# Patient Record
Sex: Female | Born: 1985 | Race: White | Hispanic: No | Smoking: Current every day smoker
Health system: Southern US, Community
[De-identification: ages and names within clinical notes are randomized; demographics above are authoritative.]

## PROBLEM LIST (undated history)

## (undated) ENCOUNTER — Inpatient Hospital Stay (HOSPITAL_COMMUNITY): Payer: Self-pay

## (undated) DIAGNOSIS — K829 Disease of gallbladder, unspecified: Secondary | ICD-10-CM

## (undated) DIAGNOSIS — I10 Essential (primary) hypertension: Secondary | ICD-10-CM

## (undated) DIAGNOSIS — Z9889 Other specified postprocedural states: Secondary | ICD-10-CM

## (undated) DIAGNOSIS — B977 Papillomavirus as the cause of diseases classified elsewhere: Secondary | ICD-10-CM

## (undated) DIAGNOSIS — M2550 Pain in unspecified joint: Secondary | ICD-10-CM

## (undated) DIAGNOSIS — E282 Polycystic ovarian syndrome: Secondary | ICD-10-CM

## (undated) DIAGNOSIS — R7303 Prediabetes: Secondary | ICD-10-CM

## (undated) DIAGNOSIS — E559 Vitamin D deficiency, unspecified: Secondary | ICD-10-CM

## (undated) DIAGNOSIS — R0602 Shortness of breath: Secondary | ICD-10-CM

## (undated) DIAGNOSIS — M549 Dorsalgia, unspecified: Secondary | ICD-10-CM

## (undated) DIAGNOSIS — F32A Depression, unspecified: Secondary | ICD-10-CM

## (undated) DIAGNOSIS — O139 Gestational [pregnancy-induced] hypertension without significant proteinuria, unspecified trimester: Secondary | ICD-10-CM

## (undated) DIAGNOSIS — M1990 Unspecified osteoarthritis, unspecified site: Secondary | ICD-10-CM

## (undated) DIAGNOSIS — O10919 Unspecified pre-existing hypertension complicating pregnancy, unspecified trimester: Secondary | ICD-10-CM

## (undated) DIAGNOSIS — R112 Nausea with vomiting, unspecified: Secondary | ICD-10-CM

## (undated) DIAGNOSIS — F319 Bipolar disorder, unspecified: Secondary | ICD-10-CM

## (undated) DIAGNOSIS — F419 Anxiety disorder, unspecified: Secondary | ICD-10-CM

## (undated) DIAGNOSIS — L732 Hidradenitis suppurativa: Secondary | ICD-10-CM

## (undated) DIAGNOSIS — Z8489 Family history of other specified conditions: Secondary | ICD-10-CM

## (undated) DIAGNOSIS — F329 Major depressive disorder, single episode, unspecified: Secondary | ICD-10-CM

## (undated) DIAGNOSIS — K5900 Constipation, unspecified: Secondary | ICD-10-CM

## (undated) DIAGNOSIS — R1310 Dysphagia, unspecified: Secondary | ICD-10-CM

## (undated) HISTORY — DX: Prediabetes: R73.03

## (undated) HISTORY — DX: Dysphagia, unspecified: R13.10

## (undated) HISTORY — DX: Unspecified pre-existing hypertension complicating pregnancy, unspecified trimester: O10.919

## (undated) HISTORY — DX: Depression, unspecified: F32.A

## (undated) HISTORY — DX: Unspecified osteoarthritis, unspecified site: M19.90

## (undated) HISTORY — DX: Hidradenitis suppurativa: L73.2

## (undated) HISTORY — DX: Dorsalgia, unspecified: M54.9

## (undated) HISTORY — DX: Essential (primary) hypertension: I10

## (undated) HISTORY — DX: Shortness of breath: R06.02

## (undated) HISTORY — DX: Disease of gallbladder, unspecified: K82.9

## (undated) HISTORY — DX: Constipation, unspecified: K59.00

## (undated) HISTORY — DX: Vitamin D deficiency, unspecified: E55.9

## (undated) HISTORY — DX: Polycystic ovarian syndrome: E28.2

## (undated) HISTORY — DX: Major depressive disorder, single episode, unspecified: F32.9

## (undated) HISTORY — DX: Pain in unspecified joint: M25.50

## (undated) HISTORY — PX: CHOLECYSTECTOMY: SHX55

## (undated) HISTORY — DX: Morbid (severe) obesity due to excess calories: E66.01

## (undated) HISTORY — PX: EYE SURGERY: SHX253

---
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
## 2004-01-10 ENCOUNTER — Ambulatory Visit: Payer: Self-pay | Admitting: Obstetrics & Gynecology

## 2004-01-11 ENCOUNTER — Ambulatory Visit (HOSPITAL_COMMUNITY): Admission: RE | Admit: 2004-01-11 | Discharge: 2004-01-11 | Payer: Self-pay | Admitting: *Deleted

## 2004-01-24 ENCOUNTER — Ambulatory Visit: Payer: Self-pay | Admitting: Obstetrics & Gynecology

## 2004-02-21 ENCOUNTER — Ambulatory Visit: Payer: Self-pay | Admitting: Obstetrics & Gynecology

## 2004-03-04 DIAGNOSIS — E282 Polycystic ovarian syndrome: Secondary | ICD-10-CM

## 2004-03-08 ENCOUNTER — Ambulatory Visit: Payer: Self-pay | Admitting: Family Medicine

## 2004-03-22 ENCOUNTER — Inpatient Hospital Stay (HOSPITAL_COMMUNITY): Admission: AD | Admit: 2004-03-22 | Discharge: 2004-03-22 | Payer: Self-pay | Admitting: Obstetrics & Gynecology

## 2004-04-02 ENCOUNTER — Inpatient Hospital Stay (HOSPITAL_COMMUNITY): Admission: RE | Admit: 2004-04-02 | Discharge: 2004-04-02 | Payer: Self-pay | Admitting: Obstetrics and Gynecology

## 2004-04-03 ENCOUNTER — Inpatient Hospital Stay (HOSPITAL_COMMUNITY): Admission: AD | Admit: 2004-04-03 | Discharge: 2004-04-03 | Payer: Self-pay | Admitting: *Deleted

## 2004-06-20 ENCOUNTER — Emergency Department (HOSPITAL_COMMUNITY): Admission: EM | Admit: 2004-06-20 | Discharge: 2004-06-20 | Payer: Self-pay | Admitting: Emergency Medicine

## 2004-10-13 ENCOUNTER — Inpatient Hospital Stay (HOSPITAL_COMMUNITY): Admission: AD | Admit: 2004-10-13 | Discharge: 2004-10-13 | Payer: Self-pay | Admitting: Obstetrics

## 2004-12-24 ENCOUNTER — Inpatient Hospital Stay (HOSPITAL_COMMUNITY): Admission: AD | Admit: 2004-12-24 | Discharge: 2004-12-24 | Payer: Self-pay | Admitting: Obstetrics & Gynecology

## 2004-12-25 ENCOUNTER — Inpatient Hospital Stay (HOSPITAL_COMMUNITY): Admission: AD | Admit: 2004-12-25 | Discharge: 2004-12-29 | Payer: Self-pay | Admitting: Obstetrics

## 2005-01-30 ENCOUNTER — Ambulatory Visit (HOSPITAL_COMMUNITY): Admission: RE | Admit: 2005-01-30 | Discharge: 2005-01-30 | Payer: Self-pay | Admitting: Obstetrics & Gynecology

## 2005-02-23 ENCOUNTER — Inpatient Hospital Stay (HOSPITAL_COMMUNITY): Admission: AD | Admit: 2005-02-23 | Discharge: 2005-02-27 | Payer: Self-pay | Admitting: Obstetrics & Gynecology

## 2005-05-09 ENCOUNTER — Emergency Department (HOSPITAL_COMMUNITY): Admission: EM | Admit: 2005-05-09 | Discharge: 2005-05-09 | Payer: Self-pay | Admitting: Emergency Medicine

## 2005-05-11 ENCOUNTER — Emergency Department (HOSPITAL_COMMUNITY): Admission: EM | Admit: 2005-05-11 | Discharge: 2005-05-12 | Payer: Self-pay | Admitting: Emergency Medicine

## 2005-05-12 ENCOUNTER — Emergency Department (HOSPITAL_COMMUNITY): Admission: EM | Admit: 2005-05-12 | Discharge: 2005-05-13 | Payer: Self-pay | Admitting: Emergency Medicine

## 2005-05-27 ENCOUNTER — Emergency Department (HOSPITAL_COMMUNITY): Admission: EM | Admit: 2005-05-27 | Discharge: 2005-05-27 | Payer: Self-pay | Admitting: Family Medicine

## 2005-05-28 ENCOUNTER — Emergency Department (HOSPITAL_COMMUNITY): Admission: EM | Admit: 2005-05-28 | Discharge: 2005-05-28 | Payer: Self-pay | Admitting: Family Medicine

## 2005-07-04 ENCOUNTER — Inpatient Hospital Stay (HOSPITAL_COMMUNITY): Admission: AD | Admit: 2005-07-04 | Discharge: 2005-07-04 | Payer: Self-pay | Admitting: Obstetrics

## 2005-08-05 ENCOUNTER — Ambulatory Visit: Payer: Self-pay | Admitting: Family Medicine

## 2005-10-29 ENCOUNTER — Emergency Department (HOSPITAL_COMMUNITY): Admission: EM | Admit: 2005-10-29 | Discharge: 2005-10-29 | Payer: Self-pay | Admitting: Emergency Medicine

## 2006-03-14 ENCOUNTER — Emergency Department (HOSPITAL_COMMUNITY): Admission: EM | Admit: 2006-03-14 | Discharge: 2006-03-14 | Payer: Self-pay | Admitting: Emergency Medicine

## 2006-07-04 ENCOUNTER — Ambulatory Visit (HOSPITAL_COMMUNITY): Admission: RE | Admit: 2006-07-04 | Discharge: 2006-07-04 | Payer: Self-pay | Admitting: Emergency Medicine

## 2006-07-04 ENCOUNTER — Emergency Department (HOSPITAL_COMMUNITY): Admission: EM | Admit: 2006-07-04 | Discharge: 2006-07-04 | Payer: Self-pay | Admitting: Emergency Medicine

## 2006-07-05 ENCOUNTER — Inpatient Hospital Stay (HOSPITAL_COMMUNITY): Admission: EM | Admit: 2006-07-05 | Discharge: 2006-07-08 | Payer: Self-pay | Admitting: Family Medicine

## 2006-07-05 ENCOUNTER — Encounter (INDEPENDENT_AMBULATORY_CARE_PROVIDER_SITE_OTHER): Payer: Self-pay | Admitting: Specialist

## 2006-08-24 ENCOUNTER — Emergency Department (HOSPITAL_COMMUNITY): Admission: EM | Admit: 2006-08-24 | Discharge: 2006-08-25 | Payer: Self-pay | Admitting: Emergency Medicine

## 2006-10-03 ENCOUNTER — Ambulatory Visit: Payer: Self-pay | Admitting: Family Medicine

## 2006-10-03 LAB — CONVERTED CEMR LAB: Beta hcg, urine, semiquantitative: NEGATIVE

## 2006-10-04 ENCOUNTER — Inpatient Hospital Stay (HOSPITAL_COMMUNITY): Admission: AD | Admit: 2006-10-04 | Discharge: 2006-10-04 | Payer: Self-pay | Admitting: Obstetrics and Gynecology

## 2006-10-20 ENCOUNTER — Encounter (INDEPENDENT_AMBULATORY_CARE_PROVIDER_SITE_OTHER): Payer: Self-pay | Admitting: Family Medicine

## 2006-10-20 ENCOUNTER — Ambulatory Visit: Payer: Self-pay | Admitting: Sports Medicine

## 2006-10-20 DIAGNOSIS — F410 Panic disorder [episodic paroxysmal anxiety] without agoraphobia: Secondary | ICD-10-CM

## 2006-10-20 LAB — CONVERTED CEMR LAB
AST: 13 units/L (ref 0–37)
Albumin: 4.4 g/dL (ref 3.5–5.2)
Alkaline Phosphatase: 76 units/L (ref 39–117)
FSH: 5.3 milliintl units/mL
MCHC: 32.9 g/dL (ref 30.0–36.0)
Platelets: 249 10*3/uL (ref 150–400)
Potassium: 4.2 meq/L (ref 3.5–5.3)
RDW: 14.1 % — ABNORMAL HIGH (ref 11.5–14.0)
Sodium: 141 meq/L (ref 135–145)
Total Bilirubin: 0.4 mg/dL (ref 0.3–1.2)
Total Protein: 7.4 g/dL (ref 6.0–8.3)

## 2006-10-21 ENCOUNTER — Telehealth (INDEPENDENT_AMBULATORY_CARE_PROVIDER_SITE_OTHER): Payer: Self-pay | Admitting: Family Medicine

## 2006-10-23 ENCOUNTER — Emergency Department (HOSPITAL_COMMUNITY): Admission: EM | Admit: 2006-10-23 | Discharge: 2006-10-23 | Payer: Self-pay | Admitting: Emergency Medicine

## 2006-10-27 ENCOUNTER — Telehealth: Payer: Self-pay | Admitting: *Deleted

## 2006-10-28 ENCOUNTER — Ambulatory Visit: Payer: Self-pay | Admitting: Family Medicine

## 2006-10-28 ENCOUNTER — Encounter (INDEPENDENT_AMBULATORY_CARE_PROVIDER_SITE_OTHER): Payer: Self-pay | Admitting: *Deleted

## 2006-10-28 ENCOUNTER — Ambulatory Visit (HOSPITAL_COMMUNITY): Admission: RE | Admit: 2006-10-28 | Discharge: 2006-10-28 | Payer: Self-pay | Admitting: Family Medicine

## 2006-10-30 ENCOUNTER — Ambulatory Visit: Payer: Self-pay | Admitting: Family Medicine

## 2006-11-24 ENCOUNTER — Ambulatory Visit: Payer: Self-pay | Admitting: Sports Medicine

## 2006-12-05 ENCOUNTER — Emergency Department (HOSPITAL_COMMUNITY): Admission: EM | Admit: 2006-12-05 | Discharge: 2006-12-05 | Payer: Self-pay | Admitting: Family Medicine

## 2006-12-15 ENCOUNTER — Emergency Department (HOSPITAL_COMMUNITY): Admission: EM | Admit: 2006-12-15 | Discharge: 2006-12-15 | Payer: Self-pay | Admitting: Emergency Medicine

## 2006-12-16 ENCOUNTER — Ambulatory Visit: Payer: Self-pay | Admitting: Family Medicine

## 2006-12-16 ENCOUNTER — Telehealth (INDEPENDENT_AMBULATORY_CARE_PROVIDER_SITE_OTHER): Payer: Self-pay | Admitting: *Deleted

## 2006-12-16 ENCOUNTER — Encounter: Payer: Self-pay | Admitting: Family Medicine

## 2007-01-06 ENCOUNTER — Encounter: Payer: Self-pay | Admitting: *Deleted

## 2007-01-28 ENCOUNTER — Emergency Department (HOSPITAL_COMMUNITY): Admission: EM | Admit: 2007-01-28 | Discharge: 2007-01-28 | Payer: Self-pay | Admitting: Family Medicine

## 2007-02-02 ENCOUNTER — Emergency Department (HOSPITAL_COMMUNITY): Admission: EM | Admit: 2007-02-02 | Discharge: 2007-02-02 | Payer: Self-pay | Admitting: Emergency Medicine

## 2007-02-23 ENCOUNTER — Emergency Department (HOSPITAL_COMMUNITY): Admission: EM | Admit: 2007-02-23 | Discharge: 2007-02-23 | Payer: Self-pay | Admitting: Emergency Medicine

## 2007-03-25 ENCOUNTER — Emergency Department (HOSPITAL_COMMUNITY): Admission: EM | Admit: 2007-03-25 | Discharge: 2007-03-25 | Payer: Self-pay | Admitting: Emergency Medicine

## 2007-03-27 ENCOUNTER — Ambulatory Visit: Payer: Self-pay | Admitting: Family Medicine

## 2007-03-27 LAB — CONVERTED CEMR LAB: Beta hcg, urine, semiquantitative: NEGATIVE

## 2007-04-27 ENCOUNTER — Emergency Department (HOSPITAL_COMMUNITY): Admission: EM | Admit: 2007-04-27 | Discharge: 2007-04-27 | Payer: Self-pay | Admitting: Family Medicine

## 2007-04-28 ENCOUNTER — Telehealth: Payer: Self-pay | Admitting: *Deleted

## 2007-04-29 ENCOUNTER — Emergency Department (HOSPITAL_COMMUNITY): Admission: EM | Admit: 2007-04-29 | Discharge: 2007-04-29 | Payer: Self-pay | Admitting: Family Medicine

## 2007-04-30 ENCOUNTER — Ambulatory Visit: Payer: Self-pay | Admitting: Family Medicine

## 2007-04-30 ENCOUNTER — Telehealth: Payer: Self-pay | Admitting: *Deleted

## 2007-05-06 ENCOUNTER — Encounter (INDEPENDENT_AMBULATORY_CARE_PROVIDER_SITE_OTHER): Payer: Self-pay | Admitting: Family Medicine

## 2007-05-06 ENCOUNTER — Ambulatory Visit: Payer: Self-pay | Admitting: Family Medicine

## 2007-05-06 LAB — CONVERTED CEMR LAB
CO2: 26 meq/L (ref 19–32)
Glucose, Bld: 82 mg/dL (ref 70–99)
Potassium: 4.2 meq/L (ref 3.5–5.3)
Sodium: 141 meq/L (ref 135–145)

## 2007-05-07 ENCOUNTER — Encounter (INDEPENDENT_AMBULATORY_CARE_PROVIDER_SITE_OTHER): Payer: Self-pay | Admitting: Family Medicine

## 2007-05-27 ENCOUNTER — Ambulatory Visit: Payer: Self-pay | Admitting: Family Medicine

## 2007-05-27 LAB — CONVERTED CEMR LAB: Beta hcg, urine, semiquantitative: NEGATIVE

## 2007-06-01 ENCOUNTER — Ambulatory Visit: Payer: Self-pay | Admitting: Sports Medicine

## 2007-06-02 ENCOUNTER — Encounter (INDEPENDENT_AMBULATORY_CARE_PROVIDER_SITE_OTHER): Payer: Self-pay | Admitting: Family Medicine

## 2007-06-02 ENCOUNTER — Ambulatory Visit: Payer: Self-pay | Admitting: Family Medicine

## 2007-06-22 ENCOUNTER — Telehealth: Payer: Self-pay | Admitting: *Deleted

## 2007-06-26 ENCOUNTER — Encounter (INDEPENDENT_AMBULATORY_CARE_PROVIDER_SITE_OTHER): Payer: Self-pay | Admitting: Family Medicine

## 2007-06-26 ENCOUNTER — Ambulatory Visit: Payer: Self-pay | Admitting: Family Medicine

## 2007-07-03 ENCOUNTER — Emergency Department (HOSPITAL_COMMUNITY): Admission: EM | Admit: 2007-07-03 | Discharge: 2007-07-03 | Payer: Self-pay | Admitting: Family Medicine

## 2007-08-03 ENCOUNTER — Ambulatory Visit: Payer: Self-pay | Admitting: Family Medicine

## 2007-08-03 ENCOUNTER — Telehealth: Payer: Self-pay | Admitting: *Deleted

## 2007-08-12 ENCOUNTER — Ambulatory Visit: Payer: Self-pay | Admitting: Family Medicine

## 2007-08-12 LAB — CONVERTED CEMR LAB: Beta hcg, urine, semiquantitative: NEGATIVE

## 2007-08-14 ENCOUNTER — Encounter: Payer: Self-pay | Admitting: *Deleted

## 2007-08-20 ENCOUNTER — Ambulatory Visit: Payer: Self-pay | Admitting: Family Medicine

## 2007-08-27 ENCOUNTER — Ambulatory Visit: Payer: Self-pay | Admitting: Family Medicine

## 2007-08-27 ENCOUNTER — Encounter (INDEPENDENT_AMBULATORY_CARE_PROVIDER_SITE_OTHER): Payer: Self-pay | Admitting: Family Medicine

## 2007-08-27 LAB — CONVERTED CEMR LAB
Calcium: 9.3 mg/dL (ref 8.4–10.5)
Glucose, Bld: 72 mg/dL (ref 70–99)
Potassium: 3.8 meq/L (ref 3.5–5.3)
Sodium: 142 meq/L (ref 135–145)

## 2007-08-31 ENCOUNTER — Encounter (INDEPENDENT_AMBULATORY_CARE_PROVIDER_SITE_OTHER): Payer: Self-pay | Admitting: Family Medicine

## 2007-09-21 ENCOUNTER — Telehealth: Payer: Self-pay | Admitting: *Deleted

## 2007-09-22 ENCOUNTER — Ambulatory Visit: Payer: Self-pay | Admitting: Family Medicine

## 2007-09-22 LAB — CONVERTED CEMR LAB: Beta hcg, urine, semiquantitative: NEGATIVE

## 2007-10-10 ENCOUNTER — Emergency Department (HOSPITAL_COMMUNITY): Admission: EM | Admit: 2007-10-10 | Discharge: 2007-10-11 | Payer: Self-pay | Admitting: Emergency Medicine

## 2007-10-12 ENCOUNTER — Encounter: Payer: Self-pay | Admitting: Family Medicine

## 2007-10-12 ENCOUNTER — Ambulatory Visit: Payer: Self-pay | Admitting: Family Medicine

## 2007-10-12 ENCOUNTER — Telehealth: Payer: Self-pay | Admitting: *Deleted

## 2007-10-12 LAB — CONVERTED CEMR LAB
Bilirubin Urine: NEGATIVE
Blood in Urine, dipstick: NEGATIVE
Ketones, urine, test strip: NEGATIVE
Nitrite: NEGATIVE
Specific Gravity, Urine: >=1.03
pH: 6

## 2007-10-13 LAB — CONVERTED CEMR LAB
ALT: 16 U/L
AST: 15 U/L
Albumin: 4.3 g/dL
Alkaline Phosphatase: 57 U/L
BUN: 11 mg/dL
CO2: 24 meq/L
Calcium: 9.2 mg/dL
Chloride: 106 meq/L
Creatinine, Ser: 0.71 mg/dL
Glucose, Bld: 80 mg/dL
Potassium: 3.9 meq/L
Sodium: 141 meq/L
Total Bilirubin: 0.3 mg/dL
Total Protein: 7.2 g/dL

## 2007-10-19 ENCOUNTER — Ambulatory Visit: Payer: Self-pay | Admitting: Family Medicine

## 2007-10-19 ENCOUNTER — Telehealth: Payer: Self-pay | Admitting: *Deleted

## 2007-10-26 ENCOUNTER — Encounter: Payer: Self-pay | Admitting: *Deleted

## 2007-11-07 ENCOUNTER — Emergency Department (HOSPITAL_COMMUNITY): Admission: EM | Admit: 2007-11-07 | Discharge: 2007-11-07 | Payer: Self-pay | Admitting: Emergency Medicine

## 2007-11-13 ENCOUNTER — Ambulatory Visit: Payer: Self-pay | Admitting: Family Medicine

## 2007-12-09 ENCOUNTER — Telehealth (INDEPENDENT_AMBULATORY_CARE_PROVIDER_SITE_OTHER): Payer: Self-pay | Admitting: *Deleted

## 2007-12-11 ENCOUNTER — Encounter: Payer: Self-pay | Admitting: *Deleted

## 2007-12-29 ENCOUNTER — Ambulatory Visit: Payer: Self-pay | Admitting: Family Medicine

## 2007-12-29 ENCOUNTER — Telehealth: Payer: Self-pay | Admitting: *Deleted

## 2007-12-29 LAB — CONVERTED CEMR LAB: Beta hcg, urine, semiquantitative: NEGATIVE

## 2008-03-07 ENCOUNTER — Encounter (INDEPENDENT_AMBULATORY_CARE_PROVIDER_SITE_OTHER): Payer: Self-pay | Admitting: Family Medicine

## 2008-03-07 ENCOUNTER — Ambulatory Visit: Payer: Self-pay | Admitting: Family Medicine

## 2008-03-07 LAB — CONVERTED CEMR LAB
ALT: 13 units/L (ref 0–35)
AST: 12 units/L (ref 0–37)
CO2: 23 meq/L (ref 19–32)
Calcium: 9.6 mg/dL (ref 8.4–10.5)
Chloride: 105 meq/L (ref 96–112)
Cholesterol: 125 mg/dL (ref 0–200)
Creatinine, Ser: 0.79 mg/dL (ref 0.40–1.20)
Potassium: 4.6 meq/L (ref 3.5–5.3)
Sodium: 140 meq/L (ref 135–145)
Total CHOL/HDL Ratio: 3.9
Total Protein: 7.5 g/dL (ref 6.0–8.3)

## 2008-03-08 ENCOUNTER — Encounter (INDEPENDENT_AMBULATORY_CARE_PROVIDER_SITE_OTHER): Payer: Self-pay | Admitting: Family Medicine

## 2008-03-21 ENCOUNTER — Telehealth (INDEPENDENT_AMBULATORY_CARE_PROVIDER_SITE_OTHER): Payer: Self-pay | Admitting: Family Medicine

## 2008-03-27 ENCOUNTER — Emergency Department (HOSPITAL_COMMUNITY): Admission: EM | Admit: 2008-03-27 | Discharge: 2008-03-27 | Payer: Self-pay | Admitting: Emergency Medicine

## 2008-04-07 ENCOUNTER — Telehealth: Payer: Self-pay | Admitting: *Deleted

## 2008-05-08 ENCOUNTER — Emergency Department (HOSPITAL_COMMUNITY): Admission: EM | Admit: 2008-05-08 | Discharge: 2008-05-09 | Payer: Self-pay | Admitting: Emergency Medicine

## 2008-05-09 ENCOUNTER — Ambulatory Visit (HOSPITAL_COMMUNITY): Admission: RE | Admit: 2008-05-09 | Discharge: 2008-05-09 | Payer: Self-pay | Admitting: Emergency Medicine

## 2008-05-10 ENCOUNTER — Telehealth (INDEPENDENT_AMBULATORY_CARE_PROVIDER_SITE_OTHER): Payer: Self-pay | Admitting: Family Medicine

## 2008-05-10 ENCOUNTER — Ambulatory Visit: Payer: Self-pay | Admitting: Family Medicine

## 2008-05-10 ENCOUNTER — Encounter: Payer: Self-pay | Admitting: Family Medicine

## 2008-05-10 ENCOUNTER — Encounter: Admission: RE | Admit: 2008-05-10 | Discharge: 2008-05-10 | Payer: Self-pay | Admitting: Family Medicine

## 2008-05-10 LAB — CONVERTED CEMR LAB
Band Neutrophils: 0 % (ref 0–10)
Basophils Absolute: 0 10*3/uL (ref 0.0–0.1)
Beta hcg, urine, semiquantitative: NEGATIVE
HCT: 43 % (ref 36.0–46.0)
Lymphocytes Relative: 23 % (ref 12–46)
Lymphs Abs: 1.9 10*3/uL (ref 0.7–4.0)
MCV: 85.8 fL (ref 78.0–100.0)
Monocytes Absolute: 0.4 10*3/uL (ref 0.1–1.0)
Neutro Abs: 5.7 10*3/uL (ref 1.7–7.7)
Platelets: 232 10*3/uL (ref 150–400)
RBC: 5.01 M/uL (ref 3.87–5.11)
WBC: 8.2 10*3/uL (ref 4.0–10.5)

## 2008-05-12 ENCOUNTER — Encounter: Payer: Self-pay | Admitting: Family Medicine

## 2008-05-13 ENCOUNTER — Ambulatory Visit: Payer: Self-pay | Admitting: Family Medicine

## 2008-05-13 ENCOUNTER — Encounter (INDEPENDENT_AMBULATORY_CARE_PROVIDER_SITE_OTHER): Payer: Self-pay | Admitting: Family Medicine

## 2008-05-13 ENCOUNTER — Telehealth (INDEPENDENT_AMBULATORY_CARE_PROVIDER_SITE_OTHER): Payer: Self-pay | Admitting: Family Medicine

## 2008-05-13 LAB — CONVERTED CEMR LAB
CMV IgM: <8 (ref <30.0)
EBV VCA IgG: 0.03
EBV VCA IgM: 0.33
Glucose, Urine, Semiquant: NEGATIVE
Heterophile Ab Screen: NEGATIVE
Nitrite: NEGATIVE
Specific Gravity, Urine: 1.02
pH: 7

## 2008-05-15 ENCOUNTER — Telehealth: Payer: Self-pay | Admitting: Family Medicine

## 2008-05-17 ENCOUNTER — Ambulatory Visit: Payer: Self-pay | Admitting: Family Medicine

## 2008-05-17 ENCOUNTER — Encounter (INDEPENDENT_AMBULATORY_CARE_PROVIDER_SITE_OTHER): Payer: Self-pay | Admitting: Family Medicine

## 2008-05-17 LAB — CONVERTED CEMR LAB
Basophils Relative: 1 % (ref 0–1)
Lymphocytes Relative: 25 % (ref 12–46)
Lymphs Abs: 2.1 10*3/uL (ref 0.7–4.0)
MCHC: 33.6 g/dL (ref 30.0–36.0)
Monocytes Relative: 5 % (ref 3–12)
Neutro Abs: 5.5 10*3/uL (ref 1.7–7.7)
Neutrophils Relative %: 66 % (ref 43–77)
RBC: 5.16 M/uL — ABNORMAL HIGH (ref 3.87–5.11)
WBC: 8.2 10*3/uL (ref 4.0–10.5)

## 2008-05-28 ENCOUNTER — Emergency Department (HOSPITAL_COMMUNITY): Admission: EM | Admit: 2008-05-28 | Discharge: 2008-05-28 | Payer: Self-pay | Admitting: Emergency Medicine

## 2008-06-01 ENCOUNTER — Encounter (INDEPENDENT_AMBULATORY_CARE_PROVIDER_SITE_OTHER): Payer: Self-pay | Admitting: Family Medicine

## 2008-06-01 ENCOUNTER — Ambulatory Visit: Payer: Self-pay | Admitting: Family Medicine

## 2008-06-01 LAB — CONVERTED CEMR LAB
Basophils Relative: 0 % (ref 0–1)
Lymphocytes Relative: 22 % (ref 12–46)
Lymphs Abs: 2.4 10*3/uL (ref 0.7–4.0)
Monocytes Relative: 5 % (ref 3–12)
Neutro Abs: 7.9 10*3/uL — ABNORMAL HIGH (ref 1.7–7.7)
Neutrophils Relative %: 71 % (ref 43–77)
RBC: 5.35 M/uL — ABNORMAL HIGH (ref 3.87–5.11)
WBC: 11.1 10*3/uL — ABNORMAL HIGH (ref 4.0–10.5)

## 2008-06-02 ENCOUNTER — Telehealth: Payer: Self-pay | Admitting: *Deleted

## 2008-06-07 ENCOUNTER — Ambulatory Visit: Payer: Self-pay | Admitting: Oncology

## 2008-06-08 ENCOUNTER — Encounter (INDEPENDENT_AMBULATORY_CARE_PROVIDER_SITE_OTHER): Payer: Self-pay | Admitting: Family Medicine

## 2008-06-08 ENCOUNTER — Ambulatory Visit: Payer: Self-pay | Admitting: Family Medicine

## 2008-06-08 LAB — CONVERTED CEMR LAB
Eosinophils Relative: 3 % (ref 0–5)
HCT: 41.4 % (ref 36.0–46.0)
Hemoglobin: 13.7 g/dL (ref 12.0–15.0)
Lymphocytes Relative: 23 % (ref 12–46)
Lymphs Abs: 2.6 10*3/uL (ref 0.7–4.0)
Platelets: 233 10*3/uL (ref 150–400)
RBC: 5.18 M/uL — ABNORMAL HIGH (ref 3.87–5.11)
WBC: 11.5 10*3/uL — ABNORMAL HIGH (ref 4.0–10.5)

## 2008-06-09 ENCOUNTER — Encounter: Payer: Self-pay | Admitting: *Deleted

## 2008-06-14 ENCOUNTER — Telehealth (INDEPENDENT_AMBULATORY_CARE_PROVIDER_SITE_OTHER): Payer: Self-pay | Admitting: Family Medicine

## 2008-06-15 ENCOUNTER — Ambulatory Visit: Payer: Self-pay | Admitting: Family Medicine

## 2008-06-15 ENCOUNTER — Encounter (INDEPENDENT_AMBULATORY_CARE_PROVIDER_SITE_OTHER): Payer: Self-pay | Admitting: Family Medicine

## 2008-06-16 ENCOUNTER — Emergency Department (HOSPITAL_COMMUNITY): Admission: EM | Admit: 2008-06-16 | Discharge: 2008-06-17 | Payer: Self-pay | Admitting: Emergency Medicine

## 2008-06-17 LAB — CONVERTED CEMR LAB
ALT: 15 units/L (ref 0–35)
CO2: 23 meq/L (ref 19–32)
Calcium: 9 mg/dL (ref 8.4–10.5)
Chloride: 109 meq/L (ref 96–112)
Lipase: 27 units/L (ref 0–75)
Lymphs Abs: 1.8 10*3/uL (ref 0.7–4.0)
MCV: 81 fL (ref 78.0–100.0)
Monocytes Relative: 6 % (ref 3–12)
Neutro Abs: 5.8 10*3/uL (ref 1.7–7.7)
Neutrophils Relative %: 70 % (ref 43–77)
Potassium: 4.4 meq/L (ref 3.5–5.3)
RBC: 5.11 M/uL (ref 3.87–5.11)
Sodium: 141 meq/L (ref 135–145)
Total Protein: 6.8 g/dL (ref 6.0–8.3)
WBC: 8.3 10*3/uL (ref 4.0–10.5)

## 2008-06-20 ENCOUNTER — Telehealth (INDEPENDENT_AMBULATORY_CARE_PROVIDER_SITE_OTHER): Payer: Self-pay | Admitting: Family Medicine

## 2008-06-23 ENCOUNTER — Ambulatory Visit: Payer: Self-pay | Admitting: Family Medicine

## 2008-06-28 ENCOUNTER — Encounter (INDEPENDENT_AMBULATORY_CARE_PROVIDER_SITE_OTHER): Payer: Self-pay | Admitting: Family Medicine

## 2008-06-28 LAB — COMPREHENSIVE METABOLIC PANEL
BUN: 10 mg/dL (ref 6–23)
CO2: 30 mEq/L (ref 19–32)
Calcium: 9.5 mg/dL (ref 8.4–10.5)
Chloride: 106 mEq/L (ref 96–112)
Creatinine, Ser: 0.74 mg/dL (ref 0.40–1.20)
Glucose, Bld: 72 mg/dL (ref 70–99)
Total Bilirubin: 0.5 mg/dL (ref 0.3–1.2)

## 2008-06-28 LAB — CBC & DIFF AND RETIC
BASO%: 0.4 % (ref 0.0–2.0)
Basophils Absolute: 0 10*3/uL (ref 0.0–0.1)
EOS%: 1.9 % (ref 0.0–7.0)
HCT: 42.1 % (ref 34.8–46.6)
LYMPH%: 19.2 % (ref 14.0–49.7)
MCH: 27.5 pg (ref 25.1–34.0)
MCHC: 34.7 g/dL (ref 31.5–36.0)
MCV: 79.3 fL — ABNORMAL LOW (ref 79.5–101.0)
MONO%: 4.1 % (ref 0.0–14.0)
NEUT%: 74.4 % (ref 38.4–76.8)
lymph#: 2.4 10*3/uL (ref 0.9–3.3)

## 2008-06-28 LAB — HAPTOGLOBIN: Haptoglobin: 209 mg/dL — ABNORMAL HIGH (ref 16–200)

## 2008-06-28 LAB — CHCC SMEAR

## 2008-06-28 LAB — LACTATE DEHYDROGENASE: LDH: 127 U/L (ref 94–250)

## 2008-06-29 ENCOUNTER — Telehealth: Payer: Self-pay | Admitting: Family Medicine

## 2008-06-30 ENCOUNTER — Encounter (INDEPENDENT_AMBULATORY_CARE_PROVIDER_SITE_OTHER): Payer: Self-pay | Admitting: Family Medicine

## 2008-06-30 ENCOUNTER — Ambulatory Visit: Payer: Self-pay | Admitting: Family Medicine

## 2008-06-30 LAB — CONVERTED CEMR LAB: Crystals, Fluid: NONE SEEN

## 2008-07-03 ENCOUNTER — Telehealth: Payer: Self-pay | Admitting: Family Medicine

## 2008-07-03 ENCOUNTER — Emergency Department (HOSPITAL_COMMUNITY): Admission: EM | Admit: 2008-07-03 | Discharge: 2008-07-03 | Payer: Self-pay | Admitting: Emergency Medicine

## 2008-07-05 ENCOUNTER — Ambulatory Visit: Payer: Self-pay | Admitting: Family Medicine

## 2008-07-05 LAB — CONVERTED CEMR LAB
Beta hcg, urine, semiquantitative: NEGATIVE
Bilirubin Urine: NEGATIVE
Ketones, urine, test strip: NEGATIVE
Nitrite: NEGATIVE
Specific Gravity, Urine: 1.025

## 2008-07-06 ENCOUNTER — Encounter: Admission: RE | Admit: 2008-07-06 | Discharge: 2008-07-06 | Payer: Self-pay | Admitting: Family Medicine

## 2008-07-07 ENCOUNTER — Telehealth (INDEPENDENT_AMBULATORY_CARE_PROVIDER_SITE_OTHER): Payer: Self-pay | Admitting: Family Medicine

## 2008-07-20 ENCOUNTER — Ambulatory Visit: Payer: Self-pay | Admitting: Family Medicine

## 2008-07-20 ENCOUNTER — Encounter (INDEPENDENT_AMBULATORY_CARE_PROVIDER_SITE_OTHER): Payer: Self-pay | Admitting: Family Medicine

## 2008-07-20 ENCOUNTER — Other Ambulatory Visit: Admission: RE | Admit: 2008-07-20 | Discharge: 2008-07-20 | Payer: Self-pay | Admitting: Family Medicine

## 2008-07-20 LAB — CONVERTED CEMR LAB
Beta hcg, urine, semiquantitative: NEGATIVE
Chlamydia, DNA Probe: NEGATIVE
GC Probe Amp, Genital: NEGATIVE
TSH: 2.366 microintl units/mL (ref 0.350–4.500)
Testosterone: 52.55 ng/dL (ref 10–70)

## 2008-07-25 ENCOUNTER — Telehealth: Payer: Self-pay | Admitting: *Deleted

## 2008-07-25 ENCOUNTER — Encounter (INDEPENDENT_AMBULATORY_CARE_PROVIDER_SITE_OTHER): Payer: Self-pay | Admitting: Family Medicine

## 2008-07-26 ENCOUNTER — Telehealth (INDEPENDENT_AMBULATORY_CARE_PROVIDER_SITE_OTHER): Payer: Self-pay | Admitting: Family Medicine

## 2008-07-27 ENCOUNTER — Ambulatory Visit: Payer: Self-pay | Admitting: Family Medicine

## 2008-07-27 DIAGNOSIS — F450 Somatization disorder: Secondary | ICD-10-CM

## 2008-07-27 LAB — CONVERTED CEMR LAB
Bilirubin Urine: NEGATIVE
Hgb A1c MFr Bld: 4.8 %
Urobilinogen, UA: 0.2

## 2008-08-26 ENCOUNTER — Ambulatory Visit: Payer: Self-pay | Admitting: Family Medicine

## 2008-08-30 ENCOUNTER — Encounter: Payer: Self-pay | Admitting: Family Medicine

## 2008-09-23 ENCOUNTER — Ambulatory Visit: Payer: Self-pay | Admitting: Family Medicine

## 2008-09-23 LAB — CONVERTED CEMR LAB: Beta hcg, urine, semiquantitative: NEGATIVE

## 2008-09-27 ENCOUNTER — Ambulatory Visit: Payer: Self-pay | Admitting: Oncology

## 2008-10-03 ENCOUNTER — Ambulatory Visit: Payer: Self-pay | Admitting: Family Medicine

## 2008-10-03 ENCOUNTER — Telehealth: Payer: Self-pay | Admitting: Family Medicine

## 2008-10-25 ENCOUNTER — Encounter: Payer: Self-pay | Admitting: Family Medicine

## 2008-10-25 ENCOUNTER — Ambulatory Visit: Payer: Self-pay | Admitting: Family Medicine

## 2008-10-27 ENCOUNTER — Telehealth: Payer: Self-pay | Admitting: *Deleted

## 2008-10-27 LAB — CONVERTED CEMR LAB
Platelets: 259 10*3/uL (ref 150–400)
RDW: 14 % (ref 11.5–15.5)
WBC: 13.1 10*3/uL — ABNORMAL HIGH (ref 4.0–10.5)

## 2008-10-28 ENCOUNTER — Emergency Department (HOSPITAL_COMMUNITY): Admission: EM | Admit: 2008-10-28 | Discharge: 2008-10-28 | Payer: Self-pay | Admitting: Emergency Medicine

## 2008-11-02 ENCOUNTER — Emergency Department (HOSPITAL_COMMUNITY): Admission: EM | Admit: 2008-11-02 | Discharge: 2008-11-03 | Payer: Self-pay | Admitting: Emergency Medicine

## 2008-11-22 ENCOUNTER — Ambulatory Visit: Payer: Self-pay | Admitting: Family Medicine

## 2008-11-22 ENCOUNTER — Telehealth: Payer: Self-pay | Admitting: Family Medicine

## 2008-11-23 ENCOUNTER — Emergency Department (HOSPITAL_COMMUNITY): Admission: EM | Admit: 2008-11-23 | Discharge: 2008-11-23 | Payer: Self-pay | Admitting: Emergency Medicine

## 2008-11-24 ENCOUNTER — Ambulatory Visit: Payer: Self-pay | Admitting: Family Medicine

## 2008-11-24 ENCOUNTER — Ambulatory Visit (HOSPITAL_COMMUNITY): Admission: RE | Admit: 2008-11-24 | Discharge: 2008-11-24 | Payer: Self-pay | Admitting: Family Medicine

## 2008-11-24 ENCOUNTER — Encounter: Payer: Self-pay | Admitting: Family Medicine

## 2008-12-02 ENCOUNTER — Encounter: Payer: Self-pay | Admitting: Family Medicine

## 2008-12-18 ENCOUNTER — Emergency Department (HOSPITAL_COMMUNITY): Admission: EM | Admit: 2008-12-18 | Discharge: 2008-12-18 | Payer: Self-pay | Admitting: Emergency Medicine

## 2009-02-01 ENCOUNTER — Encounter (INDEPENDENT_AMBULATORY_CARE_PROVIDER_SITE_OTHER): Payer: Self-pay

## 2009-02-15 ENCOUNTER — Emergency Department (HOSPITAL_COMMUNITY): Admission: EM | Admit: 2009-02-15 | Discharge: 2009-02-15 | Payer: Self-pay | Admitting: Emergency Medicine

## 2009-02-17 ENCOUNTER — Encounter: Payer: Self-pay | Admitting: Family Medicine

## 2009-02-17 ENCOUNTER — Ambulatory Visit: Payer: Self-pay | Admitting: Family Medicine

## 2009-02-17 LAB — CONVERTED CEMR LAB
ALT: 102 units/L — ABNORMAL HIGH (ref 0–35)
BUN: 11 mg/dL (ref 6–23)
Basophils Relative: 4 % — ABNORMAL HIGH (ref 0–1)
Bilirubin Urine: NEGATIVE
Blood in Urine, dipstick: NEGATIVE
CO2: 22 meq/L (ref 19–32)
Calcium: 9 mg/dL (ref 8.4–10.5)
Chloride: 105 meq/L (ref 96–112)
Creatinine, Ser: 0.85 mg/dL (ref 0.40–1.20)
Eosinophils Absolute: 0.1 10*3/uL (ref 0.0–0.7)
Eosinophils Relative: 1 % (ref 0–5)
Glucose, Urine, Semiquant: NEGATIVE
HCT: 41.9 % (ref 36.0–46.0)
Ketones, urine, test strip: NEGATIVE
Lymphs Abs: 6 10*3/uL — ABNORMAL HIGH (ref 0.7–4.0)
MCHC: 32.9 g/dL (ref 30.0–36.0)
MCV: 83.3 fL (ref 78.0–100.0)
Monocytes Relative: 14 % — ABNORMAL HIGH (ref 3–12)
Neutrophils Relative %: 24 % — ABNORMAL LOW (ref 43–77)
Platelets: 163 10*3/uL (ref 150–400)
Total Bilirubin: 0.5 mg/dL (ref 0.3–1.2)
pH: 6

## 2009-02-27 ENCOUNTER — Ambulatory Visit: Payer: Self-pay | Admitting: Family Medicine

## 2009-02-27 LAB — CONVERTED CEMR LAB
Albumin: 4.5 g/dL (ref 3.5–5.2)
BUN: 9 mg/dL (ref 6–23)
CO2: 24 meq/L (ref 19–32)
Calcium: 9.3 mg/dL (ref 8.4–10.5)
Chloride: 107 meq/L (ref 96–112)
Glucose, Bld: 81 mg/dL (ref 70–99)
HCV Ab: NEGATIVE
Hemoglobin: 14.3 g/dL (ref 12.0–15.0)
Hepatitis B Surface Ag: NEGATIVE
Potassium: 4.1 meq/L (ref 3.5–5.3)
RBC: 5.18 M/uL — ABNORMAL HIGH (ref 3.87–5.11)
Rapid Strep: NEGATIVE
Sodium: 142 meq/L (ref 135–145)
TSH: 2.342 microintl units/mL (ref 0.350–4.500)
Total Protein: 7.3 g/dL (ref 6.0–8.3)
WBC: 7.2 10*3/uL (ref 4.0–10.5)

## 2009-02-28 ENCOUNTER — Telehealth: Payer: Self-pay | Admitting: Family Medicine

## 2009-03-06 ENCOUNTER — Encounter (INDEPENDENT_AMBULATORY_CARE_PROVIDER_SITE_OTHER): Payer: Self-pay | Admitting: *Deleted

## 2009-03-06 DIAGNOSIS — Z720 Tobacco use: Secondary | ICD-10-CM | POA: Insufficient documentation

## 2009-03-06 DIAGNOSIS — F17200 Nicotine dependence, unspecified, uncomplicated: Secondary | ICD-10-CM

## 2009-03-19 ENCOUNTER — Telehealth: Payer: Self-pay | Admitting: Family Medicine

## 2009-04-06 ENCOUNTER — Telehealth: Payer: Self-pay | Admitting: Family Medicine

## 2009-05-09 ENCOUNTER — Telehealth: Payer: Self-pay | Admitting: *Deleted

## 2009-06-02 ENCOUNTER — Telehealth: Payer: Self-pay | Admitting: Family Medicine

## 2009-06-05 ENCOUNTER — Encounter: Payer: Self-pay | Admitting: *Deleted

## 2009-10-19 ENCOUNTER — Ambulatory Visit: Payer: Self-pay | Admitting: Family Medicine

## 2009-10-19 ENCOUNTER — Encounter: Payer: Self-pay | Admitting: Family Medicine

## 2009-10-19 ENCOUNTER — Inpatient Hospital Stay (HOSPITAL_COMMUNITY)
Admission: AD | Admit: 2009-10-19 | Discharge: 2009-10-19 | Payer: Self-pay | Source: Home / Self Care | Admitting: Family Medicine

## 2009-10-19 LAB — CONVERTED CEMR LAB
GC Probe Amp, Genital: NEGATIVE
Glucose, Urine, Semiquant: NEGATIVE
Specific Gravity, Urine: >=1.03
Urobilinogen, UA: 0.2
pH: 5.5

## 2009-10-26 ENCOUNTER — Encounter: Payer: Self-pay | Admitting: Family Medicine

## 2009-10-27 ENCOUNTER — Telehealth: Payer: Self-pay | Admitting: Family Medicine

## 2009-10-28 ENCOUNTER — Ambulatory Visit: Payer: Self-pay | Admitting: Advanced Practice Midwife

## 2009-10-28 ENCOUNTER — Inpatient Hospital Stay (HOSPITAL_COMMUNITY)
Admission: AD | Admit: 2009-10-28 | Discharge: 2009-10-28 | Payer: Self-pay | Source: Home / Self Care | Admitting: Obstetrics & Gynecology

## 2009-11-07 ENCOUNTER — Ambulatory Visit: Payer: Self-pay | Admitting: Family Medicine

## 2009-11-08 ENCOUNTER — Encounter: Payer: Self-pay | Admitting: Family Medicine

## 2009-11-08 ENCOUNTER — Ambulatory Visit: Payer: Self-pay | Admitting: Family Medicine

## 2009-11-09 ENCOUNTER — Telehealth: Payer: Self-pay | Admitting: Family Medicine

## 2009-11-21 ENCOUNTER — Ambulatory Visit: Payer: Self-pay | Admitting: Family Medicine

## 2009-11-21 ENCOUNTER — Encounter: Payer: Self-pay | Admitting: Family Medicine

## 2009-11-21 LAB — CONVERTED CEMR LAB
HCT: 39.6 % (ref 36.0–46.0)
Hemoglobin: 13.8 g/dL (ref 12.0–15.0)
MCHC: 34.8 g/dL (ref 30.0–36.0)
Platelets: 185 10*3/uL (ref 150–400)
RDW: 14.1 % (ref 11.5–15.5)

## 2009-12-06 ENCOUNTER — Ambulatory Visit: Payer: Self-pay | Admitting: Family Medicine

## 2009-12-15 ENCOUNTER — Telehealth: Payer: Self-pay | Admitting: Family Medicine

## 2009-12-21 ENCOUNTER — Ambulatory Visit: Payer: Self-pay | Admitting: Family Medicine

## 2009-12-21 ENCOUNTER — Encounter: Payer: Self-pay | Admitting: Family Medicine

## 2009-12-21 DIAGNOSIS — R030 Elevated blood-pressure reading, without diagnosis of hypertension: Secondary | ICD-10-CM | POA: Insufficient documentation

## 2009-12-21 LAB — CONVERTED CEMR LAB
BUN: 4 mg/dL — ABNORMAL LOW (ref 6–23)
CO2: 21 meq/L (ref 19–32)
Calcium: 8.8 mg/dL (ref 8.4–10.5)
Chloride: 108 meq/L (ref 96–112)
Creatinine, Ser: 0.47 mg/dL (ref 0.40–1.20)
HCT: 37.8 % (ref 36.0–46.0)
Hemoglobin: 13.4 g/dL (ref 12.0–15.0)
Protein, U semiquant: 30
RBC: 4.38 M/uL (ref 3.87–5.11)
Specific Gravity, Urine: >=1.03
Urobilinogen, UA: 1
WBC: 9.1 10*3/uL (ref 4.0–10.5)

## 2009-12-25 ENCOUNTER — Telehealth: Payer: Self-pay | Admitting: Family Medicine

## 2009-12-28 ENCOUNTER — Ambulatory Visit: Payer: Self-pay | Admitting: Family Medicine

## 2009-12-28 DIAGNOSIS — N72 Inflammatory disease of cervix uteri: Secondary | ICD-10-CM | POA: Insufficient documentation

## 2009-12-28 LAB — CONVERTED CEMR LAB: Chlamydia, DNA Probe: NEGATIVE

## 2009-12-29 ENCOUNTER — Encounter: Payer: Self-pay | Admitting: Family Medicine

## 2010-01-04 ENCOUNTER — Ambulatory Visit: Payer: Self-pay | Admitting: Family Medicine

## 2010-01-17 ENCOUNTER — Encounter: Payer: Self-pay | Admitting: Family Medicine

## 2010-01-17 ENCOUNTER — Ambulatory Visit: Payer: Self-pay | Admitting: Family Medicine

## 2010-01-18 ENCOUNTER — Observation Stay (HOSPITAL_COMMUNITY): Admission: RE | Admit: 2010-01-18 | Discharge: 2010-01-18 | Payer: Self-pay | Admitting: Obstetrics & Gynecology

## 2010-01-22 ENCOUNTER — Ambulatory Visit: Payer: Self-pay | Admitting: Family Medicine

## 2010-01-22 ENCOUNTER — Inpatient Hospital Stay (HOSPITAL_COMMUNITY)
Admission: AD | Admit: 2010-01-22 | Discharge: 2010-01-22 | Payer: Self-pay | Source: Home / Self Care | Admitting: Obstetrics & Gynecology

## 2010-01-24 ENCOUNTER — Telehealth: Payer: Self-pay | Admitting: *Deleted

## 2010-01-29 ENCOUNTER — Telehealth: Payer: Self-pay | Admitting: Family Medicine

## 2010-01-30 ENCOUNTER — Inpatient Hospital Stay (HOSPITAL_COMMUNITY): Admission: AD | Admit: 2010-01-30 | Discharge: 2010-01-30 | Payer: Self-pay | Admitting: Obstetrics & Gynecology

## 2010-01-30 ENCOUNTER — Ambulatory Visit: Payer: Self-pay | Admitting: Family Medicine

## 2010-01-30 LAB — CONVERTED CEMR LAB
Bilirubin Urine: NEGATIVE
Glucose, Urine, Semiquant: NEGATIVE
Specific Gravity, Urine: >=1.03
Urobilinogen, UA: 0.2
pH: 6

## 2010-02-07 ENCOUNTER — Ambulatory Visit: Payer: Self-pay | Admitting: Family Medicine

## 2010-02-15 ENCOUNTER — Ambulatory Visit: Payer: Self-pay | Admitting: Family Medicine

## 2010-02-16 ENCOUNTER — Inpatient Hospital Stay (HOSPITAL_COMMUNITY)
Admission: RE | Admit: 2010-02-16 | Discharge: 2010-02-18 | Payer: Self-pay | Source: Home / Self Care | Attending: Obstetrics & Gynecology | Admitting: Obstetrics & Gynecology

## 2010-04-02 ENCOUNTER — Ambulatory Visit: Admission: RE | Admit: 2010-04-02 | Discharge: 2010-04-02 | Payer: Self-pay | Source: Home / Self Care

## 2010-04-02 LAB — CONVERTED CEMR LAB: Hemoglobin: 14.8 g/dL

## 2010-04-03 NOTE — Progress Notes (Signed)
Summary: triage  Phone Note Call from Patient Call back at Home Phone [phone]   Caller: Patient Summary of Call: Pt found out she is [redacted] weeks pregnant and takes Metformin and wants to know if she should continue to take it. Initial call taken by: Clydell Hakim,  June 02, 2009 9:03 AM  Follow-up for Phone Call        she felt bad yesterday & went to ED. 2 wk preg. high bps. takes metformin 500mg  daily. wants to know if she should stop  she now lives in [place]. urged her to find an OB/GYN now & get an appt.  told her I will call her when md responds Follow-up by: Golden Circle RN,  June 02, 2009 9:10 AM  Additional Follow-up for Phone Call Additional follow up Details #1::        I think she should stop it until she finds an OB (although, it is category B; so probably OK).   Additional Follow-up by: Asher Muir MD,  June 02, 2009 12:29 PM    Additional Follow-up for Phone Call Additional follow up Details #2::    gave her above message. she has an appt with ob on the 15th Follow-up by: Golden Circle RN,  June 02, 2009 12:36 PM

## 2010-04-03 NOTE — Progress Notes (Signed)
Summary: triage  Phone Note Call from Patient Call back at Home Phone [phone]   Caller: Patient Summary of Call: 37 wk ob and wants to take [facility] - wants to know if this is OK Initial call taken by: De Nurse,  January 24, 2010 1:57 PM  Follow-up for Phone Call        Symptoms sound like a sinus infection.  No cough, just pressure in sinuses and behind her eyes.  Says that when she blows her nose it is dark green.  Told her I would ask the preceptor and call her back. Follow-up by: Dennison Nancy RN,  January 24, 2010 2:14 PM  Additional Follow-up for Phone Call Additional follow up Details #1::        Spoke with Dr. Perley Jain.  Sudafed is not indicated in the 3rd trimester but she can use Afrin nasal spray.  Also told her that if the pain is still present early next week she may need antibiotic.  She has an OB appt on Tuesday so we will evaluate it then. Additional Follow-up by: Dennison Nancy RN,  January 24, 2010 2:29 PM

## 2010-04-03 NOTE — Assessment & Plan Note (Signed)
Summary: 38.2 wk ob    Vital Signs:  Patient profile:   25 year old female Weight:      244 pounds Temp:     98.3 degrees F oral Pulse rate:   90 / minute Pulse rhythm:   regular BP sitting:   129 / 85  (left arm) Cuff size:   large  Vitals Entered By: Loralee Pacas CMA (January 30, 2010 9:02 AM)  Primary Care Provider:  Renold Don MD  CC:  contractions.  History of Present Illness: Pt comes in today with labor symptoms (contractions) and swelling in her hands a feet. She has a h/o pre-eclapmsia during her previous pregnancy. She is now seeing black spots and has been feeling contractions for the last 2 days. She did not time the contractions. She saw a little bit of blood yesterday and has been having a little white discharge but no rush of fluid.   Habits & Providers  Alcohol-Tobacco-Diet     Cigarette Packs/Day: 1/4 ppd  Current Medications (verified): 1)  None  Allergies (verified): 1)  ! Hydrocodone  Social History: Packs/Day:  1/4 ppd  Physical Exam  Genitalia:  fingertip/50%/-3   Impression & Recommendations:  Problem # 1:  PREGNANCY (ICD-V22.2) Assessment Deteriorated Pt was having contractions all night. Says that she had some bleeding and discharge yesterday. Sterile exam revealed a cervix that was fintertip. However, FHT were 170's so patient was sent to the MAU for eval. Paperwork sent with pt. Paperwork has Orlando Penner name on it to be called in case of delivery.  UA: neg for protein. BP was slightly elevated but not in pre-eclampsia range.   Orders: Urinalysis-[facility] (00000) Medicaid OB visit - [facility] ([id number])  Patient Instructions: 1)  pt told to go to MAU and given paperwork (prenatal handouts) to take with her.    Orders Added: 1)  Urinalysis-[facility] [00000] 2)  Medicaid OB visit - [facility] [[id number]]      Flowsheet View for Follow-up Visit    Estimated weeks of       gestation:     38 2/7    Weight:     244    Blood pressure:   129 / 85    Urine  Protein:     negative    Urine Glucose:   negative    Urine Nitrite:     negative    Headache:     No    Nausea/vomiting:   No    Edema:     hands and feet    Vaginal bleeding:   yes    Vaginal discharge:   d/c    FHR:       170s    Fetal activity:     yes    Labor symptoms:   contractions x 2 days    Fetal position:     ??    Taking prenatal vits?   N    Smoking:     1/4 ppd    Next visit:     1 wk    Resident:     Clotilde Dieter    Preceptor:     [place]    Comment:     fingertip, 50%, -3    Flowsheet View for Follow-up Visit    Estimated weeks of       gestation:     38 2/7    Weight:     244    Blood pressure:   129 / 85  Urine protein:       negative    Urine glucose:    negative    Urine nitrite:     negative    Hx headache?     No    Nausea/vomiting?   No    Edema?     hands and feet    Bleeding?     yes    Leakage/discharge?   d/c    Fetal activity:       yes    Labor symptoms?   contractions x 2 days    FHR:       170s    Fetal position:      ??    Taking Vitamins?   N    Smoking PPD:   1/4 ppd    Comment:     fingertip, 50%, -3    Next visit:     1 wk    Resident:     Clotilde Dieter    Preceptor:     [place]   Laboratory Results   Urine Tests  Date/Time Received: January 30, 2010 9:45 AM  Date/Time Reported: January 30, 2010 9:50 AM   Routine Urinalysis   Color: yellow Appearance: Clear Glucose: negative   (Normal Range: Negative) Bilirubin: negative   (Normal Range: Negative) Ketone: negative   (Normal Range: Negative) Spec. Gravity: >=1.030   (Normal Range: 1.003-1.035) Blood: negative   (Normal Range: Negative) pH: 6.0   (Normal Range: 5.0-8.0) Protein: negative   (Normal Range: Negative) Urobilinogen: 0.2   (Normal Range: 0-1) Nitrite: negative   (Normal Range: Negative) Leukocyte Esterace: negative   (Normal Range: Negative)    Comments: ...........test performed by...........Marland KitchenTerese Door, CMA

## 2010-04-03 NOTE — Letter (Signed)
Summary: Generic Letter  [facility]  [address]   [place], [place] [address]   Phone: [phone]  Fax: [phone]    10/19/2009  RAYLAN TROIANI [address] [place], [place]  [address]  To Whom it May Concern:  Ms Mattie is a pregnant patient due December 11.  She is receiving care here at [facility] under my supervision.     Sincerely,   Renold Don MD

## 2010-04-03 NOTE — Miscellaneous (Signed)
Summary: Pt moved to [facility]  Clinical Lists Changes

## 2010-04-03 NOTE — Letter (Signed)
Summary: Generic Letter  [facility]  [address]   [place], [place] [address]   Phone: [phone]  Fax: [phone]    12/29/2009  Mackenzie Gibson [address] [place], [place]  [address]  Dear [name],   I hope this letter finds you well.  The cultures that were done at your visit on October 27th were negative.    Please call if you have any questions or concerns.     Sincerely,   Paula Compton MD  Appended Document: Generic Letter mailed.

## 2010-04-03 NOTE — Assessment & Plan Note (Signed)
Summary: [name]/21 WEEKS/MOVED [place]   Vital Signs:  Patient profile:   25 year old female LMP:     05/07/2009 Weight:      237.1 pounds Temp:     98.5 degrees F oral Pulse rate:   96 / minute Pulse rhythm:   regular BP sitting:   126 / 83  (left arm) Cuff size:   large  Vitals Entered By: Loralee Pacas CMA (October 19, 2009 8:40 AM)  Primary Care Provider:  Asher Muir MD   History of Present Illness: 25 yo G2P1001 at 24 weeks who moved to [place] in January of this year to be with her fiancee.  She became pregnant in March of this year.  Mackenzie Gibson jailed, patient moved here July 31 via [employer] bus.  Parents and family here.    Problems thus far with pregnancy:  Working at [employer], had very bad cramps, did U/S, found that she had previa and put on bedrest for two weeks in June, repeat US showed previa had resolved and she's been doing well since then.   Has had 2 UTIs, first in April, second in June treated with Macrobid.  States she thinks UTI may have returned, increased pelvic pressure along with increased frequency.      Has not taken prenatal vitamins in a 1 month.    Of note, patient reports bleeding since moving back here in July 31.  Cramping if tries to stand, relieved when laying down.  Two panty liners in day due to bleeding.    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 Hydrocodone  Past History:  Past Medical History: G1P1, daughter delivered 66' 3oz vaginally at [facility], 2006. PCOS diagnosis by Penne Lash 2006 - Endrocrinologist - [facility]) - due to see 10/08 Panic disorder Trichomonas - 2002 Hx of monoarticular knee swelling - right knee 5/10 that resolved Bipolar Disorder  Social History: Lives in [place].  Moved to [place] end of 2010 to be with fiancee, became pregnant.  Different father from previous child.  Mackenzie Gibson went to jail and she moved back to [place] to be with family October 01, 2009.  Occupation: None, looking for work Married Current smoker 1/2 pack per day x 9 months.  Alcohol use-no Drug use-no Regular exercise-yes-exercise DVD Hepatitis Risk:  no Packs/Day:  2 cigarettes daily  Physical Exam  General:  Vital signs reviewed. Well-developed, well-nourished patient in NAD.  Awake and cooperative  Mouth:  fair dentition.  Oral mucosa pink and moist Lungs:  clear to auscultation bilaterally without wheezing, rales, or rhonchi.  Normal work of breathing  Heart:  Regular rate and rhythm without murmur, rub, or gallop.  Normal S1/S2  Abdomen:  gravid, fundal height and FHTs appropriate for gestational  age, bowel sounds present in all four quadrants  Extremities:  No clubbing, cyanosis, edema, or deformity noted with normal full range of motion of all joints.   Psych:  Very tangential in cognition and speech.  good eye contact, not anxious appearing, and not depressed appearing.     Impression & Recommendations:  Problem # 1:  PREGNANCY (ICD-V22.2) Assessment New 25 yo G2P1001 at 23.[redacted] weeks GA by sure LMP.  Just moved back to area from [place], do not have any records.  8/18 Prenatal labs:  Hgb 14/Hct 40.3, Platelets 196, A+/Ab Neg, RPR NR, Rubella Imm,  [place] Neg, HIV NR 8/18 GC/Chlam both negative   Patient has had bleeding x [redacted] weeks along with pain.  Unable to differentiate whether pain occurs with bleeding or is separate.  Will send straight to MAU.  Obtained GC/Chlamydia cultures today.  Cervix somewhat friable, bled easily.  No blood from os.  Could be source of bleeding.  Patient also with UTI symptoms, which could be source of bleeding.  See below.   Orders: Pap Smear-[facility] ([address]-60454) GC/Chlamydia-[facility] ([id number]) Prenatal-[facility] ([id number]) HIV-[facility] ([id number]) Sickle Cell Scr-[facility] ([id number]) Other OB visit- [facility] (OBCK)  Problem # 2:  DYSURIA (ICD-788.1) Assessment: New Patient also states she has had increased urinary frequency for past 2 weeks.  This could be source of bleeding.  Obtained UA in clinic, but so many epithelial cells present it was not a good sample.  Will follow up with bleeding and symptoms with patient, will call next week and see if resolved.   Orders: Urinalysis-[facility] (00000) Urine Culture-[facility] ([id number])  Patient Instructions: 1)  Head straight to [facility]U at [facility]! 2)  We will get your labs today.   3)  Make a lab appointment for glucose test. 4)  Make an appt to see me in 2 weeks.  5)  It was good to meet you.   Flowsheet View for Follow-up Visit    Estimated weeks of       gestation:     23 4/7    Weight:     237.1    Blood pressure:   126 / 83    Urine protein:       30    Urine glucose:    negative    Urine nitrite:     negative    Hx headache?     few    Nausea/vomiting?   No    Edema?     0    Bleeding?     yes    Leakage/discharge?   no    Fetal activity:       yes    Labor symptoms?   no    Fundal height:      22.5    FHR:       140    Cx dilation:     0    Cx effacement:   0    Fetal station:     high    Taking Vitamins?   N    Smoking PPD:   2 cigarettes daily    Comment:     Plan to send to MAU for vaginal bleeding with some pain x 2 weeks    Next visit:     4 wk    Resident:      Gwendolyn Grant    Preceptor:     Link Snuffer Initial Intake Information    Positive HCG by: self    Race: Cliffton Asters  Marital status: Married    Occupation: unemployed  FOB Information    Husband/Father of baby: FOB adopted, unknown history   Menstrual History    LMP (date): 05/07/2009    EDC by LMP: 02/11/2010    Best Working EDC: 02/11/2010    LMP - Character: normal    LMP - Reliable? : Yes    Menarche: 10 years    Menses interval: irregular days    Menstrual flow 3 days    On BCP's at conception: no   Prenatal Visit    FOB name: FOB adopted, unknown history  EDC Confirmation:    New working [facility]: 02/11/2010    LMP reliable? Yes    Last menses onset (LMP) date: 05/07/2009    EDC by LMP: 02/11/2010   Past Pregnancy History    Gravida:     2    Term Births:     1    Premature Births:   0    Living Children:   1  Pregnancy # 1    Delivery date:     02/25/2005    Weeks Gestation:   38    Preterm labor:     no    Delivery type:     NSVD    Hours of labor:     24    Anesthesia type:     epidural    Infant Sex:     Female    Birth weight:     8.3 lbs    Name:     Mackenzie Gibson    Comments:     Induced via pitocin, episiotomy cut   Genetic History    Father of baby:   FOB adopted, unknown history     FOB Family Hx:     Unknown     Thalassemia:     mother: no    Neural tube defect:   mother: no    Down's Syndrome:   mother: no    Tay-Sachs:     mother: no    Sickle Cell Dz/Trait:   mother: no    Hemophilia:     mother: no    Muscular Dystrophy:   mother: no    Cystic Fibrosis:   mother: no    Huntington's Dz:   mother: no    Mental Retardation:   mother: no    Fragile X:     mother: no    Other Genetic or       Chromosomal Dz:   mother: no    Child with other       birth defect:     mother: no    > 3 spont. abortions:   mother: no    Hx of stillbirth:     mother: no  Infection Risk History    High Risk Hepatitis B: no    Immunized against Hepatitis B: yes     Exposure to TB: no    Patient with history of Genital Herpes: no    Sexual partner with history of Genital Herpes: no    History of STD (GC, Chlamydia, Syphilis, HPV): yes    Specific STD: Chlamydia    Rash, Viral, or Febrile Illness since LMP: no    Exposure to Cat Litter: no    Chicken Pox Immune Status: Hx of Disease: Immune    History of Parvovirus (Fifth Disease): no    Occupational Exposure to Children: none  Environmental Exposures    Chemical or other exposure: no  Additional Infection/Environmental Comments:    Mouth x-ray for abscess tooth, double shield used Chlamydia and trich at age 46, none since then.     Laboratory Results   Urine Tests  Date/Time Received: October 19, 2009 8:48 AM  Date/Time Reported: October 19, 2009 9:11 AM   Routine Urinalysis   Color: yellow Appearance: Clear Glucose: negative   (Normal Range: Negative) Bilirubin: small-icto negative   (Normal Range: Negative) Ketone: negative   (Normal Range: Negative) Spec. Gravity: >=1.030   (Normal Range: 1.003-1.035) Blood: negative   (Normal Range: Negative) pH: 5.5   (Normal Range: 5.0-8.0) Protein: 30   (Normal Range: Negative) Urobilinogen: 0.2   (Normal Range: 0-1) Nitrite: negative   (Normal Range: Negative) Leukocyte Esterace: trace   (Normal Range: Negative)  Urine Microscopic WBC/HPF: 5-10 RBC/HPF: rare Bacteria/HPF: 3+ Mucous/HPF: 3+ Epithelial/HPF: 10-20 Crystals/HPF: many calcium oxalate    Comments: ...........test performed by...........Marland KitchenTerese Door, CMA

## 2010-04-03 NOTE — Miscellaneous (Signed)
Summary: ROI  ROI   Imported By: Clydell Hakim 10/20/2009 14:43:04  _____________________________________________________________________  External Attachment:    Type:   Image     Comment:   External Document

## 2010-04-03 NOTE — Progress Notes (Signed)
Summary: RUQ pain  Phone Note Call from Patient   Caller: Patient Summary of Call: Pt c/o RUQ pain x 24hours.  Has some associated nausea, fever, and chills.  No emesis. States she has had a cholecystectomy in the past.  She is currently in [place].  Advised her to take motrin 600mg  every 6 hours as needed for the pain or tylenol 650mg  every 6 hours for fever.  If the pain is not improving, she should be evaluated by a physician.  She agreed with the plan.  She recently had CMET, Hep B, and Hep C drawn on 12 /27 which were all negative.   Initial call taken by: Marisue Ivan  MD,  March 19, 2009 1:36 PM

## 2010-04-03 NOTE — Progress Notes (Signed)
Summary: Triage  Phone Note Call from Patient Call back at Home Phone [phone]   Reason for Call: Talk to Nurse Summary of Call: pt thinks she may be in labor, she is 38 weeks Initial call taken by: Knox Royalty,  January 29, 2010 2:30 PM  Follow-up for Phone Call        spoke with pt- she states she started having light contractions last night, did not keep her awake, thought they were braxton hicks.  This morning contractions continued, getting more intense, hurting in her back and thighs.  Not sure how far apart the contractions are as she's been busy with her other children.  Advised pt to go to [facility] hosp now for labor check.  She states she needs to wait 2 hrs b/c she has to drop her daughter off at that time.  Discussed with dr. Mauricio Po- he advised pt should not wait 2 hours, [facility].  Advised pt of the above from Dr. Mauricio Po- to [facility], not wait 2 hrs. Follow-up by: Rochele Pages RN,  January 29, 2010 2:46 PM

## 2010-04-03 NOTE — Assessment & Plan Note (Signed)
Summary: OB 37 wks   Vital Signs:  Patient profile:   25 year old female Height:      68 inches Weight:      242.5 pounds Pulse rate:   91 / minute BP sitting:   121 / 84  (left arm) Cuff size:   large  Vitals Entered By: Tessie Fass CMA (January 22, 2010 9:04 AM)  Serial Vital Signs/Assessments:  Time      Position  BP       Pulse  Resp  Temp     By                     161/09                         Sarah Swazil[place] MD  CC: OB Visit   CC:  OB Visit.  Habits & Providers  Alcohol-Tobacco-Diet     Cigarette Packs/Day: 2-3 cigs a day  Current Medications (verified): 1)  Th Prenatal Vitamins 28-0.8 Mg Tabs (Prenatal Vit-Fe Fumarate-Fa)  Allergies: 1)  ! Hydrocodone  Social History: Packs/Day:  2-3 cigs a day   Impression & Recommendations:  Problem # 1:  PREGNANCY (ICD-V22.2)  Doing well.  Now term.  GBS negative.  Reviewed labor precautions and kick counts.  Reviewed preeclampsia precautions as pt reports h/o preeclampsia. Plans to breast feed (BF daughter for 2 weeks). Social situation not optimal - unable to afford vitamin (rx given for Medicaid to cover).  +Smokers in the house, but staying with friends.  Does have her parents across town for support.  FOP in jail.   Follow up 1 week with Dr. Gwendolyn Grant.  Orders: Medicaid OB visit - [facility] ([id number])  Problem # 2:  TOBACCO USER (ICD-305.1)  Advised cessation.  Pt has cut back  Orders: Medicaid OB visit - [facility] ([id number])  Complete Medication List: 1)  Th Prenatal Vitamins 28-0.8 Mg Tabs (Prenatal vit-fe fumarate-fa)  Patient Instructions: 1)  Things look great today.  2)  You can try to get the prenatal vitamins with the Medicaid and see if they cover the gel caps. 3)  If you have trouble with vaginal bleeding, contractions every 5 minutes for an hour, if you break your water or if [place] is not moving well, then please go to [facility] to been seen. 4)  Call us if you have any concerns. 5)  Please schedule an  appt in 1 week with Dr. Gwendolyn Grant Prescriptions: TH PRENATAL VITAMINS 28-0.8 MG TABS (PRENATAL VIT-FE FUMARATE-FA)   #100 x 1   Entered and Authorized by:   Sarah Swaziland MD   Signed by:   Sarah Swaziland MD on 01/22/2010   Method used:   Print then Give to Patient   RxID:   [id number]    Orders Added: 1)  Medicaid OB visit - [facility] [[id number]]     OB Initial Intake Information    Positive HCG by: self    Race: Undetermined    Marital status: Separated    Occupation: unemployed  FOB Information    Husband/Father of baby: FOB adopted, unknown history   Menstrual History    LMP (date): 05/07/2009    LMP - Character: normal    Menarche: 10 years    Menses interval: irregular days    Menstrual flow 3 days    On BCP's at conception: no   Flowsheet View for Follow-up Visit    Estimated weeks of  gestation:     37 1/7    Weight:     242.5    Blood pressure:   121 / 84    Headache:     No    Nausea/vomiting:   No    Edema:     hands    Vaginal bleeding:   no    Vaginal discharge:   d/c    Fundal height:      37    FHR:       130s    Fetal activity:     yes    Labor symptoms:   few ctx    Fetal position:     vertex by ultrasound    Cx Dilation:     1    Cx Effacement:   20%    Cx Station:     high    Taking prenatal vits?   N    Smoking:     2-3 cigs a day    Next visit:     1 wk  Prenatal Visit Concerns noted: Went for version, but was vertex.  Has had ctx for for few days, mostly at night.  Sometimes painful.  Pt has noted white splotches on arms today.  IVsite  still hurts.  Would very much like to be checked today to see if dilated. Pt living with friend and friend's boyfriend and their children.  +smokers in home.

## 2010-04-03 NOTE — Assessment & Plan Note (Signed)
Summary: OB/KH   Vital Signs:  Patient profile:   25 year old female Height:      68 inches Weight:      239 pounds Pulse rate:   86 / minute BP sitting:   116 / 67  (left arm) Cuff size:   regular  Vitals Entered By: Tessie Fass CMA (December 06, 2009 2:51 PM) CC: OB Visit   CC:  OB Visit.  Habits & Providers  Alcohol-Tobacco-Diet     Cigarette Packs/Day: 2 cigarettes  Current Problems (verified): 1)  Pregnancy  (ICD-V22.2) 2)  Tobacco User  (ICD-305.1) 3)  Liver Function Tests, Abnormal, Hx of  (ICD-V12.2) 4)  Weight Loss  (ICD-783.21) 5)  Dysuria  (ICD-788.1) 6)  Splenomegaly  (ICD-789.2) 7)  Somatization Disorder  (ICD-300.81) 8)  Metabolic Syndrome X  (ICD-277.7) 9)  Polycystic Ovarian Disease  (ICD-256.4) 10)  Obesity  (ICD-278.00) 11)  Vaginal Bleeding  (ICD-623.8) 12)  Panic Disorder  (ICD-300.01) 13)  Family History Diabetes 1st Degree Relative  (ICD-V18.0)  Current Medications (verified): 1)  Th Prenatal Vitamins 28-0.8 Mg Tabs (Prenatal Vit-Fe Fumarate-Fa)  Allergies (verified): 1)  ! Hydrocodone  Past History:  Past medical, surgical, family and social histories (including risk factors) reviewed, and no changes noted (except as noted below).  Past Medical History: Reviewed history from 10/19/2009 and no changes required. G1P1, daughter delivered 50' 3oz vaginally at [facility], 2006. PCOS diagnosis by Penne Lash 2006 - Endrocrinologist - [facility]) - due to see 10/08 Panic disorder Trichomonas - 2002 Hx of monoarticular knee swelling - right knee 5/10 that resolved Bipolar Disorder  Past Surgical History: Reviewed history from 10/20/2006 and no changes required. Cholecystectomy May '08, 2/2 obstructive cholecystitis. Eye surgery for "nystagmus" @ 6mos.  "tightened muscle"  Family History: Reviewed history from 11/24/2008 and no changes required. Family History Diabetes 1st degree relative Family History Kidney disease Family  History Other cancer - leukemia Family History Thyroid disease Family History Psychiatric care--aunt comitted suicide  Social History: Reviewed history from 10/19/2009 and no changes required. Lives in [place].  Moved to [place] end of 2010 to be with fiancee, became pregnant.  Different father from previous child.  [name] went to jail and she moved back to [place] to be with family October 01, 2009.  Occupation: None, looking for work Married Current smoker 1/2 pack per day x 9 months.  Alcohol use-no Drug use-no Regular exercise-yes-exercise DVD Packs/Day:  2 cigarettes  Review of Systems       no headaches, vision changes, chest pain, dyspnea, nausea/vomiting, changes in bowel habits, lower extremity edema   Physical Exam  General:  Vital signs reviewed. Well-developed, well-nourished patient in NAD.  Awake and cooperative  Mouth:  oral mucosa moist.  Fair dentition Abdomen:  gravid, fundal height and FHTs appropriate for gestational age, bowel sounds present in all four quadrants  Extremities:  No clubbing, cyanosis, edema, or deformity noted with normal full range of motion of all joints.  No edema noted BL LE   Impression & Recommendations: 25 yo G2P1001 at [redacted] weeks GA by sure LMP.  Moved here from [place], unable to obtain previous records.   EDC: 02/11/10 8/18 Prenatal labs:  Hgb 14/Hct 40.3, Platelets 196, A+/Ab Neg, RPR NR, Rubella Imm, [place] Neg, HIV NR 8/18 GC/Chlam both negative Per patient, placed on bedrest x 2 weeks for bleeding after previa found on [name] in [place].  Repeat US showed resolution. 8/18 [name]:  23.[redacted] weeks GA by [name].  No fetal abnormalitites.  Breech position.  Anterior placenta. 1 hour GTT: 137 3 hour GCT: 73-106-107-86 28 week labs:  Hgb 13.8, Platelets 185, HIV NR, RPR NR  No complaints today per patient.  Has gained back 7 lbs.  Still very active, walking daily for exercise.   Baby with good fundal height, good FHT's.  She will follow up with me in 2 weeks and  we will keep close eye on her weights.  To be seen in [facility] when she can be scheduled.  Complete Medication List: 1)  Th Prenatal Vitamins 28-0.8 Mg Tabs (Prenatal vit-fe fumarate-fa)  Other Orders: Other OB visit- [facility] ([facility])   OB Initial Intake Information    Positive HCG by: self    Race: Undetermined    Marital status: Separated    Occupation: unemployed  FOB Information    Husband/Father of baby: FOB adopted, unknown history   Menstrual History    LMP (date): 05/07/2009    LMP - Character: normal    Menarche: 10 years    Menses interval: irregular days    Menstrual flow 3 days    On BCP's at conception: no   Flowsheet View for Follow-up Visit    Estimated weeks of       gestation:     30 3/7    Weight:     239    Blood pressure:   116 / 67    Headache:     No    Nausea/vomiting:   No    Edema:     0    Vaginal bleeding:   no    Vaginal discharge:   no    Fundal height:      30    FHR:       128    Fetal activity:     yes    Labor symptoms:   no    Taking prenatal vits?   Y    Smoking:     2 cigarettes    Next visit:     2 wk    Resident:     Gwendolyn Grant    Comment:     Patient with 7 lb weight gain.  Still walking.  Will schedule for [facility] for Follow-up Visit    Estimated weeks of       gestation:     30 3/7    Weight:     239    Blood pressure:   116 / 67    Hx headache?     No    Nausea/vomiting?   No    Edema?     0    Bleeding?     no    Leakage/discharge?   no    Fetal activity:       yes    Labor symptoms?   no    Fundal height:      30    FHR:       128    Taking Vitamins?   Y    Smoking PPD:   2 cigarettes    Comment:     Patient with 7 lb weight gain.  Still walking.  Will schedule for OB Clinic    Next visit:     2 wk    Resident:     Gwendolyn Grant

## 2010-04-03 NOTE — Miscellaneous (Signed)
Summary: walkin  Clinical Lists Changes she wanted results of labs done. gave her results. she is still concerned as she continues to bleed thru 2 panty liners per day. told her I will notify pcp. states she has a routine visit next wk. will call her if pcp wants to see her sooner.Golden Circle RN  October 26, 2009 9:40 AM  will see her at appt.  Renold Don MD  October 30, 2009 9:57 AM

## 2010-04-03 NOTE — Assessment & Plan Note (Signed)
Summary: ob f/u resch'd from 9/2/[facility]   Vital Signs:  Patient profile:   25 year old female Weight:      232.6 pounds Temp:     98.4 degrees F oral Pulse rate:   91 / minute Pulse rhythm:   regular BP sitting:   118 / 79  (left arm) Cuff size:   large  Vitals Entered By: Loralee Pacas CMA (November 21, 2009 3:00 PM)  Primary Care Provider:  Renold Don MD  CC:  ROB.  History of Present Illness: Patient moved to [place] [facility] [place] for 1 week with her mom, has since returned.  Will be in [place] for duration of pregnancy.  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 Hydrocodone  Past History:  Past medical, surgical, family and social histories (including risk factors) reviewed, and no changes noted (except as noted below).  Past Medical History: Reviewed history from 10/19/2009 and no changes required. G1P1, daughter delivered 6' 3oz vaginally at [facility], 2006. PCOS diagnosis by Penne Lash 2006 - Endrocrinologist - [facility]) - due to see 10/08 Panic disorder Trichomonas - 2002 Hx of monoarticular knee swelling - right knee 5/10 that resolved Bipolar Disorder  Past Surgical History: Reviewed history from 10/20/2006 and no changes required. Cholecystectomy May '08, 2/2 obstructive cholecystitis. Eye surgery for "nystagmus" @ 6mos.   "tightened muscle"  Family History: Reviewed history from 11/24/2008 and no changes required. Family History Diabetes 1st degree relative Family History Kidney disease Family History Other cancer - leukemia Family History Thyroid disease Family History Psychiatric care--aunt comitted suicide  Social History: Reviewed history from 10/19/2009 and no changes required. Lives in [place].  Moved to [place] end of 2010 to be with fiancee, became pregnant.  Different father from previous child.  [name] went to jail and she moved back to [place] to be with family October 01, 2009.  Occupation: None, looking for work Married Current smoker 1/2 pack per day x 9 months.  Alcohol use-no Drug use-no Regular exercise-yes-exercise DVD  Review of Systems       no headaches, vision changes, chest pain, dyspnea, nausea/vomiting, changes in bowel habits, lower extremity edema   Physical Exam  General:  Vital signs reviewed. Well-developed, well-nourished patient in NAD.  Awake and cooperative  Mouth:  oral mucosa moist Abdomen:  gravid, fundal height and FHTs appropriate for gestational age, bowel sounds present in all four quadrants  Extremities:  No clubbing, cyanosis, edema, or deformity noted with normal full range of motion of all joints.     Impression & Recommendations:  Problem # 1:  PREGNANCY (ICD-V22.2) Assessment Comment Only  24 yo G2P1001 at 28.[redacted] weeks GA by sure LMP.  Moved here from [place], unable to obtain previous records.   EDC: 02/11/10 8/18 Prenatal labs:  Hgb 14/Hct 40.3, Platelets 196, A+/Ab Neg, RPR NR, Rubella Imm, [place] Neg, HIV NR 8/18 GC/Chlam  both negative Per patient, placed on bedrest x 2 weeks for bleeding after previa found on [name] in [place].  Repeat US showed resolution. 8/18 [name]:  23.[redacted] weeks GA by [name].  No fetal abnormalitites.  Breech position.  Anterior placenta. 1 hour GTT: 137 3 hour GCT: 73-106-107-86 28 week labs obtained today  Bleeding resolved.  Unable to  track down patient's previous OB records.  No complaints today per patient.  Only concern is that she has lost 5 lbs since last visit.  Discussed with Dr. Mauricio Po.  Patient was morbidly obese to begin with and admits to being much more active than before pregancy.  Endorses that she is eating well.  Baby with good fundal height, good FHT's.  She will follow up with me in 2 weeks and we will keep close eye on her weights.  To be seen in [facility] after next visit with me.   Orders: CBC-[facility] ([id number]) RPR-[facility] [phone]) HIV-[facility] ([id number]) Other OB visit- [facility] ([facility])  Problem # 2:  VAGINAL BLEEDING (ICD-623.8) Assessment: Improved Resolved.  No further episodes since visit to [facility].    Complete Medication List: 1)  Th Prenatal Vitamins 28-0.8 Mg Tabs (Prenatal vit-fe fumarate-fa)    Flowsheet View for Follow-up Visit    Estimated weeks of       gestation:     28 2/7    Weight:     232.6    Blood pressure:   118 / 79    Headache:     No    Nausea/vomiting:   No    Edema:     0    Vaginal bleeding:   no    Vaginal discharge:   no    Fundal height:      28    FHR:       135    Fetal activity:     yes    Labor symptoms:   no    Next visit:     4 wk    Resident:     Gwendolyn Grant    Preceptor:     Mauricio Po    Comment:     Has had 5 lbs weight loss.  See CPOE    Flowsheet View for Follow-up Visit    Estimated weeks of       gestation:     28 2/7    Weight:     232.6    Blood pressure:   118 / 79    Hx headache?     No    Nausea/vomiting?   No    Edema?     0    Bleeding?     no    Leakage/discharge?   no    Fetal activity:       yes    Labor symptoms?   no    Fundal height:      28    FHR:       135    Comment:     Has had 5 lbs weight loss.  See CPOE    Next visit:     4 wk    Resident:     Gwendolyn Grant    Preceptor:     Link Snuffer Initial Intake Information    Positive HCG by: self    Race: Undetermined    Marital status: Separated    Occupation: unemployed  FOB  Information    Husband/Father of baby: FOB adopted, unknown history  Menstrual History    LMP (date): 05/07/2009    LMP - Character: normal    Menarche: 10 years    Menses interval: irregular days    Menstrual flow 3 days    On BCP's at conception: no

## 2010-04-03 NOTE — Progress Notes (Signed)
Summary: Lab Res  Phone Note Call from Patient Call back at Home Phone [phone]   Caller: Patient Summary of Call: Pt checking on labs from yesterday. Initial call taken by: Clydell Hakim,  November 09, 2009 2:24 PM  Follow-up for Phone Call        will forward to MD. Follow-up by: Theresia Lo RN,  November 09, 2009 4:34 PM  Additional Follow-up for Phone Call Additional follow up Details #1::        called pt told her that the 3 hour glucola was normal.  Additional Follow-up by: Antoine Primas DO,  November 09, 2009 4:41 PM

## 2010-04-03 NOTE — Progress Notes (Signed)
Summary: [place]  Phone Note Call from Patient Call back at [phone]   Caller: Patient Summary of Call: Pt checking on labs from last week. Initial call taken by: Clydell Hakim,  December 25, 2009 8:55 AM  Follow-up for Phone Call        will forward to MD. Follow-up by: Theresia Lo RN,  December 25, 2009 11:19 AM  Additional Follow-up for Phone Call Additional follow up Details #1::        Called and spoke to patient about results of tests.  Asked about eating as patient has albumin 3.3 and low glucose as well as weight loss during pregnancy.  Patient states she continues to eat well, eating several meals throughout the day.  Pleased with lab results.  She is to fu at [facility] on Thursday of this week.   Additional Follow-up by: Renold Don MD,  December 26, 2009 9:11 AM

## 2010-04-03 NOTE — Progress Notes (Signed)
Summary: phn msg  Phone Note Call from Patient Call back at Home Phone [phone]   Caller: Patient Summary of Call: Wants to talk to Dr. Lafonda Mosses about the rx she perscribed for her.  She also will need another one for it, because she did not pick it up in time at the pharmacy. Initial call taken by: Clydell Hakim,  May 09, 2009 11:00 AM  Follow-up for Phone Call        sent 1 month supply.  she should schedule a follow up visit with me.  thanks Follow-up by: Asher Muir MD,  May 09, 2009 5:34 PM     Prescriptions: METFORMIN HCL 500 MG TABS (METFORMIN HCL) 1 tab by mouth in am for a week; then 1 tab by mouth two times a day  #60 x 0   Entered and Authorized by:   Asher Muir MD   Signed by:   Asher Muir MD on 05/09/2009   Method used:   Electronically to        [facility] [phone]* (retail)       [address]       [place], [place]  [address]       Ph: [phone]       Fax: [phone]   RxID:   [id number]   pt has lost some weight and would like to start taking the metformin again and would like for pcp to call in a rx to [facility] [address]. will forward to pcpTonya [facility] CMA  May 09, 2009 2:17 PM  called and left vm for pt to go pick up rx.Loralee Pacas CMA  May 10, 2009 9:17 AM

## 2010-04-03 NOTE — Assessment & Plan Note (Signed)
Summary: ob visit/eo   Vital Signs:  Patient profile:   25 year old female Weight:      242 pounds Temp:     98.4 degrees F oral Pulse rate:   112 / minute Pulse rhythm:   regular BP sitting:   121 / 81  (left arm) Cuff size:   large  Vitals Entered By: Loralee Pacas CMA (January 17, 2010 4:21 PM)   Habits & Providers  Alcohol-Tobacco-Diet     Cigarette Packs/Day: n/a  Current Problems (verified): 1)  Unspecified Nystagmus  (ICD-379.50) 2)  Cervicitis and Endocervicitis  (ICD-616.0) 3)  Elevated Bp Reading Without Dx Hypertension  (ICD-796.2) 4)  Pregnancy  (ICD-V22.2) 5)  Tobacco User  (ICD-305.1) 6)  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 Take One Twice Per Day For 7 Days  Allergies (verified): 1)  ! Hydrocodone  Past History:  Past medical, surgical, family and social histories (including risk factors) reviewed, and no changes noted (except as noted below).  Past Medical History: Reviewed history from 10/19/2009 and no changes required. G1P1, daughter delivered 80' 3oz vaginally at [facility], 2006. PCOS diagnosis by Penne Lash 2006 - Endrocrinologist - [facility]) - due to see 10/08 Panic disorder Trichomonas - 2002 Hx of monoarticular knee swelling - right knee 5/10 that resolved Bipolar Disorder  Past Surgical History: Reviewed history from 10/20/2006 and no changes required. Cholecystectomy May '08, 2/2 obstructive cholecystitis. Eye surgery for "nystagmus" @ 6mos.  "tightened muscle"  Family History: Reviewed history from 11/24/2008 and no changes required. Family History Diabetes 1st degree relative Family History Kidney disease Family History Other cancer - leukemia Family History Thyroid  disease Family History Psychiatric care--aunt comitted suicide  Social History: Reviewed history from 10/19/2009 and no changes required. Lives in [place].  Moved to [place] end of 2010 to be with fiancee, became pregnant.  Different father from previous child.  [name] went to jail and she moved back to [place] to be with family October 01, 2009.  Occupation: None, looking for work Married Current smoker 1/2 pack per day x 9 months.  Alcohol use-no Drug use-no Regular exercise-yes-exercise DVD  Review of Systems       no headaches, vision changes, chest pain, dyspnea, nausea/vomiting, changes in bowel habits, lower extremity edema   Physical Exam  General:  Vital signs reviewed. Well-developed, well-nourished patient in NAD.  Awake and cooperative  Abdomen:  gravid, fundal height and FHTs appropriate for gestational age, bowel sounds present in all four quadrants  Extremities:  trace LE edema   Impression & Recommendations:  Problem # 1:  PREGNANCY (ICD-V22.2) Patient continues to do well.  She is scheduled to talk with OB's tomorrow about external cephalic version as baby is transverse lie.  Otherwise, no changes to exam, history, or pregnancy course.  Obtained GBS today.  To fax to [facility] patient info.  FU in 1 week.   Orders: Grp B Probe-[facility] ([id number]) Medicaid OB visit [facility] ([id number])  Complete Medication List: 1)  Th Prenatal Vitamins 28-0.8 Mg Tabs (Prenatal vit-fe fumarate-fa) 2)  Flagyl 500 Mg Tabs (Metronidazole) .... Take one twice per day for 7 days   Orders Added: 1)  Grp B Probe-[facility] [[id number]] 2)  Medicaid OB visit - [facility] [[id number]]  Flowsheet View for Follow-up Visit    Estimated weeks of       gestation:     37 0/7    Weight:     242    Blood pressure:   121 / 81    Hx headache?     No    Nausea/vomiting?   No    Edema?     TrHnd    Bleeding?     no    Leakage/discharge?   no    Fetal activity:       yes    Labor symptoms?   few ctx    Fundal  height:      38    FHR:       140s    Taking Vitamins?   Y    Smoking PPD:   n/a    Comment:     FU in 1 week - also of note, on 11/3 there is incorrect information above, patient NOT bleeding on that date, recorded incorrectly.      Next visit:     1 wk    Resident:     Gwendolyn Grant

## 2010-04-03 NOTE — Assessment & Plan Note (Signed)
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 Take One Twice Per Day For 7 Days  Allergies (verified): 1)  ! Hydrocodone  Past History:  Past medical, surgical, family and social histories (including risk factors) reviewed, and no changes noted (except as noted below).  Past Medical History: Reviewed history from 10/19/2009 and no changes required. G1P1, daughter delivered 95' 3oz vaginally at [facility], 2006. PCOS diagnosis by Penne Lash 2006 - Endrocrinologist - [facility]) - due to see 10/08 Panic disorder Trichomonas - 2002 Hx of monoarticular knee swelling - right knee 5/10 that resolved Bipolar Disorder  Past Surgical History: Reviewed history from 10/20/2006 and no changes required. Cholecystectomy May '08, 2/2 obstructive cholecystitis. Eye surgery for "nystagmus" @ 6mos.  "tightened muscle"  Family History: Reviewed history from 11/24/2008 and no changes required. Family History Diabetes 1st degree relative Family History Kidney disease Family History Other cancer - leukemia Family History Thyroid disease Family History Psychiatric care--aunt comitted  suicide  Social History: Reviewed history from 10/19/2009 and no changes required. Lives in [place].  Moved to [place] end of 2010 to be with fiancee, became pregnant.  Different father from previous child.  [name] went to jail and she moved back to [place] to be with family October 01, 2009.  Occupation: None, looking for work Married Current smoker 1/2 pack per day x 9 months.  Alcohol use-no Drug use-no Regular exercise-yes-exercise DVD Packs/Day:  n/a  Review of Systems       no headaches, vision changes, chest pain, dyspnea, nausea/vomiting, changes in bowel habits, lower extremity edema   Physical Exam  General:  Vital signs reviewed. Well-developed, well-nourished patient in NAD.  Awake and cooperative  Abdomen:  gravid, fundal height and FHTs appropriate for gestational age, bowel sounds present in all four quadrants  Extremities:  trace LE edema noted   Impression & Recommendations:  Problem # 1:  PREGNANCY (ICD-V22.2) Assessment Unchanged  24 yo G2P1001 at 34.[redacted] weeks GA by sure LMP.  Moved here from [place], unable to obtain previous records.   EDC: 02/11/10 8/18 Prenatal labs:  Hgb 14/Hct 40.3, Platelets 196, A+/Ab Neg, RPR NR, Rubella Imm, Delmar Neg, HIV NR 8/18 GC/Chlam both negative Per patient, placed on bedrest x 2 weeks for bleeding after previa found on [name] in [place].  Repeat US showed resolution. 8/18 [name]:  23.[redacted] weeks GA by [name].  No fetal abnormalitites.  Breech position.  Anterior placenta. 1 hour GTT: 137 3 hour GCT: 73-106-107-86 28 week labs:  Hgb 13.8, Platelets 185, HIV NR, RPR NR  Pressures improved today.  Does not endorse any further headaches or vision changes.  Fetus transverse  lie on last US done at clinic. Will refer to [facility] for possible external cephalic version.  Continue to follow for any preeclampsia signs or symptoms.   Will follow up in 2 weeks.  Red flags and warnings given.    Orders: Obstetric Referral (Obstetric) Medicaid OB visit -  [facility] ([id number])  Problem # 2:  ELEVATED BP READING WITHOUT DX HYPERTENSION (ICD-796.2) Assessment: Improved As above  Complete Medication List: 1)  Th Prenatal Vitamins 28-0.8 Mg Tabs (Prenatal vit-fe fumarate-fa) 2)  Flagyl 500 Mg Tabs (Metronidazole) .... Take one twice per day for 7 days   Orders Added: 1)  Obstetric Referral [Obstetric] 2)  Medicaid OB visit - [facility] [[id number]]     OB Initial Intake Information    Positive HCG by: self    Race: Undetermined    Marital status: Separated    Occupation: unemployed  FOB Information    Husband/Father of baby: FOB adopted, unknown history   Menstrual History    LMP (date): 05/07/2009    LMP - Character: normal    Menarche: 10 years    Menses interval: irregular days    Menstrual flow 3 days    On BCP's at conception: no   Flowsheet View for Follow-up Visit    Estimated weeks of       gestation:     34 4/7    Weight:     240.2    Blood pressure:   110 / 82    Headache:     No    Nausea/vomiting:   No    Edema:     TrHnd    Vaginal bleeding:   yes    Vaginal discharge:   no    Fundal height:      35    FHR:       142    Fetal activity:     yes    Labor symptoms:   few ctx    Taking prenatal vits?   Y    Smoking:     n/a    Next visit:     2 wk    Resident:     Mendel [place]der [place] for Follow-up Visit    Estimated weeks of       gestation:     34 4/7    Weight:     240.2    Blood pressure:   110 / 82    Hx headache?     No    Nausea/vomiting?   No    Edema?     TrHnd    Bleeding?     yes    Leakage/discharge?   no    Fetal activity:       yes    Labor symptoms?   few ctx    Fundal height:      35    FHR:       142    Taking Vitamins?   Y    Smoking PPD:   n/a    Next visit:     2 wk    Resident:     Gwendolyn Grant

## 2010-04-03 NOTE — Assessment & Plan Note (Signed)
Summary: OB/KH   Vital Signs:  Patient profile:   25 year old female Height:      68 inches Weight:      238 pounds BMI:     36.32 Temp:     98 degrees F Pulse rate:   77 / minute BP sitting:   162 / 87  (left arm)  Vitals Entered By: Theresia Lo RN (December 21, 2009 1:46 PM)  Serial Vital Signs/Assessments:  Time      Position  BP       Pulse  Resp  Temp     By                     140/90                         Renold Don MD                     138/90                         Renold Don MD  Comments: Measured at exactly 140/90 and then 138/90 on 2 different occasions today in clinic.   By: Renold Don MD   CC: OB check up Is Patient Diabetic? No Pain Assessment Patient in pain? yes     Location: inner thighs Intensity: 7 Type: stretching sensation   CC:  OB check up.  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 Hydrocodone  Past History:  Past medical, surgical, family and social histories (including risk factors) reviewed, and no changes noted (except as noted below).  Past Medical History: Reviewed history from 10/19/2009 and no changes  required. G1P1, daughter delivered 41' 3oz vaginally at [facility], 2006. PCOS diagnosis by Penne Lash 2006 - Endrocrinologist - [facility]) - due to see 10/08 Panic disorder Trichomonas - 2002 Hx of monoarticular knee swelling - right knee 5/10 that resolved Bipolar Disorder  Past Surgical History: Reviewed history from 10/20/2006 and no changes required. Cholecystectomy May '08, 2/2 obstructive cholecystitis. Eye surgery for "nystagmus" @ 6mos.  "tightened muscle"  Family History: Reviewed history from 11/24/2008 and no changes required. Family History Diabetes 1st degree relative Family History Kidney disease Family History Other cancer - leukemia Family History Thyroid disease Family History Psychiatric care--aunt comitted suicide  Social History: Reviewed history from 10/19/2009 and no changes required. Lives in [place].  Moved to [place] end of 2010 to be with fiancee, became pregnant.  Different father from previous child.  [name] went to jail and she moved back to [place] to be with  family October 01, 2009.  Occupation: None, looking for work Married Current smoker 1/2 pack per day x 9 months.  Alcohol use-no Drug use-no Regular exercise-yes-exercise DVD Packs/Day:  0.25  Review of Systems       no headaches, vision changes, chest pain, dyspnea, nausea/vomiting, changes in bowel habits, lower extremity edema   Physical Exam  General:  Vital signs reviewed. Well-developed, well-nourished patient in NAD.  Awake and cooperative  Mouth:  dentition fair   Impression & Recommendations:  Problem # 1:  PREGNANCY (ICD-V22.2) Assessment Deteriorated  25 yo G2P1001 at [redacted] weeks GA by sure LMP.  Moved here from [place], unable to obtain previous records.   EDC: 02/11/10 8/18 Prenatal labs:  Hgb 14/Hct 40.3, Platelets 196, A+/Ab Neg, RPR NR, Rubella Imm, Pettit Neg, HIV NR 8/18 GC/Chlam both negative Per patient, placed on bedrest x 2 weeks for bleeding after previa  found on [name] in [place].  Repeat US showed resolution. 8/18 [name]:  23.[redacted] weeks GA by [name].  No fetal abnormalitites.  Breech position.  Anterior placenta. 1 hour GTT: 137 3 hour GCT: 73-106-107-86 28 week labs:  Hgb 13.8, Platelets 185, HIV NR, RPR NR  Concern for elevated pressures on multiple readings in clinic today.  No Headaches, nausea, vomiting, RUQ pain.  Does endorse 1 floater that is new since last visit, describes as black dot against white background she sees on computer screen.  Obtained preeclampsial labs today, may need to be transferred to [facility].  Will follow in 2 weeks otherwise.    Orders: Urine Protein Ratio- [facility] ([id number]) Urine Creatinine-[facility] [phone]) Comp Met-[facility] ([id number]) CBC-[facility] (47425) Urinalysis-[facility] (00000) Medicaid OB visit - [facility] ([id number])  Complete Medication List: 1)  Th Prenatal Vitamins 28-0.8 Mg Tabs (Prenatal vit-fe fumarate-fa)  Patient Instructions: 1)  If you have contractions every 10 minutes for longer than 1 hour, make sure you head straight to MAU.   2)  If you notice any vaginal bleeding, a big gush of fluid, or not feeling the baby move, call the clinic or head straight to MAU. 3)  Make sure you come back for [facility], you will like them there! 4)  Good to see you today, we will let you know about the bloodwork tests.     Orders Added: 1)  Urine Protein Ratio- [facility] [[id number]] 2)  Urine Creatinine-[facility] [[id number]] 3)  Comp Met-[facility] [[id number]] 4)  CBC-[facility] [85027] 5)  Urinalysis-[facility] [00000] 6)  Medicaid OB visit - [facility] [[id number]]     Flowsheet View for Follow-up Visit    Estimated weeks of       gestation:     32 4/7    Weight:     238    Blood pressure:   162 / 87    Urine protein:       30    Urine glucose:    negative    Urine nitrite:     negative    Hx headache?     No    Nausea/vomiting?   No    Edema?     0    Bleeding?     no    Leakage/discharge?   no    Fetal activity:       yes    Labor symptoms?   no    Fundal  height:      32.5    FHR:       140s    Taking Vitamins?   Jeannie Fend  Smoking PPD:   0.25    Next visit:     2 wk    Resident:     Gwendolyn Grant    Preceptor:     Chambliss    OB Initial Intake Information    Positive HCG by: self    Race: Undetermined    Marital status: Separated    Occupation: unemployed  FOB Information    Husband/Father of baby: FOB adopted, unknown history   Menstrual History    LMP (date): 05/07/2009    LMP - Character: normal    Menarche: 10 years    Menses interval: irregular days    Menstrual flow 3 days    On BCP's at conception: no    Laboratory Results   Urine Tests  Date/Time Received: December 21, 2009 2:48 PM   Routine Urinalysis   Color: orange Appearance: Clear Glucose: negative   (Normal Range: Negative) Bilirubin: small   (Normal Range: Negative) Ketone: trace (5)   (Normal Range: Negative) Spec. Gravity: >=1.030   (Normal Range: 1.003-1.035) Blood: negative   (Normal Range: Negative) pH: 6.0   (Normal Range: 5.0-8.0) Protein: 30   (Normal Range: Negative) Urobilinogen: 1.0   (Normal Range: 0-1) Nitrite: negative   (Normal Range: Negative) Leukocyte Esterace: moderate   (Normal Range: Negative)       Appended Document: Microscopic   Laboratory Results   Urine Tests  Date/Time Received: December 21, 2009 2:48 PM  Date/Time Reported: December 21, 2009 3:47 PM  Bilirubin: icto negative   (Normal Range: Negative)  Urine Microscopic WBC/HPF: 5-10 RBC/HPF: rare Bacteria/HPF: 2+ Mucous/HPF: 2+ Epithelial/HPF: 10-20 Crystals/HPF: 5-10 calcium oxalate Other: 1+ amorphous    Comments: ...........test performed by...........Marland KitchenTerese Door, CMA

## 2010-04-03 NOTE — Progress Notes (Signed)
Summary: triage   Phone Note Call from Patient Call back at [phone]   Caller: Patient Summary of Call: Pt dosen't feel good she is chily and cough. pt need to know if she can take tylenol Initial call taken by: Clydell Hakim,  December 15, 2009 11:11 AM  Follow-up for Phone Call        her husband answered & said he was not home but wanted to know if tylwenol was ok. told him it was. he will tell her when he gets back home Follow-up by: Golden Circle RN,  December 15, 2009 11:31 AM  Additional Follow-up for Phone Call Additional follow up Details #1::        Agree with plan.  If continues to have chills, she should be seen.   Additional Follow-up by: Renold Don MD,  December 17, 2009 10:31 AM

## 2010-04-03 NOTE — Assessment & Plan Note (Signed)
Summary: ob,df   Vital Signs:  Patient profile:   25 year old female Weight:      245 pounds Temp:     97.9 degrees F oral Pulse rate:   97 / minute Pulse rhythm:   regular BP sitting:   139 / 88  (left arm) Cuff size:   large  Serial Vital Signs/Assessments:  Time      Position  BP       Pulse  Resp  Temp     By                     128/88                         Renold Don MD   Primary Care Provider:  Renold Don MD   History of Present Illness: 1.  Vaginal bleeding:  Sent to MAU from clinic last week due to elevated HR.  Seen at MAU and sent home due to accelerations. Otherwise, no concerns or complaints.  Some vaginal bleeding yesterday, mucus filled clot that filled 1 pad and then stopped.  No lightheadedness.  No abdominal pain.  No further bleeding.    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 Hydrocodone  Past History:  Past medical, surgical, family and social histories (including risk factors) reviewed, and no changes noted (except as noted below).  Past Medical History: Reviewed history from 10/19/2009 and no changes required. G1P1, daughter delivered 21' 3oz vaginally at [facility], 2006. PCOS diagnosis by Penne Lash 2006 - Endrocrinologist - [facility]) - due to see 10/08 Panic disorder Trichomonas - 2002 Hx of monoarticular knee swelling - right knee 5/10 that resolved Bipolar Disorder  Past Surgical  History: Reviewed history from 10/20/2006 and no changes required. Cholecystectomy May '08, 2/2 obstructive cholecystitis. Eye surgery for "nystagmus" @ 6mos.  "tightened muscle"  Family History: Reviewed history from 11/24/2008 and no changes required. Family History Diabetes 1st degree relative Family History Kidney disease Family History Other cancer - leukemia Family History Thyroid disease Family History Psychiatric care--aunt comitted suicide  Social History: Reviewed history from 10/19/2009 and no changes required. Lives in [place].  Moved to [place] end of 2010 to be with fiancee, became pregnant.  Different father from previous child.  [name] went to jail and she moved back to [place] to be with family October 01, 2009.  Occupation: None, looking for work Married Current smoker 1/2 pack per day x 9 months.  Alcohol use-no Drug use-no Regular exercise-yes-exercise DVD  Review of Systems       no headaches, vision changes, chest pain, dyspnea, nausea/vomiting, changes in bowel habits.  Physical Exam  General:  Vital signs reviewed. Well-developed, well-nourished patient in NAD.  Awake and cooperative  Abdomen:  gravid, fundal height and FHTs appropriate for gestational age, bowel sounds present in all four quadrants  Genitalia:  normal introitus, no external lesions, no vaginal discharge, and mucosa  pink and moist.  No bleeding noted.  Cervical exam revealed 1 cm dilation, no blood.   Additional Exam:  Gen:  Vital signs reviewed.  Well-developed, well-nourished patient in NAD.  Awake and cooperative Abd:  gravid, fundal height and FHTs appropriate for gestational age, bowel sounds present in all four quadrants Ext:  No clubbing, cyanosis, edema, or deformity noted with normal full range of motion of all joints. Trace LE edema     Impression & Recommendations:  Problem # 1:  PREGNANCY (ICD-V22.2) Assessment Unchanged Patient continues to do well.  Blood pressure checks  within normal limits at second check.  Feel that vaginal bleeding was actually passage of mucus plug.  No pain, abruption a possibility though less likely.  Reviewed imaging reports, patient does not have previa present.    GBS negative.   To fu next week if has not delivered, will discuss induction at that point.   Orders: Medicaid OB visit - [facility] ([id number])  Complete Medication List: 1)  Th Prenatal Vitamins 28-0.8 Mg Tabs (Prenatal vit-fe fumarate-fa)  Patient Instructions: 1)  Follow up with me in 1 week if you are still pregnant.   2)  If you have any further vaginal bleeding or feel that you're water has broken, go to the MAU.   3)  We will schedule you for induction if I see you next week.     Orders Added: 1)  Medicaid OB visit - [facility] [[id number]]     Flowsheet View for Follow-up Visit    Estimated weeks of       gestation:     39 3/7    Weight:     245    Blood pressure:   139 / 88    Hx headache?     No    Nausea/vomiting?   No    Edema?     hands and feet    Bleeding?     yes    Leakage/discharge?   d/c    Fetal activity:       yes    Labor symptoms?   contractions x 2 days    Fetal position:      ??    Cx dilation:     1    Cx effacement:   50%    Fetal station:     high    Taking Vitamins?   N    Smoking PPD:   1/4 ppd    Comment:     Will schedule for induction if has not delivered by next visit.    Next visit:     1 wk    Resident:     Gwendolyn Grant    Prevention & Chronic Care Immunizations   Influenza vaccine: Not documented    Tetanus booster: Not documented    Pneumococcal vaccine: Not documented  Other Screening   Pap smear: NEGATIVE FOR INTRAEPITHELIAL LESIONS OR MALIGNANCY.  (10/19/2009)   Smoking status: current  (12/21/2009)

## 2010-04-03 NOTE — Progress Notes (Signed)
Summary: refill  Phone Note Call from Patient Call back at Home Phone [phone]   Caller: Patient Summary of Call: wants to start taking Metformin again [facility]  Initial call taken by: De Nurse,  April 06, 2009 2:12 PM  Follow-up for Phone Call        to pcp Follow-up by: Golden Circle RN,  April 06, 2009 2:29 PM  Additional Follow-up for Phone Call Additional follow up Details #1::        told her I will forward this to pcp & call her with response Additional Follow-up by: Golden Circle RN,  April 06, 2009 2:42 PM    Additional Follow-up for Phone Call Additional follow up Details #2::    I think that is a great idea.  she needs to come in and see more for a follow up visit as well.  [facility] [address] Follow-up by: Asher Muir MD,  April 06, 2009 3:36 PM  Additional Follow-up for Phone Call Additional follow up Details #3:: Additional Follow-up by: Golden Circle RN,  April 06, 2009 3:45 PM  New/Updated Medications: METFORMIN HCL 500 MG TABS (METFORMIN HCL) 1 tab by mouth in am for a week; then 1 tab by mouth two times a day Prescriptions: METFORMIN HCL 500 MG TABS (METFORMIN HCL) 1 tab by mouth in am for a week; then 1 tab by mouth two times a day  #60 x 0   Entered and Authorized by:   Asher Muir MD   Signed by:   Asher Muir MD on 04/06/2009   Method used:   Electronically to        [facility] [phone]* (retail)       [address]       [place], [place]  [address]       Ph: [phone]       Fax: [phone]   RxID:   [id number]  states she is now living in [place]. she will not be back for visits. told her we will not fill further rx for her. she is to find a doctor asap. states she can go to any [facility] in the [name] & they will be able to pull this rx up.Marland KitchenMarland KitchenGolden Circle RN  April 06, 2009 3:47 PM

## 2010-04-03 NOTE — Miscellaneous (Signed)
Summary: [name]  Clinical Lists Changes  Problems: Added new problem of [name] (ICD-305.1) 

## 2010-04-03 NOTE — Assessment & Plan Note (Signed)
Summary: OB/KH   Vital Signs:  Patient profile:   25 year old female Height:      68 inches Weight:      238 pounds BMI:     36.32 Pulse rate:   88 / minute BP sitting:   123 / 80  (left arm) Cuff size:   large  Vitals Entered By: Tessie Fass CMA (December 28, 2009 9:45 AM) CC: OB   CC:  OB.  Habits & Providers  Alcohol-Tobacco-Diet     Cigarette Packs/Day: 1/4 ppd  Allergies: 1)  ! Hydrocodone  Social History: Packs/Day:  1/4 ppd   Impression & Recommendations:  Problem # 1:  PREGNANCY (ICD-V22.2)  Pt is a 25 year old G2P1 with history of preeclampsia at 9 4/7 weeks today complaining of intermittent spotting and mild edema in her hands but not face.  She continues to smoke and continues to complain of floaters, mainly in her right eye, and nystagmas (these have been present for years, though they have worsened this pregnancy).  She had one incident recently with 4 hours of regular contractions, but these have resolved and she now only has irregular contractions.  Her blood pressure today is within the normal range and fetal growth appears to be adequate despite the fact that [place] has not been gaining weight.  Sterile speculum exam performed today demonstrated no blood and minimal discharge. Ultrasound report from 10/19/2009 remarks normal placenta, no evidence of previa reported.  US performed 10/19/09 demonstrated normal fetal anatomy and EDD of 02/11/10.  28 week labs were unremarkable and she has passed her 3hr GTT.  Bedside US today shows transverse lie (head on R), good fetal movement.  To watch for repositioning; if not clearly vertex by 35weeks, to discuss external version with OBs at [facility], which must be done in L&D or operatory while monitored. The patient's blood pressure should continue to be monitored closely for any signs preeclampsia.  Orders: Medicaid OB visit - [facility] ([id number])  Problem # 2:  ELEVATED BP READING WITHOUT DX HYPERTENSION (ICD-796.2) BP not  elevated today, prior labs from last visit reviewed with patient.   Will continue to monitor closely.  Problem # 3:  UNSPECIFIED NYSTAGMUS (ICD-379.50)  Patient with nystagmus since childhood; also with reports of floaters that have worsened in the past three weeks.  For ophthalmology evaluation.  Attempt at undilated funduscopy today in the office was suboptimal.  Denies blurred vision or headache.   Orders: Ophthalmology Referral (Ophthalmology)  Problem # 4:  BACTERIAL VAGINITIS (ICD-616.10)  Pt found to have BV on wet-mount.  While it is not imperitive that this be treated, the patient was fairly insistant that she would like to be treated.  Will give a one week course of flagyl for this condition.  Her updated medication list for this problem includes:    Flagyl 500 Mg Tabs (Metronidazole) .Marland Kitchen... Take one twice per day for 7 days  Orders: Medicaid OB visit - [facility] ([id number])  Problem # 5:  TOBACCO USER (ICD-305.1) Counseled about tobacco cessation.   Complete Medication List: 1)  Th Prenatal Vitamins 28-0.8 Mg Tabs (Prenatal vit-fe fumarate-fa) 2)  Flagyl 500 Mg Tabs (Metronidazole) .... Take one twice per day for 7 days  Other Orders: GC/Chlamydia-[facility] ([id number]) [facility] [phone])  Patient Instructions: 1)  It was a pleasure to meet you today.  Your estimated gestational age is 30 weeks and 4 days, giving you a estimated delivery date of 02/11/2010. 2)  I am glad  you are interested in stopping smoking!  It is the best thing for you and your baby/family's health.  3)  If you have vaginal bleeding, suspect your water has broken (large gush of fluid or continuous slow trickle from vagina) or you have more than 5 contractions per hour for 2 hours straight, please go directly to the MAU. 4)  Every morning and every evening evaluate whether you think the baby is moving enough.  If not, then do kick counts as follows.  Sit in a quiet place and count each movement.  If you get to 5  baby movements in an hour you can stop.  If at 1 hour you have less than 5 movements, keep counting for another hour.  If you don't get 10 movements in these 2 hours, you should go to the MAU or call the clinic. 5)  If you have any further vaginal bleeding, please go to the [facility]U at [facility].  6)  Please come back to see Dr. Gwendolyn Grant in 1 week.  7)  OB Followup with Dr Gwendolyn Grant in 1 week.  8)  I have given you a prescription for Flagyl for your bacterial vaginosis.  As we discussed, if you have problems taking the medicine due to nausea/vomiting, I do not feel strongly that you need to force yourself to finish the course. Prescriptions: FLAGYL 500 MG TABS (METRONIDAZOLE) Take one twice per day for 7 days  #14 x 0   Entered and Authorized by:   Majel Homer MD   Signed by:   Majel Homer MD on 12/28/2009   Method used:   Electronically to        [facility] (retail)       [address]       [place], [place]  [address]       Ph: [phone]       Fax: [phone]   RxID:   [id number]    Orders Added: 1)  GC/Chlamydia-[facility] [[id number]] 2)  Mellody Drown Prep- [facility] [[id number]] 3)  Ophthalmology Referral [Ophthalmology] 4)  Medicaid OB visit - [facility] [[id number]]     Flowsheet View for Follow-up Visit    Estimated weeks of       gestation:     33 4/7    Weight:     238    Blood pressure:   123 / 80    Hx headache?     No    Nausea/vomiting?   No    Edema?     TrHnd    Bleeding?     yes    Leakage/discharge?   no    Fetal activity:       yes    Labor symptoms?   few ctx    Fundal height:      34.5    FHR:       130s    Fetal position:      transv    Smoking PPD:   1/4 ppd   Flowsheet View for Follow-up Visit    Estimated weeks of       gestation:     33 4/7    Weight:     238    Blood pressure:   123 / 80    Hx headache?     No    Nausea/vomiting?   No    Edema?     TrHnd    Bleeding?  yes    Leakage/discharge?   no    Fetal activity:       yes     Labor symptoms?   few ctx    Fundal height:      34.5    FHR:       130s    Fetal position:      transv    Smoking PPD:   1/4 ppd    Laboratory Results  Date/Time Received: December 28, 2009 10:59 AM  Date/Time Reported: December 28, 2009 11:08 AM   [employer] Source: vag WBC/hpf: >20 Bacteria/hpf: 3+  Cocci Clue cells/hpf: many Yeast/hpf: none Trichomonas/hpf: none Comments: ...............test performed by......Marland KitchenBonnie A. [place], MLS (ASCP)cm

## 2010-04-03 NOTE — Progress Notes (Signed)
  Phone Note Call from Patient   Caller: Patient Summary of Call: Back pain and slow vaginal bleeding at 24and [redacted] weeks gestation. Has been bleeding for 3 weeks and has been evaluated 1.5 weeks ago for bleeding in MAU and was given the "OK". However developed back pain in addition to the bleeding today. Pt wonders what to do. My concern is for labor.  Plan: Asked pt to go directly to MAU for further evaluation.  Initial call taken by: Clementeen Graham MD,  October 27, 2009 6:17 PM

## 2010-04-05 NOTE — Assessment & Plan Note (Signed)
Summary: set up for induction   Vital Signs:  Patient profile:   25 year old female Height:      68 inches Weight:      244 pounds Pulse rate:   97 / minute BP sitting:   131 / 84  (left arm) Cuff size:   large  Vitals Entered By: Tessie Fass CMA (February 15, 2010 2:13 PM) CC: OB Visit   Primary Care Provider:  Renold Don MD  CC:  OB Visit.  History of Present Illness: 25 y/o pt comes in for an OB visit. She is over due and wants to be set up for an induction.   Habits & Providers  Alcohol-Tobacco-Diet     Cigarette Packs/Day: 1/4 ppd  Current Medications (verified): 1)  None  Allergies (verified): 1)  ! Hydrocodone  Physical Exam  General:  Well-developed,well-nourished,in no acute distress; alert,appropriate and cooperative throughout examination Abdomen:  bedside  US done today to verify the head is vertex.  It is and the baby appears to be doing well   Impression & Recommendations:  Problem # 1:  PREGNANCY (ICD-V22.2) Assessment Unchanged Pt is having some hand swelling, her BP has been steadily creeping up from the 120's in Nov to the 130's in Dec. She is post dates and ready to be set up for induction. Her PCP is Dr. Gwendolyn Grant for this delivery. We will work on getting her set up for induction.   Orders: Medicaid OB visit - [facility] ([id number])  Patient Instructions: 1)  We are working on setting up an induction date for you.  2)  Congrats on your new sons arrival soon!    Orders Added: 1)  Medicaid OB visit - [facility] [[id number]]     OB Initial Intake Information    Positive HCG by: self    Race: White    Marital status: Separated    Occupation: unemployed  FOB Information    Husband/Father of baby: FOB adopted, unknown history   Menstrual History    LMP (date): 05/07/2009    LMP - Character: normal    Menarche: 10 years    Menses interval: irregular days    Menstrual flow 3 days    On BCP's at conception: no   Flowsheet View for Follow-up Visit  Estimated weeks of       gestation:     40 4/7    Weight:     244    Blood pressure:   131 / 84    Headache:     No    Nausea/vomiting:   No    Edema:     HANDS ARE SWOLLEN    Vaginal bleeding:   yes    Vaginal discharge:   d/c    FHR:       125    Fetal activity:     yes    Labor symptoms:   few ctx    Fetal position:     vertex    Taking prenatal vits?   N    Smoking:     1/4 ppd    Resident:     Clotilde Dieter    Comment:     schedule induction today    Flowsheet View for Follow-up Visit    Estimated weeks of       gestation:     40 4/7    Weight:     244    Blood pressure:   131 / 84    Hx headache?  No    Nausea/vomiting?   No    Edema?     HANDS ARE SWOLLEN    Bleeding?     yes    Leakage/discharge?   d/c    Fetal activity:       yes    Labor symptoms?   few ctx    FHR:       125    Fetal position:      vertex    Taking Vitamins?   N    Smoking PPD:   1/4 ppd    Comment:     schedule induction today    Resident:     Clotilde Dieter

## 2010-04-11 NOTE — Assessment & Plan Note (Signed)
Summary: 6 wk pp/eo   Vital Signs:  Patient profile:   25 year old female Height:      68 inches Weight:      226.2 pounds BMI:     34.52 Temp:     97.7 degrees F oral Pulse rate:   78 / minute BP sitting:   147 / 87  (left arm) Cuff size:   regular  Vitals Entered By: Jimmy Footman, CMA (April 02, 2010 8:49 AM) CC: 6wk pp check Is Patient Diabetic? No Pain Assessment Patient in pain? no        Primary Care Provider:  Renold Don MD  CC:  6wk pp check.  History of Present Illness: 6 week PP check:  Main concern for patient is that she is having very little help at home.  Living in home with three other adults, four other children in 3 bedroom house.  Her new baby is by far youngest child.  He is very colicky and this is causing much stress in household.  Chip Boer is actively looking for new housing.  Pain is nonexistent, lochia has stopped.  Not currently sexually active.    ROS:  no fevers, vaginal discharge, abdominal pain, edema, headaches, vision changes, N/V,  chest pain or shortness of breath.  Does complain of constipation since before delivery and now after as well.  Only 3-4 BM's per week.    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 Take 1 Tab By Mouth Two Times A Day For Constipation 2)  Miralax  Powd (Polyethylene Glycol 3350) .... Add 1 Capful To Drink of Choice in Am and Pm - Hold For Loose Stools  Allergies (verified): 1)  ! Hydrocodone  Past History:  Past medical, surgical, family and social histories (including risk factors) reviewed, and no  changes noted (except as noted below).  Past Medical History: Reviewed history from 10/19/2009 and no changes required. G1P1, daughter delivered 29' 3oz vaginally at [facility], 2006. PCOS diagnosis by Penne Lash 2006 - Endrocrinologist - [facility]) - due to see 10/08 Panic disorder Trichomonas - 2002 Hx of monoarticular knee swelling - right knee 5/10 that resolved Bipolar Disorder  Past Surgical History: Reviewed history from 10/20/2006 and no changes required. Cholecystectomy May '08, 2/2 obstructive cholecystitis. Eye surgery for "nystagmus" @ 6mos.  "tightened muscle"  Family History: Reviewed history from 11/24/2008 and no changes required. Family History Diabetes 1st degree relative Family History Kidney disease Family History Other cancer - leukemia Family History Thyroid disease Family History Psychiatric care--aunt comitted suicide  Social History: Reviewed history from 10/19/2009 and no changes required. Lives in [place].  Moved to [place] end of 2010 to be with fiancee, became pregnant.  Different father from previous child.  [name] went to jail and she moved back to [place] to be with family October 01, 2009.  Occupation: None, looking for work Married Current smoker 1/2 pack per day x 9 months.  Alcohol use-no Drug use-no Regular exercise-yes-exercise DVD  Physical Exam  General:  Vital signs reviewed. Well-developed, well-nourished patient in NAD.  Awake and cooperative  Eyes:  no  conjunctivitis or drainage.  Horizontal nystagmus noted, unchanged from prior exam.   Mouth:  Oral mucosa pink and moist Dentition:  fair Neck:  no goiter Lungs:  clear to auscultation bilaterally without wheezing, rales, or rhonchi.  Normal work of breathing  Heart:  Regular rate and rhythm without murmur, rub, or gallop.  Normal S1/S2  Abdomen:  soft/nondistended/nontender.  Bowel sounds present and normoactive.  No organomegaly noted.  Uterine fundus not palpable.     Genitalia:  Deferred due to patient request - no vaginal trauma during birth   Extremities:  No clubbing, cyanosis, edema, or deformity noted with normal full range of motion of all joints.  No edema noted Neurologic:  no focal deficits.     Impression & Recommendations:  Problem # 1:  POSTPARTUM EXAMINATION (ICD-V24.2) Assessment Comment Only Concern is for patient feeling overwhelmed at home.  She did pass PHQ-2, however.   Father of baby no longer involved in care.  Does have some relief in baby care from friends in area, but not often.   Discussed contraception, patient would like Mirena.  Due to time constraints will not start that today, but she is to fu in 2 weeks.  States she will not have sex in next 2 weeks, but will use condoms if so.   States that [facility] checked Hgb and told her it was high 17, 18 on two checks.  Will check today.  No red flags on exam or by history.  See below.     Orders: Postpartum visit- [facility] (59430) Hemoglobin-[facility] (95621)  Problem # 2:  CONSTIPATION (ICD-564.00) Assessment: Deteriorated Better during pregnancy but now worse.  Not taking any thing for this.  See instructions.   Her updated medication list for this problem includes:    Colace 100 Mg Caps (Docusate sodium) .Marland Kitchen... Take 1 tab by mouth two times a day for constipation    Miralax Powd (Polyethylene glycol 3350) .Marland Kitchen... Add 1 capful to drink of choice in am and pm - hold for loose stools  Complete Medication List: 1)  Colace 100 Mg Caps (Docusate sodium) .... Take 1 tab by mouth two times a day for constipation 2)  Miralax Powd (Polyethylene glycol 3350) .... Add 1 capful to drink of choice in am and pm - hold for loose stools  Patient Instructions: 1)  The goal with constipation is to have one soft bowel movement a day. 2)  Drink plenty of water, increase your fiber intake (grains, fruits, vegetables). 3)  Do not delay bowel movements. 4)  Prune juice daily may be helpful. 5)  Exercise  regularly, like going for walks daily. 6)  Take the Colace 1 pill in AM and PM.   7)  Take the Miralax 1 capful in AM and PM.  You can increase to three times a day if this is not working.  Stop taking if you start having diarrhea.   Prescriptions: MIRALAX  POWD (POLYETHYLENE GLYCOL 3350) Add 1 capful to drink of choice in AM and PM - hold for loose stools  #1 x 2   Entered and Authorized by:   Renold Don MD   Signed by:   Renold Don MD on 04/02/2010   Method used:   Electronically to        [facility] [phone]* (retail)       [address]       [place], [place]  [address]       Ph: [phone]  Fax: [phone]   RxID:   [id number] COLACE 100 MG CAPS (DOCUSATE SODIUM) Take 1 tab by mouth two times a day for constipation  #60 x 1   Entered and Authorized by:   Renold Don MD   Signed by:   Renold Don MD on 04/02/2010   Method used:   Electronically to        [facility] [phone]* (retail)       [address]       [place], [place]  [address]       Ph: [phone]       Fax: [phone]   RxID:   [id number]    Orders Added: 1)  Postpartum visit- [facility] [[id number]] 2)  Hemoglobin-[facility] [[id number]]    Laboratory Results   Blood Tests   Date/Time Received: April 02, 2010 9:32 AM  Date/Time Reported: April 02, 2010 10:05 AM     CBC   HGB:  14.8 g/dL   (Normal Range: 18.8-41.6 in Males, 12.0-15.0 in Females) Comments: capillary sample ...............test performed by......Marland KitchenBonnie A. [place], MLS (ASCP)cm

## 2010-04-19 ENCOUNTER — Ambulatory Visit (INDEPENDENT_AMBULATORY_CARE_PROVIDER_SITE_OTHER): Payer: Medicaid Other | Admitting: Family Medicine

## 2010-04-19 ENCOUNTER — Encounter: Payer: Self-pay | Admitting: Family Medicine

## 2010-04-19 DIAGNOSIS — Z309 Encounter for contraceptive management, unspecified: Secondary | ICD-10-CM

## 2010-04-19 DIAGNOSIS — R300 Dysuria: Secondary | ICD-10-CM

## 2010-04-19 DIAGNOSIS — E282 Polycystic ovarian syndrome: Secondary | ICD-10-CM

## 2010-04-19 LAB — POCT URINALYSIS DIPSTICK
Blood, UA: NEGATIVE
Glucose, UA: NEGATIVE
Spec Grav, UA: >=1.03
pH, UA: 5.5

## 2010-04-19 LAB — POCT UA - MICROSCOPIC ONLY

## 2010-04-19 MED ORDER — MEDROXYPROGESTERONE ACETATE 10 MG PO TABS: 10.0000 mg | ORAL_TABLET | Freq: Every day | ORAL | Status: DC

## 2010-04-19 MED ORDER — NORGESTIMATE-ETH ESTRADIOL 0.25-35 MG-MCG PO TABS: 1.0000 | ORAL_TABLET | Freq: Every day | ORAL | Status: DC

## 2010-04-19 NOTE — Patient Instructions (Signed)
Your urine showed that you were pretty dehydrated, but no UTI. Make sure you drink plenty of water or gatorade to help with this.  8 cups a day of water is best. Take the Progestin 10 days, then start the birth control (Sprintec).   Think about other forms of birth control if you want.  It was good to see you. We will start looking at your blood sugars next visit in 1 month.

## 2010-04-19 NOTE — Assessment & Plan Note (Signed)
Plan to address with patient at next visit.   She was on Metformin 1000 mg po bid prior to pregnancy but not currently taking anything due to pregnancy.   Will check A1C and Fasting glucose next visit.  Did not do today b/c patient with 70 month old son who was getting vaccines.

## 2010-04-19 NOTE — Assessment & Plan Note (Signed)
Decided against Mirena today.   Will provide 5 day course of Progestin to have period, then start Sprintec. Patient interested in having periods monthly to ensure not pregnant.   Would like to consider other contraception options, discussed these with her, provided handout. >35 minutes face-time providing contraception management to patient.

## 2010-04-19 NOTE — Progress Notes (Signed)
Addended by: [place], Emmajean Ratledge on: 04/19/2010 07:11 PM   Modules accepted: Orders

## 2010-04-19 NOTE — Assessment & Plan Note (Signed)
Urine with many epi's, mucus. Some calcium oxalate crystal, spec grav > 1.030, so patient likely dehydrated.  Recommended to hydrate, no UTI treatment at this time.

## 2010-04-19 NOTE — Progress Notes (Signed)
  Subjective:    Patient ID: [name], female    DOB: 12-Aug-1985, 25 y.o.   MRN: [id number]  HPI 1.  Contraception:  25 yo G2P2002 2 months postpartum here to discuss contraception.  Initially believed she wanted Mirena insertion, but not sure currently due to pain.  Would like to discuss other contraception options.  States she still has not had a period since giving birth, does complain of menstrual cramping for past few days.  Cramping mostly Right lower quadrant and peri-umbilical, present for past month or so.  Also with dysuria, see below.  No nausea, vomiting, chest pain, shortness of breath, lightheadedness, palpitations.    2. Dysuria:  Has been complaining of this for past week.  Also complaining of increased urgency, no hesitancy.  No blood in urine.  Described as burning with urination.    3.  PCOS:  Patient on Metformin in past.  Not taking due to pregnancy currently.  Has not had A1C.  Would like to know what she wants to do regarding future management.  See above for abdominal pain, under contraception.  Told by friend that she should not have Mirena b/c it can worsen cysts.    Review of Systems See HPI above for review of systems.       Objective:   Physical Exam Gen:  Alert, cooperative patient who appears stated age in no acute distress.  Vital signs reviewed. HEENT:  PERRL, EOMI, Dry mucus membranes.   Cardiac:  Regular rate and rhythm without murmur auscultated.  Good S1/S2. Pulm:  Clear to auscultation bilaterally with good air movement.  No wheezes or rales noted.   Abd:  Soft/obese.  Some tenderness at RLQ and umbilicus, mild in nature.  No guarding or rebound.   Good bowel sounds throughout all four quadrants.  No masses noted.  Gyn:  External genitalia within normal limits.  Some condyloma noted labia majora, mostly posterior to vaginal fourchette.  Vaginal mucosa pink, moist, normal rugae.  Nonfriable cervix without lesions, no discharge noted.  No blood or fluid  in vaginal vault.  Bimanual exam revealed normal, nongravid uterus.  No cervical motion tenderness. No adnexal masses bilaterally.  Some mild tenderness described as "crampy" on palpation of Right adnexa. Neuro:  No focal deficits.               Assessment & Plan:

## 2010-04-19 NOTE — Assessment & Plan Note (Signed)
>>  ASSESSMENT AND PLAN FOR POLYCYSTIC OVARIAN DISEASE WRITTEN ON 04/19/2010  4:53 PM BY Gwendolyn Grant, JEFFREY H, MD  Plan to address with patient at next visit.   She was on Metformin 1000 mg po bid prior to pregnancy but not currently taking anything due to pregnancy.   Will check A1C and Fasting glucose next visit.  Did not do today b/c patient with 50 month old son who was getting vaccines.

## 2010-04-24 ENCOUNTER — Telehealth: Payer: Self-pay | Admitting: Family Medicine

## 2010-04-24 NOTE — Telephone Encounter (Signed)
Had baby in December.  Did not have any period in January.  No having heavy bleeding. Uses a pad every 2 hours. Now will have occasional chest ache in left chest off and on. No palpitations or trouble breathing.  Advised to be seen in clinic tomorrow. Reviewed red flags. Pt voices understanding.

## 2010-05-02 ENCOUNTER — Inpatient Hospital Stay (INDEPENDENT_AMBULATORY_CARE_PROVIDER_SITE_OTHER)
Admission: RE | Admit: 2010-05-02 | Discharge: 2010-05-02 | Disposition: A | Discharge status: Home / Self Care | Payer: Medicaid Other | Source: Ambulatory Visit | Attending: Emergency Medicine | Admitting: Emergency Medicine

## 2010-05-02 DIAGNOSIS — L509 Urticaria, unspecified: Secondary | ICD-10-CM

## 2010-05-02 DIAGNOSIS — J40 Bronchitis, not specified as acute or chronic: Secondary | ICD-10-CM

## 2010-05-14 LAB — CBC
Hemoglobin: 14.4 g/dL (ref 12.0–15.0)
MCV: 86.1 fL (ref 78.0–100.0)
Platelets: 150 10*3/uL (ref 150–400)
RBC: 4.67 MIL/uL (ref 3.87–5.11)
WBC: 10 10*3/uL (ref 4.0–10.5)

## 2010-05-14 LAB — RPR: RPR Ser Ql: NONREACTIVE

## 2010-05-15 LAB — CBC
MCHC: 34.8 g/dL (ref 30.0–36.0)
Platelets: 147 10*3/uL — ABNORMAL LOW (ref 150–400)
RDW: 13.2 % (ref 11.5–15.5)

## 2010-05-15 LAB — COMPREHENSIVE METABOLIC PANEL
ALT: 13 U/L (ref 0–35)
AST: 16 U/L (ref 0–37)
Albumin: 2.5 g/dL — ABNORMAL LOW (ref 3.5–5.2)
Calcium: 8.7 mg/dL (ref 8.4–10.5)
GFR calc Af Amer: >60 mL/min (ref >60)
Glucose, Bld: 92 mg/dL (ref 70–99)
Sodium: 135 mEq/L (ref 135–145)
Total Protein: 5.8 g/dL — ABNORMAL LOW (ref 6.0–8.3)

## 2010-05-15 LAB — URINALYSIS, ROUTINE W REFLEX MICROSCOPIC
Glucose, UA: NEGATIVE mg/dL
Ketones, ur: NEGATIVE mg/dL
pH: 6 (ref 5.0–8.0)

## 2010-05-15 LAB — PROTEIN / CREATININE RATIO, URINE
Creatinine, Urine: 113 mg/dL
Protein Creatinine Ratio: 0.11 (ref 0.00–0.15)
Total Protein, Urine: 12 mg/dL

## 2010-05-15 LAB — URINE MICROSCOPIC-ADD ON

## 2010-05-17 LAB — URINALYSIS, ROUTINE W REFLEX MICROSCOPIC
Bilirubin Urine: NEGATIVE
Glucose, UA: NEGATIVE mg/dL
Glucose, UA: NEGATIVE mg/dL
Ketones, ur: NEGATIVE mg/dL
Ketones, ur: NEGATIVE mg/dL
Protein, ur: NEGATIVE mg/dL
pH: 6.5 (ref 5.0–8.0)

## 2010-05-17 LAB — URINE CULTURE: Culture  Setup Time: 201108190211

## 2010-05-17 LAB — URINE MICROSCOPIC-ADD ON

## 2010-06-07 LAB — URINE MICROSCOPIC-ADD ON

## 2010-06-07 LAB — URINALYSIS, ROUTINE W REFLEX MICROSCOPIC
Ketones, ur: NEGATIVE mg/dL
Nitrite: NEGATIVE
Specific Gravity, Urine: 1.024 (ref 1.005–1.030)
Urobilinogen, UA: 0.2 mg/dL (ref 0.0–1.0)
pH: 6 (ref 5.0–8.0)

## 2010-06-08 LAB — URINALYSIS, ROUTINE W REFLEX MICROSCOPIC
Bilirubin Urine: NEGATIVE
Glucose, UA: NEGATIVE mg/dL
Hgb urine dipstick: NEGATIVE
Ketones, ur: NEGATIVE mg/dL
Protein, ur: NEGATIVE mg/dL
Urobilinogen, UA: 0.2 mg/dL (ref 0.0–1.0)

## 2010-06-08 LAB — URINE CULTURE

## 2010-06-08 LAB — DIFFERENTIAL
Basophils Relative: 0 % (ref 0–1)
Eosinophils Absolute: 0.1 10*3/uL (ref 0.0–0.7)
Eosinophils Relative: 1 % (ref 0–5)
Monocytes Absolute: 0.6 10*3/uL (ref 0.1–1.0)
Monocytes Relative: 6 % (ref 3–12)
Neutrophils Relative %: 74 % (ref 43–77)

## 2010-06-08 LAB — CBC
HCT: 39.7 % (ref 36.0–46.0)
MCHC: 34.1 g/dL (ref 30.0–36.0)
MCV: 81.8 fL (ref 78.0–100.0)
RBC: 4.86 MIL/uL (ref 3.87–5.11)

## 2010-06-08 LAB — PREGNANCY, URINE: Preg Test, Ur: NEGATIVE

## 2010-06-08 LAB — BASIC METABOLIC PANEL
CO2: 26 mEq/L (ref 19–32)
Chloride: 108 mEq/L (ref 96–112)
Creatinine, Ser: 0.72 mg/dL (ref 0.4–1.2)
GFR calc Af Amer: >60 mL/min (ref >60)
Potassium: 3.7 mEq/L (ref 3.5–5.1)

## 2010-06-12 LAB — URINALYSIS, ROUTINE W REFLEX MICROSCOPIC
Nitrite: NEGATIVE
Protein, ur: NEGATIVE mg/dL
Specific Gravity, Urine: 1.026 (ref 1.005–1.030)
Urobilinogen, UA: 0.2 mg/dL (ref 0.0–1.0)

## 2010-06-12 LAB — POCT PREGNANCY, URINE: Preg Test, Ur: NEGATIVE

## 2010-06-13 LAB — BASIC METABOLIC PANEL
CO2: 27 mEq/L (ref 19–32)
Calcium: 8.9 mg/dL (ref 8.4–10.5)
Creatinine, Ser: 0.71 mg/dL (ref 0.4–1.2)
GFR calc Af Amer: >60 mL/min (ref >60)
GFR calc non Af Amer: >60 mL/min (ref >60)
Glucose, Bld: 86 mg/dL (ref 70–99)

## 2010-06-13 LAB — URINE MICROSCOPIC-ADD ON

## 2010-06-13 LAB — DIFFERENTIAL
Basophils Absolute: 0.1 10*3/uL (ref 0.0–0.1)
Basophils Relative: 1 % (ref 0–1)
Neutro Abs: 7 10*3/uL (ref 1.7–7.7)
Neutrophils Relative %: 69 % (ref 43–77)

## 2010-06-13 LAB — POCT PREGNANCY, URINE: Preg Test, Ur: NEGATIVE

## 2010-06-13 LAB — CBC
MCHC: 34.7 g/dL (ref 30.0–36.0)
RDW: 13.6 % (ref 11.5–15.5)

## 2010-06-13 LAB — URINALYSIS, ROUTINE W REFLEX MICROSCOPIC
Bilirubin Urine: NEGATIVE
Ketones, ur: 15 mg/dL — AB
Nitrite: NEGATIVE
Specific Gravity, Urine: 1.028 (ref 1.005–1.030)
Urobilinogen, UA: 0.2 mg/dL (ref 0.0–1.0)
pH: 6 (ref 5.0–8.0)

## 2010-06-14 LAB — DIFFERENTIAL
Basophils Absolute: 0.1 10*3/uL (ref 0.0–0.1)
Basophils Relative: 1 % (ref 0–1)
Eosinophils Absolute: 0.3 10*3/uL (ref 0.0–0.7)
Eosinophils Absolute: 0.2 10*3/uL (ref 0.0–0.7)
Eosinophils Relative: 2 % (ref 0–5)
Lymphs Abs: 2.8 10*3/uL (ref 0.7–4.0)
Monocytes Absolute: 0.6 10*3/uL (ref 0.1–1.0)
Monocytes Relative: 4 % (ref 3–12)
Neutrophils Relative %: 70 % (ref 43–77)

## 2010-06-14 LAB — PROTIME-INR: Prothrombin Time: 13.3 seconds (ref 11.6–15.2)

## 2010-06-14 LAB — COMPREHENSIVE METABOLIC PANEL
ALT: 17 U/L (ref 0–35)
ALT: 18 U/L (ref 0–35)
AST: 17 U/L (ref 0–37)
Alkaline Phosphatase: 63 U/L (ref 39–117)
CO2: 26 mEq/L (ref 19–32)
CO2: 29 mEq/L (ref 19–32)
Chloride: 105 mEq/L (ref 96–112)
Chloride: 107 mEq/L (ref 96–112)
GFR calc Af Amer: >60 mL/min (ref >60)
GFR calc non Af Amer: >60 mL/min (ref >60)
GFR calc non Af Amer: >60 mL/min (ref >60)
Glucose, Bld: 99 mg/dL (ref 70–99)
Potassium: 4 mEq/L (ref 3.5–5.1)
Sodium: 136 mEq/L (ref 135–145)
Sodium: 140 mEq/L (ref 135–145)
Total Bilirubin: 0.6 mg/dL (ref 0.3–1.2)
Total Protein: 7.2 g/dL (ref 6.0–8.3)

## 2010-06-14 LAB — PREGNANCY, URINE
Preg Test, Ur: NEGATIVE
Preg Test, Ur: NEGATIVE

## 2010-06-14 LAB — URINALYSIS, ROUTINE W REFLEX MICROSCOPIC
Glucose, UA: NEGATIVE mg/dL
Hgb urine dipstick: NEGATIVE
Nitrite: NEGATIVE
Protein, ur: NEGATIVE mg/dL
Specific Gravity, Urine: 1.023 (ref 1.005–1.030)
Specific Gravity, Urine: 1.028 (ref 1.005–1.030)
pH: 7 (ref 5.0–8.0)

## 2010-06-14 LAB — CBC
Hemoglobin: 13.8 g/dL (ref 12.0–15.0)
RBC: 4.94 MIL/uL (ref 3.87–5.11)
RBC: 4.81 MIL/uL (ref 3.87–5.11)
WBC: 11.6 10*3/uL — ABNORMAL HIGH (ref 4.0–10.5)
WBC: 11.4 10*3/uL — ABNORMAL HIGH (ref 4.0–10.5)

## 2010-06-14 LAB — LIPASE, BLOOD: Lipase: 28 U/L (ref 11–59)

## 2010-06-18 LAB — POCT I-STAT, CHEM 8
BUN: 16 mg/dL (ref 6–23)
Calcium, Ion: 1.08 mmol/L — ABNORMAL LOW (ref 1.12–1.32)
Glucose, Bld: 143 mg/dL — ABNORMAL HIGH (ref 70–99)
TCO2: 25 mmol/L (ref 0–100)

## 2010-07-17 NOTE — Discharge Summary (Signed)
NAMECARO, BRUNDIDGE             ACCOUNT NO.:  [id number]   MEDICAL RECORD NO.:  [id number]          PATIENT TYPE:  INP   LOCATION:  5734                         FACILITY:  [facility]   PHYSICIAN:  Leonie Man, M.D.   DATE OF BIRTH:  1985/11/16   DATE OF ADMISSION:  07/04/2006  DATE OF DISCHARGE:  07/08/2006                               DISCHARGE SUMMARY   CHIEF COMPLAINT/REASON FOR ADMISSION:  Mackenzie Gibson a 25 year old female  patient with 24-hours' worth of right upper quadrant abdominal pain.  She presented to the ER with an elevated white count of 12,000.  Ultrasound of the abdomen revealed numerous gallstones and slightly  thickened gallbladder wall with a sonographic Murphy sign.  She had mild  transaminitis, but pancreatic enzymes were normal.  Surgery was called  in to admit the patient. On exam, her abdomen is obese, bowel sounds  were present, mildly tender to palpation in the right upper quadrant  with voluntary guarding without palpable mass.  No sign of hernia was  noted. She had a elevated white count.  SGOT was 460.  SGPT was 455,  total bilirubin 1.2 and lipase 29.  The patient was admitted by Dr.  Gerrit Friends with a diagnosis of acute cholecystitis with cholelithiasis, rule  out choledocholithiasis as well as obesity and insulin resistance,  possible diabetes.   HOSPITAL COURSE:  The patient was admitted where she started on IV  antibiotics and IV fluids.  Repeat labs were checked with plans to  possibly proceed with surgical intervention within the next 24 hours.  On first hospital day, her white count had decreased to 7400, hemoglobin  12.9 after hydration, total bilirubin was down to 0.9, ALP down to 112,  AST down to 376 and ALT down to 481 and she was otherwise deemed  appropriate for cholecystectomy.  Risks and benefits were discussed with  the patient per Dr. Lurene Shadow and the patient was subsequently taken to the  OR on hospital day #1 by Dr. Lindie Spruce where she  underwent laparoscopic  cholecystectomy with IOC.  The IOC was consistent with a possible common  bile duct obstruction so gastroenterology was consulted.  Dr. Evette Cristal  evaluated the patient and opted at this point to follow the patient  clinically and repeat LFTs and his LFTs trended down, the patient may  not he would need to undergo ERCP.  On postop day one the patient was  doing well.  Her LFTs were decreasing, she was hungry.  Dr. Evette Cristal at  this point did not see need for performing an ERCP and continued to  monitor the patient. On postop day two, she was passing flatus,  tolerating this diet and was not having abdominal pain in her incisions  were normal in appearance.  She had slight increase in AST and ALT as  well as total bilirubin postoperatively, so she was kept in the hospital  an additional day and GI was asked to come back and reevaluate for  possibility of ERCP. Because of the elevation in LFTs it was opted that  the patient would need to undergo ERCP just to ensure  that there was no  ductal obstruction.  The patient did explain to GI services that in the  past, when she was on metformin for her sugar, that it did raise her  transaminases.  On postop day two, the patient did undergo an ERCP with  Dr. Evette Cristal.  She underwent sphincterotomy, but no stones recovered, the  patient was otherwise stable. By postop day three, her total bilirubin  was down to 1.1, ALT down to 259, AST down to 98.  She was otherwise  deemed appropriate for discharge home.   DISCHARGE DIAGNOSES:  1. Acute cholecystitis, status post laparoscopic cholecystectomy.  2. Transaminitis with an obstructive pattern, status post ERCP without      any identifiable stones or strictures.  3. History of polycystic ovarian syndrome.  4. Hyperglycemia   DISCHARGE MEDICATIONS:  1. The patient will resume the same medications she was taking prior      to admission including metformin 1000 mg twice daily.  Follow up       the family doctor to check the liver test in one week before      restarting this medication, i.e. this medicine has been placed on      hold until she sees her family doctor.  2. Over-the-counter Tylenol or and/or Motrin for pain.  Do not take      any Tylenol if she were taking other products with Tylenol such as      Vicodin.  3. His over-the-counter Colace twice daily to prevent constipation      while taking any pain medications. 4.  Over-the-counter MiraLax as      needed for severe constipation.  4. Ultram 50 mg every six hours as needed for pain.   DIET:  __________ modified.   ACTIVITY:  Increase activity slowly.  May walk up steps, may shower.  No  lifting greater than 15 pounds for one week.   WOUND CARE:  Allow Steri-Strips to fall off.   FOLLOW-UP APPOINTMENTS:  She needs to call Dr. Dixon Boos office to be seen  in two weeks.  She needs to call Dr. Luan Moore office with the  gastroenterology service if you have any other problems related to your  stomach.      Allison L. Rennis Harding, N.P.      Leonie Man, M.D.  Electronically Signed    ALE/MEDQ  D:  09/25/2006  T:  09/26/2006  Job:  [id number]   cc:   Cherylynn Ridges, M.D.  Graylin Shiver, M.D.

## 2010-07-20 NOTE — Consult Note (Signed)
Mackenzie Gibson, STOLZE NO.:  [id number]   MEDICAL RECORD NO.:  [id number]          PATIENT TYPE:  INP   LOCATION:  5734                         FACILITY:  [facility]   PHYSICIAN:  Graylin Shiver, M.D.   DATE OF BIRTH:  March 19, 1985   DATE OF CONSULTATION:  07/05/2006  DATE OF DISCHARGE:                                 CONSULTATION   REASON FOR CONSULTATION:  The patient is a 25 year old female who  underwent laparoscopic cholecystectomy today secondary to  cholelithiasis, cholecystitis.  The patient had intraoperative  cholangiogram which did not show a common bile duct stone or dilation of  the biliary tree from my viewing this but also did not show passage of  contrast into the duodenum.  Dr. Lindie Spruce consulted due to possible common  bile duct obstruction.   PAST HISTORY:   ALLERGIES:  VICODIN.   MEDICATIONS:  Metformin.   MEDICAL PROBLEMS:  1. Polycystic ovary.  2. Obesity.  3. History of insulin resistance.   SOCIAL HISTORY:  Does not smoke or drink alcohol.  She is married, has  one child.   REVIEW OF SYSTEMS:  No complaints of chest pain, shortness of breath,  cough or sputum production.   PHYSICAL EXAMINATION:  GENERAL:  She is in no distress, nonicteric.  HEART:  Regular rhythm.  LUNGS:  Clear.  ABDOMEN:  A little post-op tenderness.   Her labs were reviewed.  Her liver enzymes, when she came into the  hospital, were normal with the exception of just a very mild elevation  of AST and ALT.  The following day, they elevated but again the  elevation was only of the AST and ALT.  The bilirubin and alkaline  phosphatase remained in the normal range.   IMPRESSION:  1. Status post laparoscopic cholecystectomy secondary to cholecystitis      with cholelithiasis.  2. Intraoperative cholangiogram showing no flow of contrast into the      duodenum.   I do not see a definite stone.  I am not sure if this finding represents  a stone blocking the ampule or  edema and spasm of the ampulla.  Of note,  is that the liver enzyme elevation is from the AST and ALT.  The  bilirubin and alkaline phosphatase are not elevated as one would expect  if this were an obstructive pattern.  This looks more like a  hepatocellular pattern.  The patient is obese.  She has insulin  resistance.  I wonder if she might have underlying fatty liver.   PLAN:  I will follow the patient clinically and repeat her liver enzymes  tomorrow and decide on ERCP based on the trend of the LFTs.  Given the  potential risks of ERCP, which include bleeding, infection, perforation,  and pancreatitis, and the pattern of her liver enzymes, I would  recommend clinically following this and watching the trend before  deciding on ERCP.  I have explained this to the patient and she  understands.           ______________________________  Graylin Shiver, M.D.  SFG/MEDQ  D:  07/05/2006  T:  07/05/2006  Job:  [id number]   cc:   Lindie Spruce, Dr.

## 2010-07-20 NOTE — Op Note (Signed)
NAMEANTONIETA, Mackenzie Gibson             ACCOUNT NO.:  [id number]   MEDICAL RECORD NO.:  [id number]          PATIENT TYPE:  INP   LOCATION:  5734                         FACILITY:  [facility]   PHYSICIAN:  Graylin Shiver, M.D.   DATE OF BIRTH:  [birth year]   DATE OF PROCEDURE:  07/07/2006  DATE OF DISCHARGE:                               OPERATIVE REPORT   OPERATION/PROCEDURE:  Endoscopic retrograde cholangiogram with  sphincterotomy.   INDICATIONS:  The patient is a 25 year old female, status post  laparoscopic cholecystectomy a few days ago for cholecystitis with  cholelithiasis.  Her intraoperative cholangiogram showed a suspected  blockage at the distal bile duct with no contrast going into the  duodenum.  The following day the patient's liver enzymes, which were  elevated, did improve and it was felt that the blockage was probably due  to sphincter of Oddi spasm or possibly a small stone having passed.  The  following day, today the liver enzymes bumped up again and because of  this it was felt that a small stone or obstruction was likely and ERCP  is being done.   Informed consent was obtained after explanation of the risks of  bleeding, infection, perforation and pancreatitis.   PREMEDICATION:  Fentanyl 125 mcg IV, Versed 12.5 mg IV, Benadryl 25 mg  IV.   PROCEDURE:  With the patient lying on her abdomen on the fluoroscopy  table, the Pentax duodenoscope was inserted into the oropharynx and  passed into the esophagus.  It was advanced down the esophagus,  then  into the stomach and into the duodenum.  No specific lesions were seen  in the stomach or duodenum.  The papilla of Vater was located.  There  was a tiny peripapillary diverticulum.  The papilla was oriented and  selectively cannulated with a guidewire and papillotome.  Contrast was  injected into the common duct.  The duct was not dilated.  There did not  appear to be any specific filling defects that I could see on the  fluoroscopy images.  However, because of the abnormal intraoperative  cholangiogram and bump elevation of the LFTs today, I went ahead and  performed a sphincterotomy.  After proper localization of the  sphincterotome, a nice sphincterotomy was achieved.  No bleeding  occurred.  The sphincterotome was then removed leaving the guidewire in  place.  A balloon was then advanced over the guidewire and advanced up  into the proximal bile duct.  It was inflated up to 9 mm and swept  through the duct.  Contrast was injected.  No stones were seen or gravel  seen coming out.  The 9 mm of balloon past easily out the sphincterotomy  site without obstruction.  The occlusion cholangiogram looked normal.  She tolerated the procedure well without complications.   IMPRESSION:  Status post ERCP sphincterotomy.  No obvious stones seen.  If there are small tiny pieces of gravel present in the biliary tree,  they should pass spontaneously through the sphincterotomy site.           ______________________________  Graylin Shiver, M.D.  SFG/MEDQ  D:  07/07/2006  T:  07/08/2006  Job:  [id number]

## 2010-07-20 NOTE — H&P (Signed)
Mackenzie Gibson, Mackenzie Gibson             ACCOUNT NO.:  [id number]   MEDICAL RECORD NO.:  [id number]          PATIENT TYPE:  INP   LOCATION:  5734                         FACILITY:  [facility]   PHYSICIAN:  Velora Heckler, MD      DATE OF BIRTH:  June 11, 1985   DATE OF ADMISSION:  07/04/2006  DATE OF DISCHARGE:                              HISTORY & PHYSICAL   CHIEF COMPLAINT:  Abdominal pain, nausea.   HISTORY OF PRESENT ILLNESS:  The patient is a 25 year old white female  from [place], [place] who presents to the Emergency Department  with a 24-hour history of right upper quadrant abdominal pain and  nausea.  The patient has had intermittent episodes of right upper  quadrant abdominal pain for 1 year.  Pain came on May1.  It has  persisted for greater than 24 hours.  In the Emergency Department, she  was noted to have an elevated white count of 12,000.  Ultrasound showed  numerous gallstones and a slightly thick-walled gallbladder with a  sonographic Murphy's sign.  Liver function tests showed elevated  transaminases.  Pancreatic enzyme was normal.  General Surgery is now  called to evaluate and manage.   PAST MEDICAL HISTORY:  1. A history of childbirth.  2. A history of polycystic ovary.  3. A history of insulin resistance.   MEDICATIONS:  Metformin.   ALLERGIES:  VICODIN CAUSING SWELLING.   SOCIAL HISTORY:  The patient is married.  She has one child.  She does  not smoke.  She does not drink alcohol.   FAMILY HISTORY:  Noncontributory although multiple distant family  members with cholecystectomy.   A 15-system review otherwise negative.   EXAM:  GENERAL:  A 26 year old moderately obese white female, anxious,  on a stretcher in the hallway at [facility] Emergency Department.  VITAL SIGNS:  Temperature 98.0.  Pulse 97.  Respirations 18.  Blood  pressure 135/88.  HEENT:  HEENT shows her to be normocephalic, atraumatic.  Sclerae clear.  Conjunctiva clear.  Pupils equal and  reactive.  Dentition fair.  Mucous  membranes moist.  Voice normal.  NECK:  Supple, nontender, without mass or lymphadenopathy.  There is no  tenderness.  LUNGS:  Clear to auscultation bilaterally without rales, rhonchi, or  wheeze.  CARDIAC:  Exam shows regular rate and rhythm without  significant murmur.  Peripheral pulses are full.  ABDOMEN:  Obese.  There are bowel sounds present.  There is mild  tenderness to palpation and percussion particularly in the right upper  quadrant.  There is voluntary guarding.  There is no palpable mass.  There is no sign of hernia.  EXTREMITIES:  Nontender without edema.  NEUROLOGIC:  Neurologically, the patient is alert and oriented without  focal deficit.   LABORATORY STUDIES:  White count 12.6, hemoglobin 14.2, platelet count  268,000.  Electrolytes are normal.  Glucose normal at 91, SGOT 460, SGPT  455, total bilirubin 1.2, alkaline phosphatase 106, lipase 29.   RADIOGRAPHIC STUDIES:  Ultrasound of the abdomen demonstrates multiple  gallstones.  There is mild wall thickening of the gallbladder with a  sonographic Murphy's sign present.   IMPRESSION:  1. Acute cholecystitis.  2. Cholelithiasis.  3. Rule out choledocholithiasis.  4. Insulin resistance on oral hypoglycemic.  5. Obesity.   PLAN:  The patient will be admitted on the general surgical service.  She will be empirically started on intravenous antibiotics.  Repeat  laboratory studies will be performed in 6-12 hours.  If there is  continued elevation of liver function tests, she may require  gastroenterology consultation for ERCP and sphincterotomy.  If her liver  function tests are improving, she may be able to proceed with  laparoscopic cholecystectomy and intraoperative cholangiography during  this hospitalization.      Velora Heckler, MD  Electronically Signed     TMG/MEDQ  D:  07/05/2006  T:  07/05/2006  Job:  [id number]

## 2010-07-20 NOTE — Group Therapy Note (Signed)
Mackenzie Gibson, Mackenzie Gibson NO.:  [id number]   MEDICAL RECORD NO.:  [id number]          PATIENT TYPE:  WOC   LOCATION:  [facility]                   FACILITY:  [facility]   PHYSICIAN:  Elsie Lincoln, MD      DATE OF BIRTH:  Jul 25, 1985   DATE OF SERVICE:  01/10/2004                                    CLINIC NOTE   The patient is a 25 year old para 0 female who was sent from the health  department for amenorrhea since July 2005.  Patient had menarche at age 74.  Had normal cycles up until July 2004.  She was then placed on birth control  pills and had regulated cycles until she stopped in April of 2005.  Patient  had a period in May 2005, skipped in June 2005, and had her last period in  July 2005.  Since July 2005 she has had multiple systemic complaints.  These  include moderate to severe acne, a 50 pound weight gain since August 2005,  abdominal striae, and amenorrhea.  Patient did have a problem with  constipation.  This has been resolved, though, for the past several months.  Patient desires to be pregnant and has had multiple negative pregnancy tests  both at the health department and at her house.   PAST MEDICAL HISTORY:  Denies.   PAST SURGICAL HISTORY:  Denies.   PAST GYN HISTORY:  In 2002 she had Trichomonas that was resistant and her  and her partner were both treated and now eventually they have cleared this  problem.  She states she got a recent letter from the health department  stating she had abnormal cells on her Pap smear.  We will get this result to  properly address.  Recent ultrasound revealed a 1.9 mm endometrial stripe  with normal ovaries with no dominant follicles.   PHYSICAL EXAMINATION:  VITAL SIGNS:  Temperature 99, pulse 83, blood  pressure 113/78, weight 232, height 5 feet 6.5 inches.  ABDOMEN:  Obese, soft, nontender.  Abdominal striae.  GENITALIA:  Tanner V.  Vagina pink.  No lesions.  Cervix closed.  Cervical  examination is nontender.   Uterus and adnexa:  She is diffusely tender in  the lower quadrants as she has been having this pain for several weeks.  However, there is no rebound, no guarding, and no masses felt.  Examination  was difficult secondary to body habitus.   ASSESSMENT/PLAN:  1.  A 25 year old female with amenorrhea and pelvic pain.  Doubt polycystic      ovarian disease due to this large weight gain, abdominal striae.      However, will not rule out.  Will test for the following things.  Will      check TSH, prolactin, free testosterone, 70-hydroxyprogesterone.  Will      also get a fasting a.m. cortisol level and a fasting two-hour GTT with a      fasting insulin.  Will also check beta hCG.  2.  Get a CT with contrast intravenous and oral to evaluate for pelvic pain      and any adrenal mass.  3.  Return to clinic in two weeks for test results and evaluation.  4.  Get Pap test results from [facility].      KL/MEDQ  D:  01/10/2004  T:  01/11/2004  Job:  [id number]

## 2010-07-20 NOTE — H&P (Signed)
NAMELAKRISHA, Gibson             ACCOUNT NO.:  [id number]   MEDICAL RECORD NO.:  [id number]          PATIENT TYPE:  MAT   LOCATION:  [facility]                          [facility]   PHYSICIAN:  Roseanna Rainbow, M.D.DATE OF BIRTH:  05/09/1985   DATE OF ADMISSION:  12/25/2004  DATE OF DISCHARGE:                                HISTORY & PHYSICAL   CHIEF COMPLAINT:  The patient is a 25 year old, gravida 1, para 0, with an  estimated date of confinement of March 06, 2005, who presents complaining  of decreased fetal movement and lower abdominal cramping.   HISTORY OF PRESENT ILLNESS:  Please see the above.  The patient presented to  the maternity admissions unit yesterday evening with similar complaints  including vaginal spotting.  Last coitus was 2 weeks ago.   ANTEPARTUM COURSE:  Onset of care at 8+ weeks.  Prenatal screens: Antibody  screen negative.  Blood type A positive.  Hemoglobin 13, hematocrit 39.1.  Chlamydia and GC negative.  One-hour GTT 122.  Hepatitis B surface antigen  negative.  HIV negative.  RPR nonreactive. Rubella immune.  Ultrasound on  July 24 and August 14 were within normal limits.  RPR nonreactive.   PAST GYN HISTORY:  History of PCOS with heavy irregular menses.  No history  of obstetrical or gynecological surgery. Pap smear September 2005: History  of trichomoniasis and chlamydia.   PAST MEDICAL HISTORY:  Urinary tract infection.   PAST SURGICAL HISTORY:  Laser surgery, right eye.   SOCIAL HISTORY:  She is unemployed, married, living with her spouse.  Does  not give significant history of alcohol use.  No significant smoking  history. Denies illicit drug use.   FAMILY HISTORY:  Colon cancer, cervical cancer, prostate cancer, CVA,  diabetes mellitus, hypercholesterolemia, heart disease, ischemic heart  disease, kidney failure.   ALLERGIES:  HYDROCODONE. She has a secondary anaphylactoid reaction.   MEDICATIONS:  Prenatal vitamins.   PHYSICAL  EXAMINATION:  VITAL SIGNS:  Blood pressure 127/71, temperature  97.4, pulse 115, weight 248 pounds.  GENERAL:  No apparent distress.  ABDOMEN:  Gravid.  PELVIS: Speculum examination: There is yellow curdy discharge, vaginal  mucosa erythema.   On informal transvaginal ultrasound, the internal os is dilated to 1.4 cm.  The cervix is dynamic.   Nonstress test: Positive fetal movement.  Baseline at 130; tocodynamometer  no uterine contractions.   ASSESSMENT:  Intrauterine pregnancy at 26+ weeks with threatened preterm  labor.   PLAN:  1.  Admission.  2.  Magnesium sulfate tocolysis.  3.  Ibuprofen.  4.  Steroids.  5.  Complete obstetrical ultrasound.  6.  Bed rest.  7.  Broad-spectrum parenteral antibiotics.      Roseanna Rainbow, M.D.  Electronically Signed     LAJ/MEDQ  D:  12/25/2004  T:  12/25/2004  Job:  [id number]

## 2010-07-20 NOTE — Group Therapy Note (Signed)
NAMETISHANNA, DUNFORD NO.:  [id number]   MEDICAL RECORD NO.:  [id number]          PATIENT TYPE:  WOC   [facility]ATION:  [facility]                   [facility]ACILITY:  [facility]   PHYSICIAN:  Elsie Lincoln, MD      DATE OF BIRTH:  [birth year]   DATE OF SERVICE:  01/24/2004                                    CLINIC NOTE   REASON FOR VISIT:  The patient is a 25 year old para 0 female with  amenorrhea who came initially on January 10, 2004 and had a battery of tests  done.  She presents today for follow up.  Once again she has severe acne, a  50-pound weight gain since August 2005, abdominal striae and amenorrhea.  The following labs are received:  A fasting a.m. cortisol was 20.2 which was  normal, her fasting glucose was 95, her q. hour glucose after 75 gram load  was 79, her insulin random was 19.  This was supposed to have been done  fasting and if it was done fasting her ratio was 5.0 which shows borderline  glucose to insulin metabolism.  Her TSH was normal at 3.6, her prolactin was  normal at 8.6.  Her beta hCG was negative.  Androstanediol was 2.5 which was  normal.  Her 17-hydroxyprogesterone is 288 which was elevated for someone in  the phase.  The high normal for a follicular female is 120.  Spoke to the  patient about diagnosis of PCOS versus nonclassical adrenal hyperplasia  before doing an ACTH stimulation test we will give the patient a withdrawal  bleed to make sure that there is indeed enough estrogen on board and there  is no hypothalamic pituitary problem.  This will also rule out premature  ovarian failure.  The patient given a prescription for Provera and to return  to clinic in 4 weeks.      KL/MEDQ  D:  02/21/2004  T:  02/22/2004  Job:  [id number]

## 2010-07-20 NOTE — Group Therapy Note (Signed)
Mackenzie Gibson, NEAL NO.:  [id number]   MEDICAL RECORD NO.:  [id number]          PATIENT TYPE:  WOC   LOCATION:  [facility]                   FACILITY:  [facility]   PHYSICIAN:  Elsie Lincoln, MD      DATE OF BIRTH:  August 15, 1985   DATE OF SERVICE:  02/21/2004                                    CLINIC NOTE   REASON FOR VISIT:  The patient is a 25 year old female status post Provera  withdrawal bleed.  The patient had a 5-day period from December 5 to  February 11, 2004.  She had moderate cramping; however, the period was  normal flow for her.  The patient presents today desiring further workup for  her amenorrhea, weight gain, abdominal striae, and acne.  At this time we  will proceed with an ACTH stimulation test.  We will do this in the [facility].  We  will curbside the endocrinologist in town to make sure there is no other  test that should be done before undergoing this more invasive test.  She has  no insurance and historically the endocrinologist will see these patients.  The patient's husband and mother-in-law are here today and we had a 30 to 45  minute discussion with the patient about what is going on in her life.  She  is extremely tearful and occasionally has thoughts of suicide.  She  envisions herself driving rapidly and falling off a cliff.  She also has  experiences with cutting - not for suicide but to relieve her emotional  pain.  She has been having this for several months.  At this point she  refuses all antidepressants.  I wrote a prescription for Effexor 37.5 mg  today and gave it to the mother-in-law who will hold it and as they discuss  this further, maybe she will get it filled.  Anna __________, the Arts development officer, was paged several times to come talk to the patient.  The patient is  waiting for this discussion.  The patient is not suicidal at this point.  However, I do believe she is in need of medical attention for her  depression.  She also needs  extensive counseling.  The patient has a good  support network as visualized with the husband and the mother-in-law.  We  are waiting for Anna __________ at this time.  We will schedule ACTH  stimulation test.      KL/MEDQ  D:  02/21/2004  T:  02/22/2004  Job:  [id number]

## 2010-07-20 NOTE — Op Note (Signed)
NAMEDUANNA, RUNK             ACCOUNT NO.:  [id number]   MEDICAL RECORD NO.:  [id number]          PATIENT TYPE:  INP   LOCATION:  5734                         FACILITY:  [facility]   PHYSICIAN:  Cherylynn Ridges, M.D.    DATE OF BIRTH:  January 13, 1986   DATE OF PROCEDURE:  07/05/2006  DATE OF DISCHARGE:                               OPERATIVE REPORT   PREOPERATIVE DIAGNOSIS:  Cholelithiasis with abnormal liver function  tests.   POSTOPERATIVE DIAGNOSIS:  Cholelithiasis with no flow into the duodenum  and possible common bile duct obstruction.   PROCEDURE:  Laparoscopic cholecystectomy with cholangiogram.   ASSISTANT:  Dr. Manus Rudd.   ANESTHESIA:  General endotracheal.   ESTIMATED BLOOD LOSS:  Less than 20 mL.   COMPLICATIONS:  None.   CONDITION:  Stable.   SPECIMEN:  Gallbladder plus stones sent to pathology.   INDICATIONS FOR OPERATION:  The patient is a 25 year old with right  upper quadrant pain and tenderness who had abnormal liver function  tests, most of which had improved today, normal bilirubin today, who  comes in for laparoscopic cholecystectomy.   FINDINGS:  The gallbladder did not appear to be acutely inflamed,  however, on cholangiogram there was no flow into the duodenum.  The  common duct appeared to be slightly higher than normal in caliber and  there appeared to be distal obstructing structure, stone or mass.   OPERATION:  The patient was taken to the operating room, placed on the  table in supine position.  After an adequate endotracheal anesthetic was  administered, she was prepped and draped in usual sterile manner  exposing the midline and right upper quadrant.   A supraumbilical incision was made using an 11 blade and taken down to  the midline fascia.  It was at the fascial level that we grabbed the  fascia with Kocher clamp x2 then between the clamps we made an incision  using #15 blade.   While tenting up on the abdominal fascial edges, we  entered the  preperitoneal space and entered the peritoneal cavity bluntly using a  Kelly clamp.  We then used a UR-6 needle to pass an 0 Vicryl pursestring  suture around the fascial opening.  This was used to secure the [facility]  cannula which was passed with minimal difficulty.   Once the [facility] cannula was secured in place, we insufflated carbon  dioxide gas up to maximal intra-abdominal pressure of 50 mmHg.  Once  this was done up to right costal margin, 5-mm cannulas and a subxiphoid  11/12 mm cannula were passed under direct vision into the peritoneal  cavity.  Once all cannula were in place, the dissection was done with  the patient placed in reverse Trendelenburg and the left side tilted  down.   The dome of the gallbladder was grasped and retracted towards right  upper quadrant and then a second one placed on the infundibulum.  We  dissected out the peritoneum overlying the triangle of Fallot and  hepatic duodenal triangle, isolating the cystic duct and cystic artery.  The clip was placed on the gallbladder side  of the cystic duct and then  a cholecystodochotomy made using laparoscopic scissors.  The [facility]  catheter which had been passed through the anterior abdominal wall was  passed into the cholecystodochotomy, at which point the cholangiogram  was attempted.  We had good flow into the common bile duct system, also  proximally into the hepatic ducts right and left side with good proximal  fill, some slight dilatation of the intrahepatic ducts.  However, there  was no flow into the duodenum.   After the initial cholangiogram, the patient was given a milligram of  glucagon IV by the anesthesiologist and then allowed to circulate for 3-  5 minutes.  A repeat cholangiogram done without additional injection  showed continued no flow into the duodenum and then we injected again;  there was still not flow.   An intraoperative consultation was requested from Dr. Wandalee Ferdinand who is   on call today for GI.  He will see the patient in her room  postoperatively.   Once we had completed the cholangiogram, three Endo clips were placed on  the cystic duct.  It showed good occlusion of the cystic duct.  We then  Endo clipped the cystic artery, then transected it and dissected out the  gallbladder from its bed with minimal difficulty.  We removed the  gallbladder from the supraumbilical fascial site using an EndoCatch bag.  Once this was done and we removed the bag completely and the gallbladder  completely, we used a pursestring suture to tie up the supraumbilical  site.  We aspirated all fluid and gas from around the liver.  There was  no bilious drainage whatsoever.  Once this was done, we aspirate all  fluid and gas from above the liver and removed all cannulas.   The supraumbilical and subxiphoid incision sites were injected with  0.25% Marcaine with epi as were the lateral trocar sites and then the  skin closed at the supraumbilical and subxiphoid sites using running  subcuticular stitch of 4-0 Vicryl. The lateral sites were closed with  Dermabond.  Sterile dressings were applied to all wounds.  All needle  counts, sponge counts and instrument counts were correct.      Cherylynn Ridges, M.D.  Electronically Signed     JOW/MEDQ  D:  07/05/2006  T:  07/06/2006  Job:  [id number]

## 2010-07-20 NOTE — Discharge Summary (Signed)
Mackenzie Gibson, Mackenzie Gibson             ACCOUNT NO.:  [id number]   MEDICAL RECORD NO.:  [id number]          PATIENT TYPE:  INP   LOCATION:  9158                          [facility]ACILITY[facility]   PHYSICIAN:  Roseanna Rainbow, M.D.DATE OF BIRTH:  Jul 25, 1985   DATE OF ADMISSION:  12/25/2004  DATE OF DISCHARGE:  12/29/2004                                 DISCHARGE SUMMARY   CHIEF COMPLAINT:  The patient is a 25 year old gravida 1, para 0 with an  estimated date of confinement of March 06, 2005 who presents complaining of  decreased fetal movement and lower abdominal cramping. Please see the  dictated history and physical as per myself for further details.   HOSPITAL COURSE:  The patient was admitted. The patient declined tocolytics.  A complete OB ultrasound demonstrated an appropriately grown fetus,  estimated fetal weight percentile between the 75th and the 90th percentile  for 30 weeks, and a cervical length of 3.5 cm transvaginally. The patient's  cramping resolved. The tocodynamometer failed to demonstrate regular uterine  contractions. She has received a course of steroids as well as parenteral  clindamycin. She developed a rash which was felt to be secondary to the  clindamycin, and the clindamycin was discontinued. Her blood pressures were  somewhat labile during her hospital course ranging between 110s to 140s over  60s to 80s. A PIH panel was normal. A 24-hour urine had a borderline amount  of protein. She was spilling 252 mg per day. A repeat ultrasound for  cervical length on the day of discharge showed the cervix to be 2.7 cm long.   DISCHARGE DIAGNOSES:  1.  Intrauterine pregnancy at 30+ weeks.  2.  Threatened preterm labor.  3.  Pregnancy-induced hypertension, mild.   CONDITION:  Stable.   DIET:  Regular.   ACTIVITY:  Bed rest, pelvic rest.   MEDICATIONS:  Resume home medications.   DISPOSITION:  The patient was to follow up in the office in several days.      Roseanna Rainbow, M.D.  Electronically Signed     LAJ/MEDQ  D:  12/29/2004  T:  12/29/2004  Job:  [id number]

## 2010-11-22 LAB — URINALYSIS, ROUTINE W REFLEX MICROSCOPIC
Glucose, UA: NEGATIVE
Ketones, ur: NEGATIVE
Nitrite: NEGATIVE
Specific Gravity, Urine: 1.025
pH: 6

## 2010-11-22 LAB — CBC
HCT: 41.2
MCHC: 34
MCV: 78.1
Platelets: 243
WBC: 11.9 — ABNORMAL HIGH

## 2010-11-22 LAB — DIFFERENTIAL
Basophils Relative: 0
Eosinophils Absolute: 0.2
Eosinophils Relative: 1
Lymphs Abs: 2.7
Monocytes Relative: 5
Neutrophils Relative %: 71

## 2010-11-22 LAB — I-STAT 8, (EC8 V) (CONVERTED LAB)
BUN: 10
Bicarbonate: 26.7 — ABNORMAL HIGH
Glucose, Bld: 101 — ABNORMAL HIGH
Operator id: 270651
TCO2: 28
pCO2, Ven: 42.5 — ABNORMAL LOW

## 2010-11-22 LAB — URINE MICROSCOPIC-ADD ON

## 2010-11-22 LAB — POCT I-STAT CREATININE: Creatinine, Ser: 0.8

## 2010-11-23 LAB — POCT PREGNANCY, URINE
Operator id: 235561
Preg Test, Ur: NEGATIVE

## 2010-11-30 LAB — URINALYSIS, ROUTINE W REFLEX MICROSCOPIC
Glucose, UA: NEGATIVE
Ketones, ur: NEGATIVE
Protein, ur: NEGATIVE

## 2010-11-30 LAB — COMPREHENSIVE METABOLIC PANEL
ALT: 18
AST: 21
Albumin: 3.7
Calcium: 9.4
GFR calc Af Amer: >60
Potassium: 3.8
Sodium: 139
Total Protein: 6.8

## 2010-11-30 LAB — DIFFERENTIAL
Basophils Relative: 1
Eosinophils Absolute: 0.2
Eosinophils Relative: 1
Lymphs Abs: 2.7

## 2010-11-30 LAB — CBC
HCT: 39.5
MCHC: 34.9
MCV: 79.3
Platelets: 260
RDW: 14
WBC: 14.6 — ABNORMAL HIGH

## 2010-12-05 LAB — RAPID URINE DRUG SCREEN, HOSP PERFORMED
Amphetamines: NOT DETECTED
Cocaine: NOT DETECTED
Opiates: NOT DETECTED
Tetrahydrocannabinol: NOT DETECTED

## 2010-12-05 LAB — POCT URINALYSIS DIP (DEVICE)
Bilirubin Urine: NEGATIVE
Nitrite: NEGATIVE
Protein, ur: NEGATIVE
pH: 6

## 2010-12-10 LAB — POCT RAPID STREP A: Streptococcus, Group A Screen (Direct): NEGATIVE

## 2010-12-11 LAB — POCT RAPID STREP A: Streptococcus, Group A Screen (Direct): NEGATIVE

## 2010-12-13 LAB — COMPREHENSIVE METABOLIC PANEL
ALT: 24
Alkaline Phosphatase: 61
CO2: 26
GFR calc non Af Amer: >60
Glucose, Bld: 96
Potassium: 4.1
Sodium: 136
Total Protein: 6.8

## 2010-12-13 LAB — DIFFERENTIAL
Basophils Relative: 1
Eosinophils Absolute: 0.1
Eosinophils Relative: 1
Monocytes Relative: 4
Neutrophils Relative %: 82 — ABNORMAL HIGH

## 2010-12-13 LAB — CBC
Hemoglobin: 13.3
RBC: 4.9
WBC: 12.6 — ABNORMAL HIGH

## 2010-12-13 LAB — POCT CARDIAC MARKERS: Operator id: 1192

## 2010-12-17 LAB — URINALYSIS, ROUTINE W REFLEX MICROSCOPIC
Bilirubin Urine: NEGATIVE
Hgb urine dipstick: NEGATIVE
Ketones, ur: NEGATIVE
Protein, ur: NEGATIVE
Urobilinogen, UA: 0.2

## 2010-12-17 LAB — POCT PREGNANCY, URINE: Preg Test, Ur: NEGATIVE

## 2010-12-19 LAB — DIFFERENTIAL
Basophils Absolute: 0.1
Basophils Relative: 1
Eosinophils Absolute: 0.2
Eosinophils Relative: 2
Monocytes Absolute: 0.6
Neutro Abs: 8.9 — ABNORMAL HIGH

## 2010-12-19 LAB — URINALYSIS, ROUTINE W REFLEX MICROSCOPIC
Bilirubin Urine: NEGATIVE
Ketones, ur: NEGATIVE
Nitrite: NEGATIVE
Protein, ur: NEGATIVE
Specific Gravity, Urine: 1.017
Urobilinogen, UA: 0.2

## 2010-12-19 LAB — CBC
HCT: 41.7
Hemoglobin: 14
Platelets: 255
WBC: 13.2 — ABNORMAL HIGH

## 2010-12-19 LAB — COMPREHENSIVE METABOLIC PANEL
Albumin: 3.8
Alkaline Phosphatase: 73
BUN: 8
Chloride: 106
Potassium: 3.9
Total Bilirubin: 0.6

## 2010-12-19 LAB — POCT PREGNANCY, URINE: Operator id: 277751

## 2012-06-18 ENCOUNTER — Emergency Department (HOSPITAL_COMMUNITY)
Admission: EM | Admit: 2012-06-18 | Discharge: 2012-06-19 | Disposition: A | Discharge status: Home / Self Care | Payer: Self-pay | Attending: Emergency Medicine | Admitting: Emergency Medicine

## 2012-06-18 ENCOUNTER — Encounter (HOSPITAL_COMMUNITY): Payer: Self-pay | Admitting: Emergency Medicine

## 2012-06-18 DIAGNOSIS — F17200 Nicotine dependence, unspecified, uncomplicated: Secondary | ICD-10-CM | POA: Insufficient documentation

## 2012-06-18 DIAGNOSIS — Z8742 Personal history of other diseases of the female genital tract: Secondary | ICD-10-CM | POA: Insufficient documentation

## 2012-06-18 DIAGNOSIS — Z8659 Personal history of other mental and behavioral disorders: Secondary | ICD-10-CM | POA: Insufficient documentation

## 2012-06-18 DIAGNOSIS — Y838 Other surgical procedures as the cause of abnormal reaction of the patient, or of later complication, without mention of misadventure at the time of the procedure: Secondary | ICD-10-CM | POA: Insufficient documentation

## 2012-06-18 DIAGNOSIS — T888XXA Other specified complications of surgical and medical care, not elsewhere classified, initial encounter: Secondary | ICD-10-CM | POA: Insufficient documentation

## 2012-06-18 DIAGNOSIS — T82898A Other specified complication of vascular prosthetic devices, implants and grafts, initial encounter: Secondary | ICD-10-CM

## 2012-06-18 DIAGNOSIS — R21 Rash and other nonspecific skin eruption: Secondary | ICD-10-CM | POA: Insufficient documentation

## 2012-06-18 NOTE — ED Notes (Signed)
PT. REPORTS RIGHT FOREARM PAIN ONSET THIS MORNING " I FEEL A KNOT " , PT, STATES SHE DONATED PLASMA ON THE SAME ARM LAST Tuesday.

## 2012-06-18 NOTE — ED Notes (Signed)
Pt states she donates plasma on a regular basis, last donated on Tuesday- they told her that her vein collapsed.  Since donating pt has noticed a knot to her left forearm- states it is painful.

## 2012-06-19 NOTE — ED Provider Notes (Signed)
History     CSN: [id number]  Arrival date & time 06/18/12  2208   First MD Initiated Contact with Patient 06/19/12 0153      Chief Complaint  Patient presents with  . Arm Pain    (Consider location/radiation/quality/duration/timing/severity/associated sxs/prior treatment) Patient is a 27 y.o. female presenting with arm pain. The history is provided by the patient and medical records. No language interpreter was used.  Arm Pain This is a new problem. The current episode started yesterday. The problem occurs constantly. The problem has been unchanged. Associated symptoms include a rash. Pertinent negatives include no abdominal pain, anorexia, arthralgias, change in bowel habit, chest pain, chills, congestion, coughing, diaphoresis, fatigue, fever, headaches, joint swelling, myalgias, nausea, neck pain, numbness, sore throat, swollen glands, urinary symptoms, vertigo, visual change, vomiting or weakness. Exacerbated by: palaption. She has tried nothing for the symptoms. The treatment provided no relief.    Mackenzie Gibson is a 27 y.o. female  with a hx of PCOS, depression presents to the Emergency Department complaining of gradual, persistent, progressively worsening right arm pain onset yesterday morning, 2 days after donating plasma and being unable to compete the procedure 2/2 to problems with the machine or her vein.  Pt states she donates regularly and has never had a problem in the past.  Pt states no pain initially, but pain worsens with palpation and nothing makes it better. Associated symptoms include swelling in the forearm and pain.  Pt denies fever, chills, headache, neck pain, neck stiffness, SOB, chest pain, abd pain, N/V/D, weakness, dizziness, syncope.  Pt also c/o rash over the site of the swelling.   Past Medical History  Diagnosis Date  . PCOS (polycystic ovarian syndrome)   . Depression     History reviewed. No pertinent past surgical history.  Family History    Problem Relation Age of Onset  . Depression Mother   . Depression Father     History  Substance Use Topics  . Smoking status: Current Every Day Smoker -- 0.30 packs/day    Types: Cigarettes  . Smokeless tobacco: Not on file  . Alcohol Use: No    OB History   Grav Para Term Preterm Abortions TAB SAB Ect Mult Living   2 2 2  0 0 0 0 0 0 2      Review of Systems  Constitutional: Negative for fever, chills, diaphoresis and fatigue.  HENT: Negative for congestion, sore throat and neck pain.   Respiratory: Negative for cough.   Cardiovascular: Negative for chest pain.  Gastrointestinal: Negative for nausea, vomiting, abdominal pain, anorexia and change in bowel habit.  Musculoskeletal: Negative for myalgias, joint swelling and arthralgias.  Skin: Positive for rash.  Neurological: Negative for vertigo, weakness, numbness and headaches.    Allergies  Hydrocodone  Home Medications   Current Outpatient Rx  Name  Route  Sig  Dispense  Refill  . flintstones complete (FLINTSTONES) 60 MG chewable tablet   Oral   Chew 2 tablets by mouth daily.           BP 120/77  Pulse 67  Temp(Src) 98 F (36.7 C) (Oral)  Resp 18  SpO2 99%  LMP 06/11/2012  Physical Exam  Nursing note and vitals reviewed. Constitutional: She is oriented to person, place, and time. She appears well-developed and well-nourished. No distress.  HENT:  Head: Normocephalic and atraumatic.  Mouth/Throat: Oropharynx is clear and moist. No oropharyngeal exudate.  Eyes: Conjunctivae are normal. Pupils are equal, round, and reactive  to light. No scleral icterus.  Neck: Normal range of motion. Neck supple.  Cardiovascular: Normal rate, regular rhythm, normal heart sounds and intact distal pulses.   No murmur heard. Pulmonary/Chest: Effort normal and breath sounds normal. No respiratory distress. She has no wheezes. She has no rales.  Abdominal: Soft. Bowel sounds are normal. She exhibits no distension.   Musculoskeletal: Normal range of motion. She exhibits tenderness. She exhibits no edema.       Left forearm: She exhibits tenderness and swelling. She exhibits no bony tenderness, no edema, no deformity and no laceration.       Arms: Pt with mild swelling to the L forearm, not in the location of the venipuncture; no erythema, ecchymosis, or evidence of cellulitis  No deep pockets of puss noted on US   Lymphadenopathy:    She has no cervical adenopathy.  Neurological: She is alert and oriented to person, place, and time. She exhibits normal muscle tone. Coordination normal.  Speech is clear and goal oriented Moves extremities without ataxia  Skin: Skin is warm and dry. She is not diaphoretic. No erythema.  Psychiatric: She has a normal mood and affect.    ED Course  Procedures (including critical care time)  Labs Reviewed - No data to display No results found.   1. Extravasation injury of IV catheter site with other complication, initial encounter       MDM  [name] resents with mild swelling of the left arm after venipuncture. Ears the patient had a mild extravasation injury from an IV catheter. His no evidence of cellulitis or other infection. Inspection of the site with ultrasound shows that the vein is compressible throughout and DVT is unlikely. Assessment with ultrasound of the swelling does not show pockets of pus; therefore do not believe that she has an abscess. Patient afebrile, nontoxic, nonseptic appearing. Recommend conservative therapy heat and elevation. She is to followup with her primary care physician if symptoms persist.  I have also discussed reasons to return immediately to the ER.  Patient expresses understanding and agrees with plan.          Dahlia Client Mckinzey Entwistle, PA-C 06/19/12 [phone]

## 2012-06-19 NOTE — ED Provider Notes (Signed)
Medical screening examination/treatment/procedure(s) were performed by non-physician practitioner and as supervising physician I was immediately available for consultation/collaboration.   Hanley Seamen, MD 06/19/12 [phone]

## 2012-08-25 ENCOUNTER — Emergency Department (HOSPITAL_COMMUNITY): Payer: Self-pay

## 2012-08-25 ENCOUNTER — Encounter (HOSPITAL_COMMUNITY): Payer: Self-pay | Admitting: Emergency Medicine

## 2012-08-25 ENCOUNTER — Emergency Department (HOSPITAL_COMMUNITY)
Admission: EM | Admit: 2012-08-25 | Discharge: 2012-08-26 | Disposition: A | Discharge status: Home / Self Care | Payer: Self-pay | Attending: Emergency Medicine | Admitting: Emergency Medicine

## 2012-08-25 DIAGNOSIS — S93409A Sprain of unspecified ligament of unspecified ankle, initial encounter: Secondary | ICD-10-CM | POA: Insufficient documentation

## 2012-08-25 DIAGNOSIS — F17200 Nicotine dependence, unspecified, uncomplicated: Secondary | ICD-10-CM | POA: Insufficient documentation

## 2012-08-25 DIAGNOSIS — Y9229 Other specified public building as the place of occurrence of the external cause: Secondary | ICD-10-CM | POA: Insufficient documentation

## 2012-08-25 DIAGNOSIS — Y9301 Activity, walking, marching and hiking: Secondary | ICD-10-CM | POA: Insufficient documentation

## 2012-08-25 DIAGNOSIS — Z8659 Personal history of other mental and behavioral disorders: Secondary | ICD-10-CM | POA: Insufficient documentation

## 2012-08-25 DIAGNOSIS — Z8742 Personal history of other diseases of the female genital tract: Secondary | ICD-10-CM | POA: Insufficient documentation

## 2012-08-25 DIAGNOSIS — W010XXA Fall on same level from slipping, tripping and stumbling without subsequent striking against object, initial encounter: Secondary | ICD-10-CM | POA: Insufficient documentation

## 2012-08-25 MED ORDER — OXYCODONE-ACETAMINOPHEN 5-325 MG PO TABS
2.0000 | ORAL_TABLET | Freq: Once | ORAL | Status: DC
  Filled 2012-08-25: qty 2

## 2012-08-25 MED ORDER — NAPROXEN 500 MG PO TABS: 500.0000 mg | ORAL_TABLET | Freq: Two times a day (BID) | ORAL | Status: DC

## 2012-08-25 NOTE — ED Notes (Signed)
PT ambulated with baseline gait; VSS; A&Ox3; no signs of distress; respirations even and unlabored; skin warm and dry; no questions upon discharge.  

## 2012-08-25 NOTE — ED Notes (Signed)
Pt name called with no answer x1

## 2012-08-25 NOTE — ED Notes (Signed)
Patient approached the desk and reported she could not hear her name being called.

## 2012-08-25 NOTE — ED Notes (Signed)
PT. TRIPPED AT [facility] THIS EVEN[facility] , NO LOC /AMBULATORY , PRESENTS WITH SWELLING / PAIN AT RIGHT ANKLE .

## 2012-08-25 NOTE — ED Provider Notes (Signed)
History    This chart was scribed for a non-physician practitioner, Dierdre Forth, PA-C, working with Carleene Cooper III, MD by Frederik Pear, ED Scribe. This patient was seen in room TR04C/TR04C and the patient's care was started at 2248.  CSN: [id number] Arrival date & time 08/25/12  8295  First MD Initiated Contact with Patient 08/25/12 2248     Chief Complaint  Patient presents with  . Ankle Pain   (Consider location/radiation/quality/duration/timing/severity/associated sxs/prior Treatment) Patient is a 26 y.o. female presenting with ankle pain. The history is provided by the patient and medical records. No language interpreter was used.  Ankle Pain Location:  Ankle Injury: yes   Mechanism of injury: fall   Fall:    Height of fall:  From height   Entrapped after fall: no   Ankle location:  R ankle Chronicity:  New Foreign body present:  No foreign bodies Associated symptoms: no back pain, no fever and no neck pain     HPI Comments: CINDIA Mackenzie Gibson is a 27 y.o. female who presents to the Emergency Department complaining of constant, burning, severe, non-radiating right ankle pain with swelling that is aggravated by walking and movement of the joint and alleviated by holding it still that began at 1900 when she tripped over a box while walking in [employer] and fell into a display. She denies hitting her head and LOC. She has no chronic medical conditions that she require daily medications. She is allergic to hydrocodone.     Past Medical History  Diagnosis Date  . PCOS (polycystic ovarian syndrome)   . Depression    Past Surgical History  Procedure Laterality Date  . Cholecystectomy    . Eye surgery     Family History  Problem Relation Age of Onset  . Depression Mother   . Depression Father    History  Substance Use Topics  . Smoking status: Current Every Day Smoker -- 0.30 packs/day    Types: Cigarettes  . Smokeless tobacco: Not on file  . Alcohol  Use: No   OB History   Grav Para Term Preterm Abortions TAB SAB Ect Mult Living   2 2 2  0 0 0 0 0 0 2     Review of Systems  Constitutional: Negative for fever.  HENT: Negative for neck pain and neck stiffness.   Gastrointestinal: Negative for nausea, vomiting and diarrhea.  Musculoskeletal: Positive for joint swelling and arthralgias. Negative for back pain.  Skin: Negative for wound.  Neurological: Negative for numbness.  All other systems reviewed and are negative.    Allergies  Hydrocodone  Home Medications   Current Outpatient Rx  Name  Route  Sig  Dispense  Refill  . naproxen sodium (ANAPROX) 220 MG tablet   Oral   Take 440 mg by mouth daily as needed (menstrual pain).         . naproxen (NAPROSYN) 500 MG tablet   Oral   Take 1 tablet (500 mg total) by mouth 2 (two) times daily with a meal.   30 tablet   0    BP 132/88  Pulse 83  Temp(Src) 98.3 F (36.8 C) (Oral)  Resp 12  SpO2 100%  LMP 08/07/2012 Physical Exam  Nursing note and vitals reviewed. Constitutional: She appears well-developed and well-nourished. No distress.  HENT:  Head: Normocephalic and atraumatic.  Eyes: Conjunctivae are normal.  Neck: Normal range of motion.  Cardiovascular: Normal rate, regular rhythm and intact distal pulses.   Capillary  refill less than 3  Pulmonary/Chest: Effort normal and breath sounds normal.  Musculoskeletal: She exhibits tenderness. She exhibits no edema.  Full ROM with pain. Swelling to the lateral malleolus. No swelling to the medial malleolus. No ecchymosis. No defect to the Achilles tendon. Negative Thompson's test.  Tenderness to the lateral malleolus.  Neurological: She is alert. Coordination normal.  Sensation is intact. Strength is 5/5 in the lower extremities.   Skin: Skin is warm and dry. She is not diaphoretic.  No tenting of the skin  Psychiatric: She has a normal mood and affect.    ED Course  Procedures (including critical care  time)  DIAGNOSTIC STUDIES: Oxygen Saturation is 100% on room air, normal by my interpretation.    COORDINATION OF CARE:  23:00- Discussed planned course of treatment with the patient, including Percocet, crutches, and an ASO, who is agreeable at this time.  23:15- Medication Orders- oxycodone-acetaminophen (Percocet/Roxicet) 5-325 mg her tablet 2 tablet- once.  Labs Reviewed - No data to display Dg Ankle Complete Right  08/25/2012   *RADIOLOGY REPORT*  Clinical Data: Ankle injury with pain  RIGHT ANKLE - COMPLETE 3+ VIEW  Comparison: None.  Findings: Negative for fracture.  Normal alignment and no degenerative change.  Negative for joint effusion.  IMPRESSION: Negative   Original Report Authenticated By: Janeece Riggers, M.D.   1. Ankle sprain and strain, right, initial encounter     MDM  [name] presents with ankle pain after a fall.  Patient X-Ray negative for obvious fracture or dislocation. I personally reviewed the imaging tests through PACS system.  I reviewed available ER/hospitalization records through the EMR.  Pain managed in ED. Pt advised to follow up with orthopedics if symptoms persist for possibility of missed fracture diagnosis. Patient given ASO brace and crutches while in ED, conservative therapy recommended and discussed. Patient will be dc home & is agreeable with above plan. I have also discussed reasons to return immediately to the ER.  Patient expresses understanding and agrees with plan.  I personally performed the services described in this documentation, which was scribed in my presence. The recorded information has been reviewed and is accurate.    Mackenzie Client Lochlan Grygiel, PA-C 08/25/12 2333

## 2012-08-26 NOTE — ED Provider Notes (Signed)
Medical screening examination/treatment/procedure(s) were performed by non-physician practitioner and as supervising physician I was immediately available for consultation/collaboration.   Carleene Cooper III, MD 08/26/12 1020

## 2012-11-24 ENCOUNTER — Emergency Department (HOSPITAL_COMMUNITY): Payer: Self-pay

## 2012-11-24 ENCOUNTER — Emergency Department (HOSPITAL_COMMUNITY)
Admission: EM | Admit: 2012-11-24 | Discharge: 2012-11-24 | Disposition: A | Discharge status: Home / Self Care | Payer: Self-pay | Attending: Emergency Medicine | Admitting: Emergency Medicine

## 2012-11-24 ENCOUNTER — Encounter (HOSPITAL_COMMUNITY): Payer: Self-pay | Admitting: Emergency Medicine

## 2012-11-24 DIAGNOSIS — J069 Acute upper respiratory infection, unspecified: Secondary | ICD-10-CM | POA: Insufficient documentation

## 2012-11-24 DIAGNOSIS — J209 Acute bronchitis, unspecified: Secondary | ICD-10-CM | POA: Insufficient documentation

## 2012-11-24 DIAGNOSIS — F3289 Other specified depressive episodes: Secondary | ICD-10-CM | POA: Insufficient documentation

## 2012-11-24 DIAGNOSIS — J40 Bronchitis, not specified as acute or chronic: Secondary | ICD-10-CM

## 2012-11-24 DIAGNOSIS — R062 Wheezing: Secondary | ICD-10-CM | POA: Insufficient documentation

## 2012-11-24 DIAGNOSIS — R5381 Other malaise: Secondary | ICD-10-CM | POA: Insufficient documentation

## 2012-11-24 DIAGNOSIS — Z885 Allergy status to narcotic agent status: Secondary | ICD-10-CM | POA: Insufficient documentation

## 2012-11-24 DIAGNOSIS — F17200 Nicotine dependence, unspecified, uncomplicated: Secondary | ICD-10-CM | POA: Insufficient documentation

## 2012-11-24 DIAGNOSIS — Z3202 Encounter for pregnancy test, result negative: Secondary | ICD-10-CM | POA: Insufficient documentation

## 2012-11-24 DIAGNOSIS — F329 Major depressive disorder, single episode, unspecified: Secondary | ICD-10-CM | POA: Insufficient documentation

## 2012-11-24 DIAGNOSIS — R6883 Chills (without fever): Secondary | ICD-10-CM | POA: Insufficient documentation

## 2012-11-24 DIAGNOSIS — IMO0001 Reserved for inherently not codable concepts without codable children: Secondary | ICD-10-CM | POA: Insufficient documentation

## 2012-11-24 DIAGNOSIS — R197 Diarrhea, unspecified: Secondary | ICD-10-CM | POA: Insufficient documentation

## 2012-11-24 DIAGNOSIS — Z8742 Personal history of other diseases of the female genital tract: Secondary | ICD-10-CM | POA: Insufficient documentation

## 2012-11-24 DIAGNOSIS — R300 Dysuria: Secondary | ICD-10-CM | POA: Insufficient documentation

## 2012-11-24 LAB — URINALYSIS, ROUTINE W REFLEX MICROSCOPIC
Bilirubin Urine: NEGATIVE
Hgb urine dipstick: NEGATIVE
Protein, ur: NEGATIVE mg/dL
Urobilinogen, UA: 0.2 mg/dL (ref 0.0–1.0)

## 2012-11-24 MED ORDER — PREDNISONE 20 MG PO TABS
60.0000 mg | ORAL_TABLET | Freq: Once | ORAL | Status: AC
  Administered 2012-11-24: 60 mg via ORAL
  Filled 2012-11-24: qty 3

## 2012-11-24 MED ORDER — ALBUTEROL SULFATE HFA 108 (90 BASE) MCG/ACT IN AERS
2.0000 | INHALATION_SPRAY | Freq: Once | RESPIRATORY_TRACT | Status: AC
  Administered 2012-11-24: 2 via RESPIRATORY_TRACT
  Filled 2012-11-24: qty 6.7

## 2012-11-24 MED ORDER — PREDNISONE 10 MG PO TABS: ORAL_TABLET | ORAL | Status: DC

## 2012-11-24 MED ORDER — FEXOFENADINE HCL 60 MG PO TABS: 60.0000 mg | ORAL_TABLET | Freq: Two times a day (BID) | ORAL | Status: DC

## 2012-11-24 NOTE — ED Provider Notes (Signed)
  Medical screening examination/treatment/procedure(s) were performed by non-physician practitioner and as supervising physician I was immediately available for consultation/collaboration.    Tyrus Wilms, MD 11/24/12 2025 

## 2012-11-24 NOTE — ED Provider Notes (Signed)
CSN: [id number]     Arrival date & time 11/24/12  1434 History   First MD Initiated Contact with Patient 11/24/12 1643     Chief Complaint  Patient presents with  . URI  . Dysuria   (Consider location/radiation/quality/duration/timing/severity/associated sxs/prior Treatment) HPI Mackenzie Gibson is a 27 y.o. female who presents emergency department with complaint of nasal congestion, sore throat, cough, body aches, chills. Patient states symptoms began 3 weeks ago. States that everyone in her household including her roommate and the boyfriend had same symptoms but states they all improved, states she is not getting any better. Patient denies any fevers. States she did have some chills. States she's taking over-the-counter cold medications with no relief. States seems like her congestion is improving but she is feeling like her head is full. States feels like her ears are clogged up. Patient reports she is a smoker and states everyone in her household smokes. Patient denies any neck pain or stiffness. Patient denies any rash. Patient denies any nausea vomiting, states she has had some loose stools. He should also mention that her urine looks cloudy however she denies any pain with urination, urinary frequency or urgency, denies any flank pain.  Past Medical History  Diagnosis Date  . PCOS (polycystic ovarian syndrome)   . Depression    Past Surgical History  Procedure Laterality Date  . Cholecystectomy    . Eye surgery     Family History  Problem Relation Age of Onset  . Depression Mother   . Depression Father    History  Substance Use Topics  . Smoking status: Current Every Day Smoker -- 0.30 packs/day    Types: Cigarettes  . Smokeless tobacco: Not on file  . Alcohol Use: No   OB History   Grav Para Term Preterm Abortions TAB SAB Ect Mult Living   2 2 2  0 0 0 0 0 0 2     Review of Systems  Constitutional: Positive for chills and fatigue. Negative for fever.  HENT: Positive  for congestion, sore throat and sinus pressure. Negative for neck pain and neck stiffness.   Respiratory: Positive for cough. Negative for chest tightness and shortness of breath.   Cardiovascular: Negative for chest pain, palpitations and leg swelling.  Gastrointestinal: Positive for diarrhea. Negative for nausea, vomiting and abdominal pain.  Genitourinary: Negative for dysuria, flank pain, vaginal bleeding, vaginal discharge, vaginal pain and pelvic pain.  Musculoskeletal: Negative for myalgias and arthralgias.  Skin: Negative for rash.  Neurological: Negative for dizziness, weakness and headaches.  All other systems reviewed and are negative.    Allergies  Hydrocodone  Home Medications  No current outpatient prescriptions on file. BP 117/98  Pulse 73  Temp(Src) 98.2 F (36.8 C) (Oral)  Resp 16  SpO2 98%  LMP 11/07/2012 Physical Exam  Nursing note and vitals reviewed. Constitutional: She is oriented to person, place, and time. She appears well-developed and well-nourished. No distress.  HENT:  Head: Normocephalic and atraumatic.  Right Ear: Tympanic membrane, external ear and ear canal normal.  Left Ear: Tympanic membrane, external ear and ear canal normal.  Nose: Rhinorrhea present. Right sinus exhibits no maxillary sinus tenderness and no frontal sinus tenderness. Left sinus exhibits no maxillary sinus tenderness and no frontal sinus tenderness.  Mouth/Throat: Uvula is midline, oropharynx is clear and moist and mucous membranes are normal.  Postnasal drainage  Eyes: Conjunctivae are normal.  Neck: Normal range of motion. Neck supple.  No meningismus  Cardiovascular: Normal rate,  regular rhythm and normal heart sounds.   Pulmonary/Chest: Effort normal. No respiratory distress. She has wheezes. She has no rales.   Mild and expiratory wheezes bilaterally  Abdominal: Soft. Bowel sounds are normal. She exhibits no distension. There is no tenderness. There is no rebound.   Musculoskeletal: She exhibits no edema.  Neurological: She is alert and oriented to person, place, and time.  Skin: Skin is warm and dry.  Psychiatric: She has a normal mood and affect. Her behavior is normal.    ED Course  Procedures (including critical care time) Labs Review Labs Reviewed  URINALYSIS, ROUTINE W REFLEX MICROSCOPIC - Abnormal; Notable for the following:    Leukocytes, UA TRACE (*)    All other components within normal limits  URINE MICROSCOPIC-ADD ON  POCT PREGNANCY, URINE   Imaging Review Dg Chest 2 View  11/24/2012   CLINICAL DATA:  Congestion for 3 weeks. Coughing.  EXAM: CHEST  2 VIEW  COMPARISON:  02/15/2009.  FINDINGS: No infiltrate, congestive heart failure or pneumothorax.  Heart size within normal limits  IMPRESSION: No acute abnormality.   Electronically Signed   By: Bridgett Larsson   On: 11/24/2012 15:27    MDM   1. Bronchitis   2. URI (upper respiratory infection)     Patient with upper respiratory symptoms for 3 weeks. She is afebrile emergency department. She does not have any neck pain or stiffness.the exam she does have nasal congestion, postnasal drainage, she also has end expiratory wheezes on her lung exam.she is a smoker. her vital signs are normal. Suspect prolonged cough to bronchitis. Discussed cessation of smoking. Will start on albuterol inhaler and prednisone taper. She also reported cloudy urine. On exam, appears normal. No signs of infection. Pt denied any vaginal symptoms. At this time, no further evaluation is indicated. Home with pcp follow up.   Filed Vitals:   11/24/12 1441 11/24/12 1730  BP: 139/90 117/98  Pulse: 81 73  Temp: 98.2 F (36.8 C)   TempSrc: Oral   Resp: 18 16  SpO2: 100% 98%      Lottie Mussel, PA-C 11/24/12 1954

## 2012-11-24 NOTE — ED Notes (Signed)
Patient brought to room 41 at 1725.

## 2012-11-24 NOTE — ED Notes (Signed)
Pt c/o URI sx with body aches and chills; pt sts dysuria today

## 2013-03-24 ENCOUNTER — Emergency Department (HOSPITAL_COMMUNITY): Payer: Self-pay

## 2013-03-24 ENCOUNTER — Emergency Department (HOSPITAL_COMMUNITY)
Admission: EM | Admit: 2013-03-24 | Discharge: 2013-03-24 | Disposition: A | Discharge status: Home / Self Care | Payer: Self-pay | Attending: Emergency Medicine | Admitting: Emergency Medicine

## 2013-03-24 ENCOUNTER — Encounter (HOSPITAL_COMMUNITY): Payer: Self-pay | Admitting: Emergency Medicine

## 2013-03-24 DIAGNOSIS — J40 Bronchitis, not specified as acute or chronic: Secondary | ICD-10-CM | POA: Insufficient documentation

## 2013-03-24 DIAGNOSIS — Z8742 Personal history of other diseases of the female genital tract: Secondary | ICD-10-CM | POA: Insufficient documentation

## 2013-03-24 DIAGNOSIS — Z8659 Personal history of other mental and behavioral disorders: Secondary | ICD-10-CM | POA: Insufficient documentation

## 2013-03-24 DIAGNOSIS — J209 Acute bronchitis, unspecified: Secondary | ICD-10-CM

## 2013-03-24 DIAGNOSIS — F17200 Nicotine dependence, unspecified, uncomplicated: Secondary | ICD-10-CM | POA: Insufficient documentation

## 2013-03-24 LAB — POCT I-STAT TROPONIN I: TROPONIN I, POC: 0 ng/mL (ref 0.00–0.08)

## 2013-03-24 LAB — BASIC METABOLIC PANEL
BUN: 11 mg/dL (ref 6–23)
CALCIUM: 9.1 mg/dL (ref 8.4–10.5)
CHLORIDE: 101 meq/L (ref 96–112)
CO2: 25 mEq/L (ref 19–32)
CREATININE: 0.72 mg/dL (ref 0.50–1.10)
GFR calc non Af Amer: >90 mL/min (ref >90)
Glucose, Bld: 94 mg/dL (ref 70–99)
Potassium: 4.1 mEq/L (ref 3.7–5.3)
Sodium: 139 mEq/L (ref 137–147)

## 2013-03-24 LAB — CBC
HEMATOCRIT: 42.6 % (ref 36.0–46.0)
HEMOGLOBIN: 14.9 g/dL (ref 12.0–15.0)
MCH: 29.6 pg (ref 26.0–34.0)
MCHC: 35 g/dL (ref 30.0–36.0)
MCV: 84.7 fL (ref 78.0–100.0)
Platelets: 206 10*3/uL (ref 150–400)
RBC: 5.03 MIL/uL (ref 3.87–5.11)
RDW: 12.9 % (ref 11.5–15.5)
WBC: 8.9 10*3/uL (ref 4.0–10.5)

## 2013-03-24 MED ORDER — ALBUTEROL SULFATE HFA 108 (90 BASE) MCG/ACT IN AERS
1.0000 | INHALATION_SPRAY | RESPIRATORY_TRACT | Status: DC | PRN
  Administered 2013-03-24: 2 via RESPIRATORY_TRACT
  Filled 2013-03-24: qty 6.7

## 2013-03-24 MED ORDER — BENZONATATE 100 MG PO CAPS: 100.0000 mg | ORAL_CAPSULE | Freq: Three times a day (TID) | ORAL | Status: DC

## 2013-03-24 MED ORDER — PREDNISONE 50 MG PO TABS: 50.0000 mg | ORAL_TABLET | Freq: Every day | ORAL | Status: DC

## 2013-03-24 MED ORDER — NAPROXEN 250 MG PO TABS: 500.0000 mg | ORAL_TABLET | Freq: Two times a day (BID) | ORAL | Status: DC

## 2013-03-24 MED ORDER — NAPROXEN 250 MG PO TABS
500.0000 mg | ORAL_TABLET | Freq: Once | ORAL | Status: AC
  Administered 2013-03-24: 500 mg via ORAL
  Filled 2013-03-24: qty 2

## 2013-03-24 MED ORDER — NAPROXEN 500 MG PO TABS: 500.0000 mg | ORAL_TABLET | Freq: Two times a day (BID) | ORAL | Status: DC

## 2013-03-24 NOTE — Discharge Instructions (Signed)
Bronchitis °Bronchitis is inflammation of the airways that extend from the windpipe into the lungs (bronchi). The inflammation often causes mucus to develop, which leads to a cough. If the inflammation becomes severe, it may cause shortness of breath. °CAUSES  °Bronchitis may be caused by:  °· Viral infections.   °· Bacteria.   °· Cigarette smoke.   °· Allergens, pollutants, and other irritants.   °SIGNS AND SYMPTOMS  °The most common symptom of bronchitis is a frequent cough that produces mucus. Other symptoms include: °· Fever.   °· Body aches.   °· Chest congestion.   °· Chills.   °· Shortness of breath.   °· Sore throat.   °DIAGNOSIS  °Bronchitis is usually diagnosed through a medical history and physical exam. Tests, such as chest X-rays, are sometimes done to rule out other conditions.  °TREATMENT  °You may need to avoid contact with whatever caused the problem (smoking, for example). Medicines are sometimes needed. These may include: °· Antibiotics. These may be prescribed if the condition is caused by bacteria. °· Cough suppressants. These may be prescribed for relief of cough symptoms.   °· Inhaled medicines. These may be prescribed to help open your airways and make it easier for you to breathe.   °· Steroid medicines. These may be prescribed for those with recurrent (chronic) bronchitis. °HOME CARE INSTRUCTIONS °· Get plenty of rest.   °· Drink enough fluids to keep your urine clear or pale yellow (unless you have a medical condition that requires fluid restriction). Increasing fluids may help thin your secretions and will prevent dehydration.   °· Only take over-the-counter or prescription medicines as directed by your health care provider. °· Only take antibiotics as directed. Make sure you finish them even if you start to feel better. °· Avoid secondhand smoke, irritating chemicals, and strong fumes. These will make bronchitis worse. If you are a smoker, quit smoking. Consider using nicotine gum or  skin patches to help control withdrawal symptoms. Quitting smoking will help your lungs heal faster.   °· Put a cool-mist humidifier in your bedroom at night to moisten the air. This may help loosen mucus. Change the water in the humidifier daily. You can also run the hot water in your shower and sit in the bathroom with the door closed for 5 10 minutes.   °· Follow up with your health care provider as directed.   °· Wash your hands frequently to avoid catching bronchitis again or spreading an infection to others.   °SEEK MEDICAL CARE IF: °Your symptoms do not improve after 1 week of treatment.  °SEEK IMMEDIATE MEDICAL CARE IF: °· Your fever increases. °· You have chills.   °· You have chest pain.   °· You have worsening shortness of breath.   °· You have bloody sputum. °· You faint.   °· You have lightheadedness. °· You have a severe headache.   °· You vomit repeatedly. °MAKE SURE YOU:  °· Understand these instructions. °· Will watch your condition. °· Will get help right away if you are not doing well or get worse. °Document Released: 02/18/2005 Document Revised: 12/09/2012 Document Reviewed: 10/13/2012 °ExitCare® Patient Information ©2014 ExitCare, LLC. ° °Upper Respiratory Infection, Adult °An upper respiratory infection (URI) is also sometimes known as the common cold. The upper respiratory tract includes the nose, sinuses, throat, trachea, and bronchi. Bronchi are the airways leading to the lungs. Most people improve within 1 week, but symptoms can last up to 2 weeks. A residual cough may last even longer.  °CAUSES °Many different viruses can infect the tissues lining the upper respiratory tract. The tissues become irritated   and inflamed and often become very moist. Mucus production is also common. A cold is contagious. You can easily spread the virus to others by oral contact. This includes kissing, sharing a glass, coughing, or sneezing. Touching your mouth or nose and then touching a surface, which is then  touched by another person, can also spread the virus. SYMPTOMS  Symptoms typically develop 1 to 3 days after you come in contact with a cold virus. Symptoms vary from person to person. They may include:  Runny nose.  Sneezing.  Nasal congestion.  Sinus irritation.  Sore throat.  Loss of voice (laryngitis).  Cough.  Fatigue.  Muscle aches.  Loss of appetite.  Headache.  Low-grade fever. DIAGNOSIS  You might diagnose your own cold based on familiar symptoms, since most people get a cold 2 to 3 times a year. Your caregiver can confirm this based on your exam. Most importantly, your caregiver can check that your symptoms are not due to another disease such as strep throat, sinusitis, pneumonia, asthma, or epiglottitis. Blood tests, throat tests, and X-rays are not necessary to diagnose a common cold, but they may sometimes be helpful in excluding other more serious diseases. Your caregiver will decide if any further tests are required. RISKS AND COMPLICATIONS  You may be at risk for a more severe case of the common cold if you smoke cigarettes, have chronic heart disease (such as heart failure) or lung disease (such as asthma), or if you have a weakened immune system. The very young and very old are also at risk for more serious infections. Bacterial sinusitis, middle ear infections, and bacterial pneumonia can complicate the common cold. The common cold can worsen asthma and chronic obstructive pulmonary disease (COPD). Sometimes, these complications can require emergency medical care and may be life-threatening. PREVENTION  The best way to protect against getting a cold is to practice good hygiene. Avoid oral or hand contact with people with cold symptoms. Wash your hands often if contact occurs. There is no clear evidence that vitamin C, vitamin E, echinacea, or exercise reduces the chance of developing a cold. However, it is always recommended to get plenty of rest and practice good  nutrition. TREATMENT  Treatment is directed at relieving symptoms. There is no cure. Antibiotics are not effective, because the infection is caused by a virus, not by bacteria. Treatment may include:  Increased fluid intake. Sports drinks offer valuable electrolytes, sugars, and fluids.  Breathing heated mist or steam (vaporizer or shower).  Eating chicken soup or other clear broths, and maintaining good nutrition.  Getting plenty of rest.  Using gargles or lozenges for comfort.  Controlling fevers with ibuprofen or acetaminophen as directed by your caregiver.  Increasing usage of your inhaler if you have asthma. Zinc gel and zinc lozenges, taken in the first 24 hours of the common cold, can shorten the duration and lessen the severity of symptoms. Pain medicines may help with fever, muscle aches, and throat pain. A variety of non-prescription medicines are available to treat congestion and runny nose. Your caregiver can make recommendations and may suggest nasal or lung inhalers for other symptoms.  HOME CARE INSTRUCTIONS   Only take over-the-counter or prescription medicines for pain, discomfort, or fever as directed by your caregiver.  Use a warm mist humidifier or inhale steam from a shower to increase air moisture. This may keep secretions moist and make it easier to breathe.  Drink enough water and fluids to keep your urine clear or pale  yellow.  Rest as needed.  Return to work when your temperature has returned to normal or as your caregiver advises. You may need to stay home longer to avoid infecting others. You can also use a face mask and careful hand washing to prevent spread of the virus. SEEK MEDICAL CARE IF:   After the first few days, you feel you are getting worse rather than better.  You need your caregiver's advice about medicines to control symptoms.  You develop chills, worsening shortness of breath, or brown or red sputum. These may be signs of  pneumonia.  You develop yellow or brown nasal discharge or pain in the face, especially when you bend forward. These may be signs of sinusitis.  You develop a fever, swollen neck glands, pain with swallowing, or white areas in the back of your throat. These may be signs of strep throat. SEEK IMMEDIATE MEDICAL CARE IF:   You have a fever.  You develop severe or persistent headache, ear pain, sinus pain, or chest pain.  You develop wheezing, a prolonged cough, cough up blood, or have a change in your usual mucus (if you have chronic lung disease).  You develop sore muscles or a stiff neck. Document Released: 08/14/2000 Document Revised: 05/13/2011 Document Reviewed: 06/22/2010 [facility] Patient Information 2014 [place]Care, [place].   Emergency Department Resource Guide 1) Find a Doctor and Pay Out of Pocket Although you won't have to find out who is covered by your insurance plan, it is a good idea to ask around and get recommendations. You will then need to call the office and see if the doctor you have chosen will accept you as a new patient and what types of options they offer for patients who are self-pay. Some doctors offer discounts or will set up payment plans for their patients who do not have insurance, but you will need to ask so you aren't surprised when you get to your appointment.  2) Contact Your Local Health Department Not all health departments have doctors that can see patients for sick visits, but many do, so it is worth a call to see if yours does. If you don't know where your local health department is, you can check in your phone book. The CDC also has a tool to help you locate your state's health department, and many state websites also have listings of all of their local health departments.  3) Find a Walk-in Clinic If your illness is not likely to be very severe or complicated, you may want to try a walk in clinic. These are popping up all over the country in pharmacies,  drugstores, and shopping centers. They're usually staffed by nurse practitioners or physician assistants that have been trained to treat common illnesses and complaints. They're usually fairly quick and inexpensive. However, if you have serious medical issues or chronic medical problems, these are probably not your best option.  No Primary Care Doctor: - Call Health Connect at  [phone] - they can help you locate a primary care doctor that  accepts your insurance, provides certain services, etc. - Physician Referral Service- [phone]  Chronic Pain Problems: Organization         Address  Phone   Notes  [facility]  [phone]) [phone] Patients need to be referred by their primary care doctor.   Medication Assistance: Organization         Address  Phone   Notes  [facility] [address]  [place], [place] [address] [phone] --Must be a resident of [facility] -- Must have NO insurance coverage whatsoever (no Medicaid/ Medicare, etc.) -- The pt. MUST have a primary care doctor that directs their care regularly and follows them in the community   [facility]  [phone]   [employer]  [phone]    Agencies that provide inexpensive medical care: Organization         Address  Phone   Notes  [place]  [phone]   [facility]    [phone]   [facility] [place], [place] [address] [phone]   [place] [address], [place] [phone]   Planned Parenthood    [phone]   [facility]    [phone]   [place] and [address], [place] Phone:  [phone], Fax:  [phone] Hours of Operation:  9 am - 6 pm, M-F.  Also accepts Medicaid/Medicare and self-pay.  [facility] [place], [address], [place] Phone:  [phone], Fax: [phone]. Hours of Operation:  8:30 am - 5:30 pm, M-F.  Also accepts Medicaid and self-pay.  [facility] [address], [place] Phone: [phone]   [place], [place], [place] [phone], Ext. [phone] Mondays & Thursdays: 7-9 AM.  First 15 patients are seen on a first come, first serve basis.    [place] Providers:  Organization         Address  Phone   Notes  [facility] [address], [place] [phone] Also accepts self-pay patients.  [facility] [address], [place]  [phone]   [place], [address], [place] [phone]   [facility] [address] [place] [phone]   Lucianne Lei [address], [place]   [phone] Only accepts [place] patients after they have their name applied to their card.   Self-Pay (no insurance) in [facility]:  Organization         Address  Phone   Notes  Sickle Cell Patients, [facility] [place] [phone]   [facility] [place] [phone]   [facility] [place]  [place], [place], [place] [phone]   [facility]/Dr. Osei-Bonsu  [address] [place] or [address], [place] [phone] Phone number for both [place] and [place] locations is the same.  [facility] [address], [place] [phone]   [facility] [address] [place] or [address] [phone]   [facility] [address] [place] [phone], phone; [phone], fax Sees patients 1st and 3rd Saturday of every month.  Must not qualify for public  or private insurance (i.e. Medicaid, Medicare, [place] Health Choice, Veterans' Benefits)  Household income should be no more than 200% of the poverty level The clinic cannot treat you if you are pregnant or think you are pregnant  Sexually transmitted diseases are not treated at the clinic.   Dental Care:  Organization         Address  Phone  Notes  [facility] Department of [facility] [address] [place], [place] [phone] Accepts children up to age 77 who are enrolled in [place] or [place] Health Choice; pregnant women with a Medicaid card; and children who have applied for Medicaid or [place] Health Choice, but were declined, whose parents can pay a reduced fee at time of service.  [facility]  [address], [place] [phone] Accepts children up to age 59 who are enrolled in [place] or [place] Health Choice; pregnant women with a Medicaid card; and children who have applied for Medicaid or [place] Health Choice, but were declined, whose parents can pay a reduced fee at time of service.  [facility]  [address] [place], [place] [phone] Patients are seen by appointment only. Walk-ins are not accepted. [facility] will see patients 32 years of age and older. Monday - Tuesday (8am-5pm) Most Wednesdays (8:30-5pm) $30 per visit, cash only  [facility]  [address] [place] [address], [facility] [phone] Patients are seen by appointment only. Walk-ins are not accepted. [facility] will see patients 48 years of age and older. One Wednesday Evening (Monthly: Volunteer Based).  $30 per visit, cash only  [employer]  [phone] for adults; Children under age 18, call Graduate Pediatric Dentistry at [phone]. Children aged 61-14, please call [phone] to request a pediatric application.  Dental services are provided in all areas of dental  care including fillings, crowns and bridges, complete and partial dentures, implants, gum treatment, root canals, and extractions. Preventive care is also provided. Treatment is provided to both adults and children. Patients are selected via a lottery and there is often a waiting list.   [facility] [address] [place]  [phone] [email or web address]   [facility] [address] [place], [place] [phone], Ext. [phone] Second and Fourth Thursday of each month, opens at 6:30 AM; Clinic ends at 9 AM.  Patients are seen on a first-come first-served basis, and a limited number are seen during each clinic.   [facility]  [address] Ether Griffins [place], [place] [phone]   Eligibility Requirements You must have lived in [place], [place], or [place] counties for at least the last three months.   You cannot be eligible for state or federal sponsored [place], including [employer], [place], or [employer].   You generally cannot be eligible for healthcare insurance through your employer.    How to apply: Eligibility screenings are held every Tuesday and Wednesday afternoon from 1:00 pm until 4:00 pm. You do not need an appointment for the interview!  [facility] [address], [place], [place] [phone]   [facility]  [phone]   [facility]  [phone]   [facility] Health Department  [phone]    Behavioral Health Resources in the Community: Intensive Outpatient Programs Organization         Address  Phone  Notes  [facility] Services [address] [place], [place] [phone]   [facility] Outpatient [address], [place], [place] [phone]   [facility] [address], [place], [place]  [phone]   [facility] [address],  [place], [place]  [phone] or [phone]    Substance Abuse Resources Organization         Address  Phone  Notes  [facility]  [phone]   [facility]  [phone]   [facility] [place]  [phone]   Floydene Flock  [phone]   Residential & Outpatient Substance Abuse Program  [phone]   Psychological Services Organization         Address  Phone  Notes  [facility]  [phone]   [facility] Services  [phone]   [facility] [address] [place] [phone] or [phone]    Mobile Crisis Teams Organization         Address  Phone  Notes  [facility], Mobile Crisis Care Unit  [phone]   [facility]  [address] [place], [place] [phone]   Doristine Locks [address] [place] [phone]    Self-Help/Support Groups Organization         Address  Phone             Notes  [facility] [place] - variety of support groups  336- I7437963 Call for more information  Narcotics Anonymous (NA), [facility] [address], [employer] [place]  2 meetings at this location   Statistician         Address  Phone  Notes  [facility] [address],    [place]  [phone]   [facility]  [address], [place] [address], [place], [place] [phone]   [facility] [address] [place], [place] [phone] Admissions: 8am-3pm M-F  [facility] [address]    [place], [place] [phone]   [facility] [address] [place], [place], [place] [phone]   [facility] [address],  [place], [place] [phone]   [facility] - Intensive Outpatient [address]urell [address], [place], [place] [phone]   [facility] ([facility] [address] [place]  [place], [place] [phone] or [phone]   [facility]  Services (RTS) [address], [place], [place] [phone] Accepts Medicaid  [facility] [place] [address]  [place] [phone] Substance Abuse/Addiction Treatment   [facility] Organization         Address  Phone  Notes  [facility]  [phone]   Angie Fava, PhD [address] Ervin Knack [place], [place]   [phone] or [phone]   [facility] Behavioral   [address] [place], [place] [phone]   [facility] [address] [place], [place] [phone] Insurance/Medicaid/sponsorship through [facility] and Families [address]                                    [place], [place] [phone] Therapy/tele-psych/case  [facility] [address], [place] [phone]    Dr. Lolly Mustache  [phone]   [facility] [place]  [employer] [facility] Dept. 1) [address], [place] 2) [address] [place] [address], [place] 3)  [address], [place] [phone]  [phone]   [facility] Child Abuse Hotline 7016310395 or 4780882576 (After Hours)

## 2013-03-24 NOTE — ED Provider Notes (Signed)
CSN: [id number]     Arrival date & time 03/24/13  1112 History   First MD Initiated Contact with Patient 03/24/13 1323     Chief Complaint  Patient presents with  . Nasal Congestion  . Chest Pain  . Cough    Patient is a 28 y.o. female presenting with chest pain and cough. The history is provided by the patient.  Chest Pain Pain location:  R chest and L chest Pain quality: sharp   Pain radiates to:  Does not radiate Pain radiates to the back: no   Pain severity:  Moderate Onset quality:  Gradual Duration:  3 weeks Timing:  Intermittent Context: eating   Context comment:  It is associated wtih a cough Associated symptoms: cough   Cough Cough characteristics:  Dry Severity:  Moderate Onset quality:  Gradual Duration:  3 weeks Timing:  Constant Relieved by:  Nothing Worsened by:  Nothing tried Ineffective treatments:  None tried Associated symptoms: chest pain     Past Medical History  Diagnosis Date  . PCOS (polycystic ovarian syndrome)   . Depression    Past Surgical History  Procedure Laterality Date  . Cholecystectomy    . Eye surgery     Family History  Problem Relation Age of Onset  . Depression Mother   . Depression Father    History  Substance Use Topics  . Smoking status: Current Every Day Smoker -- 0.30 packs/day    Types: Cigarettes  . Smokeless tobacco: Not on file  . Alcohol Use: No   OB History   Grav Para Term Preterm Abortions TAB SAB Ect Mult Living   2 2 2  0 0 0 0 0 0 2     Review of Systems  Respiratory: Positive for cough.   Cardiovascular: Positive for chest pain.  All other systems reviewed and are negative.    Allergies  Hydrocodone  Home Medications   Current Outpatient Rx  Name  Route  Sig  Dispense  Refill  . acetaminophen (TYLENOL) 325 MG tablet   Oral   Take 650 mg by mouth every 6 (six) hours as needed for headache.         . albuterol (PROVENTIL HFA;VENTOLIN HFA) 108 (90 BASE) MCG/ACT inhaler   Inhalation  Inhale 2 puffs into the lungs 2 (two) times daily as needed for wheezing or shortness of breath.         Marland Kitchen ibuprofen (ADVIL,MOTRIN) 200 MG tablet   Oral   Take 400 mg by mouth every 6 (six) hours as needed for headache.         . pseudoephedrine (SUDAFED) 30 MG tablet   Oral   Take 30 mg by mouth every 4 (four) hours as needed for congestion.         . benzonatate (TESSALON) 100 MG capsule   Oral   Take 1 capsule (100 mg total) by mouth every 8 (eight) hours.   21 capsule   0   . naproxen (NAPROSYN) 500 MG tablet   Oral   Take 1 tablet (500 mg total) by mouth 2 (two) times daily with a meal.   14 tablet   0   . predniSONE (DELTASONE) 50 MG tablet   Oral   Take 1 tablet (50 mg total) by mouth daily.   5 tablet   0     Dispense as written.    BP 112/74  Pulse 81  Temp(Src) 98.2 F (36.8 C) (Oral)  Resp 28  Ht 5\' 7"  (1.702 m)  Wt 250 lb (113.399 kg)  BMI 39.15 kg/m2  SpO2 100%  LMP 03/10/2013 Physical Exam  Nursing note and vitals reviewed. Constitutional: She appears well-developed and well-nourished. No distress.  HENT:  Head: Normocephalic and atraumatic.  Right Ear: External ear normal.  Left Ear: External ear normal.  Eyes: Conjunctivae are normal. Right eye exhibits no discharge. Left eye exhibits no discharge. No scleral icterus.  Neck: Neck supple. No tracheal deviation present.  Cardiovascular: Normal rate, regular rhythm and intact distal pulses.   Pulmonary/Chest: Effort normal. No stridor. No respiratory distress. She has wheezes (very slight at end epxiration). She has no rales.  Abdominal: Soft. Bowel sounds are normal. She exhibits no distension. There is no tenderness. There is no rebound and no guarding.  Musculoskeletal: She exhibits no edema and no tenderness.  Neurological: She is alert. She has normal strength. No cranial nerve deficit (no facial droop, extraocular movements intact, no slurred speech) or sensory deficit. She exhibits  normal muscle tone. She displays no seizure activity. Coordination normal.  Skin: Skin is warm and dry. No rash noted.  Psychiatric: She has a normal mood and affect.    ED Course  Procedures (including critical care time) Labs Review Labs Reviewed  BASIC METABOLIC PANEL  CBC  POCT I-STAT TROPONIN I   Imaging Review Dg Chest 2 View (if Patient Has Fever And/or Copd)  03/24/2013   CLINICAL DATA:  Cough, congestion, sinus pressure  EXAM: CHEST  2 VIEW  COMPARISON:  11/24/2012  FINDINGS: Cardiomediastinal silhouette is stable. No acute infiltrate or pleural effusion. No pulmonary edema. Bony thorax is unremarkable.  IMPRESSION: No active cardiopulmonary disease.   Electronically Signed   By: Lahoma Crocker M.D.   On: 03/24/2013 12:17    EKG Interpretation    Date/Time:  Wednesday March 24 2013 11:20:06 EST Ventricular Rate:  83 PR Interval:  118 QRS Duration: 80 QT Interval:  378 QTC Calculation: 444 R Axis:   54 Text Interpretation:  Normal sinus rhythm Normal ECG No significant change since last tracing Confirmed by Seraphine Gudiel  MD-J, Jema Deegan ([id number]) on 03/24/2013 1:46:43 PM           Medications  albuterol (PROVENTIL HFA;VENTOLIN HFA) 108 (90 BASE) MCG/ACT inhaler 1-2 puff (2 puffs Inhalation Given 03/24/13 1355)  naproxen (NAPROSYN) tablet 500 mg (500 mg Oral Given 03/24/13 1355)    MDM   1. Bronchitis with bronchospasm    Counseled pt on smoking cessation.  Suspect her symptoms are related to bronchospasm.  No pna.  Antibiotics not indicated at this time.  DC home on steroids and albuterol hfa.  Tessalon prn    Kathalene Frames, MD 03/24/13 1400

## 2013-03-24 NOTE — ED Notes (Signed)
Pt reports cough, congestion, sinus pressure and chest pain, pt sts sons have had bronchitis and she was exposed to it.

## 2013-04-27 ENCOUNTER — Ambulatory Visit: Payer: Medicaid Other | Admitting: Family Medicine

## 2013-05-27 ENCOUNTER — Encounter: Payer: Self-pay | Admitting: Family Medicine

## 2013-05-27 ENCOUNTER — Ambulatory Visit (INDEPENDENT_AMBULATORY_CARE_PROVIDER_SITE_OTHER): Payer: Medicaid Other | Admitting: Family Medicine

## 2013-05-27 VITALS — BP 129/78 | HR 91 | Temp 99.2°F | Ht 67.0 in | Wt 257.0 lb

## 2013-05-27 DIAGNOSIS — E282 Polycystic ovarian syndrome: Secondary | ICD-10-CM

## 2013-05-27 DIAGNOSIS — E669 Obesity, unspecified: Secondary | ICD-10-CM

## 2013-05-27 DIAGNOSIS — R030 Elevated blood-pressure reading, without diagnosis of hypertension: Secondary | ICD-10-CM

## 2013-05-27 DIAGNOSIS — Z0000 Encounter for general adult medical examination without abnormal findings: Secondary | ICD-10-CM | POA: Insufficient documentation

## 2013-05-27 DIAGNOSIS — Z309 Encounter for contraceptive management, unspecified: Secondary | ICD-10-CM

## 2013-05-27 LAB — CBC
HCT: 41.9 % (ref 36.0–46.0)
Hemoglobin: 14.4 g/dL (ref 12.0–15.0)
MCH: 28.6 pg (ref 26.0–34.0)
MCHC: 34.4 g/dL (ref 30.0–36.0)
MCV: 83.1 fL (ref 78.0–100.0)
Platelets: 210 10*3/uL (ref 150–400)
RBC: 5.04 MIL/uL (ref 3.87–5.11)
RDW: 14.5 % (ref 11.5–15.5)
WBC: 10.1 10*3/uL (ref 4.0–10.5)

## 2013-05-27 LAB — POCT GLYCOSYLATED HEMOGLOBIN (HGB A1C): HEMOGLOBIN A1C: 4.6

## 2013-05-27 LAB — BASIC METABOLIC PANEL
BUN: 10 mg/dL (ref 6–23)
CALCIUM: 9.2 mg/dL (ref 8.4–10.5)
CO2: 28 meq/L (ref 19–32)
CREATININE: 0.75 mg/dL (ref 0.50–1.10)
Chloride: 106 mEq/L (ref 96–112)
Glucose, Bld: 83 mg/dL (ref 70–99)
Potassium: 4.3 mEq/L (ref 3.5–5.3)
Sodium: 141 mEq/L (ref 135–145)

## 2013-05-27 LAB — POCT URINE PREGNANCY: Preg Test, Ur: NEGATIVE

## 2013-05-27 MED ORDER — NORGESTIMATE-ETH ESTRADIOL 0.25-35 MG-MCG PO TABS: 1.0000 | ORAL_TABLET | Freq: Every day | ORAL | Status: DC

## 2013-05-27 NOTE — Patient Instructions (Signed)
Polycystic Ovarian Syndrome Polycystic ovarian syndrome (PCOS) is a common hormonal disorder among women of reproductive age. Most women with PCOS grow many small cysts on their ovaries. PCOS can cause problems with your periods and make it difficult to get pregnant. It can also cause an increased risk of miscarriage with pregnancy. If left untreated, PCOS can lead to serious health problems, such as diabetes and heart disease. CAUSES The cause of PCOS is not fully understood, but genetics may be a factor. SIGNS AND SYMPTOMS   Infrequent or no menstrual periods.   Inability to get pregnant (infertility) because of not ovulating.   Increased growth of hair on the face, chest, stomach, back, thumbs, thighs, or toes.   Acne, oily skin, or dandruff.   Pelvic pain.   Weight gain or obesity, usually carrying extra weight around the waist.   Type 2 diabetes.   High cholesterol.   High blood pressure.   Female-pattern baldness or thinning hair.   Patches of thickened and dark brown or black skin on the neck, arms, breasts, or thighs.   Tiny excess flaps of skin (skin tags) in the armpits or neck area.   Excessive snoring and having breathing stop at times while asleep (sleep apnea).   Deepening of the voice.   Gestational diabetes when pregnant.  DIAGNOSIS  There is no single test to diagnose PCOS.   Your health care provider will:   Take a medical history.   Perform a pelvic exam.   Have ultrasonography done.   Check your female and female hormone levels.   Measure glucose or sugar levels in the blood.   Do other blood tests.   If you are producing too many female hormones, your health care provider will make sure it is from PCOS. At the physical exam, your health care provider will want to evaluate the areas of increased hair growth. Try to allow natural hair growth for a few days before the visit.   During a pelvic exam, the ovaries may be enlarged  or swollen because of the increased number of small cysts. This can be seen more easily by using vaginal ultrasonography or screening to examine the ovaries and lining of the uterus (endometrium) for cysts. The uterine lining may become thicker if you have not been having a regular period.  TREATMENT  Because there is no cure for PCOS, it needs to be managed to prevent problems. Treatments are based on your symptoms. Treatment is also based on whether you want to have a baby or whether you need contraception.  Treatment may include:   Progesterone hormone to start a menstrual period.   Birth control pills to make you have regular menstrual periods.   Medicines to make you ovulate, if you want to get pregnant.   Medicines to control your insulin.   Medicine to control your blood pressure.   Medicine and diet to control your high cholesterol and triglycerides in your blood.  Medicine to reduce excessive hair growth.  Surgery, making small holes in the ovary, to decrease the amount of female hormone production. This is done through a long, lighted tube (laparoscope) placed into the pelvis through a tiny incision in the lower abdomen.  HOME CARE INSTRUCTIONS  Only take over-the-counter or prescription medicine as directed by your health care provider.  Pay attention to the foods you eat and your activity levels. This can help reduce the effects of PCOS.  Keep your weight under control.  Eat foods that are   low in carbohydrate and high in fiber.  Exercise regularly. SEEK MEDICAL CARE IF:  Your symptoms do not get better with medicine.  You have new symptoms. Document Released: 06/14/2004 Document Revised: 12/09/2012 Document Reviewed: 08/06/2012 Exit[employer] Patient Information 2014 [employer].  

## 2013-05-27 NOTE — Assessment & Plan Note (Signed)
Patient states she had a history of hypertension and on hypertension medications. However her blood pressure has been normal here. Continue to monitor.

## 2013-05-27 NOTE — Assessment & Plan Note (Signed)
Patient will be due for a Pap. Unsure when her last Pap actually was. She will make an appointment within 4-6 months to have Pap completed. At that time we will do hep C, HIV, RPR, gonorrhea and Chlamydia as well as Pap.

## 2013-05-27 NOTE — Assessment & Plan Note (Addendum)
Decided on Sprintec birth control pills. Patient given prescription for one year. Negative pregnancy today He should return for Pap smear within the next 4-6 months

## 2013-05-27 NOTE — Assessment & Plan Note (Signed)
>>  ASSESSMENT AND PLAN FOR POLYCYSTIC OVARIAN DISEASE WRITTEN ON 05/27/2013  5:05 PM BY KUNEFF, RENEE A, DO  Uncertain if diagnosis is actually correct. Patient is rather histrionic in seems to be mildly paranoid with medical issues. A1c today is 4.2. Baseline labs of CBC, BMP and TSH are ordered. We'll followup on the labs as they become available. Patient seems to be convinced his insulin resistant and will be difficult for her to understand if she does not need metformin.

## 2013-05-27 NOTE — Progress Notes (Signed)
   Subjective:    Patient ID: Mackenzie Gibson, female    DOB: 08-16-1985, 28 y.o.   MRN: [id number]  HPI  PCOS: Patient states she has a history of PCO S, dating back since she was 28 years old. She reports she's been on metformin on off the majority of her life and his evaluation is able to get pregnant. She has concerns that she feels like she is becoming insulin resistant again because she's gained weight and she has noticed a dark ring on the back her neck. She reports her periods have been normal recently. She states she's been having swelling in  her legs and her fingers. She reports she eats a low-salt diet.  Health maintenance: Patient reports it's been since 2012 since she's had her Pap smear. She states it was normal at that time she had her son to her knowledge. She has had a history of abnormal Paps in the past. She states she has a family history of cervical cancer in her sister and mother. She states she's recently noticed dyspareunia and "lots of pain in my ovaries ".  Patient is due for yearly lab work.  Bipolar: Patient carries a diagnosis of bipolar disorder. She states she does not take medication for her bipolar disorder and instead she smokes to control or aggravation. She feels she is in control of her mood.  Contraception management: Patient states she's looking to have a contraception management that will also help her with her PCO S. She is concerned for any weight gain potential. She does not want a Depo-Provera shot.  Tobacco abuse: She reports she cannot stop smoking. She states this is her way to relieve stress and keep her bipolar and under control.    Review of Systems Negative, with the exception of above mentioned in HPI  Objective:   Physical Exam BP 129/78  Pulse 91  Temp(Src) 99.2 F (37.3 C) (Oral)  Ht 5\' 7"  (1.702 m)  Wt 257 lb (116.574 kg)  BMI 40.24 kg/m2  LMP 05/10/2013 Gen: NAD. Anxious. HEENT: AT. [place]. Bilateral TM visualized and normal in  appearance. Bilateral eyes without injections or icterus. MMM. Bilateral nares without erythema. Throat without erythema or exudates.  CV: RRR, no murmurs clicks gallops or rubs Chest: CTAB, no wheeze or crackles Abd: Soft.NTND. BS present. No Masses palpated.  Ext: No erythema. No edema.  Skin: No rashes, purpura or petechiae.  Neuro:  Normal gait. PERLA. EOMi. Alert. Grossly intact.  Psych: Extremely rapid speech. Patient is inappropriate at times, blunt. Rapid and pressured speech. She has hyperactive, appears mildly paranoid with her medical issues.

## 2013-05-27 NOTE — Assessment & Plan Note (Signed)
Uncertain if diagnosis is actually correct. Patient is rather histrionic in seems to be mildly paranoid with medical issues. A1c today is 4.2. Baseline labs of CBC, BMP and TSH are ordered. We'll followup on the labs as they become available. Patient seems to be convinced his insulin resistant and will be difficult for her to understand if she does not need metformin.

## 2013-05-28 LAB — TSH: TSH: 2.438 u[IU]/mL (ref 0.350–4.500)

## 2013-05-29 ENCOUNTER — Telehealth: Payer: Self-pay | Admitting: Family Medicine

## 2013-05-29 DIAGNOSIS — Z202 Contact with and (suspected) exposure to infections with a predominantly sexual mode of transmission: Secondary | ICD-10-CM | POA: Insufficient documentation

## 2013-05-29 NOTE — Telephone Encounter (Signed)
Please call Mackenzie Gibson and inform her all her labs are normal, and I have sent her copy of them via mail. (CBC, BMP, preg, A1c, TSH). I also noticed after shel eft the HIV, RPR and Hepatitis labs were not completed. I gave placed a future order for these labs to be completed at her convenience. She will need to make a lab appointment only for these labs or we can complete them when she has her appointment for her PAP. Thanks.

## 2013-05-31 NOTE — Telephone Encounter (Signed)
Patients informed,states will call back to schedule pap appointment and at that time will have labs done. Mackenzie Gibson, Mackenzie Gibson

## 2013-06-17 ENCOUNTER — Encounter: Payer: Self-pay | Admitting: Family Medicine

## 2013-06-17 ENCOUNTER — Other Ambulatory Visit (HOSPITAL_COMMUNITY)
Admission: RE | Admit: 2013-06-17 | Discharge: 2013-06-17 | Disposition: A | Discharge status: Home / Self Care | Payer: Medicaid Other | Source: Ambulatory Visit | Attending: Family Medicine | Admitting: Family Medicine

## 2013-06-17 ENCOUNTER — Telehealth: Payer: Self-pay | Admitting: Family Medicine

## 2013-06-17 ENCOUNTER — Ambulatory Visit (INDEPENDENT_AMBULATORY_CARE_PROVIDER_SITE_OTHER): Payer: Medicaid Other | Admitting: Family Medicine

## 2013-06-17 VITALS — BP 126/88 | HR 91 | Temp 98.1°F | Ht 67.0 in | Wt 258.0 lb

## 2013-06-17 DIAGNOSIS — Z124 Encounter for screening for malignant neoplasm of cervix: Secondary | ICD-10-CM | POA: Insufficient documentation

## 2013-06-17 DIAGNOSIS — N898 Other specified noninflammatory disorders of vagina: Secondary | ICD-10-CM

## 2013-06-17 DIAGNOSIS — Z202 Contact with and (suspected) exposure to infections with a predominantly sexual mode of transmission: Secondary | ICD-10-CM

## 2013-06-17 DIAGNOSIS — B9689 Other specified bacterial agents as the cause of diseases classified elsewhere: Secondary | ICD-10-CM

## 2013-06-17 DIAGNOSIS — R1032 Left lower quadrant pain: Secondary | ICD-10-CM

## 2013-06-17 DIAGNOSIS — Z113 Encounter for screening for infections with a predominantly sexual mode of transmission: Secondary | ICD-10-CM | POA: Insufficient documentation

## 2013-06-17 DIAGNOSIS — N760 Acute vaginitis: Principal | ICD-10-CM

## 2013-06-17 LAB — POCT WET PREP (WET MOUNT): Clue Cells Wet Prep Whiff POC: NEGATIVE

## 2013-06-17 MED ORDER — METRONIDAZOLE 500 MG PO TABS: 500.0000 mg | ORAL_TABLET | Freq: Three times a day (TID) | ORAL | Status: DC

## 2013-06-17 NOTE — Progress Notes (Signed)
   Subjective:    Patient ID: Mackenzie Gibson, female    DOB: 1985/06/08, 28 y.o.   MRN: [id number]  HPI Routine screening for cervical cancer: Patient returns to office today to have her Pap smear completed. She reports that she has no complications and think she's had some abnormal Paps when she lived out [place]. Her last menstrual period was April 10. She states that her menses are pretty normal. Occurs every 28 days, last for about 5 days. She has concerns that her mother and now her sister has cervical cancer. She reports abdominal pain mostly left-sided and states "my ovaries hurt" for the past few months. She states her paternal grandmother had breast cancer. Patient has started the birth control pills prescribed at her last visit. She states she is doing well on them.  Right wrist: Patient has complaints of right wrist pain on the ulnar aspect of her wrist to her forearm. He states that she has never had any injury or trauma to this wrist. She reports pain that has been present off and on for a couple years. She has increased pain over the last couple weeks with twisting range of motion. There has been no swelling or redness that she admits. She has not tried to take anything for the pain.  Review of Systems Negative, with the exception of above mentioned in HPI  Objective:   Physical Exam BP 126/88  Pulse 91  Temp(Src) 98.1 F (36.7 C) (Oral)  Ht 5\' 7"  (1.702 m)  Wt 258 lb (117.028 kg)  BMI 40.40 kg/m2  LMP 06/11/2013 GEN: Talkative, Obese Caucasian bipolar female. MSK: No erythema, no swelling. Tenderness over right ulnar aspect of wrist mid forearm. Full range of motion. Negative Tinel's at the elbow and wrist. Negative Wynn Maudlin. GYN:  External genitalia within normal limits, mild erythema labia majoria.  Vaginal mucosa pink, moist, normal rugae.  Mild friable  cervix without lesions, clear discharge, spotting bleeding noted on speculum exam.  Bimanual exam revealed normal,  nongravid uterus.  No cervical motion tenderness. No adnexal masses bilaterally. TTP left lower quadrant.

## 2013-06-17 NOTE — Patient Instructions (Signed)
Cervical Cancer The cervix is the opening and bottom part of the uterus between the vagina and the uterus. Cervical cancer is a fairly common cancer. It occurs most often in women between the ages of 40 years and 55 years. Cells of the cervix act very much like skin cells. These cells are exposed to toxins, viruses, and bacteria that may cause abnormal changes.  There are two kinds of cancers of the cervix:   Squamous cell carcinoma This type of cancer starts in the flat or scale-like cells that line the cervix. Squamous cell carcinoma can develop from a sexually transmitted infection caused by the human papillomavirus (HPV).   Adenocarcinoma This type of cervical cancer starts in glandular cells that line the cervix. RISK FACTORS The risk of getting cancer of the cervix is related to your lifestyle, sexual history, health, and immune system. Risks for cervical cancer include:   Having a sexually transmitted viral infection. These include:  Chlamydia.   Herpes.   HPV.  Becoming sexually active before age 18 years.   Having more than one sexual partner or having sex with someone who has more than one sexual partner.   Not using condoms with sexual partners.   Having had cancer of the vagina or vulva.   Having a sexual partner who has or had cancer of the penis or who has had a sexual partner with cervical dysplasia or cervical cancer.   Using oral contraceptives (also called birth control pills).  Smoking.   Having a weakened immune system. For example, human immunodeficiency virus (HIV) or other immune deficiency disorders.   Being the daughter of a woman who took diethylstilbestrol (DES) during pregnancy.   Having a sister or mother who has had cancer of the cervix.   Being African American, Hispanic, Asian, or a woman from the [place].   A history of dysplasia of the cervix. SIGNS AND SYMPTOMS  Symptoms are usually not present in the early stages of  cervical cancer. Once the cancer invades the cervix and surrounding tissues, the woman may have:   Abnormal vaginal bleeding or menstrual bleeding that is longer or heavier than usual.   Bleeding after intercourse, douching, or a Pap test.   Vaginal bleeding following menopause.   Abnormal vaginal discharge.  Pelvic discomfort or pain.  An abnormal Pap test.  Pain during sexual intercourse. Symptoms of more advanced cervical cancer may include:   Loss of appetite or weight loss.   Tiredness (fatigue).   Back and leg pain.   Inability to control urination or bowel movements. DIAGNOSIS  A pelvic exam and Pap test are done to diagnose the condition. If abnormalities are found during the exam or Pap test, the Pap test may be repeated in 3 months, or your health care provider may do additional tests or procedures, such as:   A colposcopy This is a procedure that uses a special microscope that allows the health care provider to magnify and closely examine the cells of the cervix, vagina, and vulva.   Cervical biopsies This is a procedure where small tissue samples are taken from the cervix to be examined under a microscope by a specialist.   A cone biopsy This is a procedure to test for or remove cancerous tissue.  Other tests may be needed, including:   Cystoscopy.   Proctoscopy or sigmoidoscopy.   Ultrasound.   CT scan.   MRI.   Laparoscopy.  There are different stages of cervical cancer:   Stage   0, carcinoma in situ (CIS) This first stage of cancer is the last and most serious stage of dysplasia.   Stage 1 This means the tumor is in the uterus and cervix only.   Stage 2 This means the tumor has spread to the upper vagina. The cancer has spread beyond the uterus, but not to the pelvic walls or lower third of the vagina.   Stage 3 This means the tumor has invaded the side wall of the pelvis and the lower third of the vagina. Blockage of the tubes  that carry urine (ureters) from the tumor may cause urine to back up and cause the kidneys to swell (hydronephrosis).   Stage 4 This means the tumor has spread to the rectum or bladder. In the later part of this stage, it has also spread to distant organs, like the lungs.  TREATMENT  Treatment options can include:   Cone biopsy to remove the cancerous tissue.   Removal of the entire uterus and cervix.   Removal of the uterus, cervix, upper vagina, lymph nodes, and surrounding tissue (modified radical hysterectomy). The ovaries may be left in place or removed.   Medicines to treat cancer.   A combination of surgery, radiation, and chemotherapy.   Biological response modifiers. These are substances that help strengthen your immune system's fight against cancer or infection. They may be used in combination with chemotherapy.  HOME CARE INSTRUCTIONS   Get a gynecology exam and Pap test once every year or as directed by your health care provider.   Get the HPV vaccine.   Do not smoke.  Do not have sexual intercourse until your health care provider says it is okay.  Use a condom every time you have sex. SEEK MEDICAL CARE IF:   You have increased pelvic pain or pressure.   Your are becoming increasingly tired.   You have increased leg or back pain.   You have a fever.  You have abnormal bleeding or discharge.  You lose weight. SEEK IMMEDIATE MEDICAL CARE IF:   You cannot urinate.  You have blood in your urine.   You have blood or pressure with a bowel movement.   You develop severe back, stomach, or pelvic pain. Document Released: 02/18/2005 Document Revised: 10/21/2012 Document Reviewed: 08/12/2012 [employer] Patient Information 2014 [employer].  

## 2013-06-17 NOTE — Telephone Encounter (Signed)
Relayed message,patient voiced understanding.[place]

## 2013-06-17 NOTE — Assessment & Plan Note (Signed)
Pap smear, gonorrhea and Chlamydia completed today. I will call the patient with the results of her tests. Ultrasound ordered for lower left abdominal pain in the general location of the ovaries, on pelvic exam. Patient is to continue Sprintec daily. Blood work for HIV/RPR/hepatitis today.

## 2013-06-17 NOTE — Assessment & Plan Note (Signed)
Called in flagyl for treatment.

## 2013-06-17 NOTE — Telephone Encounter (Signed)
Please call Ms. Pant and inform her she does have a bacteria infection that was resulted on her wet prep slide today. This is not a sexually transmitted disease. I have called in flagyl for her to take. I will call her after all her other labs become available. Thanks.

## 2013-06-18 LAB — HEPATITIS PANEL, ACUTE
HCV AB: NEGATIVE
HEP A IGM: NONREACTIVE
HEP B C IGM: NONREACTIVE
Hepatitis B Surface Ag: NEGATIVE

## 2013-06-18 LAB — HIV ANTIBODY (ROUTINE TESTING W REFLEX): HIV: NONREACTIVE

## 2013-06-18 LAB — RPR

## 2013-06-21 ENCOUNTER — Telehealth: Payer: Self-pay | Admitting: Family Medicine

## 2013-06-21 ENCOUNTER — Encounter: Payer: Self-pay | Admitting: Family Medicine

## 2013-06-21 NOTE — Telephone Encounter (Signed)
Please call pt and inform her all her test were negative, including her PAP. I will send her a copy of all labs. Thanks.

## 2013-06-21 NOTE — Telephone Encounter (Signed)
Relayed message,patient voiced understanding.[place]

## 2013-06-24 ENCOUNTER — Other Ambulatory Visit: Payer: Self-pay | Admitting: Family Medicine

## 2013-06-24 ENCOUNTER — Ambulatory Visit (HOSPITAL_COMMUNITY)
Admission: RE | Admit: 2013-06-24 | Discharge: 2013-06-24 | Disposition: A | Discharge status: Home / Self Care | Payer: Medicaid Other | Source: Ambulatory Visit | Attending: Family Medicine | Admitting: Family Medicine

## 2013-06-24 ENCOUNTER — Encounter: Payer: Self-pay | Admitting: Family Medicine

## 2013-06-24 DIAGNOSIS — R1032 Left lower quadrant pain: Secondary | ICD-10-CM

## 2013-06-24 DIAGNOSIS — N838 Other noninflammatory disorders of ovary, fallopian tube and broad ligament: Secondary | ICD-10-CM | POA: Insufficient documentation

## 2013-06-24 DIAGNOSIS — IMO0002 Reserved for concepts with insufficient information to code with codable children: Secondary | ICD-10-CM | POA: Insufficient documentation

## 2013-06-25 ENCOUNTER — Telehealth: Payer: Self-pay | Admitting: *Deleted

## 2013-06-25 NOTE — Telephone Encounter (Signed)
Patient came in to office requesting her [name] results

## 2013-06-28 NOTE — Telephone Encounter (Signed)
Pt reportedly came into the office requesting a copy of her [name] results. They were mailed to her last week and she should be receiving them soon if she has not already. Thanks.

## 2013-06-28 NOTE — Telephone Encounter (Signed)
Relayed message.patient voiced understanding.[place]

## 2013-07-01 ENCOUNTER — Ambulatory Visit: Payer: Medicaid Other | Admitting: Family Medicine

## 2013-07-02 ENCOUNTER — Ambulatory Visit: Payer: Medicaid Other

## 2013-07-19 ENCOUNTER — Ambulatory Visit (INDEPENDENT_AMBULATORY_CARE_PROVIDER_SITE_OTHER): Payer: Medicaid Other | Admitting: Family Medicine

## 2013-07-19 ENCOUNTER — Encounter: Payer: Self-pay | Admitting: Family Medicine

## 2013-07-19 VITALS — BP 137/70 | HR 79 | Temp 99.3°F | Ht 67.0 in | Wt 253.0 lb

## 2013-07-19 DIAGNOSIS — R109 Unspecified abdominal pain: Secondary | ICD-10-CM

## 2013-07-19 DIAGNOSIS — B9689 Other specified bacterial agents as the cause of diseases classified elsewhere: Secondary | ICD-10-CM

## 2013-07-19 DIAGNOSIS — E8809 Other disorders of plasma-protein metabolism, not elsewhere classified: Secondary | ICD-10-CM | POA: Insufficient documentation

## 2013-07-19 DIAGNOSIS — E282 Polycystic ovarian syndrome: Secondary | ICD-10-CM

## 2013-07-19 DIAGNOSIS — F319 Bipolar disorder, unspecified: Secondary | ICD-10-CM | POA: Insufficient documentation

## 2013-07-19 DIAGNOSIS — A499 Bacterial infection, unspecified: Secondary | ICD-10-CM

## 2013-07-19 DIAGNOSIS — N760 Acute vaginitis: Secondary | ICD-10-CM

## 2013-07-19 LAB — COMPREHENSIVE METABOLIC PANEL
ALK PHOS: 50 U/L (ref 39–117)
ALT: 16 U/L (ref 0–35)
AST: 15 U/L (ref 0–37)
Albumin: 4.2 g/dL (ref 3.5–5.2)
BILIRUBIN TOTAL: 0.4 mg/dL (ref 0.2–1.2)
BUN: 11 mg/dL (ref 6–23)
CO2: 28 mEq/L (ref 19–32)
CREATININE: 0.84 mg/dL (ref 0.50–1.10)
Calcium: 9.6 mg/dL (ref 8.4–10.5)
Chloride: 104 mEq/L (ref 96–112)
Glucose, Bld: 89 mg/dL (ref 70–99)
Potassium: 4.6 mEq/L (ref 3.5–5.3)
SODIUM: 138 meq/L (ref 135–145)
TOTAL PROTEIN: 7 g/dL (ref 6.0–8.3)

## 2013-07-19 NOTE — Assessment & Plan Note (Signed)
Recommended patient take her medication as prescribed. She has current restrictions at home for Flagyl.
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
## 2013-07-19 NOTE — Patient Instructions (Signed)
It was a pleasure seeing you today.  Please call Dr. Gwenlyn Saran for an appointment to help you with your anxiety.

## 2013-07-19 NOTE — Assessment & Plan Note (Addendum)
Uncertain accuracy of test completed at blood drive. Will perform CMP today. If albumin returns abnormal, will then consider possible etiologies and evaluate. Patient appears asymptomatic today.

## 2013-07-19 NOTE — Assessment & Plan Note (Signed)
>>  ASSESSMENT AND PLAN FOR POLYCYSTIC OVARIAN DISEASE WRITTEN ON 07/19/2013 10:11 AM BY KUNEFF, RENEE A, DO  Patient's ultrasound was reviewed today. Enlarged ovaries with multiple peripheral immature follicles, nonspecific. In the appropriate clinical setting, this would be compatible with the clinical diagnosis of PCOS. A. she has been started on Sprintec birth control pills and states that she has been some improvement in her lower abdominal pain. Need to followup with her in August and evaluate her improvement and at that time either add additional agent, such as Glucophage or possibly GYN referral.

## 2013-07-19 NOTE — Assessment & Plan Note (Signed)
Patient's ultrasound was reviewed today. Enlarged ovaries with multiple peripheral immature follicles, nonspecific. In the appropriate clinical setting, this would be compatible with the clinical diagnosis of PCOS. A. she has been started on Sprintec birth control pills and states that she has been some improvement in her lower abdominal pain. Need to followup with her in August and evaluate her improvement and at that time either add additional agent, such as Glucophage or possibly GYN referral.

## 2013-07-20 ENCOUNTER — Encounter: Payer: Self-pay | Admitting: Family Medicine

## 2013-07-23 ENCOUNTER — Telehealth: Payer: Self-pay | Admitting: Family Medicine

## 2013-07-23 NOTE — Telephone Encounter (Signed)
They have been mailed to her a;ready and she should be receiving.

## 2013-07-23 NOTE — Telephone Encounter (Signed)
Patient would like her lab results from 5/18. Patient states that results can be left on her VM. She will be at would from 11-7 pm

## 2013-08-06 ENCOUNTER — Ambulatory Visit (INDEPENDENT_AMBULATORY_CARE_PROVIDER_SITE_OTHER): Payer: Medicaid Other | Admitting: Family Medicine

## 2013-08-06 ENCOUNTER — Other Ambulatory Visit: Payer: Self-pay | Admitting: Family Medicine

## 2013-08-06 ENCOUNTER — Telehealth: Payer: Self-pay | Admitting: Family Medicine

## 2013-08-06 ENCOUNTER — Ambulatory Visit (HOSPITAL_COMMUNITY)
Admission: RE | Admit: 2013-08-06 | Discharge: 2013-08-06 | Disposition: A | Discharge status: Home / Self Care | Payer: Medicaid Other | Source: Ambulatory Visit | Attending: Family Medicine | Admitting: Family Medicine

## 2013-08-06 ENCOUNTER — Encounter: Payer: Self-pay | Admitting: Family Medicine

## 2013-08-06 VITALS — BP 149/84 | HR 87 | Temp 98.2°F | Ht 67.0 in | Wt 248.0 lb

## 2013-08-06 DIAGNOSIS — R05 Cough: Secondary | ICD-10-CM | POA: Insufficient documentation

## 2013-08-06 DIAGNOSIS — R109 Unspecified abdominal pain: Secondary | ICD-10-CM

## 2013-08-06 DIAGNOSIS — R197 Diarrhea, unspecified: Secondary | ICD-10-CM

## 2013-08-06 DIAGNOSIS — R062 Wheezing: Secondary | ICD-10-CM

## 2013-08-06 DIAGNOSIS — E282 Polycystic ovarian syndrome: Secondary | ICD-10-CM

## 2013-08-06 DIAGNOSIS — J069 Acute upper respiratory infection, unspecified: Secondary | ICD-10-CM

## 2013-08-06 DIAGNOSIS — R1031 Right lower quadrant pain: Secondary | ICD-10-CM

## 2013-08-06 DIAGNOSIS — R059 Cough, unspecified: Secondary | ICD-10-CM | POA: Insufficient documentation

## 2013-08-06 LAB — POCT UA - MICROSCOPIC ONLY

## 2013-08-06 LAB — POCT URINALYSIS DIPSTICK
Glucose, UA: NEGATIVE
KETONES UA: NEGATIVE
Leukocytes, UA: NEGATIVE
Nitrite, UA: NEGATIVE
PH UA: 6
Protein, UA: 100
Urobilinogen, UA: 1

## 2013-08-06 MED ORDER — METRONIDAZOLE 500 MG PO TABS: 500.0000 mg | ORAL_TABLET | Freq: Three times a day (TID) | ORAL | Status: DC

## 2013-08-06 MED ORDER — AZITHROMYCIN 250 MG PO TABS: ORAL_TABLET | ORAL | Status: DC

## 2013-08-06 MED ORDER — ALBUTEROL SULFATE HFA 108 (90 BASE) MCG/ACT IN AERS: 2.0000 | INHALATION_SPRAY | Freq: Four times a day (QID) | RESPIRATORY_TRACT | Status: DC | PRN

## 2013-08-06 NOTE — Telephone Encounter (Signed)
Spoke with patient and informed her of below 

## 2013-08-06 NOTE — Patient Instructions (Signed)
Diarrhea Diarrhea is frequent loose and watery bowel movements. It can cause you to feel weak and dehydrated. Dehydration can cause you to become tired and thirsty, have a dry mouth, and have decreased urination that often is dark yellow. Diarrhea is a sign of another problem, most often an infection that will not last long. In most cases, diarrhea typically lasts 2 3 days. However, it can last longer if it is a sign of something more serious. It is important to treat your diarrhea as directed by your caregive to lessen or prevent future episodes of diarrhea. CAUSES  Some common causes include:  Gastrointestinal infections caused by viruses, bacteria, or parasites.  Food poisoning or food allergies.  Certain medicines, such as antibiotics, chemotherapy, and laxatives.  Artificial sweeteners and fructose.  Digestive disorders. HOME CARE INSTRUCTIONS  Ensure adequate fluid intake (hydration): have 1 cup (8 oz) of fluid for each diarrhea episode. Avoid fluids that contain simple sugars or sports drinks, fruit juices, whole milk products, and sodas. Your urine should be clear or pale yellow if you are drinking enough fluids. Hydrate with an oral rehydration solution that you can purchase at pharmacies, retail stores, and online. You can prepare an oral rehydration solution at home by mixing the following ingredients together:    tsp table salt.   tsp baking soda.   tsp salt substitute containing potassium chloride.  1  tablespoons sugar.  1 L (34 oz) of water.  Certain foods and beverages may increase the speed at which food moves through the gastrointestinal (GI) tract. These foods and beverages should be avoided and include:  Caffeinated and alcoholic beverages.  High-fiber foods, such as raw fruits and vegetables, nuts, seeds, and whole grain breads and cereals.  Foods and beverages sweetened with sugar alcohols, such as xylitol, sorbitol, and mannitol.  Some foods may be well  tolerated and may help thicken stool including:  Starchy foods, such as rice, toast, pasta, low-sugar cereal, oatmeal, grits, baked potatoes, crackers, and bagels.  Bananas.  Applesauce.  Add probiotic-rich foods to help increase healthy bacteria in the GI tract, such as yogurt and fermented milk products.  Wash your hands well after each diarrhea episode.  Only take over-the-counter or prescription medicines as directed by your caregiver.  Take a warm bath to relieve any burning or pain from frequent diarrhea episodes. SEEK IMMEDIATE MEDICAL CARE IF:   You are unable to keep fluids down.  You have persistent vomiting.  You have blood in your stool, or your stools are black and tarry.  You do not urinate in 6 8 hours, or there is only a small amount of very dark urine.  You have abdominal pain that increases or localizes.  You have weakness, dizziness, confusion, or lightheadedness.  You have a severe headache.  Your diarrhea gets worse or does not get better.  You have a fever or persistent symptoms for more than 2 3 days.  You have a fever and your symptoms suddenly get worse. MAKE SURE YOU:   Understand these instructions.  Will watch your condition.  Will get help right away if you are not doing well or get worse. Document Released: 02/08/2002 Document Revised: 02/05/2012 Document Reviewed: 10/27/2011 Exit[employer] Patient Information 2014 [employer].  

## 2013-08-06 NOTE — Telephone Encounter (Signed)
Please [name] and let her know that her chest x-ray was normal today. Her for she has no signs of pneumonia. Her infection is all upper respiratory in her antibiotics should be sufficient. Thank you

## 2013-08-07 ENCOUNTER — Telehealth: Payer: Self-pay | Admitting: Family Medicine

## 2013-08-07 LAB — C. DIFFICILE GDH AND TOXIN A/B
C. DIFF TOXIN A/B: NOT DETECTED
C. difficile GDH: NOT DETECTED

## 2013-08-07 NOTE — Telephone Encounter (Signed)
Patient calling to ask if she can take motrin for pain while taking the two antibiotics that she was given today. She states that her mouth is very swollen and sore with a fracture tooth. I encouraged her to go to urgent care for evaluation tomorrow. She was agreeable.

## 2013-08-09 ENCOUNTER — Telehealth: Payer: Self-pay | Admitting: Family Medicine

## 2013-08-09 DIAGNOSIS — J069 Acute upper respiratory infection, unspecified: Secondary | ICD-10-CM | POA: Insufficient documentation

## 2013-08-09 NOTE — Assessment & Plan Note (Signed)
>>  ASSESSMENT AND PLAN FOR POLYCYSTIC OVARIAN DISEASE WRITTEN ON 08/09/2013 10:25 PM BY KUNEFF, RENEE A, DO  Abdominal pain could possible be from her PCOS vs IBS. Pt is now on BCP and is not insulin resistant. She continues to have abdominal pain monthly. [name] is consistent with PCOS and enlarged ovaries. Encouraged her to take NSAIDS for pain. I offered her a gyn referral today to be evaluated for further treatment.

## 2013-08-09 NOTE — Assessment & Plan Note (Signed)
Pt with URI, possible sinusitis as well. CXR clear. Given albuterol inhaler x1 for wheezing and comfort. Counseled  on proper use. Z-pack.

## 2013-08-09 NOTE — Telephone Encounter (Signed)
Spoke with patient and informed her of below 

## 2013-08-09 NOTE — Telephone Encounter (Signed)
Please call Mackenzie Gibson and inform her that her stool cultures are negative for anything concerning. She can start OTC anti-diarrheal and she is already for referred to GI. Thanks.

## 2013-08-09 NOTE — Assessment & Plan Note (Signed)
Abdominal pain could possible be from her PCOS vs IBS. Pt is now on BCP and is not insulin resistant. She continues to have abdominal pain monthly. [name] is consistent with PCOS and enlarged ovaries. Encouraged her to take NSAIDS for pain. I offered her a gyn referral today to be evaluated for further treatment.

## 2013-08-09 NOTE — Assessment & Plan Note (Addendum)
Uncertain etiology of diarrhea. Stool studies completed and are negative for C.diff and currently not growing suspicious colonies. Possibly IBS related. Concern for unintentional weight loss of 9 pounds over the two months. No concerning family history. Will refer to gastro for further evaluation.

## 2013-08-09 NOTE — Progress Notes (Signed)
   Subjective:    Patient ID: Mackenzie Gibson, female    DOB: 1985/08/31, 28 y.o.   MRN: [id number]  HPI Mackenzie Gibson is a 28 y.o. female presents to [facility] with multiple complaints.   Abdominal pain/PCOS/Diarrhea: Pt states that her ovaries are hurting her again. She feels it is related to her PCOS. She points to her RLL today, in the past her pain has been LLL. She currently on her menses and she reports her menstrual cycle is heavier this month than it has been in the past. She admits to missing some of her pills last week and increase stress in the home. She has a recent US last month, that was normal, outside of enlarged ovaries with multiple peripheral immature follicles. She has a past diagnosis of PCOS and insulin resistance, per patient. Current lab work does not show her to be insulin resistant now. She has been on BCP for 2 months. Pt reports being on metformin as well in the past for her insulin resistance.  In addition she has had diarrhea for about a month, that she states she thinks it is from the BCP. She has not tried any medications for her diarrhea. She reports unintentional weight loss over the last few months, confirmed by office notes 9 pounds in about 2 months. She is under new stressors in the home. She recently became employed. She works in a [employer]. She denies recent travel or antibiotic use. She denies melana, hematochezia, or tenesmus. She denies RUQ pain, dysuria or fever. She does not have her gallbladder.   URI: Pt reports she starting to feel bad a little over a week ago. She endorses her chest hurting, congestion and rhinorrhea, mild cough, sore throat, facial pressure and above mentioned chronic diarrhea. Her children have also been ill. She denies fever, chills or myalgias. She had an inhaler from prior episode of URI that seemed to help. She has no history of asthma. She is an every day smoker.   Review of Systems Negative, with the  exception of above mentioned in HPI     Objective:   Physical Exam BP 149/84  Pulse 87  Temp(Src) 98.2 F (36.8 C) (Oral)  Ht 5\' 7"  (4.098 m)  Wt 248 lb (112.492 kg)  BMI 38.83 kg/m2  SpO2 98%  LMP 08/06/2013 Gen: NAD. Nontoxic in appearance.   HEENT: AT. [place]. Bilateral TM visualized and normal in appearance. Bilateral eyes without injections or icterus. MMM. Bilateral nares with erythema. Throat without erythema or exudates. Facial tenderness L>R maxillary sinus.  Neck: No LAD, supple. CV: RRR  Chest:Diffuse mild- moderate wheezing, rhonchi present, no crackles. Abd: Soft. Obese. ND. Mild TTP RLL.BS Present. No suprapubic tenderness, No RUQ tenderness. No Masses palpated. Negative Mc Burney's point.

## 2013-08-10 LAB — STOOL CULTURE

## 2013-08-19 ENCOUNTER — Emergency Department (HOSPITAL_COMMUNITY)
Admission: EM | Admit: 2013-08-19 | Discharge: 2013-08-20 | Disposition: A | Discharge status: Home / Self Care | Payer: Medicaid Other | Attending: Emergency Medicine | Admitting: Emergency Medicine

## 2013-08-19 ENCOUNTER — Encounter (HOSPITAL_COMMUNITY): Payer: Self-pay | Admitting: Emergency Medicine

## 2013-08-19 DIAGNOSIS — Z79899 Other long term (current) drug therapy: Secondary | ICD-10-CM | POA: Insufficient documentation

## 2013-08-19 DIAGNOSIS — F17200 Nicotine dependence, unspecified, uncomplicated: Secondary | ICD-10-CM | POA: Insufficient documentation

## 2013-08-19 DIAGNOSIS — Z8659 Personal history of other mental and behavioral disorders: Secondary | ICD-10-CM | POA: Insufficient documentation

## 2013-08-19 DIAGNOSIS — Z8639 Personal history of other endocrine, nutritional and metabolic disease: Secondary | ICD-10-CM | POA: Insufficient documentation

## 2013-08-19 DIAGNOSIS — J029 Acute pharyngitis, unspecified: Secondary | ICD-10-CM | POA: Insufficient documentation

## 2013-08-19 DIAGNOSIS — Z862 Personal history of diseases of the blood and blood-forming organs and certain disorders involving the immune mechanism: Secondary | ICD-10-CM | POA: Insufficient documentation

## 2013-08-19 DIAGNOSIS — Z792 Long term (current) use of antibiotics: Secondary | ICD-10-CM | POA: Insufficient documentation

## 2013-08-19 LAB — CBC WITH DIFFERENTIAL/PLATELET
BASOS PCT: 1 % (ref 0–1)
Basophils Absolute: 0 10*3/uL (ref 0.0–0.1)
Eosinophils Absolute: 0.3 10*3/uL (ref 0.0–0.7)
Eosinophils Relative: 4 % (ref 0–5)
HEMATOCRIT: 40.9 % (ref 36.0–46.0)
HEMOGLOBIN: 14.4 g/dL (ref 12.0–15.0)
LYMPHS ABS: 3.1 10*3/uL (ref 0.7–4.0)
LYMPHS PCT: 38 % (ref 12–46)
MCH: 30.1 pg (ref 26.0–34.0)
MCHC: 35.2 g/dL (ref 30.0–36.0)
MCV: 85.4 fL (ref 78.0–100.0)
MONO ABS: 0.4 10*3/uL (ref 0.1–1.0)
Monocytes Relative: 4 % (ref 3–12)
NEUTROS ABS: 4.2 10*3/uL (ref 1.7–7.7)
Neutrophils Relative %: 53 % (ref 43–77)
Platelets: 189 10*3/uL (ref 150–400)
RBC: 4.79 MIL/uL (ref 3.87–5.11)
RDW: 13.3 % (ref 11.5–15.5)
WBC: 8 10*3/uL (ref 4.0–10.5)

## 2013-08-19 MED ORDER — IBUPROFEN 400 MG PO TABS
600.0000 mg | ORAL_TABLET | Freq: Once | ORAL | Status: AC
  Administered 2013-08-19: 600 mg via ORAL
  Filled 2013-08-19: qty 2

## 2013-08-19 MED ORDER — OXYCODONE-ACETAMINOPHEN 5-325 MG PO TABS
1.0000 | ORAL_TABLET | Freq: Once | ORAL | Status: AC
  Administered 2013-08-20: 1 via ORAL
  Filled 2013-08-19 (×2): qty 1

## 2013-08-19 NOTE — ED Provider Notes (Signed)
CSN: [id number]     Arrival date & time 08/19/13  2139 History   First MD Initiated Contact with Patient 08/19/13 2304 This chart was scribed for Mackenzie Morn, MD by Mackenzie Gibson, ED Scribe. This patient was seen in room APA18/APA18 and the patient's care was started at 11:17 PM.     Chief Complaint  Patient presents with  . Sore Throat     The history is provided by the patient. No language interpreter was used.   HPI Comments: Mackenzie Gibson is a 28 y.o. female who presents to the Emergency Department complaining of a swollen throat, with associated soreness, onset earlier today. She reports noticing white pus filled bumps at the back of her throat today and reports difficulty swallowing. She reports cold symptoms, including productive cough, SOB, and chest tightness onset 3 weeks ago. She has tried Zpack put on Zithromycin. She denies fever, and any other associated symptoms. She reports h/o smoking. She denies any medical history.   PCP - Howard Pouch, DO  Past Medical History  Diagnosis Date  . PCOS (polycystic ovarian syndrome)   . Depression    Past Surgical History  Procedure Laterality Date  . Cholecystectomy    . Eye surgery     Family History  Problem Relation Age of Onset  . Depression Mother   . Depression Father    History  Substance Use Topics  . Smoking status: Current Every Day Smoker -- 0.30 packs/day    Types: Cigarettes  . Smokeless tobacco: Not on file  . Alcohol Use: No   OB History   Grav Para Term Preterm Abortions TAB SAB Ect Mult Living   2 2 2  0 0 0 0 0 0 2     Review of Systems A complete 10 system review of systems was obtained and all systems are negative except as noted in the HPI and PMH.   Allergies  Hydrocodone  Home Medications   Prior to Admission medications   Medication Sig Start Date End Date Taking? Authorizing Provider  albuterol (PROVENTIL HFA;VENTOLIN HFA) 108 (90 BASE) MCG/ACT inhaler Inhale 2 puffs into the lungs  every 6 (six) hours as needed for wheezing or shortness of breath. 08/06/13  Yes Renee A Kuneff, DO  norgestimate-ethinyl estradiol (SPRINTEC 28) 0.25-35 MG-MCG tablet Take 1 tablet by mouth daily. 05/27/13  Yes Renee A Kuneff, DO  azithromycin (ZITHROMAX Z-PAK) 250 MG tablet Take taper pack 08/06/13   Renee A Kuneff, DO  metroNIDAZOLE (FLAGYL) 500 MG tablet Take 1 tablet (500 mg total) by mouth 3 (three) times daily. 08/06/13   Renee A Kuneff, DO   BP 146/83  Pulse 90  Temp(Src) 98.2 F (36.8 C) (Oral)  Resp 20  Ht 5\' 7"  (1.702 m)  Wt 255 lb (115.667 kg)  BMI 39.93 kg/m2  SpO2 97%  LMP 08/06/2013 Physical Exam  Nursing note and vitals reviewed. Constitutional: She is oriented to person, place, and time. She appears well-developed and well-nourished. No distress.  HENT:  Head: Normocephalic and atraumatic.  Right Ear: External ear normal.  Left Ear: External ear normal.  Mouth/Throat: Uvula is midline, oropharynx is clear and moist and mucous membranes are normal. No oropharyngeal exudate, posterior oropharyngeal edema or tonsillar abscesses.  Several small bilateral white pinpoint plaques of her anterior tonsillar pillar. No dental tenderness. No obvious dental decay. No trismus. Tolerating secretions.   Eyes: Conjunctivae and EOM are normal.  Neck: Normal range of motion. Neck supple.  Cardiovascular: Normal rate.  Pulmonary/Chest: Effort normal. No respiratory distress.  Abdominal: She exhibits no distension.  Musculoskeletal: Normal range of motion.  Neurological: She is alert and oriented to person, place, and time.  Skin: Skin is warm and dry.  Psychiatric: She has a normal mood and affect. Her behavior is normal.    ED Course  Procedures (including critical care time)  DIAGNOSTIC STUDIES: Oxygen Saturation is 97% on room air, normal by my interpretation.    COORDINATION OF CARE: 11:20 PM- Discussed suspicion of viral infection with pt. Discussed treatment plan which  includes pain medication and blood work with pt at bedside and pt agreed to plan. Advised pt to hydrate, take pain medication and antiinflammatory.   Medications  oxyCODONE-acetaminophen (PERCOCET/ROXICET) 5-325 MG per tablet 1 tablet (0 tablets Oral Hold 08/19/13 2338)  ibuprofen (ADVIL,MOTRIN) tablet 600 mg (600 mg Oral Given 08/19/13 2338)    Results for orders placed during the hospital encounter of 08/19/13  CBC WITH DIFFERENTIAL      Result Value Ref Range   WBC 8.0  4.0 - 10.5 K/uL   RBC 4.79  3.87 - 5.11 MIL/uL   Hemoglobin 14.4  12.0 - 15.0 g/dL   HCT 40.9  36.0 - 46.0 %   MCV 85.4  78.0 - 100.0 fL   MCH 30.1  26.0 - 34.0 pg   MCHC 35.2  30.0 - 36.0 g/dL   RDW 13.3  11.5 - 15.5 %   Platelets 189  150 - 400 K/uL   Neutrophils Relative % 53  43 - 77 %   Neutro Abs 4.2  1.7 - 7.7 K/uL   Lymphocytes Relative 38  12 - 46 %   Lymphs Abs 3.1  0.7 - 4.0 K/uL   Monocytes Relative 4  3 - 12 %   Monocytes Absolute 0.4  0.1 - 1.0 K/uL   Eosinophils Relative 4  0 - 5 %   Eosinophils Absolute 0.3  0.0 - 0.7 K/uL   Basophils Relative 1  0 - 1 %   Basophils Absolute 0.0  0.0 - 0.1 K/uL  MONONUCLEOSIS SCREEN      Result Value Ref Range   Mono Screen NEGATIVE  NEGATIVE  RAPID HIV SCREEN ([facility])      Result Value Ref Range   SUDS Rapid HIV Screen NON REACTIVE  NON REACTIVE     EKG Interpretation None      MDM   Final diagnoses:  Pharyngitis   Patient Richard antibiotics and pain medicine.  Some of the lesions may represent more of her herpangina like process.  ENT referral.  Mono negative.  HIV negative.  White count normal.  Discharge home in good condition.  Recommended oral hydration    I personally performed the services described in this documentation, which was scribed in my presence. The recorded information has been reviewed and is accurate.       Mackenzie Morn, MD 08/20/13 [phone]

## 2013-08-19 NOTE — ED Notes (Signed)
My whole throat feels like it is swollen and it has bumps in the back of my throat. Hard to swallow, feels like it is closing up per pt. Still coughing up stuff and hurting in my chest.

## 2013-08-20 LAB — MONONUCLEOSIS SCREEN: Mono Screen: NEGATIVE

## 2013-08-20 LAB — RAPID HIV SCREEN (WH-MAU): SUDS RAPID HIV SCREEN: NONREACTIVE

## 2013-08-20 MED ORDER — OXYCODONE-ACETAMINOPHEN 5-325 MG PO TABS: 1.0000 | ORAL_TABLET | ORAL | Status: DC | PRN

## 2013-08-20 MED ORDER — IBUPROFEN 600 MG PO TABS: 600.0000 mg | ORAL_TABLET | Freq: Three times a day (TID) | ORAL | Status: DC | PRN

## 2013-08-20 MED ORDER — CLINDAMYCIN HCL 300 MG PO CAPS: 300.0000 mg | ORAL_CAPSULE | Freq: Four times a day (QID) | ORAL | Status: DC

## 2013-09-06 ENCOUNTER — Emergency Department (HOSPITAL_COMMUNITY)
Admission: EM | Admit: 2013-09-06 | Discharge: 2013-09-06 | Disposition: A | Discharge status: Home / Self Care | Payer: Medicaid Other | Attending: Emergency Medicine | Admitting: Emergency Medicine

## 2013-09-06 ENCOUNTER — Emergency Department (HOSPITAL_COMMUNITY): Payer: Medicaid Other

## 2013-09-06 ENCOUNTER — Encounter (HOSPITAL_COMMUNITY): Payer: Self-pay | Admitting: Emergency Medicine

## 2013-09-06 DIAGNOSIS — Z792 Long term (current) use of antibiotics: Secondary | ICD-10-CM | POA: Insufficient documentation

## 2013-09-06 DIAGNOSIS — Y9289 Other specified places as the place of occurrence of the external cause: Secondary | ICD-10-CM | POA: Diagnosis not present

## 2013-09-06 DIAGNOSIS — Z862 Personal history of diseases of the blood and blood-forming organs and certain disorders involving the immune mechanism: Secondary | ICD-10-CM | POA: Diagnosis not present

## 2013-09-06 DIAGNOSIS — Z8659 Personal history of other mental and behavioral disorders: Secondary | ICD-10-CM | POA: Diagnosis not present

## 2013-09-06 DIAGNOSIS — S93409A Sprain of unspecified ligament of unspecified ankle, initial encounter: Secondary | ICD-10-CM | POA: Diagnosis not present

## 2013-09-06 DIAGNOSIS — S8990XA Unspecified injury of unspecified lower leg, initial encounter: Secondary | ICD-10-CM | POA: Diagnosis present

## 2013-09-06 DIAGNOSIS — Y990 Civilian activity done for income or pay: Secondary | ICD-10-CM | POA: Diagnosis not present

## 2013-09-06 DIAGNOSIS — Y9389 Activity, other specified: Secondary | ICD-10-CM | POA: Insufficient documentation

## 2013-09-06 DIAGNOSIS — S7011XA Contusion of right thigh, initial encounter: Secondary | ICD-10-CM

## 2013-09-06 DIAGNOSIS — S7010XA Contusion of unspecified thigh, initial encounter: Secondary | ICD-10-CM | POA: Diagnosis not present

## 2013-09-06 DIAGNOSIS — Z8639 Personal history of other endocrine, nutritional and metabolic disease: Secondary | ICD-10-CM | POA: Insufficient documentation

## 2013-09-06 DIAGNOSIS — Z79899 Other long term (current) drug therapy: Secondary | ICD-10-CM | POA: Diagnosis not present

## 2013-09-06 DIAGNOSIS — W010XXA Fall on same level from slipping, tripping and stumbling without subsequent striking against object, initial encounter: Secondary | ICD-10-CM | POA: Insufficient documentation

## 2013-09-06 DIAGNOSIS — S93401A Sprain of unspecified ligament of right ankle, initial encounter: Secondary | ICD-10-CM

## 2013-09-06 DIAGNOSIS — F17200 Nicotine dependence, unspecified, uncomplicated: Secondary | ICD-10-CM | POA: Diagnosis not present

## 2013-09-06 DIAGNOSIS — S99919A Unspecified injury of unspecified ankle, initial encounter: Secondary | ICD-10-CM | POA: Diagnosis present

## 2013-09-06 MED ORDER — IBUPROFEN 600 MG PO TABS: 600.0000 mg | ORAL_TABLET | Freq: Four times a day (QID) | ORAL | Status: DC | PRN

## 2013-09-06 MED ORDER — IBUPROFEN 800 MG PO TABS
800.0000 mg | ORAL_TABLET | Freq: Once | ORAL | Status: AC
  Administered 2013-09-06: 800 mg via ORAL

## 2013-09-06 MED ORDER — KETOROLAC TROMETHAMINE 60 MG/2ML IM SOLN
60.0000 mg | Freq: Once | INTRAMUSCULAR | Status: DC
  Filled 2013-09-06: qty 2

## 2013-09-06 MED ORDER — IBUPROFEN 800 MG PO TABS
ORAL_TABLET | ORAL | Status: AC
  Filled 2013-09-06: qty 1

## 2013-09-06 NOTE — Discharge Instructions (Signed)

## 2013-09-06 NOTE — ED Provider Notes (Signed)
CSN: [id number]     Arrival date & time 09/06/13  0142 History   First MD Initiated Contact with Patient 09/06/13 0157     Chief Complaint  Patient presents with  . Leg Pain     (Consider location/radiation/quality/duration/timing/severity/associated sxs/prior Treatment) HPI Patient slipped and fell at work at roughly 8 hours ago. She landed on her right side. She continued to work the rest of her shift. She drove home. She tried to go to sleep and was having pain. She decided to come to the emergency room to be evaluated. Her main complaint is right ankle pain. She says she's had some swelling over the site. She also has tenderness along the lateral surface of her right thigh. Denies any neck or head injury. She has no focal weakness or numbness. Past Medical History  Diagnosis Date  . PCOS (polycystic ovarian syndrome)   . Depression    Past Surgical History  Procedure Laterality Date  . Cholecystectomy    . Eye surgery     Family History  Problem Relation Age of Onset  . Depression Mother   . Depression Father    History  Substance Use Topics  . Smoking status: Current Every Day Smoker -- 0.30 packs/day    Types: Cigarettes  . Smokeless tobacco: Not on file  . Alcohol Use: No   OB History   Grav Para Term Preterm Abortions TAB SAB Ect Mult Living   2 2 2  0 0 0 0 0 0 2     Review of Systems  HENT: Negative for facial swelling.   Respiratory: Negative for shortness of breath.   Cardiovascular: Negative for chest pain.  Gastrointestinal: Negative for nausea, vomiting and abdominal pain.  Musculoskeletal: Positive for joint swelling and myalgias. Negative for back pain, neck pain and neck stiffness.  Skin: Negative for rash and wound.  Neurological: Negative for dizziness, syncope, weakness, light-headedness, numbness and headaches.  All other systems reviewed and are negative.     Allergies  Hydrocodone  Home Medications   Prior to Admission medications    Medication Sig Start Date End Date Taking? Authorizing Provider  albuterol (PROVENTIL HFA;VENTOLIN HFA) 108 (90 BASE) MCG/ACT inhaler Inhale 2 puffs into the lungs every 6 (six) hours as needed for wheezing or shortness of breath. 08/06/13   Renee A Kuneff, DO  azithromycin (ZITHROMAX Z-PAK) 250 MG tablet Take taper pack 08/06/13   Renee A Kuneff, DO  clindamycin (CLEOCIN) 300 MG capsule Take 1 capsule (300 mg total) by mouth 4 (four) times daily. 08/20/13   [name], MD  ibuprofen (ADVIL,MOTRIN) 600 MG tablet Take 1 tablet (600 mg total) by mouth every 8 (eight) hours as needed. 08/20/13   [name], MD  metroNIDAZOLE (FLAGYL) 500 MG tablet Take 1 tablet (500 mg total) by mouth 3 (three) times daily. 08/06/13   Renee A Kuneff, DO  norgestimate-ethinyl estradiol (SPRINTEC 28) 0.25-35 MG-MCG tablet Take 1 tablet by mouth daily. 05/27/13   Renee A Kuneff, DO  oxyCODONE-acetaminophen (PERCOCET/ROXICET) 5-325 MG per tablet Take 1 tablet by mouth every 4 (four) hours as needed for severe pain. 08/20/13   [name], MD   BP 129/69  Pulse 97  Temp(Src) 98.6 F (37 C) (Oral)  Resp 20  Ht 5\' 7"  (1.702 m)  Wt 242 lb (109.77 kg)  BMI 37.89 kg/m2  SpO2 99%  LMP 09/02/2013 Physical Exam  Nursing note and vitals reviewed. Constitutional: She is oriented to person, place, and time.  She appears well-developed and well-nourished. No distress.  HENT:  Head: Normocephalic and atraumatic.  Mouth/Throat: Oropharynx is clear and moist.  Eyes: EOM are normal. Pupils are equal, round, and reactive to light.  Neck: Normal range of motion. Neck supple.  No posterior midline cervical tenderness to palpation.  Cardiovascular: Normal rate and regular rhythm.   Pulmonary/Chest: Effort normal and breath sounds normal. No respiratory distress. She has no wheezes. She has no rales.  Abdominal: Soft. Bowel sounds are normal. She exhibits no distension and no mass. There is no tenderness. There is no rebound and  no guarding.  Musculoskeletal: Normal range of motion. She exhibits no edema and no tenderness.  Patient with mild swelling over the lateral surface of the foot and right ankle. She has mild tenderness to palpation over the lateral malleolus. There is no obvious contusion. She has good distal pulses. Patient has full range of motion of her right knee. There is no ligamentous instability. Patient has full range of motion of her right hip. She has mild tenderness to palpation over the lateral surface of her right thigh without any evidence of deformity. She has no midline thoracic or lumbar tenderness to palpation.  Neurological: She is alert and oriented to person, place, and time.  Patient is alert and oriented x3 with clear, goal oriented speech. Patient has 5/5 motor in all extremities. Sensation is intact to light touch. Patient has a normal gait and walks without assistance.   Skin: Skin is warm and dry. No rash noted. No erythema.  Psychiatric: She has a normal mood and affect. Her behavior is normal.    ED Course  Procedures (including critical care time) Labs Review Labs Reviewed - No data to display  Imaging Review No results found.   EKG Interpretation None      MDM   Final diagnoses:  None    I doubt any bony injury given the benign physical exam and delayed presentation. Will get x-ray of the right ankle and treat for sprain. No evidence of fracture. Ace wrap applied   Julianne Rice, MD 09/06/13 [phone]

## 2013-09-06 NOTE — ED Notes (Signed)
Pt is able to ambulate but reports increased pain with squatting or rotating ankle.

## 2013-09-06 NOTE — ED Notes (Signed)
Pt states she would rather have a pill than a shot. EDP made aware.

## 2013-09-06 NOTE — ED Notes (Signed)
Pt reports slipping on grease at work. C/o pain from the right shin to the foot.

## 2013-10-04 ENCOUNTER — Ambulatory Visit: Payer: Medicaid Other | Admitting: Family Medicine

## 2013-12-20 ENCOUNTER — Encounter: Payer: Self-pay | Admitting: Family Medicine

## 2013-12-20 ENCOUNTER — Ambulatory Visit (INDEPENDENT_AMBULATORY_CARE_PROVIDER_SITE_OTHER): Payer: Medicaid Other | Admitting: Family Medicine

## 2013-12-20 VITALS — BP 135/80 | HR 72 | Wt 243.0 lb

## 2013-12-20 DIAGNOSIS — L301 Dyshidrosis [pompholyx]: Secondary | ICD-10-CM

## 2013-12-20 DIAGNOSIS — N912 Amenorrhea, unspecified: Secondary | ICD-10-CM

## 2013-12-20 LAB — POCT URINE PREGNANCY: PREG TEST UR: NEGATIVE

## 2013-12-20 MED ORDER — TRIAMCINOLONE ACETONIDE 0.5 % EX OINT: 1.0000 "application " | TOPICAL_OINTMENT | Freq: Two times a day (BID) | CUTANEOUS | Status: DC

## 2013-12-20 NOTE — Assessment & Plan Note (Signed)
Likely related to chronic wet feet (from job) No evidence of fungal component at this time Will tx with triamcinolone Advised patient on proper hygiene  F/up in clinic PRN

## 2013-12-20 NOTE — Progress Notes (Signed)
Patient ID: [name], female   DOB: Sep 18, 1985, 28 y.o.   MRN: [id number]   [facility] Bernadene Bell, MD Phone: [phone]  Subjective:  Ms Renbarger is a 28 y.o F who presents today to [facility]  # Rash  -rash on feet for the last 3 weeks -feet never stay dry at work  -has been itching, progressively getting worse  -has used lotrimin which did not work  -no fevers, chills -has been nauseated every morning   All relevant systems were reviewed and were negative unless otherwise noted in the HPI  Past Medical History Reviewed problem list.  Medications- reviewed and updated Current Outpatient Prescriptions  Medication Sig Dispense Refill  . albuterol (PROVENTIL HFA;VENTOLIN HFA) 108 (90 BASE) MCG/ACT inhaler Inhale 2 puffs into the lungs every 6 (six) hours as needed for wheezing or shortness of breath.  1 Inhaler  0  . azithromycin (ZITHROMAX Z-PAK) 250 MG tablet Take taper pack  6 each  0  . clindamycin (CLEOCIN) 300 MG capsule Take 1 capsule (300 mg total) by mouth 4 (four) times daily.  28 capsule  0  . ibuprofen (ADVIL,MOTRIN) 600 MG tablet Take 1 tablet (600 mg total) by mouth every 8 (eight) hours as needed.  15 tablet  0  . ibuprofen (ADVIL,MOTRIN) 600 MG tablet Take 1 tablet (600 mg total) by mouth every 6 (six) hours as needed.  30 tablet  0  . metroNIDAZOLE (FLAGYL) 500 MG tablet Take 1 tablet (500 mg total) by mouth 3 (three) times daily.  21 tablet  0  . norgestimate-ethinyl estradiol (SPRINTEC 28) 0.25-35 MG-MCG tablet Take 1 tablet by mouth daily.  1 Package  11  . oxyCODONE-acetaminophen (PERCOCET/ROXICET) 5-325 MG per tablet Take 1 tablet by mouth every 4 (four) hours as needed for severe pain.  10 tablet  0   No current facility-administered medications for this visit.   Chief complaint-noted No additions to family history Social history- patient is a current every day smoker  Objective: BP 135/80  Pulse 72  Wt 243 lb (110.224 kg)   LMP 11/15/2013 Gen: NAD, alert, cooperative with exam HEENT: NCAT Neck: FROM, supple Skin: eczematous lesions involving bilat dorsal aspects of feet; no calluses or open wounds; no involvement of calves; sensation in tact  Assessment/Plan: See problem based a/p

## 2013-12-20 NOTE — Patient Instructions (Signed)
Ms Mackenzie Gibson it was great to meet you today!  I am sorry that you are not feeling well  Please return to clinic if symptoms do not improve or worsen Feel better soon Mackenzie Bell, MD  Please use topical steroid cream for 1-2 weeks ?Drying hands/feet thoroughly after washing ?Applying emollients (eg, petroleum jelly) immediately after hand drying and as often as possible ?Wearing cotton gloves under vinyl or other nonlatex gloves when performing wet work ?Removing rings and watches and bracelets before wet work ?Wearing protective gloves in cold weather ?Wearing task-specific gloves for frictional exposures (eg, gardening, carpentry) ?Avoiding exposure to irritants (eg, detergents, solvents, hair lotions or dyes, acidic foods [eg, citrus fruit])

## 2013-12-23 ENCOUNTER — Ambulatory Visit: Payer: Medicaid Other | Admitting: Family Medicine

## 2014-01-03 ENCOUNTER — Encounter: Payer: Self-pay | Admitting: Family Medicine

## 2014-01-12 ENCOUNTER — Encounter: Payer: Self-pay | Admitting: Family Medicine

## 2014-01-12 ENCOUNTER — Other Ambulatory Visit: Payer: Self-pay | Admitting: Family Medicine

## 2014-01-12 ENCOUNTER — Ambulatory Visit (INDEPENDENT_AMBULATORY_CARE_PROVIDER_SITE_OTHER): Payer: Medicaid Other | Admitting: Family Medicine

## 2014-01-12 ENCOUNTER — Other Ambulatory Visit (HOSPITAL_COMMUNITY)
Admission: RE | Admit: 2014-01-12 | Discharge: 2014-01-12 | Disposition: A | Discharge status: Home / Self Care | Payer: Medicaid Other | Source: Ambulatory Visit | Attending: Family Medicine | Admitting: Family Medicine

## 2014-01-12 VITALS — BP 136/81 | HR 79 | Temp 97.8°F | Ht 67.0 in | Wt 244.0 lb

## 2014-01-12 DIAGNOSIS — R102 Pelvic and perineal pain: Secondary | ICD-10-CM

## 2014-01-12 DIAGNOSIS — N912 Amenorrhea, unspecified: Secondary | ICD-10-CM

## 2014-01-12 DIAGNOSIS — Z202 Contact with and (suspected) exposure to infections with a predominantly sexual mode of transmission: Secondary | ICD-10-CM

## 2014-01-12 DIAGNOSIS — T402X5A Adverse effect of other opioids, initial encounter: Secondary | ICD-10-CM | POA: Insufficient documentation

## 2014-01-12 DIAGNOSIS — K5909 Other constipation: Secondary | ICD-10-CM

## 2014-01-12 DIAGNOSIS — N760 Acute vaginitis: Secondary | ICD-10-CM

## 2014-01-12 DIAGNOSIS — F319 Bipolar disorder, unspecified: Secondary | ICD-10-CM | POA: Insufficient documentation

## 2014-01-12 DIAGNOSIS — Z113 Encounter for screening for infections with a predominantly sexual mode of transmission: Secondary | ICD-10-CM | POA: Diagnosis not present

## 2014-01-12 DIAGNOSIS — R300 Dysuria: Secondary | ICD-10-CM

## 2014-01-12 DIAGNOSIS — K5903 Drug induced constipation: Secondary | ICD-10-CM | POA: Insufficient documentation

## 2014-01-12 DIAGNOSIS — G8929 Other chronic pain: Secondary | ICD-10-CM

## 2014-01-12 DIAGNOSIS — N949 Unspecified condition associated with female genital organs and menstrual cycle: Secondary | ICD-10-CM

## 2014-01-12 DIAGNOSIS — K5900 Constipation, unspecified: Secondary | ICD-10-CM | POA: Insufficient documentation

## 2014-01-12 DIAGNOSIS — F3160 Bipolar disorder, current episode mixed, unspecified: Secondary | ICD-10-CM | POA: Insufficient documentation

## 2014-01-12 LAB — POCT URINALYSIS DIPSTICK
Bilirubin, UA: NEGATIVE
GLUCOSE UA: NEGATIVE
Ketones, UA: NEGATIVE
LEUKOCYTES UA: NEGATIVE
Nitrite, UA: NEGATIVE
Protein, UA: NEGATIVE
RBC UA: NEGATIVE
Spec Grav, UA: 1.02
UROBILINOGEN UA: 0.2
pH, UA: 7

## 2014-01-12 LAB — POCT GLYCOSYLATED HEMOGLOBIN (HGB A1C): Hemoglobin A1C: 5

## 2014-01-12 LAB — POCT URINE PREGNANCY: Preg Test, Ur: NEGATIVE

## 2014-01-12 MED ORDER — POLYETHYLENE GLYCOL 3350 17 GM/SCOOP PO POWD: 17.0000 g | Freq: Two times a day (BID) | ORAL | Status: DC | PRN

## 2014-01-12 NOTE — Patient Instructions (Signed)
Pelvic Pain Female pelvic pain can be caused by many different things and start from a variety of places. Pelvic pain refers to pain that is located in the lower half of the abdomen and between your hips. The pain may occur over a short period of time (acute) or may be reoccurring (chronic). The cause of pelvic pain may be related to disorders affecting the female reproductive organs (gynecologic), but it may also be related to the bladder, kidney stones, an intestinal complication, or muscle or skeletal problems. Getting help right away for pelvic pain is important, especially if there has been severe, sharp, or a sudden onset of unusual pain. It is also important to get help right away because some types of pelvic pain can be life threatening.  CAUSES  Below are only some of the causes of pelvic pain. The causes of pelvic pain can be in one of several categories.   Gynecologic.  Pelvic inflammatory disease.  Sexually transmitted infection.  Ovarian cyst or a twisted ovarian ligament (ovarian torsion).  Uterine lining that grows outside the uterus (endometriosis).  Fibroids, cysts, or tumors.  Ovulation.  Pregnancy.  Pregnancy that occurs outside the uterus (ectopic pregnancy).  Miscarriage.  Labor.  Abruption of the placenta or ruptured uterus.  Infection.  Uterine infection (endometritis).  Bladder infection.  Diverticulitis.  Miscarriage related to a uterine infection (septic abortion).  Bladder.  Inflammation of the bladder (cystitis).  Kidney stone(s).  Gastrointestinal.  Constipation.  Diverticulitis.  Neurologic.  Trauma.  Feeling pelvic pain because of mental or emotional causes (psychosomatic).  Cancers of the bowel or pelvis. EVALUATION  Your caregiver will want to take a careful history of your concerns. This includes recent changes in your health, a careful gynecologic history of your periods (menses), and a sexual history. Obtaining your family  history and medical history is also important. Your caregiver may suggest a pelvic exam. A pelvic exam will help identify the location and severity of the pain. It also helps in the evaluation of which organ system may be involved. In order to identify the cause of the pelvic pain and be properly treated, your caregiver may order tests. These tests may include:   A pregnancy test.  Pelvic ultrasonography.  An X-ray exam of the abdomen.  A urinalysis or evaluation of vaginal discharge.  Blood tests. HOME CARE INSTRUCTIONS   Only take over-the-counter or prescription medicines for pain, discomfort, or fever as directed by your caregiver.   Rest as directed by your caregiver.   Eat a balanced diet.   Drink enough fluids to make your urine clear or pale yellow, or as directed.   Avoid sexual intercourse if it causes pain.   Apply warm or cold compresses to the lower abdomen depending on which one helps the pain.   Avoid stressful situations.   Keep a journal of your pelvic pain. Write down when it started, where the pain is located, and if there are things that seem to be associated with the pain, such as food or your menstrual cycle.  Follow up with your caregiver as directed.  SEEK MEDICAL CARE IF:  Your medicine does not help your pain.  You have abnormal vaginal discharge. SEEK IMMEDIATE MEDICAL CARE IF:   You have heavy bleeding from the vagina.   Your pelvic pain increases.   You feel light-headed or faint.   You have chills.   You have pain with urination or blood in your urine.   You have uncontrolled diarrhea   or vomiting.   You have a fever or persistent symptoms for more than 3 days.  You have a fever and your symptoms suddenly get worse.   You are being physically or sexually abused.  MAKE SURE YOU:  Understand these instructions.  Will watch your condition.  Will get help if you are not doing well or get worse. Document Released:  01/16/2004 Document Revised: 07/05/2013 Document Reviewed: 06/10/2011 Exit[employer] Patient Information 2015 [employer]. This information is not intended to replace advice given to you by your health care provider. Make sure you discuss any questions you have with your health care provider.  

## 2014-01-12 NOTE — Assessment & Plan Note (Signed)
Patient has concerns for exposure to sexual transmitted diseases per her partner. - She declines a vaginal exam today. Discussed this in detail with the patient and she states that if she has any other symptoms she'll make an appointment for her vaginal exam. - HIV, hepatitis C, gonorrhea and Chlamydia urine cytology We will call patient with the results
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
## 2014-01-13 ENCOUNTER — Telehealth: Payer: Self-pay | Admitting: *Deleted

## 2014-01-13 ENCOUNTER — Telehealth: Payer: Self-pay | Admitting: Family Medicine

## 2014-01-13 ENCOUNTER — Other Ambulatory Visit: Payer: Medicaid Other

## 2014-01-13 DIAGNOSIS — Z1159 Encounter for screening for other viral diseases: Secondary | ICD-10-CM

## 2014-01-13 LAB — HEPATITIS PANEL, ACUTE
HCV AB: NEGATIVE
HEP A IGM: NONREACTIVE
HEP B S AG: NEGATIVE
Hep B C IgM: NONREACTIVE

## 2014-01-13 LAB — HEPATITIS C ANTIBODY: HCV Ab: NEGATIVE

## 2014-01-13 LAB — URINE CYTOLOGY ANCILLARY ONLY
Chlamydia: NEGATIVE
Neisseria Gonorrhea: NEGATIVE

## 2014-01-13 LAB — HIV ANTIBODY (ROUTINE TESTING W REFLEX): HIV 1&2 Ab, 4th Generation: NONREACTIVE

## 2014-01-13 NOTE — Telephone Encounter (Signed)
Spoke with patient and informed her of below, she will be coming in today @ 2:30pm for the lab draw

## 2014-01-13 NOTE — Assessment & Plan Note (Addendum)
Pt has a chronic history of pelvic pain. She does suffer from PCOS and likely IBS. Pt was finding some comfort with start of BCP, but decided to stop them because of "heavy" bleeding with them. She has been suffering from chronic diarrhea with negative stool studies, therefore Glucophage was not a consideration at the time.  - Could consider Glucophage use. Pt likely has IBS and this may worsen those sx.  - Will place referral for gynecology, pt has a referral for gastro active as well.  She is a difficult case, IBS and PCOS, complicated by mood disorder (likely bipolar).

## 2014-01-13 NOTE — Telephone Encounter (Signed)
Please call Mackenzie Gibson, her hepatitis panel and HIV are negative. I want to check hep C pecifically since she has 2 family contacts that are hep  positive. Please have her come in to have that drawn specifically. That order has been placed. Thanks.

## 2014-01-13 NOTE — Telephone Encounter (Signed)
LVM for patient to call back, she does not need to come in today for the lab draw it will be added on to yesterdays draw. Please notify patient if she calls back.

## 2014-01-13 NOTE — Telephone Encounter (Signed)
Spoke with patient and informed her of below 

## 2014-01-14 ENCOUNTER — Encounter: Payer: Self-pay | Admitting: Family Medicine

## 2014-01-14 ENCOUNTER — Encounter: Payer: Self-pay | Admitting: Gastroenterology

## 2014-01-14 ENCOUNTER — Telehealth: Payer: Self-pay | Admitting: Family Medicine

## 2014-01-14 NOTE — Telephone Encounter (Signed)
Please call [name], her Hep C was negative. Thanks

## 2014-01-14 NOTE — Assessment & Plan Note (Signed)
Pt has not been the least bit interested in discussing her mood disorder in the past. She has consistently refused help, referrals or medications surrounding this disorder. Pt has pressured speech at every appointment I have had with her and manic episodes. She frequently obsesses over different diseases and suffers from somatic disorder. She has finally had some insight into her disorder with the loss of her job recently, and is asking for a referral to psychiatry. She is uncertain at this time if she will take medications, but she wants to discuss options.  - referral to psychiatry - [facility] also given.  - f/u as needed

## 2014-01-14 NOTE — Assessment & Plan Note (Signed)
Urine negative for infection and cytology negative for Gonorrhea or Chlamydia.  F/u as needed

## 2014-01-14 NOTE — Assessment & Plan Note (Signed)
Pt with likely IBS, she fluctuates bewtween diarrhea and constipation.  Miralax prescribed. Pt has had prior referral to gastro concerning pain and IBS, she states it has not been set up as of yet.  Will place another referral for her today.  F/U as needed

## 2014-01-14 NOTE — Telephone Encounter (Signed)
Spoke with patient and informed her of below results 

## 2014-01-14 NOTE — Assessment & Plan Note (Signed)
Pt with PCOS and morbid obesity.  Pt has a believe that she is insulin resistant and obsesses over getting a1c completed.   A1c drawn today.

## 2014-01-14 NOTE — Telephone Encounter (Signed)
Your chlamydia and gonorrhea urine cytology is negative.Thanks

## 2014-01-21 ENCOUNTER — Telehealth: Payer: Self-pay | Admitting: *Deleted

## 2014-01-21 ENCOUNTER — Ambulatory Visit: Payer: Medicaid Other | Admitting: Gastroenterology

## 2014-01-21 NOTE — Telephone Encounter (Signed)
Called patient about missed appointment  [facility] for call back Will send letter

## 2014-02-03 ENCOUNTER — Encounter: Payer: Self-pay | Admitting: Family Medicine

## 2014-02-03 ENCOUNTER — Ambulatory Visit (INDEPENDENT_AMBULATORY_CARE_PROVIDER_SITE_OTHER): Payer: Medicaid Other | Admitting: Family Medicine

## 2014-02-03 VITALS — BP 138/72 | HR 82 | Temp 97.7°F | Ht 67.0 in | Wt 249.3 lb

## 2014-02-03 DIAGNOSIS — K5909 Other constipation: Secondary | ICD-10-CM

## 2014-02-03 DIAGNOSIS — N912 Amenorrhea, unspecified: Secondary | ICD-10-CM

## 2014-02-03 LAB — POCT URINE PREGNANCY: PREG TEST UR: NEGATIVE

## 2014-02-03 MED ORDER — POLYETHYLENE GLYCOL 3350 17 GM/SCOOP PO POWD: 17.0000 g | Freq: Two times a day (BID) | ORAL | Status: DC | PRN

## 2014-02-03 NOTE — Progress Notes (Signed)
   Subjective:    Patient ID: Mackenzie Gibson, female    DOB: 07/20/1985, 28 y.o.   MRN: [id number]  HPI  Amenorrhea: Patient's last menstrual period was 12/25/2013 (exact date). She has a history of PCOS. She had a negative urine pregnancy test on October 19 and  Nov.11, 2015. She has an appointment with Dr. Roselie Awkward in 4 days for her frequent pelvic pain and PCOS.    Current everyday smoker Past Medical History  Diagnosis Date  . PCOS (polycystic ovarian syndrome)   . Depression    Allergies  Allergen Reactions  . Hydrocodone Other (See Comments)     Tongue swelling    Review of Systems Per history of present illness    Objective:   Physical Exam BP 138/72 mmHg  Pulse 82  Temp(Src) 97.7 F (36.5 C) (Oral)  Ht 5\' 7"  (1.702 m)  Wt 249 lb 4.8 oz (113.082 kg)  BMI 39.04 kg/m2  LMP 12/25/2013 (Exact Date) Gen: Caucasian female, no acute distress, nontoxic in appearance, well-developed, well-nourished, obese HEENT: AT. [place].  Bilateral eyes without injections or icterus. MMM. Abd: Soft, round, bowel sounds present, no masses, nondistended, mild left lower quadrant pain. Psych: Anxious, pressured speech.     Assessment & Plan:

## 2014-02-03 NOTE — Assessment & Plan Note (Signed)
Pregnancy test today negative. Patient is to follow-up with gynecological appointment in a few days. If she continues to not have a menstrual cycle, she may need Provera challenge, patient is not very willing to think about this today and states she doesn't like To take back makes her hormones go crazy.

## 2014-02-03 NOTE — Patient Instructions (Signed)
Your pregnancy test is negative today. Please keep your appointment with Dr. Roselie Awkward, as he will be able to get to the bottom of your chronic pelvic pain

## 2014-02-04 ENCOUNTER — Telehealth: Payer: Self-pay | Admitting: *Deleted

## 2014-02-04 ENCOUNTER — Other Ambulatory Visit: Payer: Self-pay | Admitting: Family Medicine

## 2014-02-04 DIAGNOSIS — K5909 Other constipation: Secondary | ICD-10-CM

## 2014-02-04 MED ORDER — POLYETHYLENE GLYCOL 3350 17 GM/SCOOP PO POWD: 17.0000 g | Freq: Two times a day (BID) | ORAL | Status: DC | PRN

## 2014-02-04 NOTE — Telephone Encounter (Signed)
Received a fax from [facility] needing clarification on the quantity for Miralax.  Quantity 3350 gram ordered.  Please give [facility] a call at [phone]. Derl Barrow, RN

## 2014-02-07 ENCOUNTER — Encounter: Payer: Self-pay | Admitting: Obstetrics & Gynecology

## 2014-02-07 ENCOUNTER — Ambulatory Visit (INDEPENDENT_AMBULATORY_CARE_PROVIDER_SITE_OTHER): Payer: Medicaid Other | Admitting: Obstetrics & Gynecology

## 2014-02-07 ENCOUNTER — Other Ambulatory Visit: Payer: Self-pay | Admitting: Obstetrics & Gynecology

## 2014-02-07 VITALS — BP 142/103 | HR 84 | Resp 16 | Ht 67.0 in | Wt 252.0 lb

## 2014-02-07 DIAGNOSIS — N925 Other specified irregular menstruation: Secondary | ICD-10-CM

## 2014-02-07 DIAGNOSIS — E8881 Metabolic syndrome: Secondary | ICD-10-CM

## 2014-02-07 DIAGNOSIS — Z8742 Personal history of other diseases of the female genital tract: Secondary | ICD-10-CM

## 2014-02-07 DIAGNOSIS — E282 Polycystic ovarian syndrome: Secondary | ICD-10-CM

## 2014-02-07 MED ORDER — DROSPIRENONE-ETHINYL ESTRADIOL 3-0.03 MG PO TABS: 1.0000 | ORAL_TABLET | Freq: Every day | ORAL | Status: DC

## 2014-02-07 MED ORDER — METFORMIN HCL 500 MG PO TABS: 250.0000 mg | ORAL_TABLET | Freq: Two times a day (BID) | ORAL | Status: DC

## 2014-02-07 NOTE — Patient Instructions (Signed)
Polycystic Ovarian Syndrome Polycystic ovarian syndrome (PCOS) is a common hormonal disorder among women of reproductive age. Most women with PCOS grow many small cysts on their ovaries. PCOS can cause problems with your periods and make it difficult to get pregnant. It can also cause an increased risk of miscarriage with pregnancy. If left untreated, PCOS can lead to serious health problems, such as diabetes and heart disease. CAUSES The cause of PCOS is not fully understood, but genetics may be a factor. SIGNS AND SYMPTOMS   Infrequent or no menstrual periods.   Inability to get pregnant (infertility) because of not ovulating.   Increased growth of hair on the face, chest, stomach, back, thumbs, thighs, or toes.   Acne, oily skin, or dandruff.   Pelvic pain.   Weight gain or obesity, usually carrying extra weight around the waist.   Type 2 diabetes.   High cholesterol.   High blood pressure.   Female-pattern baldness or thinning hair.   Patches of thickened and dark brown or black skin on the neck, arms, breasts, or thighs.   Tiny excess flaps of skin (skin tags) in the armpits or neck area.   Excessive snoring and having breathing stop at times while asleep (sleep apnea).   Deepening of the voice.   Gestational diabetes when pregnant.  DIAGNOSIS  There is no single test to diagnose PCOS.   Your health care provider will:   Take a medical history.   Perform a pelvic exam.   Have ultrasonography done.   Check your female and female hormone levels.   Measure glucose or sugar levels in the blood.   Do other blood tests.   If you are producing too many female hormones, your health care provider will make sure it is from PCOS. At the physical exam, your health care provider will want to evaluate the areas of increased hair growth. Try to allow natural hair growth for a few days before the visit.   During a pelvic exam, the ovaries may be enlarged  or swollen because of the increased number of small cysts. This can be seen more easily by using vaginal ultrasonography or screening to examine the ovaries and lining of the uterus (endometrium) for cysts. The uterine lining may become thicker if you have not been having a regular period.  TREATMENT  Because there is no cure for PCOS, it needs to be managed to prevent problems. Treatments are based on your symptoms. Treatment is also based on whether you want to have a baby or whether you need contraception.  Treatment may include:   Progesterone hormone to start a menstrual period.   Birth control pills to make you have regular menstrual periods.   Medicines to make you ovulate, if you want to get pregnant.   Medicines to control your insulin.   Medicine to control your blood pressure.   Medicine and diet to control your high cholesterol and triglycerides in your blood.  Medicine to reduce excessive hair growth.  Surgery, making small holes in the ovary, to decrease the amount of female hormone production. This is done through a long, lighted tube (laparoscope) placed into the pelvis through a tiny incision in the lower abdomen.  HOME CARE INSTRUCTIONS  Only take over-the-counter or prescription medicine as directed by your health care provider.  Pay attention to the foods you eat and your activity levels. This can help reduce the effects of PCOS.  Keep your weight under control.  Eat foods that are   take over-the-counter or prescription medicine as directed by your health care provider.   Pay attention to the foods you eat and your activity levels. This can help reduce the effects of PCOS.   Keep your weight under control.   Eat foods that are low in carbohydrate and high in fiber.   Exercise regularly.  SEEK MEDICAL CARE IF:   Your symptoms do not get better with medicine.   You have new symptoms.  Document Released: 06/14/2004 Document Revised: 12/09/2012 Document Reviewed: 08/06/2012  [employer] Patient Information 2015 [employer]. This information is not intended to replace advice given to you by your health care provider. Make sure you discuss any questions you have with your health care provider.

## 2014-02-07 NOTE — Progress Notes (Signed)
Patient ID: Mackenzie Gibson, female   DOB: [birth year], 28 y.o.   MRN: [id number]  Chief Complaint  Patient presents with  . Gynecologic Exam    Pt c/o PCOS, pelvic pain, weight gain, acne    HPI Mackenzie Gibson is a 28 y.o. female.  [id number] Patient's last menstrual period was 12/25/2013 (exact date). H/O PCOS and took [place] until 10/2013 which she stopped due to side effects. Menses irregular sine then.   HPI  Past Medical History  Diagnosis Date  . PCOS (polycystic ovarian syndrome)   . Depression     Past Surgical History  Procedure Laterality Date  . Cholecystectomy    . Eye surgery      Family History  Problem Relation Age of Onset  . Depression Mother   . Depression Father     Social History History  Substance Use Topics  . Smoking status: Current Every Day Smoker -- 0.30 packs/day    Types: Cigarettes  . Smokeless tobacco: Not on file  . Alcohol Use: No    Allergies  Allergen Reactions  . Hydrocodone Other (See Comments)     Tongue swelling    Current Outpatient Prescriptions  Medication Sig Dispense Refill  . albuterol (PROVENTIL HFA;VENTOLIN HFA) 108 (90 BASE) MCG/ACT inhaler Inhale 2 puffs into the lungs every 6 (six) hours as needed for wheezing or shortness of breath. 1 Inhaler 0  . ibuprofen (ADVIL,MOTRIN) 600 MG tablet Take 1 tablet (600 mg total) by mouth every 6 (six) hours as needed. 30 tablet 0  . polyethylene glycol powder (GLYCOLAX/MIRALAX) powder Take 17 g by mouth 2 (two) times daily as needed. 850 g 1  . triamcinolone ointment (KENALOG) 0.5 % Apply 1 application topically 2 (two) times daily. 30 g 0  . drospirenone-ethinyl estradiol (YASMIN,ZARAH,SYEDA) 3-0.03 MG tablet Take 1 tablet by mouth daily. 1 Package 11  . metFORMIN (GLUCOPHAGE) 500 MG tablet Take 0.5 tablets (250 mg total) by mouth 2 (two) times daily with a meal. 30 tablet 6   No current facility-administered medications for this visit.    Review of Systems Review of  Systems  Constitutional: Positive for unexpected weight change.  Respiratory: Negative.   Genitourinary: Positive for menstrual problem and pelvic pain. Negative for vaginal discharge.    Blood pressure 142/103, pulse 84, resp. rate 16, height 5\' 7"  (1.702 m), weight 252 lb (114.306 kg), last menstrual period 12/25/2013.  Physical Exam Physical Exam  Constitutional: She is oriented to person, place, and time. She appears well-developed. No distress.  obese  Pulmonary/Chest: Effort normal. No respiratory distress.  Neurological: She is alert and oriented to person, place, and time.  Skin: Skin is warm and dry.  Psychiatric:  Somewhat pressured speech, anxious    Data Reviewed  CLINICAL DATA: Left lower quadrant pain, dyspareunia  EXAM: TRANSABDOMINAL AND TRANSVAGINAL ULTRASOUND OF PELVIS  TECHNIQUE: Both transabdominal and transvaginal ultrasound examinations of the pelvis were performed. Transabdominal technique was performed for global imaging of the pelvis including uterus, ovaries, adnexal regions, and pelvic cul-de-sac. It was necessary to proceed with endovaginal exam following the transabdominal exam to visualize the uterus and bilateral ovaries.  COMPARISON: CT abdomen pelvis dated 06/16/2008  FINDINGS: Uterus  Measurements: 7.7 x 4.6 x 6.1 cm. No fibroids or other mass visualized.  Endometrium  Thickness: 8 mm. No focal abnormality visualized.  Right ovary  Measurements: 5.2 x 2.3 x 1.9 cm. Multiple peripheral immature follicles.  Left ovary  Measurements: 3.9 x 1.7 x 2.3 cm.  Multiple peripheral immature follicles. Two subcentimeter hyperechoic lesions likely reflect dystrophic calcifications when correlating with prior CT.  Other findings  No free fluid.  IMPRESSION: Enlarged ovaries with multiple peripheral immature follicles, nonspecific. In the appropriate clinical setting, this would be compatible with the clinical  diagnosis of PCOS.  Otherwise negative pelvic US.   Electronically Signed  By: Julian Hy M.D.  On: 06/24/2013 14:48      Assessment    PCOS, irregular menses, H/O insulin resistance.     Plan    Restart OCP. Rx Yasmin. Metformin 250 mg BID. Repeat US at her request due to pelvic pain 1 mo        Owenn Rothermel 02/07/2014, 10:28 AM

## 2014-02-09 ENCOUNTER — Ambulatory Visit (HOSPITAL_COMMUNITY)
Admission: RE | Admit: 2014-02-09 | Discharge: 2014-02-09 | Disposition: A | Discharge status: Home / Self Care | Payer: Medicaid Other | Source: Ambulatory Visit | Attending: Obstetrics & Gynecology | Admitting: Obstetrics & Gynecology

## 2014-02-09 DIAGNOSIS — R635 Abnormal weight gain: Secondary | ICD-10-CM | POA: Insufficient documentation

## 2014-02-09 DIAGNOSIS — R1031 Right lower quadrant pain: Secondary | ICD-10-CM | POA: Diagnosis present

## 2014-02-09 DIAGNOSIS — Z8742 Personal history of other diseases of the female genital tract: Secondary | ICD-10-CM

## 2014-02-09 DIAGNOSIS — M545 Low back pain: Secondary | ICD-10-CM | POA: Insufficient documentation

## 2014-02-14 ENCOUNTER — Ambulatory Visit (INDEPENDENT_AMBULATORY_CARE_PROVIDER_SITE_OTHER): Payer: Medicaid Other | Admitting: Obstetrics & Gynecology

## 2014-02-14 ENCOUNTER — Encounter: Payer: Self-pay | Admitting: Obstetrics & Gynecology

## 2014-02-14 VITALS — BP 131/87 | HR 74 | Resp 16 | Wt 252.0 lb

## 2014-02-14 DIAGNOSIS — E282 Polycystic ovarian syndrome: Secondary | ICD-10-CM

## 2014-02-14 MED ORDER — NORGESTIMATE-ETH ESTRADIOL 0.25-35 MG-MCG PO TABS: 1.0000 | ORAL_TABLET | Freq: Every day | ORAL | Status: DC

## 2014-02-14 NOTE — Progress Notes (Signed)
Patient ID: Mackenzie Gibson, female   DOB: [birth year], 28 y.o.   MRN: [id number] Patient's last menstrual period was 02/14/2014. Did not start Yasmin, wants to try Sprintec again. [name] reviewed.  CLINICAL DATA: Right lower quadrant pain for 2 months. Low back pain. 15 lb weight gain.  EXAM: TRANSABDOMINAL AND TRANSVAGINAL ULTRASOUND OF PELVIS  TECHNIQUE: Both transabdominal and transvaginal ultrasound examinations of the pelvis were performed. Transabdominal technique was performed for global imaging of the pelvis including uterus, ovaries, adnexal regions, and pelvic cul-de-sac. It was necessary to proceed with endovaginal exam following the transabdominal exam to visualize the endometrium and ovaries.  COMPARISON: 06/24/2013  FINDINGS: Uterus  Measurements: 8.9 x 4.7 x 6.0 cm. No fibroids or other mass visualized.  Endometrium  Thickness: 11.5 mm. No focal abnormality visualized.  Right ovary  Measurements: 5.3 x 2.3 x 3.3 cm. The volume is equal to 20 cc. No adnexal mass.  Left ovary  Measurements: 3.4 x 2.2 x 2.6 cm. The volume is equal to 10 cc. Several small echogenic structures are noted measuring up to 8 mm. No adnexal mass.  Other findings  No free fluid.  IMPRESSION: 1. No acute findings and no explanation for patient's weight gain and pain. 2. The right ovary has a volume of 20 cc, which is consistent with the history of polycystic ovarian syndrome.   Electronically Signed  By: Kerby Moors M.D.  On: 02/09/2014 13:54   I answered her questions about PCOS and will f/u in 4 mo. With current Rx and change to Sprintec  Woodroe Mode, MD 02/14/2014

## 2014-02-14 NOTE — Patient Instructions (Signed)
Polycystic Ovarian Syndrome Polycystic ovarian syndrome (PCOS) is a common hormonal disorder among women of reproductive age. Most women with PCOS grow many small cysts on their ovaries. PCOS can cause problems with your periods and make it difficult to get pregnant. It can also cause an increased risk of miscarriage with pregnancy. If left untreated, PCOS can lead to serious health problems, such as diabetes and heart disease. CAUSES The cause of PCOS is not fully understood, but genetics may be a factor. SIGNS AND SYMPTOMS   Infrequent or no menstrual periods.   Inability to get pregnant (infertility) because of not ovulating.   Increased growth of hair on the face, chest, stomach, back, thumbs, thighs, or toes.   Acne, oily skin, or dandruff.   Pelvic pain.   Weight gain or obesity, usually carrying extra weight around the waist.   Type 2 diabetes.   High cholesterol.   High blood pressure.   Female-pattern baldness or thinning hair.   Patches of thickened and dark brown or black skin on the neck, arms, breasts, or thighs.   Tiny excess flaps of skin (skin tags) in the armpits or neck area.   Excessive snoring and having breathing stop at times while asleep (sleep apnea).   Deepening of the voice.   Gestational diabetes when pregnant.  DIAGNOSIS  There is no single test to diagnose PCOS.   Your health care provider will:   Take a medical history.   Perform a pelvic exam.   Have ultrasonography done.   Check your female and female hormone levels.   Measure glucose or sugar levels in the blood.   Do other blood tests.   If you are producing too many female hormones, your health care provider will make sure it is from PCOS. At the physical exam, your health care provider will want to evaluate the areas of increased hair growth. Try to allow natural hair growth for a few days before the visit.   During a pelvic exam, the ovaries may be enlarged  or swollen because of the increased number of small cysts. This can be seen more easily by using vaginal ultrasonography or screening to examine the ovaries and lining of the uterus (endometrium) for cysts. The uterine lining may become thicker if you have not been having a regular period.  TREATMENT  Because there is no cure for PCOS, it needs to be managed to prevent problems. Treatments are based on your symptoms. Treatment is also based on whether you want to have a baby or whether you need contraception.  Treatment may include:   Progesterone hormone to start a menstrual period.   Birth control pills to make you have regular menstrual periods.   Medicines to make you ovulate, if you want to get pregnant.   Medicines to control your insulin.   Medicine to control your blood pressure.   Medicine and diet to control your high cholesterol and triglycerides in your blood.  Medicine to reduce excessive hair growth.  Surgery, making small holes in the ovary, to decrease the amount of female hormone production. This is done through a long, lighted tube (laparoscope) placed into the pelvis through a tiny incision in the lower abdomen.  HOME CARE INSTRUCTIONS  Only take over-the-counter or prescription medicine as directed by your health care provider.  Pay attention to the foods you eat and your activity levels. This can help reduce the effects of PCOS.  Keep your weight under control.  Eat foods that are   take over-the-counter or prescription medicine as directed by your health care provider.   Pay attention to the foods you eat and your activity levels. This can help reduce the effects of PCOS.   Keep your weight under control.   Eat foods that are low in carbohydrate and high in fiber.   Exercise regularly.  SEEK MEDICAL CARE IF:   Your symptoms do not get better with medicine.   You have new symptoms.  Document Released: 06/14/2004 Document Revised: 12/09/2012 Document Reviewed: 08/06/2012  [employer] Patient Information 2015 [employer]. This information is not intended to replace advice given to you by your health care provider. Make sure you discuss any questions you have with your health care provider.

## 2014-03-04 NOTE — L&D Delivery Note (Signed)
Patient is 29 y.o. [id number] [redacted]w[redacted]d admitted for IOL due to [facility]. Progressed well through labor. Felt the urge to push.    Delivery Note At 9:45 AM a viable female was delivered via Vaginal, Spontaneous Delivery (Presentation: Right Occiput Anterior).  APGAR: 9, 9; weight 8 lb 3.2 oz (3720 g).   Placenta status: Intact, Spontaneous.  Cord: 3 vessels with the following complications: None.  Cord pH: not collected  Anesthesia: Epidural  Episiotomy: None Lacerations: None Suture Repair: n/a Est. Blood Loss (mL): 150  Mom to postpartum.  Baby to Couplet care / Skin to Skin.  Juanita Craver [facility] 03/03/2015, 12:40 PM

## 2014-03-21 ENCOUNTER — Ambulatory Visit: Payer: Medicaid Other | Admitting: Family Medicine

## 2014-06-08 ENCOUNTER — Encounter: Payer: Self-pay | Admitting: Family Medicine

## 2014-06-08 ENCOUNTER — Ambulatory Visit (INDEPENDENT_AMBULATORY_CARE_PROVIDER_SITE_OTHER): Payer: Medicaid Other | Admitting: Family Medicine

## 2014-06-08 VITALS — BP 137/87 | HR 78 | Temp 97.8°F | Wt 262.0 lb

## 2014-06-08 DIAGNOSIS — J069 Acute upper respiratory infection, unspecified: Secondary | ICD-10-CM | POA: Diagnosis present

## 2014-06-08 NOTE — Progress Notes (Signed)
One of the preceptor of the day.

## 2014-06-08 NOTE — Progress Notes (Signed)
   Subjective:    Patient ID: Mackenzie Gibson, female    DOB: 23-Feb-1986, 29 y.o.   MRN: [id number]  Seen for Same day visit for   CC: sore throat  URI  Reports sore throat, cough 3 days.  Reports feels similar to previous bronchitis infections.  She is a current smoker, has cut down on cigarettes to 3-4 daily. Cough is nonproductive.   Has been sick for 3 days. Nasal discharge: yes Medications tried: tylenol Sick contacts: no  Symptoms Fever: no Headache or face pain: no Tooth pain: no Sneezing: no Scratchy throat: yes Allergies: no Muscle aches: no Severe fatigue Stiff neck: no Shortness of breath: no Rash: no Sore throat or swollen glands: no  ROS see HPI Smoking Status noted  Objective:  BP 137/87 mmHg  Pulse 78  Temp(Src) 97.8 F (36.6 C) (Oral)  Wt 262 lb (118.842 kg)  LMP 05/24/2014 (Approximate)  General: NAD HEENT: Pharyngeal erythema Cardiac: RRR, normal heart sounds, no murmurs.  Respiratory: Mild expiratory wheeze; normal effort Extremities: no edema or cyanosis. WWP. Skin: warm and dry, no rashes noted    Assessment & Plan:  See Problem List Documentation

## 2014-06-08 NOTE — Assessment & Plan Note (Signed)
-   No signs of PNA. VSS; Lung exam = mild wheeze.  Possible bronchitis - see AVS for symptom management - Consider steroids - patient refused "makes me angry" - Advised Albuterol - Patient refused " make my anxiety bad" - Advised smoking cessation - f/u with PCP if symptoms not improving in 3-4 days or develops chest pain, fever > 102 for several days

## 2014-06-08 NOTE — Patient Instructions (Signed)
Your symptoms are due to a viral illness. Antibiotics will not help improve your symptoms, but the following will help you feel better while your body fights the virus.   Drink lots of water; Warm liquids and honey  Nasal Saline Spray  Congestion:   Nose spray: Afrin (Phenylephrine). DO NOT USE MORE THAN 3 DAYS  Oral: Pseudoephedrine  Pain/Sore throat: Tylenol (650 mg every 6 hrs) as needed, Ibuprofen (400-600 mg every 6 hours or Aleve 2 pills twice a day  Cough: Cough drops  Wash your hands often to prevent spreading the virus

## 2014-06-22 ENCOUNTER — Ambulatory Visit: Payer: Medicaid Other | Admitting: Family Medicine

## 2014-06-27 ENCOUNTER — Encounter: Payer: Self-pay | Admitting: Family Medicine

## 2014-06-27 ENCOUNTER — Ambulatory Visit (INDEPENDENT_AMBULATORY_CARE_PROVIDER_SITE_OTHER): Payer: Medicaid Other | Admitting: Family Medicine

## 2014-06-27 VITALS — BP 134/70 | HR 97 | Temp 98.1°F | Wt 261.0 lb

## 2014-06-27 DIAGNOSIS — Z331 Pregnant state, incidental: Secondary | ICD-10-CM

## 2014-06-27 DIAGNOSIS — Z3200 Encounter for pregnancy test, result unknown: Secondary | ICD-10-CM

## 2014-06-27 DIAGNOSIS — J069 Acute upper respiratory infection, unspecified: Secondary | ICD-10-CM | POA: Diagnosis not present

## 2014-06-27 DIAGNOSIS — R059 Cough, unspecified: Secondary | ICD-10-CM

## 2014-06-27 DIAGNOSIS — R05 Cough: Secondary | ICD-10-CM | POA: Diagnosis not present

## 2014-06-27 DIAGNOSIS — Z3490 Encounter for supervision of normal pregnancy, unspecified, unspecified trimester: Secondary | ICD-10-CM

## 2014-06-27 DIAGNOSIS — Z3201 Encounter for pregnancy test, result positive: Secondary | ICD-10-CM | POA: Diagnosis not present

## 2014-06-27 LAB — POCT URINE PREGNANCY: Preg Test, Ur: POSITIVE

## 2014-06-27 NOTE — Progress Notes (Signed)
Patient ID: Mackenzie Gibson, female   DOB: 1985/04/22, 29 y.o.   MRN: [id number]  Tommi Rumps, MD Phone: [phone]  Mackenzie Gibson is a 29 y.o. female who presents today for same day appointment.  Positive home pregnancy test: notes she is 3-4 days late for her period. LMP was some time between March 22-24. Came off OCPs in February. Was having regular periods monthly on OCPs. Notes associated nausea and sore breasts.  Also notes she has had a virus for the past month. Notes cough, wheezing, congestion, and low grade fevers since being seen earlier this month. Reports she went to [facility] last Tuesday or Wednesday and was told she had bronchitis. Had a CXR that she reports was negative. She was prescribed azithromycin, though did not start this as she thought she might be pregnant even though she reports the upreg was negative at [facility]. She has been around her moms boyfriend who was sick and had some type of lung infection that he had to get 4 antibiotics for. Nursing contacted [facility] (boyfriend lives in [place]) to see if the moms boyfriend had been treated for TB given the 4 antibiotic regimen that was reported. The HD stated they had not seen his name Glory Buff) on there list of reported cases of TB.  PMH: smoker.   ROS: Per HPI   Physical Exam Filed Vitals:   06/27/14 1442  BP: 134/70  Pulse: 97  Temp:   O2 sat 97%  Gen: Well NAD HEENT: PERRL,  MMM, normal TMs bilaterally, no cervical LAD, normal OP Lungs: scattered wheezes and coarse breath sounds, Nl WOB Heart: RRR, no murmurs  Exts: Non edematous BL  LE, warm and well perfused.    Assessment/Plan: Please see individual problem list.  Tommi Rumps, MD [place] PGY-3

## 2014-06-27 NOTE — Patient Instructions (Signed)
Nice to see you. You are pregnant. We will refer you to [facility] for further management of your pregnancy.  Given that you may have been exposed to TB recently we are going to check a blood test for this and request the records from [facility] for the chest X-ray results. If you develop worsening cough, fever, shortness of breath, or fatigue please seek medical attention.

## 2014-06-27 NOTE — Assessment & Plan Note (Signed)
Patient with positive pregnancy testing in office. With history of elevated blood pressure over the past several month and history of preeclampsia will refer to high risk clinic at [facility] hospital for management of pregnancy.

## 2014-06-28 NOTE — Assessment & Plan Note (Addendum)
Patient continues to have symptoms after 3 weeks. She was recently diagnosed with bronchitis and was to be treated with azithromycin. Likely related to bronchitis with no focal findings to suggest PNA. I discussed with her that it azithromycin is a category B medication and that we use this in pregnancy, though she declined wanting to taking this medication given that there could be some risk. Will request records from [facility] for CXR findings. Collect quantiferon gold to evaluate for TB given potential exposure, though this is less likely with no apparent report to the HD. Patient declined albuterol and stated she would not take any medications for this illness due to the pregnancy.   Precepted with Dr Andria Frames.

## 2014-06-28 NOTE — Progress Notes (Signed)
I was the preceptor for this visit. 

## 2014-06-29 LAB — QUANTIFERON TB GOLD ASSAY (BLOOD)
Interferon Gamma Release Assay: NEGATIVE
MITOGEN VALUE: 3.98 [IU]/mL
QUANTIFERON NIL VALUE: 0.05 [IU]/mL
QUANTIFERON TB AG MINUS NIL: 0.01 [IU]/mL
TB Ag value: 0.06 IU/mL

## 2014-06-30 ENCOUNTER — Encounter: Payer: Self-pay | Admitting: Family Medicine

## 2014-06-30 ENCOUNTER — Encounter (HOSPITAL_COMMUNITY): Payer: Self-pay | Admitting: *Deleted

## 2014-06-30 ENCOUNTER — Inpatient Hospital Stay (HOSPITAL_COMMUNITY)
Admission: AD | Admit: 2014-06-30 | Discharge: 2014-06-30 | Disposition: A | Discharge status: Home / Self Care | Payer: Medicaid Other | Source: Ambulatory Visit | Attending: Obstetrics & Gynecology | Admitting: Obstetrics & Gynecology

## 2014-06-30 ENCOUNTER — Inpatient Hospital Stay (HOSPITAL_COMMUNITY): Payer: Medicaid Other

## 2014-06-30 DIAGNOSIS — O4691 Antepartum hemorrhage, unspecified, first trimester: Secondary | ICD-10-CM

## 2014-06-30 DIAGNOSIS — N8320 Unspecified ovarian cysts: Secondary | ICD-10-CM | POA: Diagnosis not present

## 2014-06-30 DIAGNOSIS — M545 Low back pain: Secondary | ICD-10-CM | POA: Insufficient documentation

## 2014-06-30 DIAGNOSIS — R109 Unspecified abdominal pain: Secondary | ICD-10-CM | POA: Diagnosis present

## 2014-06-30 DIAGNOSIS — Z3A00 Weeks of gestation of pregnancy not specified: Secondary | ICD-10-CM | POA: Insufficient documentation

## 2014-06-30 DIAGNOSIS — F17210 Nicotine dependence, cigarettes, uncomplicated: Secondary | ICD-10-CM | POA: Insufficient documentation

## 2014-06-30 DIAGNOSIS — O209 Hemorrhage in early pregnancy, unspecified: Secondary | ICD-10-CM | POA: Diagnosis not present

## 2014-06-30 HISTORY — DX: Bipolar disorder, unspecified: F31.9

## 2014-06-30 HISTORY — DX: Anxiety disorder, unspecified: F41.9

## 2014-06-30 LAB — WET PREP, GENITAL
Clue Cells Wet Prep HPF POC: NONE SEEN
TRICH WET PREP: NONE SEEN
Yeast Wet Prep HPF POC: NONE SEEN

## 2014-06-30 LAB — CBC
HCT: 40 % (ref 36.0–46.0)
Hemoglobin: 14.2 g/dL (ref 12.0–15.0)
MCH: 29.6 pg (ref 26.0–34.0)
MCHC: 35.5 g/dL (ref 30.0–36.0)
MCV: 83.5 fL (ref 78.0–100.0)
PLATELETS: 206 10*3/uL (ref 150–400)
RBC: 4.79 MIL/uL (ref 3.87–5.11)
RDW: 12.7 % (ref 11.5–15.5)
WBC: 9.3 10*3/uL (ref 4.0–10.5)

## 2014-06-30 LAB — URINALYSIS, ROUTINE W REFLEX MICROSCOPIC
BILIRUBIN URINE: NEGATIVE
Glucose, UA: NEGATIVE mg/dL
Hgb urine dipstick: NEGATIVE
Ketones, ur: NEGATIVE mg/dL
Leukocytes, UA: NEGATIVE
NITRITE: NEGATIVE
Protein, ur: NEGATIVE mg/dL
Specific Gravity, Urine: >1.03 — ABNORMAL HIGH (ref 1.005–1.030)
UROBILINOGEN UA: 0.2 mg/dL (ref 0.0–1.0)
pH: 6 (ref 5.0–8.0)

## 2014-06-30 LAB — HCG, QUANTITATIVE, PREGNANCY: hCG, Beta Chain, Quant, S: 426 m[IU]/mL — ABNORMAL HIGH (ref <5)

## 2014-06-30 LAB — ABO/RH: ABO/RH(D): A POS

## 2014-06-30 LAB — OB RESULTS CONSOLE GC/CHLAMYDIA: Gonorrhea: NEGATIVE

## 2014-06-30 NOTE — Discharge Instructions (Signed)
Vaginal Bleeding During Pregnancy, First Trimester A small amount of bleeding (spotting) from the vagina is common in early pregnancy. Sometimes the bleeding is normal and is not a problem, and sometimes it is a sign of something serious. Be sure to tell your doctor about any bleeding from your vagina right away. HOME CARE  Watch your condition for any changes.  Follow your doctor's instructions about how active you can be.  If you are on bed rest:  You may need to stay in bed and only get up to use the bathroom.  You may be allowed to do some activities.  If you need help, make plans for someone to help you.  Write down:  The number of pads you use each day.  How often you change pads.  How soaked (saturated) your pads are.  Do not use tampons.  Do not douche.  Do not have sex or orgasms until your doctor says it is okay.  If you pass any tissue from your vagina, save the tissue so you can show it to your doctor.  Only take medicines as told by your doctor.  Do not take aspirin because it can make you bleed.  Keep all follow-up visits as told by your doctor. GET HELP IF:   You bleed from your vagina.  You have cramps.  You have labor pains.  You have a fever that does not go away after you take medicine. GET HELP RIGHT AWAY IF:   You have very bad cramps in your back or belly (abdomen).  You pass large clots or tissue from your vagina.  You bleed more.  You feel light-headed or weak.  You pass out (faint).  You have chills.  You are leaking fluid or have a gush of fluid from your vagina.  You pass out while pooping (having a bowel movement). MAKE SURE YOU:  Understand these instructions.  Will watch your condition.  Will get help right away if you are not doing well or get worse. Document Released: 07/05/2013 Document Reviewed: 10/26/2012 [facility] Patient Information 2015 [place]. This information is not intended to replace advice given  to you by your health care provider. Make sure you discuss any questions you have with your health care provider.

## 2014-06-30 NOTE — MAU Note (Signed)
Does not know gestational age; c/o back pain for 4 days; c/o cramping with spotting for past 2 days;

## 2014-06-30 NOTE — MAU Provider Note (Signed)
History     CSN: [id number]  Arrival date and time: 06/30/14 1505   None     Chief Complaint  Patient presents with  . Back Pain  . Abdominal Cramping  . Vaginal Bleeding   HPI  Pt is G5P3 (SABx1 ? ectopicx1 )with ?GA c/o lower back pain for 4 days and lower abd pain like menstrual cramps on and off For 2 days and some spotting.  Pt has hx of ?ectopic/SAB 12/2013 and SAB 12 weeks 4 days-Pt has had nausea and vomiting for 2 weeks ago. Pt denies UTI sx, constipation or diarrhea Pt had some vaginal discharge with itching and monistat cream with satisactory results. Pt sees Water engineer with confirmation of pregnancy 06/28/2014 when pt seen for bronchitis Pt is to [facility] for care due to hx of preeclamsia     MAU Note 06/30/2014 3:35 PM    Expand All Collapse All   Does not know gestational age; c/o back pain for 4 days; c/o cramping with spotting for past 2 days;        Past Medical History  Diagnosis Date  . PCOS (polycystic ovarian syndrome)   . Depression   . Bipolar disorder   . Bipolar disorder   . Anxiety     Past Surgical History  Procedure Laterality Date  . Cholecystectomy    . Eye surgery      Family History  Problem Relation Age of Onset  . Depression Mother   . Depression Father     History  Substance Use Topics  . Smoking status: Current Every Day Smoker -- 0.30 packs/day    Types: Cigarettes  . Smokeless tobacco: Not on file  . Alcohol Use: No    Allergies:  Allergies  Allergen Reactions  . Hydrocodone Other (See Comments)     Tongue swelling    Prescriptions prior to admission  Medication Sig Dispense Refill Last Dose  . albuterol (PROVENTIL HFA;VENTOLIN HFA) 108 (90 BASE) MCG/ACT inhaler Inhale 2 puffs into the lungs every 6 (six) hours as needed for wheezing or shortness of breath. (Patient not taking: Reported on 06/28/2014) 1 Inhaler 0 Not Taking  . ibuprofen (ADVIL,MOTRIN) 600 MG tablet Take 1 tablet (600 mg total)  by mouth every 6 (six) hours as needed. 30 tablet 0 Unknown  . metFORMIN (GLUCOPHAGE) 500 MG tablet Take 0.5 tablets (250 mg total) by mouth 2 (two) times daily with a meal. (Patient not taking: Reported on 06/28/2014) 30 tablet 6 Not Taking  . norgestimate-ethinyl estradiol (ORTHO-CYCLEN,SPRINTEC,PREVIFEM) 0.25-35 MG-MCG tablet Take 1 tablet by mouth daily. (Patient not taking: Reported on 06/28/2014) 1 Package 6 Not Taking  . polyethylene glycol powder (GLYCOLAX/MIRALAX) powder Take 17 g by mouth 2 (two) times daily as needed. 850 g 1 Unknown  . triamcinolone ointment (KENALOG) 0.5 % Apply 1 application topically 2 (two) times daily. 30 g 0 Unknown    Review of Systems  Constitutional: Negative for fever and chills.  Gastrointestinal: Positive for nausea, vomiting and abdominal pain. Negative for diarrhea and constipation.  Genitourinary: Negative for dysuria.  Musculoskeletal: Positive for back pain.   Physical Exam   Blood pressure 118/76, pulse 85, temperature 98.2 F (36.8 C), temperature source Oral, resp. rate 18, height 5\' 7"  (1.702 m), weight 262 lb (118.842 kg), last menstrual period 05/24/2014.  Physical Exam  Nursing note and vitals reviewed. Constitutional: She is oriented to person, place, and time. She appears well-developed and well-nourished. No distress.  HENT:  Head: Normocephalic.  Eyes: Pupils are equal, round, and reactive to light.  Neck: Normal range of motion. Neck supple.  Cardiovascular: Normal rate.   Respiratory: Effort normal.  GI: Soft.  Genitourinary:  Small amount of creamy discharge in vault; cervix clean, NT; uterus NSSC NT; adnexa without palpable enlargement or tenderness  Musculoskeletal: Normal range of motion.  Neurological: She is alert and oriented to person, place, and time.  Skin: Skin is warm and dry.  Psychiatric: She has a normal mood and affect.    MAU Course  Procedures Results for orders placed or performed during the hospital  encounter of 06/30/14 (from the past 24 hour(s))  Urinalysis, Routine w reflex microscopic     Status: Abnormal   Collection Time: 06/30/14  3:25 PM  Result Value Ref Range   Color, Urine YELLOW YELLOW   APPearance CLEAR CLEAR   Specific Gravity, Urine >1.030 (H) 1.005 - 1.030   pH 6.0 5.0 - 8.0   Glucose, UA NEGATIVE NEGATIVE mg/dL   Hgb urine dipstick NEGATIVE NEGATIVE   Bilirubin Urine NEGATIVE NEGATIVE   Ketones, ur NEGATIVE NEGATIVE mg/dL   Protein, ur NEGATIVE NEGATIVE mg/dL   Urobilinogen, UA 0.2 0.0 - 1.0 mg/dL   Nitrite NEGATIVE NEGATIVE   Leukocytes, UA NEGATIVE NEGATIVE  Wet prep, genital     Status: Abnormal   Collection Time: 06/30/14  4:09 PM  Result Value Ref Range   Yeast Wet Prep HPF POC NONE SEEN NONE SEEN   Trich, Wet Prep NONE SEEN NONE SEEN   Clue Cells Wet Prep HPF POC NONE SEEN NONE SEEN   WBC, Wet Prep HPF POC FEW (A) NONE SEEN  CBC     Status: None   Collection Time: 06/30/14  4:35 PM  Result Value Ref Range   WBC 9.3 4.0 - 10.5 K/uL   RBC 4.79 3.87 - 5.11 MIL/uL   Hemoglobin 14.2 12.0 - 15.0 g/dL   HCT 40.0 36.0 - 46.0 %   MCV 83.5 78.0 - 100.0 fL   MCH 29.6 26.0 - 34.0 pg   MCHC 35.5 30.0 - 36.0 g/dL   RDW 12.7 11.5 - 15.5 %   Platelets 206 150 - 400 K/uL  HCG 426 US Ob Comp Less 14 Wks  06/30/2014   CLINICAL DATA:  29 year old G5 P3 AB1, LMP 05/24/2014 (5 weeks 2 days), presenting with pelvic cramping and vaginal spotting/bleeding.  EXAM: OBSTETRIC <14 WK [name] AND TRANSVAGINAL OB US  TECHNIQUE: Both transabdominal and transvaginal ultrasound examinations were performed for complete evaluation of the gestation as well as the maternal uterus, adnexal regions, and pelvic cul-de-sac. Transvaginal technique was performed to assess early pregnancy.  COMPARISON:  None.  FINDINGS: Intrauterine gestational sac: Not present. Fluid is present within the endometrial canal, but it is present throughout the canal and there is no demonstrable sac. There is a decidual  reaction.  Yolk sac:  Not present.  Embryo:  Not present.  Cardiac Activity: Not applicable.  MSD: Not applicable.  [name] EDC: Not applicable.  Maternal uterus/adnexae: Right ovary normal in size and appearance measuring approximately 3.9 x 2.3 x 2.4 cm, containing a simple cysts measuring 2.6 x 2.0 x 1.7 cm. Left ovary normal in size measuring approximately 3.0 x 1.9 x 2.0 cm, containing adjacent echogenic foci. Normal power Doppler flow is present in both ovaries. No evidence of free pelvic fluid. No other adnexal masses.  IMPRESSION: 1. Fluid within the endometrial canal which does not have the appearance of a  gestational sac. A discrete gestational sac is not visualized. There is decidual reaction involving the endometrium. 2. Echogenic foci within the left ovary, likely reflecting fat within a small dermoid. 3. 2.6 cm simple cyst in the right ovary.   Electronically Signed   By: Evangeline Dakin M.D.   On: 06/30/2014 18:07   US Ob Transvaginal  06/30/2014   CLINICAL DATA:  29 year old G5 P3 AB1, LMP 05/24/2014 (5 weeks 2 days), presenting with pelvic cramping and vaginal spotting/bleeding.  EXAM: OBSTETRIC <14 WK [name] AND TRANSVAGINAL OB US  TECHNIQUE: Both transabdominal and transvaginal ultrasound examinations were performed for complete evaluation of the gestation as well as the maternal uterus, adnexal regions, and pelvic cul-de-sac. Transvaginal technique was performed to assess early pregnancy.  COMPARISON:  None.  FINDINGS: Intrauterine gestational sac: Not present. Fluid is present within the endometrial canal, but it is present throughout the canal and there is no demonstrable sac. There is a decidual reaction.  Yolk sac:  Not present.  Embryo:  Not present.  Cardiac Activity: Not applicable.  MSD: Not applicable.  [name] EDC: Not applicable.  Maternal uterus/adnexae: Right ovary normal in size and appearance measuring approximately 3.9 x 2.3 x 2.4 cm, containing a simple cysts measuring 2.6 x 2.0 x 1.7 cm.  Left ovary normal in size measuring approximately 3.0 x 1.9 x 2.0 cm, containing adjacent echogenic foci. Normal power Doppler flow is present in both ovaries. No evidence of free pelvic fluid. No other adnexal masses.  IMPRESSION: 1. Fluid within the endometrial canal which does not have the appearance of a gestational sac. A discrete gestational sac is not visualized. There is decidual reaction involving the endometrium. 2. Echogenic foci within the left ovary, likely reflecting fat within a small dermoid. 3. 2.6 cm simple cyst in the right ovary.   Electronically Signed   By: Evangeline Dakin M.D.   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

## 2014-07-01 LAB — GC/CHLAMYDIA PROBE AMP (~~LOC~~) NOT AT ARMC
Chlamydia: NEGATIVE
Neisseria Gonorrhea: NEGATIVE

## 2014-07-01 LAB — HIV ANTIBODY (ROUTINE TESTING W REFLEX): HIV Screen 4th Generation wRfx: NONREACTIVE

## 2014-07-04 ENCOUNTER — Ambulatory Visit: Payer: Medicaid Other | Admitting: Obstetrics & Gynecology

## 2014-07-04 ENCOUNTER — Inpatient Hospital Stay (HOSPITAL_COMMUNITY)
Admission: AD | Admit: 2014-07-04 | Discharge: 2014-07-04 | Disposition: A | Discharge status: Home / Self Care | Payer: Medicaid Other | Source: Ambulatory Visit | Attending: Family Medicine | Admitting: Family Medicine

## 2014-07-04 DIAGNOSIS — O26899 Other specified pregnancy related conditions, unspecified trimester: Secondary | ICD-10-CM

## 2014-07-04 DIAGNOSIS — O209 Hemorrhage in early pregnancy, unspecified: Secondary | ICD-10-CM | POA: Diagnosis present

## 2014-07-04 DIAGNOSIS — R109 Unspecified abdominal pain: Secondary | ICD-10-CM | POA: Insufficient documentation

## 2014-07-04 DIAGNOSIS — Z3A01 Less than 8 weeks gestation of pregnancy: Secondary | ICD-10-CM | POA: Diagnosis not present

## 2014-07-04 LAB — HCG, QUANTITATIVE, PREGNANCY: hCG, Beta Chain, Quant, S: 2808 m[IU]/mL — ABNORMAL HIGH (ref <5)

## 2014-07-04 NOTE — MAU Provider Note (Signed)
Subjective:  [name] is a 29 y.o. female [phone] at [redacted]w[redacted]d who presents for a follow up beta hcg level. Currently she is having no pain or bleeding. She was seen on 4/28 with abdominal pain and vaginal bleeding.     Objective:  GENERAL: Well-developed, well-nourished female in no acute distress.  LUNGS: Effort normal SKIN: Warm, dry and without erythema PSYCH: Normal mood and affect  Filed Vitals:   07/04/14 1140  BP: 132/88  Pulse: 75  Resp: 18   Results for orders placed or performed during the hospital encounter of 07/04/14 (from the past 48 hour(s))  hCG, quantitative, pregnancy     Status: Abnormal   Collection Time: 07/04/14  9:40 AM  Result Value Ref Range   hCG, Beta Chain, Quant, S 2808 (H) <5 mIU/mL    MDM:  Quant 4/28: 426 5/2: 2808    Assessment:  1. Abdominal pain in pregnancy, antepartum    Plan:  Discharge home in stable condition Repeat US in 7 days (scheduled) [name] will call to schedule (May 9th scheduled) Patient to follow at the HD  Ectopic precautions Pelvic rest   Lezlie Lye, NP 07/04/2014 11:55 AM

## 2014-07-04 NOTE — MAU Note (Signed)
Pt presents to MAU for repeat quant. Denies any vaginal bleeding or discharge

## 2014-07-04 NOTE — Discharge Instructions (Signed)
Human Chorionic Gonadotropin (hCG) This is a test to confirm and monitor pregnancy or to diagnose trophoblastic disease or germ cell tumors. As early as 10 days after a missed menstrual period (some methods can detect hCG even earlier, at one week after conception) or if your caregiver thinks that your symptoms suggest ectopic pregnancy, a failing pregnancy, trophoblastic disease, or germ cell tumors. hCG is a protein produced in the placenta of a pregnant woman. A pregnancy test is a specific blood or urine test that can detect hCG and confirm pregnancy. This hormone is able to be detected 10 days after a missed menstrual period, the time period when the fertilized egg is implanted in the woman's uterus. With some methods, hCG can be detected even earlier, at one week after conception.  During the early weeks of pregnancy, hCG is important in maintaining function of the corpus luteum (the mass of cells that forms from a mature egg). Production of hCG increases steadily during the first trimester (8-10 weeks), peaking around the 10th week after the last menstrual cycle. Levels then fall slowly during the remainder of the pregnancy. hCG is no longer detectable within a few weeks after delivery. hCG is also produced by some germ cell tumors and increased levels are seen in trophoblastic disease. SAMPLE COLLECTION hCG is commonly detected in urine. The preferred specimen is a random urine sample collected first thing in the morning. hCG can also be measured in blood drawn from a vein in the arm. NORMAL FINDINGS Qualitative:negative in non-pregnant women; positive in pregnancy Quantitative:   Gestation less than 1 week: 5-50 Whole HCG (milli-international units/mL)  Gestation of 2 weeks: 50-500 Whole HCG (milli-international units/mL)  Gestation of 3 weeks: 100-10,000 Whole HCG (milli-international units/mL)  Gestation of 4 weeks: 1,000-30,000 Whole HCG (milli-international units/mL)  Gestation of 5  weeks 3,500-115,000 Whole HCG (milli-international units/mL)  Gestation of 6-8 weeks: 12,000-270,000 Whole HCG (milli-international units/mL)  Gestation of 12 weeks: 15,000-220,000 Whole HCG (milli-international units/mL)  Males and non-pregnant females: less than 5 Whole HCG (milli-international units/mL) Beta subunit: depends on the method and test used Ranges for normal findings may vary among different laboratories and hospitals. You should always check with your doctor after having lab work or other tests done to discuss the meaning of your test results and whether your values are considered within normal limits. MEANING OF TEST  Your caregiver will go over the test results with you and discuss the importance and meaning of your results, as well as treatment options and the need for additional tests if necessary. OBTAINING THE TEST RESULTS It is your responsibility to obtain your test results. Ask the lab or department performing the test when and how you will get your results. Document Released: 03/22/2004 Document Revised: 05/13/2011 Document Reviewed: 05/24/2013 [facility] Patient Information 2015 [place], [place]. This information is not intended to replace advice given to you by your health care provider. Make sure you discuss any questions you have with your health care provider. First Trimester of Pregnancy The first trimester of pregnancy is from week 1 until the end of week 12 (months 1 through 3). During this time, your baby will begin to develop inside you. At 6-8 weeks, the eyes and face are formed, and the heartbeat can be seen on ultrasound. At the end of 12 weeks, all the baby's organs are formed. Prenatal care is all the medical care you receive before the birth of your baby. Make sure you get good prenatal care and follow all  of your doctor's instructions. HOME CARE  Medicines  Take medicine only as told by your doctor. Some medicines are safe and some are not during  pregnancy.  Take your prenatal vitamins as told by your doctor.  Take medicine that helps you poop (stool softener) as needed if your doctor says it is okay. Diet  Eat regular, healthy meals.  Your doctor will tell you the amount of weight gain that is right for you.  Avoid raw meat and uncooked cheese.  If you feel sick to your stomach (nauseous) or throw up (vomit):  Eat 4 or 5 small meals a day instead of 3 large meals.  Try eating a few soda crackers.  Drink liquids between meals instead of during meals.  If you have a hard time pooping (constipation):  Eat high-fiber foods like fresh vegetables, fruit, and whole grains.  Drink enough fluids to keep your pee (urine) clear or pale yellow. Activity and Exercise  Exercise only as told by your doctor. Stop exercising if you have cramps or pain in your lower belly (abdomen) or low back.  Try to avoid standing for long periods of time. Move your legs often if you must stand in one place for a long time.  Avoid heavy lifting.  Wear low-heeled shoes. Sit and stand up straight.  You can have sex unless your doctor tells you not to. Relief of Pain or Discomfort  Wear a good support bra if your breasts are sore.  Take warm water baths (sitz baths) to soothe pain or discomfort caused by hemorrhoids. Use hemorrhoid cream if your doctor says it is okay.  Rest with your legs raised if you have leg cramps or low back pain.  Wear support hose if you have puffy, bulging veins (varicose veins) in your legs. Raise (elevate) your feet for 15 minutes, 3-4 times a day. Limit salt in your diet. Prenatal Care  Schedule your prenatal visits by the twelfth week of pregnancy.  Write down your questions. Take them to your prenatal visits.  Keep all your prenatal visits as told by your doctor. Safety  Wear your seat belt at all times when driving.  Make a list of emergency phone numbers. The list should include numbers for family,  friends, the hospital, and police and fire departments. General Tips  Ask your doctor for a referral to a local prenatal class. Begin classes no later than at the start of month 6 of your pregnancy.  Ask for help if you need counseling or help with nutrition. Your doctor can give you advice or tell you where to go for help.  Do not use hot tubs, steam rooms, or saunas.  Do not douche or use tampons or scented sanitary pads.  Do not cross your legs for long periods of time.  Avoid litter boxes and soil used by cats.  Avoid all smoking, herbs, and alcohol. Avoid drugs not approved by your doctor.  Visit your dentist. At home, brush your teeth with a soft toothbrush. Be gentle when you floss. GET HELP IF:  You are dizzy.  You have mild cramps or pressure in your lower belly.  You have a nagging pain in your belly area.  You continue to feel sick to your stomach, throw up, or have watery poop (diarrhea).  You have a bad smelling fluid coming from your vagina.  You have pain with peeing (urination).  You have increased puffiness (swelling) in your face, hands, legs, or ankles. GET HELP RIGHT  AWAY IF:   You have a fever.  You are leaking fluid from your vagina.  You have spotting or bleeding from your vagina.  You have very bad belly cramping or pain.  You gain or lose weight rapidly.  You throw up blood. It may look like coffee grounds.  You are around people who have [name] measles, fifth disease, or chickenpox.  You have a very bad headache.  You have shortness of breath.  You have any kind of trauma, such as from a fall or a car accident. Document Released: 08/07/2007 Document Revised: 07/05/2013 Document Reviewed: 12/29/2012 [facility] Patient Information 2015 [place], [place]. This information is not intended to replace advice given to you by your health care provider. Make sure you discuss any questions you have with your health care provider.

## 2014-07-05 ENCOUNTER — Ambulatory Visit: Payer: Medicaid Other | Admitting: Family Medicine

## 2014-07-11 ENCOUNTER — Ambulatory Visit (HOSPITAL_COMMUNITY)
Admit: 2014-07-11 | Discharge: 2014-07-11 | Disposition: A | Discharge status: Home / Self Care | Payer: Medicaid Other | Source: Ambulatory Visit | Attending: Medical | Admitting: Medical

## 2014-07-11 ENCOUNTER — Inpatient Hospital Stay (HOSPITAL_COMMUNITY)
Admission: AD | Admit: 2014-07-11 | Discharge: 2014-07-11 | Disposition: A | Discharge status: Home / Self Care | Payer: Medicaid Other | Source: Ambulatory Visit | Attending: Obstetrics and Gynecology | Admitting: Obstetrics and Gynecology

## 2014-07-11 DIAGNOSIS — R109 Unspecified abdominal pain: Secondary | ICD-10-CM | POA: Diagnosis not present

## 2014-07-11 DIAGNOSIS — O9989 Other specified diseases and conditions complicating pregnancy, childbirth and the puerperium: Secondary | ICD-10-CM | POA: Diagnosis present

## 2014-07-11 DIAGNOSIS — N912 Amenorrhea, unspecified: Secondary | ICD-10-CM

## 2014-07-11 DIAGNOSIS — O26899 Other specified pregnancy related conditions, unspecified trimester: Secondary | ICD-10-CM

## 2014-07-11 NOTE — MAU Provider Note (Signed)
Patient here for follow up US results.  Results discussed with patient and significant other; pictures given. Patient advised to start prenatal care.  US Ob Transvaginal  07/11/2014   CLINICAL DATA:  Positive pregnancy test, followup prior possible intrauterine gestational sac for appropriate pregnancy progression and dating  EXAM: TRANSVAGINAL OB ULTRASOUND  TECHNIQUE: Transvaginal ultrasound was performed for complete evaluation of the gestation as well as the maternal uterus, adnexal regions, and pelvic cul-de-sac.  COMPARISON:  06/30/2014  FINDINGS: Intrauterine gestational sac: Visualized/normal in shape.  Yolk sac:  Visualized  Embryo:  Visualized  Cardiac Activity: Visualized  Heart Rate: 103 bpm  CRL:   3  mm   5 w 6d                  [name] EDC: 03/07/15  Maternal uterus/adnexae: Right ovarian corpus luteum. Left ovary is normal. Small to moderate free fluid. Intramural uterine fibroids are identified measuring 1 cm and smaller are identified. Small subchorionic hemorrhage is identified.  IMPRESSION: Intrauterine gestational sac, yolk sac, fetal pole, and cardiac activity identified, with gestational age by today's exam of 5 weeks 6 days by crown-rump length.   Electronically Signed   By: Conchita Paris M.D.   On: 07/11/2014 15:15    Lezlie Lye, NP 07/11/2014 3:35 PM

## 2014-07-28 ENCOUNTER — Ambulatory Visit (INDEPENDENT_AMBULATORY_CARE_PROVIDER_SITE_OTHER): Payer: Medicaid Other | Admitting: Family Medicine

## 2014-07-28 ENCOUNTER — Encounter: Payer: Self-pay | Admitting: Family Medicine

## 2014-07-28 ENCOUNTER — Other Ambulatory Visit (HOSPITAL_COMMUNITY)
Admission: RE | Admit: 2014-07-28 | Discharge: 2014-07-28 | Disposition: A | Discharge status: Home / Self Care | Payer: Medicaid Other | Source: Ambulatory Visit | Attending: Family Medicine | Admitting: Family Medicine

## 2014-07-28 VITALS — BP 128/75 | HR 83 | Wt 266.9 lb

## 2014-07-28 DIAGNOSIS — O0990 Supervision of high risk pregnancy, unspecified, unspecified trimester: Secondary | ICD-10-CM

## 2014-07-28 DIAGNOSIS — O10919 Unspecified pre-existing hypertension complicating pregnancy, unspecified trimester: Secondary | ICD-10-CM | POA: Insufficient documentation

## 2014-07-28 DIAGNOSIS — N760 Acute vaginitis: Secondary | ICD-10-CM | POA: Diagnosis present

## 2014-07-28 DIAGNOSIS — E669 Obesity, unspecified: Secondary | ICD-10-CM

## 2014-07-28 DIAGNOSIS — O0991 Supervision of high risk pregnancy, unspecified, first trimester: Secondary | ICD-10-CM

## 2014-07-28 DIAGNOSIS — Z01419 Encounter for gynecological examination (general) (routine) without abnormal findings: Secondary | ICD-10-CM | POA: Diagnosis present

## 2014-07-28 DIAGNOSIS — O09299 Supervision of pregnancy with other poor reproductive or obstetric history, unspecified trimester: Secondary | ICD-10-CM

## 2014-07-28 DIAGNOSIS — I10 Essential (primary) hypertension: Secondary | ICD-10-CM

## 2014-07-28 DIAGNOSIS — Z113 Encounter for screening for infections with a predominantly sexual mode of transmission: Secondary | ICD-10-CM | POA: Insufficient documentation

## 2014-07-28 DIAGNOSIS — O99210 Obesity complicating pregnancy, unspecified trimester: Secondary | ICD-10-CM

## 2014-07-28 DIAGNOSIS — Z202 Contact with and (suspected) exposure to infections with a predominantly sexual mode of transmission: Secondary | ICD-10-CM

## 2014-07-28 DIAGNOSIS — O10911 Unspecified pre-existing hypertension complicating pregnancy, first trimester: Secondary | ICD-10-CM

## 2014-07-28 DIAGNOSIS — F3160 Bipolar disorder, current episode mixed, unspecified: Secondary | ICD-10-CM

## 2014-07-28 LAB — POCT URINALYSIS DIP (DEVICE)
Bilirubin Urine: NEGATIVE
GLUCOSE, UA: NEGATIVE mg/dL
Hgb urine dipstick: NEGATIVE
KETONES UR: NEGATIVE mg/dL
Nitrite: NEGATIVE
PH: 5.5 (ref 5.0–8.0)
Protein, ur: NEGATIVE mg/dL
Specific Gravity, Urine: >=1.03 (ref 1.005–1.030)
UROBILINOGEN UA: 1 mg/dL (ref 0.0–1.0)

## 2014-07-28 LAB — OB RESULTS CONSOLE RUBELLA ANTIBODY, IGM: Rubella: NON-IMMUNE/NOT IMMUNE

## 2014-07-28 LAB — HEMOGLOBIN A1C
Hgb A1c MFr Bld: 5 % (ref <5.7)
Mean Plasma Glucose: 97 mg/dL (ref <117)

## 2014-07-28 LAB — RPR

## 2014-07-28 MED ORDER — PRENATAL VITAMINS 0.8 MG PO TABS: 1.0000 | ORAL_TABLET | Freq: Every day | ORAL | Status: DC

## 2014-07-28 NOTE — Progress Notes (Signed)
First Screen 08/30/14 @ 3p with [place].

## 2014-07-28 NOTE — Progress Notes (Signed)
Nutrition note: 1st visit consult Pt has h/o obesity. Pt has gained 6.9# @ [redacted]w[redacted]d, which is > expected. Pt is concerned about gaining too much wt during this pregnancy. Pt reports eating 2 meals & 3 snacks/d. Pt works at [employer] so eats at least 1 meal/d there. Pt reports some nausea in the morning but no heartburn. NKFA. Pt is not taking a PNV but plans to soon. Pt received verbal & written education on general nutrition during pregnancy. Encouraged client to watch portion sizes as well as beverages to help control wt gain. Discussed wt gain goals of 11-20# or 0.5#/wk in 2nd & 3rd trimester/d. Pt agrees to start taking her PNV soon. Pt has [employer] in [place] to BF. F/u as needed Vladimir Faster, MS, RD, LDN, [facility]

## 2014-07-28 NOTE — Progress Notes (Signed)
Pt reports that she was involved in a sexual encounter with another couple and the female told her that he has chlamydia. Patient desires to be tested for all STD's.  1 hour at next visit due to eating a lot this morning.

## 2014-07-28 NOTE — Progress Notes (Addendum)
Subjective:    Mackenzie Gibson is a [id number] [redacted]w[redacted]d being seen today for her first obstetrical visit.  Her obstetrical history is significant for obesity, pre-eclampsia and smoker. Patient does intend to breast feed. Pregnancy history fully reviewed.  Patient reports backache, nausea, no bleeding, no cramping, no leaking, vomiting and vaginal discharge.  Reports recent sexual encounter with someone she now knows has chlamydia  Filed Vitals:   07/28/14 0955  BP: 128/75  Pulse: 83  Weight: 266 lb 14.4 oz (121.065 kg)    HISTORY: OB History  Gravida Para Term Preterm AB SAB TAB Ectopic Multiple Living  5 3 3  0 1 1 0 0 0 3    # Outcome Date GA Lbr Len/2nd Weight Sex Delivery Anes PTL Lv  5 Current           4 Term         Y  3 SAB  [redacted]w[redacted]d       FD  2 Term         Y  1 Term         Y     Past Medical History  Diagnosis Date  . PCOS (polycystic ovarian syndrome)   . Depression   . Bipolar disorder   . Bipolar disorder   . Anxiety    Past Surgical History  Procedure Laterality Date  . Cholecystectomy    . Eye surgery     Family History  Problem Relation Age of Onset  . Depression Mother   . Depression Father      Exam    Uterus:    9cm uterus  Pelvic Exam:    Perineum: No Hemorrhoids   Vulva: normal   Vagina:  normal mucosa   Cervix: anteverted, no cervical motion TTP   Adnexa: normal adnexa   Bony Pelvis: average  System: Breast:  normal appearance, no masses or tenderness   Skin: normal coloration and turgor, no rashes    Neurologic: oriented   Extremities: normal strength, tone, and muscle mass   HEENT PERRLA and extra ocular movement intact   Mouth/Teeth mucous membranes moist, pharynx normal without lesions   Neck supple   Cardiovascular: Regular rate   Respiratory:  appears well, vitals normal, no respiratory distress, acyanotic, normal RR, ear and throat exam is normal, neck free of mass or lymphadenopathy, chest clear, no wheezing, crepitations,  rhonchi, normal symmetric air entry   Abdomen: soft, non-tender; bowel sounds normal; no masses,  no organomegaly   Urinary: urethral meatus normal      Assessment:    Pregnancy: [id number] Patient Active Problem List   Diagnosis Date Noted  . Positive pregnancy test 06/27/2014  . Bipolar disorder 01/12/2014  . Chronic pelvic pain in female 01/12/2014  . Eczema, dyshidrotic 12/20/2013  . Wheezing 08/06/2013  . Proteins serum plasma low 07/19/2013  . ELEVATED BP READING WITHOUT DX HYPERTENSION 12/21/2009  . TOBACCO USER 03/06/2009  . SOMATIZATION DISORDER 07/27/2008  . Morbid obesity 10/20/2006  . PANIC DISORDER 10/20/2006  . POLYCYSTIC OVARIAN DISEASE 03/04/2004        Plan:     Initial labs drawn. Prenatal vitamins. Problem list reviewed and updated. Genetic Screening discussed First Screen: requested.  Ultrasound discussed: results reviewed.  Follow up in 4 weeks. 50% of 25 min visit spent on counseling and coordination of care.   Bipolar disorder: declines medications, referral, will monitor Smoking: feels this helps her bipolar disorder, will continue to decrease cigarettes/day if able PCOS,  hx of insulin resistance: hgb A1c today, 1h gtt at next visit as reports eating very lg meal this morning Morbid obesity: discussed expected weight gain, nutritionist next CHTN: blood pressures from previous visits reviewed, intermittent elevated blood pressures over past year.  24h urine studies and baseline labs ordered.  If blood pressures normal prior to 20w GA then would consider removing this diagnosis. Hx of preeclampsia previous pregnancy: agreeable to ASA 81mg  Nausea/Vomiting: rx diclegis  Mackenzie Gibson Mackenzie Gibson 07/28/2014

## 2014-07-29 ENCOUNTER — Telehealth: Payer: Self-pay | Admitting: General Practice

## 2014-07-29 LAB — ANTIBODY SCREEN: Antibody Screen: NEGATIVE

## 2014-07-29 LAB — HIV ANTIBODY (ROUTINE TESTING W REFLEX): HIV 1&2 Ab, 4th Generation: NONREACTIVE

## 2014-07-29 LAB — HEPATITIS B SURFACE ANTIGEN: Hepatitis B Surface Ag: NEGATIVE

## 2014-07-29 MED ORDER — DOXYLAMINE-PYRIDOXINE 10-10 MG PO TBEC: 1.0000 | DELAYED_RELEASE_TABLET | Freq: Three times a day (TID) | ORAL | Status: DC | PRN

## 2014-07-29 MED ORDER — ASPIRIN EC 81 MG PO TBEC: 81.0000 mg | DELAYED_RELEASE_TABLET | Freq: Every day | ORAL | Status: DC

## 2014-07-29 NOTE — Telephone Encounter (Signed)
Called patient and informed her of two prescriptions. Instructed patient that she is [redacted]w[redacted]d and she should start aspirin when she is 13 weeks to help prevent complications from her high blood pressure. Also discussed with patient that diclegis Rx went to pharmacy as well and she should take one tablet in the morning and two at night. Patient verbalized understanding to all and asked about STD screen. Informed patient of neg HIV, RPR, hep B and that her gc/ch and trich results are not back yet but she can call later for those results. Patient verbalized understanding to all and had no questions

## 2014-07-29 NOTE — Telephone Encounter (Signed)
-----   Message from Nila Nephew, MD sent at 07/29/2014 10:53 AM EDT ----- I rx'd ASA and diclegis after AVS printed, please notify pt of these rx's.  We discussed both during visit however I forgot to send it in time.    I prescribed diclegis TID prn as not enough room to go over dosing in rx.  Please review dosing and tapering.

## 2014-07-29 NOTE — Addendum Note (Signed)
Addended by: Nila Nephew on: 07/29/2014 10:53 AM   Modules accepted: Orders

## 2014-07-30 LAB — CULTURE, OB URINE
Colony Count: NO GROWTH
Organism ID, Bacteria: NO GROWTH

## 2014-07-31 ENCOUNTER — Other Ambulatory Visit (HOSPITAL_COMMUNITY)
Admission: AD | Admit: 2014-07-31 | Discharge: 2014-07-31 | Disposition: A | Discharge status: Home / Self Care | Payer: Medicaid Other | Source: Ambulatory Visit | Attending: Family Medicine | Admitting: Family Medicine

## 2014-08-01 LAB — COMPREHENSIVE METABOLIC PANEL
ALBUMIN: 3.8 g/dL (ref 3.5–5.0)
ALT: 27 U/L (ref 14–54)
ANION GAP: 8 (ref 5–15)
AST: 19 U/L (ref 15–41)
Alkaline Phosphatase: 50 U/L (ref 38–126)
BUN: 8 mg/dL (ref 6–20)
CHLORIDE: 105 mmol/L (ref 101–111)
CO2: 26 mmol/L (ref 22–32)
Calcium: 8.9 mg/dL (ref 8.9–10.3)
Creatinine, Ser: 0.62 mg/dL (ref 0.44–1.00)
GFR calc non Af Amer: >60 mL/min (ref >60)
GLUCOSE: 101 mg/dL — AB (ref 65–99)
Potassium: 3.5 mmol/L (ref 3.5–5.1)
Sodium: 139 mmol/L (ref 135–145)
Total Bilirubin: 0.5 mg/dL (ref 0.3–1.2)
Total Protein: 6.7 g/dL (ref 6.5–8.1)

## 2014-08-01 LAB — CREATININE CLEARANCE, URINE, 24 HOUR
COLLECTION INTERVAL-CRCL: 24 h
CREATININE, URINE: 83.56 mg/dL
Creatinine Clearance: 175 mL/min — ABNORMAL HIGH (ref 75–115)
Creatinine, 24H Ur: 1817 mg/d — ABNORMAL HIGH (ref 600–1800)
Urine Total Volume-CRCL: 2175 mL

## 2014-08-02 LAB — RUBELLA ANTIBODY, IGM: RUBELLA: 0.42 (ref <0.91)

## 2014-08-02 LAB — CYTOLOGY - PAP

## 2014-08-03 LAB — PRESCRIPTION MONITORING PROFILE (19 PANEL)
Amphetamine/Meth: NEGATIVE ng/mL
BARBITURATE SCREEN, URINE: NEGATIVE ng/mL
BENZODIAZEPINE SCREEN, URINE: NEGATIVE ng/mL
Buprenorphine, Urine: NEGATIVE ng/mL
CANNABINOID SCRN UR: NEGATIVE ng/mL
CARISOPRODOL, URINE: NEGATIVE ng/mL
COCAINE METABOLITES: NEGATIVE ng/mL
Creatinine, Urine: 158.81 mg/dL (ref >20.0)
Fentanyl, Ur: NEGATIVE ng/mL
MDMA URINE: NEGATIVE ng/mL
MEPERIDINE UR: NEGATIVE ng/mL
Methadone Screen, Urine: NEGATIVE ng/mL
Methaqualone: NEGATIVE ng/mL
Nitrites, Initial: NEGATIVE ug/mL
OXYCODONE SCRN UR: NEGATIVE ng/mL
Opiate Screen, Urine: NEGATIVE ng/mL
PH URINE, INITIAL: 5.6 pH (ref 4.5–8.9)
Phencyclidine, Ur: NEGATIVE ng/mL
Propoxyphene: NEGATIVE ng/mL
TAPENTADOLUR: NEGATIVE ng/mL
Tramadol Scrn, Ur: NEGATIVE ng/mL
ZOLPIDEM, URINE: NEGATIVE ng/mL

## 2014-08-03 LAB — PROTEIN, URINE, 24 HOUR
Collection Interval-UPROT: 24 hours
Protein, 24H Urine: 131 mg/d — ABNORMAL HIGH (ref 50–100)
Protein, Urine: 6 mg/dL
URINE TOTAL VOLUME-UPROT: 2175 mL

## 2014-08-22 ENCOUNTER — Telehealth: Payer: Self-pay | Admitting: *Deleted

## 2014-08-22 NOTE — Telephone Encounter (Signed)
Pt contacted the clinic and wanted to make sure we are aware of a medication the ER placed her on this weekend.  Contacted patient, pt was prescribed Flagyl for BV at [facility] and is concerned she will lose pregnancy if she takes an antibiotic.  Reinforced to patient that we prescribe this medication in pregnancy.  Pt verbalizes understanding.

## 2014-08-26 ENCOUNTER — Other Ambulatory Visit: Payer: Self-pay | Admitting: Family Medicine

## 2014-08-26 DIAGNOSIS — Z3682 Encounter for antenatal screening for nuchal translucency: Secondary | ICD-10-CM

## 2014-08-30 ENCOUNTER — Ambulatory Visit (HOSPITAL_COMMUNITY)
Admission: RE | Admit: 2014-08-30 | Discharge: 2014-08-30 | Disposition: A | Discharge status: Home / Self Care | Payer: Medicaid Other | Source: Ambulatory Visit | Attending: Family Medicine | Admitting: Family Medicine

## 2014-08-30 ENCOUNTER — Encounter: Payer: Self-pay | Admitting: Advanced Practice Midwife

## 2014-08-30 ENCOUNTER — Encounter (HOSPITAL_COMMUNITY): Payer: Self-pay

## 2014-08-30 ENCOUNTER — Ambulatory Visit (INDEPENDENT_AMBULATORY_CARE_PROVIDER_SITE_OTHER): Payer: Medicaid Other | Admitting: Advanced Practice Midwife

## 2014-08-30 VITALS — BP 121/74 | HR 77 | Temp 98.4°F | Wt 266.8 lb

## 2014-08-30 DIAGNOSIS — O09291 Supervision of pregnancy with other poor reproductive or obstetric history, first trimester: Secondary | ICD-10-CM

## 2014-08-30 DIAGNOSIS — Z3682 Encounter for antenatal screening for nuchal translucency: Secondary | ICD-10-CM | POA: Insufficient documentation

## 2014-08-30 DIAGNOSIS — Z36 Encounter for antenatal screening of mother: Secondary | ICD-10-CM | POA: Diagnosis not present

## 2014-08-30 DIAGNOSIS — Z3A13 13 weeks gestation of pregnancy: Secondary | ICD-10-CM | POA: Insufficient documentation

## 2014-08-30 LAB — POCT URINALYSIS DIP (DEVICE)
Bilirubin Urine: NEGATIVE
Glucose, UA: NEGATIVE mg/dL
HGB URINE DIPSTICK: NEGATIVE
Ketones, ur: NEGATIVE mg/dL
Nitrite: NEGATIVE
Protein, ur: NEGATIVE mg/dL
UROBILINOGEN UA: 0.2 mg/dL (ref 0.0–1.0)
pH: 5.5 (ref 5.0–8.0)

## 2014-08-30 NOTE — Progress Notes (Signed)
First trimester screen today.   Subjective:  Mackenzie Gibson is a 29 y.o. [phone] at [redacted]w[redacted]d being seen today for ongoing prenatal care.  Patient reports no complaints.  Contractions: Not present.  Vag. Bleeding: None. Movement: Present. Denies leaking of fluid. Supposed to do early GTT today due to obesity. Ate breakfast high in simple carbs and doesn't have time.   The following portions of the patient's history were reviewed and updated as appropriate: allergies, current medications, past family history, past medical history, past social history, past surgical history and problem list.   Objective:   Filed Vitals:   08/30/14 1209  BP: 121/74  Pulse: 77  Temp: 98.4 F (36.9 C)  Weight: 266 lb 12.8 oz (121.02 kg)    Fetal Status: Fetal Heart Rate (bpm): 150   Movement: Present     General:  Alert, oriented and cooperative. Patient is in no acute distress.  Skin: Skin is warm and dry. No rash noted.   Cardiovascular: Normal heart rate noted  Respiratory: Normal respiratory effort, no problems with respiration noted  Abdomen: Soft, gravid, appropriate for gestational age. Pain/Pressure: Absent     Vaginal: Vag. Bleeding: None.       Cervix: Not evaluated        Extremities: Normal range of motion.  Edema: Trace  Mental Status: Normal mood and affect. Normal behavior. Normal judgment and thought content.   Urinalysis: Urine Protein: Negative Urine Glucose: Negative  Assessment and Plan:  Pregnancy: [id number] at [redacted]w[redacted]d  1. Hx of preeclampsia, prior pregnancy, currently pregnant, first trimester Reviewed normal results of baseline 24 hour urine   Second trimester precautions. including but not limited to vaginal bleeding, contractions, leaking of fluid and fetal movement were reviewed in detail with the patient.  Please refer to After Visit Summary for other counseling recommendations.   Return in about 4 weeks (around 09/27/2014) for ROB and early GTT.   Manya Silvas, CNM

## 2014-08-30 NOTE — Patient Instructions (Signed)
Second Trimester of Pregnancy The second trimester is from week 13 through week 28, months 4 through 6. The second trimester is often a time when you feel your best. Your body has also adjusted to being pregnant, and you begin to feel better physically. Usually, morning sickness has lessened or quit completely, you may have more energy, and you may have an increase in appetite. The second trimester is also a time when the fetus is growing rapidly. At the end of the sixth month, the fetus is about 9 inches long and weighs about 1 pounds. You will likely begin to feel the baby move (quickening) between 18 and 20 weeks of the pregnancy. BODY CHANGES Your body goes through many changes during pregnancy. The changes vary from woman to woman.   Your weight will continue to increase. You will notice your lower abdomen bulging out.  You may begin to get stretch marks on your hips, abdomen, and breasts.  You may develop headaches that can be relieved by medicines approved by your health care provider.  You may urinate more often because the fetus is pressing on your bladder.  You may develop or continue to have heartburn as a result of your pregnancy.  You may develop constipation because certain hormones are causing the muscles that push waste through your intestines to slow down.  You may develop hemorrhoids or swollen, bulging veins (varicose veins).  You may have back pain because of the weight gain and pregnancy hormones relaxing your joints between the bones in your pelvis and as a result of a shift in weight and the muscles that support your balance.  Your breasts will continue to grow and be tender.  Your gums may bleed and may be sensitive to brushing and flossing.  Dark spots or blotches (chloasma, mask of pregnancy) may develop on your face. This will likely fade after the baby is born.  A dark line from your belly button to the pubic area (linea nigra) may appear. This will likely fade  after the baby is born.  You may have changes in your hair. These can include thickening of your hair, rapid growth, and changes in texture. Some women also have hair loss during or after pregnancy, or hair that feels dry or thin. Your hair will most likely return to normal after your baby is born. WHAT TO EXPECT AT YOUR PRENATAL VISITS During a routine prenatal visit:  You will be weighed to make sure you and the fetus are growing normally.  Your blood pressure will be taken.  Your abdomen will be measured to track your baby's growth.  The fetal heartbeat will be listened to.  Any test results from the previous visit will be discussed. Your health care provider may ask you:  How you are feeling.  If you are feeling the baby move.  If you have had any abnormal symptoms, such as leaking fluid, bleeding, severe headaches, or abdominal cramping.  If you have any questions. Other tests that may be performed during your second trimester include:  Blood tests that check for:  Low iron levels (anemia).  Gestational diabetes (between 24 and 28 weeks).  Rh antibodies.  Urine tests to check for infections, diabetes, or protein in the urine.  An ultrasound to confirm the proper growth and development of the baby.  An amniocentesis to check for possible genetic problems.  Fetal screens for spina bifida and Down syndrome. HOME CARE INSTRUCTIONS   Avoid all smoking, herbs, alcohol, and unprescribed   drugs. These chemicals affect the formation and growth of the baby.  Follow your health care provider's instructions regarding medicine use. There are medicines that are either safe or unsafe to take during pregnancy.  Exercise only as directed by your health care provider. Experiencing uterine cramps is a good sign to stop exercising.  Continue to eat regular, healthy meals.  Wear a good support bra for breast tenderness.  Do not use hot tubs, steam rooms, or saunas.  Wear your  seat belt at all times when driving.  Avoid raw meat, uncooked cheese, cat litter boxes, and soil used by cats. These carry germs that can cause birth defects in the baby.  Take your prenatal vitamins.  Try taking a stool softener (if your health care provider approves) if you develop constipation. Eat more high-fiber foods, such as fresh vegetables or fruit and whole grains. Drink plenty of fluids to keep your urine clear or pale yellow.  Take warm sitz baths to soothe any pain or discomfort caused by hemorrhoids. Use hemorrhoid cream if your health care provider approves.  If you develop varicose veins, wear support hose. Elevate your feet for 15 minutes, 3-4 times a day. Limit salt in your diet.  Avoid heavy lifting, wear low heel shoes, and practice good posture.  Rest with your legs elevated if you have leg cramps or low back pain.  Visit your dentist if you have not gone yet during your pregnancy. Use a soft toothbrush to brush your teeth and be gentle when you floss.  A sexual relationship may be continued unless your health care provider directs you otherwise.  Continue to go to all your prenatal visits as directed by your health care provider. SEEK MEDICAL CARE IF:   You have dizziness.  You have mild pelvic cramps, pelvic pressure, or nagging pain in the abdominal area.  You have persistent nausea, vomiting, or diarrhea.  You have a bad smelling vaginal discharge.  You have pain with urination. SEEK IMMEDIATE MEDICAL CARE IF:   You have a fever.  You are leaking fluid from your vagina.  You have spotting or bleeding from your vagina.  You have severe abdominal cramping or pain.  You have rapid weight gain or loss.  You have shortness of breath with chest pain.  You notice sudden or extreme swelling of your face, hands, ankles, feet, or legs.  You have not felt your baby move in over an hour.  You have severe headaches that do not go away with  medicine.  You have vision changes. Document Released: 02/12/2001 Document Revised: 02/23/2013 Document Reviewed: 04/21/2012 Exit[employer] Patient Information 2015 [employer]. This information is not intended to replace advice given to you by your health care provider. Make sure you discuss any questions you have with your health care provider.  

## 2014-08-31 ENCOUNTER — Encounter: Payer: Medicaid Other | Admitting: Family Medicine

## 2014-09-09 ENCOUNTER — Other Ambulatory Visit (HOSPITAL_COMMUNITY): Payer: Self-pay | Admitting: Family Medicine

## 2014-09-27 ENCOUNTER — Ambulatory Visit (INDEPENDENT_AMBULATORY_CARE_PROVIDER_SITE_OTHER): Payer: Medicaid Other | Admitting: Certified Nurse Midwife

## 2014-09-27 ENCOUNTER — Encounter: Payer: Self-pay | Admitting: Certified Nurse Midwife

## 2014-09-27 VITALS — BP 117/85 | HR 80 | Temp 98.2°F | Wt 260.4 lb

## 2014-09-27 DIAGNOSIS — O10912 Unspecified pre-existing hypertension complicating pregnancy, second trimester: Secondary | ICD-10-CM | POA: Diagnosis not present

## 2014-09-27 DIAGNOSIS — I10 Essential (primary) hypertension: Secondary | ICD-10-CM | POA: Diagnosis not present

## 2014-09-27 LAB — POCT URINALYSIS DIP (DEVICE)
Bilirubin Urine: NEGATIVE
Glucose, UA: NEGATIVE mg/dL
Hgb urine dipstick: NEGATIVE
Ketones, ur: NEGATIVE mg/dL
Nitrite: NEGATIVE
Protein, ur: NEGATIVE mg/dL
Specific Gravity, Urine: 1.025 (ref 1.005–1.030)
Urobilinogen, UA: 1 mg/dL (ref 0.0–1.0)
pH: 6.5 (ref 5.0–8.0)

## 2014-09-27 MED ORDER — PROMETHAZINE HCL 25 MG PO TABS: 25.0000 mg | ORAL_TABLET | Freq: Four times a day (QID) | ORAL | Status: DC | PRN

## 2014-09-27 NOTE — Patient Instructions (Signed)
Second Trimester of Pregnancy The second trimester is from week 13 through week 28, months 4 through 6. The second trimester is often a time when you feel your best. Your body has also adjusted to being pregnant, and you begin to feel better physically. Usually, morning sickness has lessened or quit completely, you may have more energy, and you may have an increase in appetite. The second trimester is also a time when the fetus is growing rapidly. At the end of the sixth month, the fetus is about 9 inches long and weighs about 1 pounds. You will likely begin to feel the baby move (quickening) between 18 and 20 weeks of the pregnancy. BODY CHANGES Your body goes through many changes during pregnancy. The changes vary from woman to woman.   Your weight will continue to increase. You will notice your lower abdomen bulging out.  You may begin to get stretch marks on your hips, abdomen, and breasts.  You may develop headaches that can be relieved by medicines approved by your health care provider.  You may urinate more often because the fetus is pressing on your bladder.  You may develop or continue to have heartburn as a result of your pregnancy.  You may develop constipation because certain hormones are causing the muscles that push waste through your intestines to slow down.  You may develop hemorrhoids or swollen, bulging veins (varicose veins).  You may have back pain because of the weight gain and pregnancy hormones relaxing your joints between the bones in your pelvis and as a result of a shift in weight and the muscles that support your balance.  Your breasts will continue to grow and be tender.  Your gums may bleed and may be sensitive to brushing and flossing.  Dark spots or blotches (chloasma, mask of pregnancy) may develop on your face. This will likely fade after the baby is born.  A dark line from your belly button to the pubic area (linea nigra) may appear. This will likely fade  after the baby is born.  You may have changes in your hair. These can include thickening of your hair, rapid growth, and changes in texture. Some women also have hair loss during or after pregnancy, or hair that feels dry or thin. Your hair will most likely return to normal after your baby is born. WHAT TO EXPECT AT YOUR PRENATAL VISITS During a routine prenatal visit:  You will be weighed to make sure you and the fetus are growing normally.  Your blood pressure will be taken.  Your abdomen will be measured to track your baby's growth.  The fetal heartbeat will be listened to.  Any test results from the previous visit will be discussed. Your health care provider may ask you:  How you are feeling.  If you are feeling the baby move.  If you have had any abnormal symptoms, such as leaking fluid, bleeding, severe headaches, or abdominal cramping.  If you have any questions. Other tests that may be performed during your second trimester include:  Blood tests that check for:  Low iron levels (anemia).  Gestational diabetes (between 24 and 28 weeks).  Rh antibodies.  Urine tests to check for infections, diabetes, or protein in the urine.  An ultrasound to confirm the proper growth and development of the baby.  An amniocentesis to check for possible genetic problems.  Fetal screens for spina bifida and Down syndrome. HOME CARE INSTRUCTIONS   Avoid all smoking, herbs, alcohol, and unprescribed   drugs. These chemicals affect the formation and growth of the baby.  Follow your health care provider's instructions regarding medicine use. There are medicines that are either safe or unsafe to take during pregnancy.  Exercise only as directed by your health care provider. Experiencing uterine cramps is a good sign to stop exercising.  Continue to eat regular, healthy meals.  Wear a good support bra for breast tenderness.  Do not use hot tubs, steam rooms, or saunas.  Wear your  seat belt at all times when driving.  Avoid raw meat, uncooked cheese, cat litter boxes, and soil used by cats. These carry germs that can cause birth defects in the baby.  Take your prenatal vitamins.  Try taking a stool softener (if your health care provider approves) if you develop constipation. Eat more high-fiber foods, such as fresh vegetables or fruit and whole grains. Drink plenty of fluids to keep your urine clear or pale yellow.  Take warm sitz baths to soothe any pain or discomfort caused by hemorrhoids. Use hemorrhoid cream if your health care provider approves.  If you develop varicose veins, wear support hose. Elevate your feet for 15 minutes, 3-4 times a day. Limit salt in your diet.  Avoid heavy lifting, wear low heel shoes, and practice good posture.  Rest with your legs elevated if you have leg cramps or low back pain.  Visit your dentist if you have not gone yet during your pregnancy. Use a soft toothbrush to brush your teeth and be gentle when you floss.  A sexual relationship may be continued unless your health care provider directs you otherwise.  Continue to go to all your prenatal visits as directed by your health care provider. SEEK MEDICAL CARE IF:   You have dizziness.  You have mild pelvic cramps, pelvic pressure, or nagging pain in the abdominal area.  You have persistent nausea, vomiting, or diarrhea.  You have a bad smelling vaginal discharge.  You have pain with urination. SEEK IMMEDIATE MEDICAL CARE IF:   You have a fever.  You are leaking fluid from your vagina.  You have spotting or bleeding from your vagina.  You have severe abdominal cramping or pain.  You have rapid weight gain or loss.  You have shortness of breath with chest pain.  You notice sudden or extreme swelling of your face, hands, ankles, feet, or legs.  You have not felt your baby move in over an hour.  You have severe headaches that do not go away with  medicine.  You have vision changes. Document Released: 02/12/2001 Document Revised: 02/23/2013 Document Reviewed: 04/21/2012 Exit[employer] Patient Information 2015 [employer]. This information is not intended to replace advice given to you by your health care provider. Make sure you discuss any questions you have with your health care provider.  

## 2014-09-27 NOTE — Progress Notes (Signed)
Subjective:  Mackenzie Gibson is a 29 y.o. [phone] at [redacted]w[redacted]d being seen today for ongoing prenatal care.  Patient reports headaches once a week.  Contractions: Not present.  Vag. Bleeding: None. Movement: Present. Denies leaking of fluid.   The following portions of the patient's history were reviewed and updated as appropriate: allergies, current medications, past family history, past medical history, past social history, past surgical history and problem list.   Objective:   Filed Vitals:   09/27/14 0957  BP: 117/85  Pulse: 80  Temp: 98.2 F (36.8 C)  Weight: 260 lb 6.4 oz (118.117 kg)    Fetal Status: Fetal Heart Rate (bpm): 147   Movement: Present     General:  Alert, oriented and cooperative. Patient is in no acute distress.  Skin: Skin is warm and dry. No rash noted.   Cardiovascular: Normal heart rate noted  Respiratory: Normal respiratory effort, no problems with respiration noted  Abdomen: Soft, gravid, appropriate for gestational age. Pain/Pressure: Present     Vaginal: Vag. Bleeding: None.       Cervix: Not evaluated        Extremities: Normal range of motion.  Edema: Trace  Mental Status: Normal mood and affect. Normal behavior. Normal judgment and thought content.   Urinalysis: Urine Protein: Negative Urine Glucose: Negative  Assessment and Plan:  Pregnancy: [id number] at [redacted]w[redacted]d  1. Chronic hypertension complicating or reason for care during pregnancy, second trimester  - Glucose Tolerance, 1 HR (50g) - US OB Comp + 14 Wk; Future - Alpha fetoprotein, maternal Phenergan RX for nausea  Preterm labor symptoms and general obstetric precautions including but not limited to vaginal bleeding, contractions, leaking of fluid and fetal movement were reviewed in detail with the patient. Please refer to After Visit Summary for other counseling recommendations.  Return in about 4 weeks (around 10/25/2014).   Larey Days, CNM

## 2014-09-27 NOTE — Progress Notes (Signed)
Pt reports going to [facility] last Wednesday for headache to migraine.   Pain- ligament   Pressure- lower abd  Anatomy US scheduled for August 10th 0815 Early glucola given today

## 2014-09-28 LAB — ALPHA FETOPROTEIN, MATERNAL
AFP: 19.1 ng/mL
Curr Gest Age: 17 wks.days
MoM for AFP: 0.77
Open Spina bifida: NEGATIVE
Osb Risk: <1:27300 {titer}

## 2014-09-28 LAB — GLUCOSE TOLERANCE, 1 HOUR (50G) W/O FASTING: Glucose, 1 Hour GTT: 115 mg/dL (ref 70–140)

## 2014-10-12 ENCOUNTER — Ambulatory Visit (HOSPITAL_COMMUNITY): Payer: Medicaid Other

## 2014-10-17 ENCOUNTER — Ambulatory Visit (HOSPITAL_COMMUNITY)
Admission: RE | Admit: 2014-10-17 | Discharge: 2014-10-17 | Disposition: A | Discharge status: Home / Self Care | Payer: Medicaid Other | Source: Ambulatory Visit | Attending: Certified Nurse Midwife | Admitting: Certified Nurse Midwife

## 2014-10-17 ENCOUNTER — Other Ambulatory Visit: Payer: Self-pay | Admitting: Certified Nurse Midwife

## 2014-10-17 DIAGNOSIS — E669 Obesity, unspecified: Secondary | ICD-10-CM | POA: Diagnosis not present

## 2014-10-17 DIAGNOSIS — Z1389 Encounter for screening for other disorder: Secondary | ICD-10-CM

## 2014-10-17 DIAGNOSIS — I10 Essential (primary) hypertension: Secondary | ICD-10-CM | POA: Diagnosis not present

## 2014-10-17 DIAGNOSIS — Z3A19 19 weeks gestation of pregnancy: Secondary | ICD-10-CM

## 2014-10-17 DIAGNOSIS — Z36 Encounter for antenatal screening of mother: Secondary | ICD-10-CM | POA: Insufficient documentation

## 2014-10-17 DIAGNOSIS — O99213 Obesity complicating pregnancy, third trimester: Secondary | ICD-10-CM

## 2014-10-17 DIAGNOSIS — O10912 Unspecified pre-existing hypertension complicating pregnancy, second trimester: Secondary | ICD-10-CM

## 2014-10-25 ENCOUNTER — Ambulatory Visit (INDEPENDENT_AMBULATORY_CARE_PROVIDER_SITE_OTHER): Payer: Medicaid Other | Admitting: Family Medicine

## 2014-10-25 ENCOUNTER — Encounter: Payer: Self-pay | Admitting: Family Medicine

## 2014-10-25 VITALS — BP 133/62 | HR 94 | Temp 98.1°F | Wt 263.0 lb

## 2014-10-25 DIAGNOSIS — O09292 Supervision of pregnancy with other poor reproductive or obstetric history, second trimester: Secondary | ICD-10-CM

## 2014-10-25 DIAGNOSIS — O10912 Unspecified pre-existing hypertension complicating pregnancy, second trimester: Secondary | ICD-10-CM

## 2014-10-25 DIAGNOSIS — I10 Essential (primary) hypertension: Secondary | ICD-10-CM

## 2014-10-25 DIAGNOSIS — O99212 Obesity complicating pregnancy, second trimester: Secondary | ICD-10-CM

## 2014-10-25 DIAGNOSIS — Z3482 Encounter for supervision of other normal pregnancy, second trimester: Secondary | ICD-10-CM

## 2014-10-25 DIAGNOSIS — Z202 Contact with and (suspected) exposure to infections with a predominantly sexual mode of transmission: Secondary | ICD-10-CM

## 2014-10-25 DIAGNOSIS — L739 Follicular disorder, unspecified: Secondary | ICD-10-CM

## 2014-10-25 DIAGNOSIS — O0990 Supervision of high risk pregnancy, unspecified, unspecified trimester: Secondary | ICD-10-CM | POA: Insufficient documentation

## 2014-10-25 LAB — POCT URINALYSIS DIP (DEVICE)
Bilirubin Urine: NEGATIVE
Glucose, UA: NEGATIVE mg/dL
Hgb urine dipstick: NEGATIVE
Ketones, ur: NEGATIVE mg/dL
Leukocytes, UA: NEGATIVE
Nitrite: NEGATIVE
Protein, ur: NEGATIVE mg/dL
Specific Gravity, Urine: >=1.03 (ref 1.005–1.030)
Urobilinogen, UA: 1 mg/dL (ref 0.0–1.0)
pH: 6.5 (ref 5.0–8.0)

## 2014-10-25 MED ORDER — CHLORHEXIDINE GLUCONATE 4 % EX LIQD: Freq: Every day | CUTANEOUS | Status: DC | PRN

## 2014-10-25 NOTE — Progress Notes (Signed)
Subjective:  Mackenzie Gibson is a 29 y.o. [phone] at [redacted]w[redacted]d being seen today for ongoing prenatal care.  Patient reports vaginal irritation.  Contractions: Not present.  Vag. Bleeding: None. Movement: Present. Denies leaking of fluid.   Mons irritation:  bumps were she usually shaves. vagisil helps. Uses new razor. Bumps for nearly 2 bumps. No vaginal discharge. Reports BV in early pregnancy  HA once per week, resolves with tylenol.  Reports associated with nausea. No blurry vision, changes in vision, RUQ pain. Mild nausea with HA.   The following portions of the patient's history were reviewed and updated as appropriate: allergies, current medications, past family history, past medical history, past social history, past surgical history and problem list.   Objective:   Filed Vitals:   10/25/14 1310  BP: 133/62  Pulse: 94  Temp: 98.1 F (36.7 C)  Weight: 263 lb (119.296 kg)    Fetal Status: Fetal Heart Rate (bpm): 145   Movement: Present     General:  Alert, oriented and cooperative. Patient is in no acute distress.  Skin: Skin is warm and dry. No rash noted.   Cardiovascular: Normal heart rate noted  Respiratory: Normal respiratory effort, no problems with respiration noted  Abdomen: Soft, gravid, appropriate for gestational age. Pain/Pressure: Present     Pelvic: Vag. Bleeding: None     Cervical exam deferred        Extremities: Normal range of motion.  Edema: Trace  Mental Status: Normal mood and affect. Normal behavior. Normal judgment and thought content.   Urinalysis:      Assessment and Plan:  Pregnancy: [id number] at [redacted]w[redacted]d  1. Maternal morbid obesity, antepartum, second trimester Weight gain, 3 lbs total this pregnancy. She is walking and eating healthy.Discussed healthy weight gain as 11-15 lbs total given obesity  2. Hx of preeclampsia, prior pregnancy, currently pregnant, second trimester BP slightly up today (systolic). Continue to monitor for sx sx of  preeclampsia  3. Chronic hypertension complicating or reason for care during pregnancy, second trimester Stable Continue ASA  5. Supervision of normal pregnancy, antepartum, second trimester Updated/added pregnancy check box.  Reviewed normal pregnancy sx  6. Folliculitis on mons: Will give hibiclens. Use daily for 1 week then before shaving.   Preterm labor symptoms and general obstetric precautions including but not limited to vaginal bleeding, contractions, leaking of fluid and fetal movement were reviewed in detail with the patient. Please refer to After Visit Summary for other counseling recommendations.  Return in about 5 days (around 10/30/2014) for Routine prenatal care- 26 wk visit with GTT.   [name], MD

## 2014-10-25 NOTE — Patient Instructions (Signed)
Second Trimester of Pregnancy The second trimester is from week 13 through week 28, months 4 through 6. The second trimester is often a time when you feel your best. Your body has also adjusted to being pregnant, and you begin to feel better physically. Usually, morning sickness has lessened or quit completely, you may have more energy, and you may have an increase in appetite. The second trimester is also a time when the fetus is growing rapidly. At the end of the sixth month, the fetus is about 9 inches long and weighs about 1 pounds. You will likely begin to feel the baby move (quickening) between 18 and 20 weeks of the pregnancy. BODY CHANGES Your body goes through many changes during pregnancy. The changes vary from woman to woman.   Your weight will continue to increase. You will notice your lower abdomen bulging out.  You may begin to get stretch marks on your hips, abdomen, and breasts.  You may develop headaches that can be relieved by medicines approved by your health care provider.  You may urinate more often because the fetus is pressing on your bladder.  You may develop or continue to have heartburn as a result of your pregnancy.  You may develop constipation because certain hormones are causing the muscles that push waste through your intestines to slow down.  You may develop hemorrhoids or swollen, bulging veins (varicose veins).  You may have back pain because of the weight gain and pregnancy hormones relaxing your joints between the bones in your pelvis and as a result of a shift in weight and the muscles that support your balance.  Your breasts will continue to grow and be tender.  Your gums may bleed and may be sensitive to brushing and flossing.  Dark spots or blotches (chloasma, mask of pregnancy) may develop on your face. This will likely fade after the baby is born.  A dark line from your belly button to the pubic area (linea nigra) may appear. This will likely fade  after the baby is born.  You may have changes in your hair. These can include thickening of your hair, rapid growth, and changes in texture. Some women also have hair loss during or after pregnancy, or hair that feels dry or thin. Your hair will most likely return to normal after your baby is born. WHAT TO EXPECT AT YOUR PRENATAL VISITS During a routine prenatal visit:  You will be weighed to make sure you and the fetus are growing normally.  Your blood pressure will be taken.  Your abdomen will be measured to track your baby's growth.  The fetal heartbeat will be listened to.  Any test results from the previous visit will be discussed. Your health care provider may ask you:  How you are feeling.  If you are feeling the baby move.  If you have had any abnormal symptoms, such as leaking fluid, bleeding, severe headaches, or abdominal cramping.  If you have any questions. Other tests that may be performed during your second trimester include:  Blood tests that check for:  Low iron levels (anemia).  Gestational diabetes (between 24 and 28 weeks).  Rh antibodies.  Urine tests to check for infections, diabetes, or protein in the urine.  An ultrasound to confirm the proper growth and development of the baby.  An amniocentesis to check for possible genetic problems.  Fetal screens for spina bifida and Down syndrome. HOME CARE INSTRUCTIONS   Avoid all smoking, herbs, alcohol, and unprescribed   drugs. These chemicals affect the formation and growth of the baby.  Follow your health care provider's instructions regarding medicine use. There are medicines that are either safe or unsafe to take during pregnancy.  Exercise only as directed by your health care provider. Experiencing uterine cramps is a good sign to stop exercising.  Continue to eat regular, healthy meals.  Wear a good support bra for breast tenderness.  Do not use hot tubs, steam rooms, or saunas.  Wear your  seat belt at all times when driving.  Avoid raw meat, uncooked cheese, cat litter boxes, and soil used by cats. These carry germs that can cause birth defects in the baby.  Take your prenatal vitamins.  Try taking a stool softener (if your health care provider approves) if you develop constipation. Eat more high-fiber foods, such as fresh vegetables or fruit and whole grains. Drink plenty of fluids to keep your urine clear or pale yellow.  Take warm sitz baths to soothe any pain or discomfort caused by hemorrhoids. Use hemorrhoid cream if your health care provider approves.  If you develop varicose veins, wear support hose. Elevate your feet for 15 minutes, 3-4 times a day. Limit salt in your diet.  Avoid heavy lifting, wear low heel shoes, and practice good posture.  Rest with your legs elevated if you have leg cramps or low back pain.  Visit your dentist if you have not gone yet during your pregnancy. Use a soft toothbrush to brush your teeth and be gentle when you floss.  A sexual relationship may be continued unless your health care provider directs you otherwise.  Continue to go to all your prenatal visits as directed by your health care provider. SEEK MEDICAL CARE IF:   You have dizziness.  You have mild pelvic cramps, pelvic pressure, or nagging pain in the abdominal area.  You have persistent nausea, vomiting, or diarrhea.  You have a bad smelling vaginal discharge.  You have pain with urination. SEEK IMMEDIATE MEDICAL CARE IF:   You have a fever.  You are leaking fluid from your vagina.  You have spotting or bleeding from your vagina.  You have severe abdominal cramping or pain.  You have rapid weight gain or loss.  You have shortness of breath with chest pain.  You notice sudden or extreme swelling of your face, hands, ankles, feet, or legs.  You have not felt your baby move in over an hour.  You have severe headaches that do not go away with  medicine.  You have vision changes. Document Released: 02/12/2001 Document Revised: 02/23/2013 Document Reviewed: 04/21/2012 Exit[employer] Patient Information 2015 [employer]. This information is not intended to replace advice given to you by your health care provider. Make sure you discuss any questions you have with your health care provider.  

## 2014-11-02 ENCOUNTER — Other Ambulatory Visit: Payer: Self-pay | Admitting: Family Medicine

## 2014-11-02 ENCOUNTER — Ambulatory Visit (HOSPITAL_COMMUNITY)
Admission: RE | Admit: 2014-11-02 | Discharge: 2014-11-02 | Disposition: A | Discharge status: Home / Self Care | Payer: Medicaid Other | Source: Ambulatory Visit | Attending: Family Medicine | Admitting: Family Medicine

## 2014-11-02 VITALS — BP 132/72 | HR 83 | Wt 265.2 lb

## 2014-11-02 DIAGNOSIS — Z3A22 22 weeks gestation of pregnancy: Secondary | ICD-10-CM | POA: Insufficient documentation

## 2014-11-02 DIAGNOSIS — E669 Obesity, unspecified: Secondary | ICD-10-CM | POA: Insufficient documentation

## 2014-11-02 DIAGNOSIS — O99212 Obesity complicating pregnancy, second trimester: Secondary | ICD-10-CM | POA: Insufficient documentation

## 2014-11-02 DIAGNOSIS — IMO0002 Reserved for concepts with insufficient information to code with codable children: Secondary | ICD-10-CM

## 2014-11-02 DIAGNOSIS — Z36 Encounter for antenatal screening of mother: Secondary | ICD-10-CM | POA: Insufficient documentation

## 2014-11-02 DIAGNOSIS — Z0489 Encounter for examination and observation for other specified reasons: Secondary | ICD-10-CM

## 2014-11-02 DIAGNOSIS — O4402 Placenta previa specified as without hemorrhage, second trimester: Secondary | ICD-10-CM | POA: Diagnosis not present

## 2014-11-02 DIAGNOSIS — O10019 Pre-existing essential hypertension complicating pregnancy, unspecified trimester: Secondary | ICD-10-CM

## 2014-11-02 DIAGNOSIS — O10912 Unspecified pre-existing hypertension complicating pregnancy, second trimester: Secondary | ICD-10-CM | POA: Diagnosis present

## 2014-11-02 DIAGNOSIS — Z3482 Encounter for supervision of other normal pregnancy, second trimester: Secondary | ICD-10-CM

## 2014-11-29 ENCOUNTER — Encounter (HOSPITAL_COMMUNITY): Payer: Self-pay

## 2014-11-29 ENCOUNTER — Other Ambulatory Visit (HOSPITAL_COMMUNITY): Payer: Self-pay | Admitting: Obstetrics and Gynecology

## 2014-11-29 ENCOUNTER — Ambulatory Visit (HOSPITAL_COMMUNITY)
Admission: RE | Admit: 2014-11-29 | Discharge: 2014-11-29 | Disposition: A | Discharge status: Home / Self Care | Payer: Medicaid Other | Source: Ambulatory Visit | Attending: Family | Admitting: Family

## 2014-11-29 ENCOUNTER — Ambulatory Visit (INDEPENDENT_AMBULATORY_CARE_PROVIDER_SITE_OTHER): Payer: Medicaid Other | Admitting: Family

## 2014-11-29 ENCOUNTER — Encounter: Payer: Self-pay | Admitting: *Deleted

## 2014-11-29 VITALS — BP 118/74 | HR 86 | Temp 97.6°F | Wt 262.2 lb

## 2014-11-29 DIAGNOSIS — Z3A26 26 weeks gestation of pregnancy: Secondary | ICD-10-CM | POA: Diagnosis not present

## 2014-11-29 DIAGNOSIS — O10912 Unspecified pre-existing hypertension complicating pregnancy, second trimester: Secondary | ICD-10-CM

## 2014-11-29 DIAGNOSIS — E669 Obesity, unspecified: Secondary | ICD-10-CM | POA: Insufficient documentation

## 2014-11-29 DIAGNOSIS — O99212 Obesity complicating pregnancy, second trimester: Secondary | ICD-10-CM | POA: Insufficient documentation

## 2014-11-29 DIAGNOSIS — Z3482 Encounter for supervision of other normal pregnancy, second trimester: Secondary | ICD-10-CM

## 2014-11-29 DIAGNOSIS — O09292 Supervision of pregnancy with other poor reproductive or obstetric history, second trimester: Secondary | ICD-10-CM | POA: Diagnosis not present

## 2014-11-29 DIAGNOSIS — I10 Essential (primary) hypertension: Secondary | ICD-10-CM

## 2014-11-29 LAB — POCT URINALYSIS DIP (DEVICE)
GLUCOSE, UA: NEGATIVE mg/dL
HGB URINE DIPSTICK: NEGATIVE
Ketones, ur: NEGATIVE mg/dL
NITRITE: NEGATIVE
PH: 7 (ref 5.0–8.0)
PROTEIN: NEGATIVE mg/dL
Specific Gravity, Urine: 1.02 (ref 1.005–1.030)
UROBILINOGEN UA: 1 mg/dL (ref 0.0–1.0)

## 2014-11-29 LAB — CBC
HCT: 37.3 % (ref 36.0–46.0)
HEMOGLOBIN: 12.8 g/dL (ref 12.0–15.0)
MCH: 31.1 pg (ref 26.0–34.0)
MCHC: 34.3 g/dL (ref 30.0–36.0)
MCV: 90.5 fL (ref 78.0–100.0)
MPV: 10.9 fL (ref 8.6–12.4)
Platelets: 170 10*3/uL (ref 150–400)
RBC: 4.12 MIL/uL (ref 3.87–5.11)
RDW: 13.7 % (ref 11.5–15.5)
WBC: 11 10*3/uL — ABNORMAL HIGH (ref 4.0–10.5)

## 2014-11-29 NOTE — Progress Notes (Signed)
Subjective:  Mackenzie Gibson is a 29 y.o. [phone] at [redacted]w[redacted]d being seen today for ongoing prenatal care.  Patient reports headache.  Taking Tylenol (1/2) with minimal relief.   Contractions: Irritability.  Vag. Bleeding: None. Movement: Present. Denies leaking of fluid.   The following portions of the patient's history were reviewed and updated as appropriate: allergies, current medications, past family history, past medical history, past social history, past surgical history and problem list.   Objective:   Filed Vitals:   11/29/14 1051  BP: 118/74  Pulse: 86  Temp: 97.6 F (36.4 C)  Weight: 262 lb 3.2 oz (118.933 kg)    Fetal Status: Fetal Heart Rate (bpm): 135 Fundal Height: 27 cm Movement: Present     General:  Alert, oriented and cooperative. Patient is in no acute distress.  Skin: Skin is warm and dry. No rash noted.   Cardiovascular: Normal heart rate noted  Respiratory: Normal respiratory effort, no problems with respiration noted  Abdomen: Soft, gravid, appropriate for gestational age. Pain/Pressure: Absent     Pelvic: Vag. Bleeding: None     Cervical exam deferred        Extremities: Normal range of motion.  Edema: Trace  Mental Status: Normal mood and affect. Normal behavior. Normal judgment and thought content.   Urinalysis: Urine Protein: Negative Urine Glucose: Negative  Assessment and Plan:  Pregnancy: G5P3013 at [redacted]w[redacted]d  1. Chronic hypertension complicating or reason for care during pregnancy, second trimester - CBC - RPR - HIV antibody (with reflex) - Glucose Tolerance, 1 HR (50g) w/o Fasting  2. Hx of preeclampsia, prior pregnancy, currently pregnant, second trimester - Growth [name] today (results not available in Epic) > reports normal  3. Supervision of normal pregnancy, antepartum, second trimester - BTL consent signed today  Preterm labor symptoms and general obstetric precautions including but not limited to vaginal bleeding, contractions, leaking of  fluid and fetal movement were reviewed in detail with the patient. Please refer to After Visit Summary for other counseling recommendations.  Return in about 2 weeks (around 12/13/2014).   Venia Carbon Michiel Cowboy, CNM

## 2014-11-29 NOTE — Progress Notes (Signed)
Patient declined tdap & flu

## 2014-11-30 LAB — GLUCOSE TOLERANCE, 1 HOUR (50G) W/O FASTING: Glucose, 1 Hour GTT: 117 mg/dL (ref 70–140)

## 2014-11-30 LAB — HIV ANTIBODY (ROUTINE TESTING W REFLEX): HIV 1&2 Ab, 4th Generation: NONREACTIVE

## 2014-11-30 LAB — RPR

## 2014-12-13 ENCOUNTER — Ambulatory Visit (INDEPENDENT_AMBULATORY_CARE_PROVIDER_SITE_OTHER): Payer: Medicaid Other | Admitting: Obstetrics and Gynecology

## 2014-12-13 ENCOUNTER — Encounter: Payer: Self-pay | Admitting: Obstetrics and Gynecology

## 2014-12-13 VITALS — BP 125/41 | HR 85 | Temp 98.0°F | Wt 263.0 lb

## 2014-12-13 DIAGNOSIS — I10 Essential (primary) hypertension: Secondary | ICD-10-CM

## 2014-12-13 DIAGNOSIS — O10913 Unspecified pre-existing hypertension complicating pregnancy, third trimester: Secondary | ICD-10-CM | POA: Diagnosis not present

## 2014-12-13 LAB — POCT URINALYSIS DIP (DEVICE)
Glucose, UA: NEGATIVE mg/dL
HGB URINE DIPSTICK: NEGATIVE
Ketones, ur: NEGATIVE mg/dL
Nitrite: NEGATIVE
Protein, ur: NEGATIVE mg/dL
SPECIFIC GRAVITY, URINE: 1.025 (ref 1.005–1.030)
UROBILINOGEN UA: 2 mg/dL — AB (ref 0.0–1.0)
pH: 7 (ref 5.0–8.0)

## 2014-12-13 NOTE — Progress Notes (Signed)
Subjective:  Mackenzie Gibson is a 29 y.o. [phone] at [redacted]w[redacted]d being seen today for ongoing prenatal care.  Patient reports intermittent contrctions, wants cervix checked.  Contractions: Irritability.  Vag. Bleeding: None. Movement: Present. Denies leaking of fluid.   The following portions of the patient's history were reviewed and updated as appropriate: allergies, current medications, past family history, past medical history, past social history, past surgical history and problem list. Problem list updated.  Objective:   Filed Vitals:   12/13/14 0940  BP: 125/41  Pulse: 85  Temp: 98 F (36.7 C)  Weight: 263 lb (119.296 kg)    Fetal Status: Fetal Heart Rate (bpm): 138   Movement: Present     General:  Alert, oriented and cooperative. Patient is in no acute distress.  Skin: Skin is warm and dry. No rash noted.   Cardiovascular: Normal heart rate noted  Respiratory: Normal respiratory effort, no problems with respiration noted  Abdomen: Soft, gravid, appropriate for gestational age. Pain/Pressure: Present     Pelvic: Vag. Bleeding: None Vag D/C Character: Mucous   Fingertip/thick/high  Extremities: Normal range of motion.  Edema: Trace  Mental Status: Normal mood and affect. Normal behavior. Normal judgment and thought content.   Urinalysis:      Assessment and Plan:  Pregnancy: G5P3013 at [redacted]w[redacted]d  1. Chronic hypertension complicating or reason for care during pregnancy, third trimester - adamant about not taking aspirin - BP wnl today - EFW wnl at 26 weeks, continuing q 4 wk u/s - start 2x weekly nsts at 32 wks - adamant about no flu or tdap - gtt wnl - cervix not dilated today; preterm labor and pprom instructions given  Preterm labor symptoms and general obstetric precautions including but not limited to vaginal bleeding, contractions, leaking of fluid and fetal movement were reviewed in detail with the patient. Please refer to After Visit Summary for other counseling  recommendations.  Return in about 2 weeks (around 12/27/2014).   Gwynne Edinger, MD

## 2014-12-13 NOTE — Progress Notes (Signed)
Patient reports pelvic contractions 4-5 times a day

## 2014-12-24 ENCOUNTER — Inpatient Hospital Stay (HOSPITAL_COMMUNITY)
Admission: AD | Admit: 2014-12-24 | Discharge: 2014-12-24 | Disposition: A | Discharge status: Home / Self Care | Payer: Medicaid Other | Source: Ambulatory Visit | Attending: Family Medicine | Admitting: Family Medicine

## 2014-12-24 ENCOUNTER — Encounter (HOSPITAL_COMMUNITY): Payer: Self-pay | Admitting: *Deleted

## 2014-12-24 DIAGNOSIS — O2243 Hemorrhoids in pregnancy, third trimester: Secondary | ICD-10-CM | POA: Insufficient documentation

## 2014-12-24 DIAGNOSIS — R112 Nausea with vomiting, unspecified: Secondary | ICD-10-CM

## 2014-12-24 DIAGNOSIS — Z720 Tobacco use: Secondary | ICD-10-CM | POA: Diagnosis present

## 2014-12-24 DIAGNOSIS — O09292 Supervision of pregnancy with other poor reproductive or obstetric history, second trimester: Secondary | ICD-10-CM

## 2014-12-24 DIAGNOSIS — F319 Bipolar disorder, unspecified: Secondary | ICD-10-CM | POA: Diagnosis present

## 2014-12-24 DIAGNOSIS — I10 Essential (primary) hypertension: Secondary | ICD-10-CM

## 2014-12-24 DIAGNOSIS — O10913 Unspecified pre-existing hypertension complicating pregnancy, third trimester: Secondary | ICD-10-CM

## 2014-12-24 DIAGNOSIS — F3160 Bipolar disorder, current episode mixed, unspecified: Secondary | ICD-10-CM | POA: Diagnosis present

## 2014-12-24 DIAGNOSIS — O212 Late vomiting of pregnancy: Secondary | ICD-10-CM | POA: Diagnosis not present

## 2014-12-24 DIAGNOSIS — K5900 Constipation, unspecified: Secondary | ICD-10-CM | POA: Insufficient documentation

## 2014-12-24 DIAGNOSIS — Z3A29 29 weeks gestation of pregnancy: Secondary | ICD-10-CM | POA: Insufficient documentation

## 2014-12-24 DIAGNOSIS — O10919 Unspecified pre-existing hypertension complicating pregnancy, unspecified trimester: Secondary | ICD-10-CM | POA: Diagnosis present

## 2014-12-24 DIAGNOSIS — O99213 Obesity complicating pregnancy, third trimester: Secondary | ICD-10-CM | POA: Diagnosis not present

## 2014-12-24 DIAGNOSIS — O09299 Supervision of pregnancy with other poor reproductive or obstetric history, unspecified trimester: Secondary | ICD-10-CM

## 2014-12-24 DIAGNOSIS — F17200 Nicotine dependence, unspecified, uncomplicated: Secondary | ICD-10-CM | POA: Diagnosis present

## 2014-12-24 DIAGNOSIS — Z3482 Encounter for supervision of other normal pregnancy, second trimester: Secondary | ICD-10-CM

## 2014-12-24 DIAGNOSIS — Z202 Contact with and (suspected) exposure to infections with a predominantly sexual mode of transmission: Secondary | ICD-10-CM

## 2014-12-24 DIAGNOSIS — K5901 Slow transit constipation: Secondary | ICD-10-CM

## 2014-12-24 LAB — URINALYSIS, ROUTINE W REFLEX MICROSCOPIC
Bilirubin Urine: NEGATIVE
GLUCOSE, UA: NEGATIVE mg/dL
HGB URINE DIPSTICK: NEGATIVE
KETONES UR: NEGATIVE mg/dL
LEUKOCYTES UA: NEGATIVE
Nitrite: NEGATIVE
PH: 6 (ref 5.0–8.0)
Protein, ur: NEGATIVE mg/dL
Specific Gravity, Urine: >1.03 — ABNORMAL HIGH (ref 1.005–1.030)
Urobilinogen, UA: 1 mg/dL (ref 0.0–1.0)

## 2014-12-24 MED ORDER — PSYLLIUM 55.46 % PO POWD: 1.0000 | Freq: Every day | ORAL | Status: DC

## 2014-12-24 MED ORDER — DOCUSATE SODIUM 100 MG PO CAPS: 100.0000 mg | ORAL_CAPSULE | Freq: Two times a day (BID) | ORAL | Status: DC

## 2014-12-24 MED ORDER — POLYETHYLENE GLYCOL 3350 17 GM/SCOOP PO POWD: 1.0000 | Freq: Once | ORAL | Status: DC

## 2014-12-24 NOTE — MAU Note (Addendum)
Called advice line to find out what she can take for hemorrhoids and constipation.  Nauseated today.has been having some back pain.

## 2014-12-24 NOTE — MAU Provider Note (Signed)
History    CSN: [id number] Arrival date and time: 12/24/14 1232 First Provider Initiated Contact with Patient 12/24/14 1327     Chief Complaint  Patient presents with  . Emesis  . Hemorrhoids  . Constipation   HPI Patient is 29 y.o. [id number] [redacted]w[redacted]d here with complaints of emesis x1 this am that was non-bloody, non-bilious. Reports vomitus was yellowish. She report FOB had GI bug with nausea/vomitting and diarrhea 3 weeks ago.  Reports nausea. She was able to drink coke and had breakfast. Reports she has severe constipation. Reports she has hemorrhoids with constipation. Last BM was yesterday, very hard and felt incomplete. She reports it feels "like I have a pencil up my butt." She call the RN line in th clinic to ask about safe medications for constipation and was told to come to the MAU due to her emesis x1.   No burning or stinging with urination.  +FM, denies LOF, VB, contractions, vaginal discharge.  OB History    Gravida Para Term Preterm AB TAB SAB Ectopic Multiple Living   6 3 3  0 1 0 1 0 0 3      Past Medical History  Diagnosis Date  . PCOS (polycystic ovarian syndrome)   . Depression   . Bipolar disorder ([place])   . Bipolar disorder ([place])   . Anxiety     Past Surgical History  Procedure Laterality Date  . Cholecystectomy    . Eye surgery      Family History  Problem Relation Age of Onset  . Depression Mother   . Depression Father     Social History  Substance Use Topics  . Smoking status: Current Every Day Smoker -- 0.30 packs/day    Types: Cigarettes  . Smokeless tobacco: None  . Alcohol Use: No    Allergies:  Allergies  Allergen Reactions  . Hydrocodone Other (See Comments)     Tongue swelling    No prescriptions prior to admission    Review of Systems  Constitutional: Negative for fever and chills.  Eyes: Negative for blurred vision and double vision.  Respiratory: Negative for cough and shortness of breath.   Cardiovascular: Negative for  chest pain and orthopnea.  Gastrointestinal: Negative for nausea and vomiting.  Genitourinary: Negative for dysuria, frequency and flank pain.  Musculoskeletal: Negative for myalgias.  Skin: Negative for rash.  Neurological: Negative for dizziness, tingling, weakness and headaches.  Endo/Heme/Allergies: Does not bruise/bleed easily.  Psychiatric/Behavioral: Negative for depression and suicidal ideas. The patient is not nervous/anxious.    Physical Exam   Blood pressure 140/79, pulse 95, temperature 97.7 F (36.5 C), temperature source Oral, resp. rate 18, last menstrual period 05/24/2014, unknown if currently breastfeeding.  Physical Exam  Nursing note and vitals reviewed. Constitutional: She is oriented to person, place, and time. She appears well-developed and well-nourished. No distress.  Pregnant female  HENT:  Head: Normocephalic and atraumatic.  Eyes: Conjunctivae are normal. No scleral icterus.  Neck: Normal range of motion. Neck supple.  Cardiovascular: Normal rate and intact distal pulses.   Respiratory: Effort normal. She exhibits no tenderness.  GI: Soft. There is no tenderness. There is no rebound and no guarding.  Gravid  Genitourinary: Vagina normal.  Musculoskeletal: Normal range of motion. She exhibits no edema.  Neurological: She is alert and oriented to person, place, and time.  Skin: Skin is warm and dry. No rash noted.  Psychiatric: She has a normal mood and affect.    MAU Course  Procedures  MDM NST reactive  Assessment and Plan  MADALENE MICKLER is 29 y.o. [phone] at [redacted]w[redacted]d presenting with non-blood, non-bilious vomiting x1 and constipation with hemorrhoids  #Emesis: non-concerning. Discussed importance of small frequent meals. Return if not tolerating PO  #Constipation; Discussed the below regimen and provided to the patient in her AVS.  Miralax Clean out  Take 8 capfuls of miralax in 64 oz of gatorade  Continue to drink at least eight 8 oz  glasses of water throughout the day  You can repeat with another 8 capfuls of miralax in 64 oz of gatorade.   After you are cleaned out: - Start Colace100mg  twice daily - Start Miralax once daily - Start a daily fiber supplement like metamucil or citrucel - You can safely use enemas in pregnancy   [name] 12/24/2014, 1:27 PM

## 2014-12-24 NOTE — Discharge Instructions (Signed)
Miralax Clean out  Take 8 capfuls of miralax in 64 oz of gatorade  Continue to drink at least eight 8 oz glasses of water throughout the day  You can repeat with another 8 capfuls of miralax in 64 oz of gatorade.   After you are cleaned out: - Start Colace100mg  twice daily - Start Miralax once daily - Start a daily fiber supplement like metamucil or citrucel - You can safely use enemas in pregnancy  Safe Medications in Pregnancy  Acne: Benzoyl Peroxide Salicylic Acid  Backache/Headache: Tylenol: 2 regular strength every 4 hours OR              2 Extra strength every 6 hours  Colds/Coughs/Allergies: Benadryl (alcohol free) 25 mg every 6 hours as needed Breath right strips Claritin Cepacol throat lozenges Chloraseptic throat spray Cold-Eeze- up to three times per day Cough drops, alcohol free Flonase (by prescription only) Guaifenesin Mucinex Robitussin DM (plain only, alcohol free) Saline nasal spray/drops Sudafed (pseudoephedrine) & Actifed ** use only after [redacted] weeks gestation and if you do not have high blood pressure Tylenol Vicks Vaporub Zinc lozenges Zyrtec   Constipation: Colace Ducolax suppositories Fleet enema Glycerin suppositories Metamucil Milk of magnesia Miralax Senokot Smooth move tea  Diarrhea: Kaopectate Imodium A-D  *NO pepto Bismol  Hemorrhoids: Anusol Anusol HC Preparation H Tucks  Indigestion: Tums Maalox Mylanta Zantac  Pepcid  Insomnia: Benadryl (alcohol free) 25mg  every 6 hours as needed Tylenol PM Unisom, no Gelcaps  Leg Cramps: Tums MagGel  Nausea/Vomiting:  Bonine Dramamine Emetrol Ginger extract Sea bands Meclizine  Nausea medication to take during pregnancy:  Unisom (doxylamine succinate 25 mg tablets) Take one tablet daily at bedtime. If symptoms are not adequately controlled, the dose can be increased to a maximum recommended dose of two tablets daily (1/2 tablet in the morning, 1/2 tablet  mid-afternoon and one at bedtime). Vitamin B6 100mg  tablets. Take one tablet twice a day (up to 200 mg per day).  Skin Rashes: Aveeno products Benadryl cream or 25mg  every 6 hours as needed Calamine Lotion 1% cortisone cream  Yeast infection: Gyne-lotrimin 7 Monistat 7   **If taking multiple medications, please check labels to avoid duplicating the same active ingredients **take medication as directed on the label ** Do not exceed 4000 mg of tylenol in 24 hours **Do not take medications that contain aspirin or ibuprofen

## 2014-12-27 ENCOUNTER — Encounter: Payer: Medicaid Other | Admitting: Family

## 2014-12-29 ENCOUNTER — Encounter (HOSPITAL_COMMUNITY): Payer: Self-pay

## 2014-12-29 ENCOUNTER — Ambulatory Visit (HOSPITAL_COMMUNITY)
Admission: RE | Admit: 2014-12-29 | Discharge: 2014-12-29 | Disposition: A | Discharge status: Home / Self Care | Payer: Medicaid Other | Source: Ambulatory Visit | Attending: Family | Admitting: Family

## 2014-12-29 ENCOUNTER — Other Ambulatory Visit (HOSPITAL_COMMUNITY): Payer: Self-pay | Admitting: Maternal and Fetal Medicine

## 2014-12-29 ENCOUNTER — Ambulatory Visit (INDEPENDENT_AMBULATORY_CARE_PROVIDER_SITE_OTHER): Payer: Medicaid Other | Admitting: Obstetrics & Gynecology

## 2014-12-29 VITALS — BP 124/76 | HR 88 | Wt 265.8 lb

## 2014-12-29 VITALS — BP 132/83 | HR 95 | Temp 97.8°F | Wt 264.8 lb

## 2014-12-29 DIAGNOSIS — O10912 Unspecified pre-existing hypertension complicating pregnancy, second trimester: Secondary | ICD-10-CM | POA: Insufficient documentation

## 2014-12-29 DIAGNOSIS — I10 Essential (primary) hypertension: Secondary | ICD-10-CM

## 2014-12-29 DIAGNOSIS — O10919 Unspecified pre-existing hypertension complicating pregnancy, unspecified trimester: Secondary | ICD-10-CM

## 2014-12-29 DIAGNOSIS — Z3483 Encounter for supervision of other normal pregnancy, third trimester: Secondary | ICD-10-CM

## 2014-12-29 DIAGNOSIS — O99213 Obesity complicating pregnancy, third trimester: Secondary | ICD-10-CM | POA: Insufficient documentation

## 2014-12-29 DIAGNOSIS — Z3A30 30 weeks gestation of pregnancy: Secondary | ICD-10-CM

## 2014-12-29 DIAGNOSIS — E669 Obesity, unspecified: Secondary | ICD-10-CM | POA: Diagnosis not present

## 2014-12-29 DIAGNOSIS — O10913 Unspecified pre-existing hypertension complicating pregnancy, third trimester: Secondary | ICD-10-CM | POA: Diagnosis not present

## 2014-12-29 LAB — POCT URINALYSIS DIP (DEVICE)
BILIRUBIN URINE: NEGATIVE
GLUCOSE, UA: NEGATIVE mg/dL
HGB URINE DIPSTICK: NEGATIVE
Ketones, ur: NEGATIVE mg/dL
Nitrite: NEGATIVE
Protein, ur: NEGATIVE mg/dL
SPECIFIC GRAVITY, URINE: 1.02 (ref 1.005–1.030)
Urobilinogen, UA: 1 mg/dL (ref 0.0–1.0)
pH: 7 (ref 5.0–8.0)

## 2014-12-29 NOTE — Progress Notes (Signed)
Edema- ankles/hands   Pain-ligament

## 2014-12-29 NOTE — Progress Notes (Signed)
Subjective:  Mackenzie Gibson is a 29 y.o. [phone] at [redacted]w[redacted]d being seen today for ongoing prenatal care.  Patient reports no complaints.  Contractions: Not present.  Vag. Bleeding: None. Movement: Present. Denies leaking of fluid.   The following portions of the patient's history were reviewed and updated as appropriate: allergies, current medications, past family history, past medical history, past social history, past surgical history and problem list. Problem list updated.  Objective:   Filed Vitals:   12/29/14 1017  BP: 132/83  Pulse: 95  Temp: 97.8 F (36.6 C)  Weight: 264 lb 12.8 oz (120.112 kg)    Fetal Status: Fetal Heart Rate (bpm): 136 Fundal Height: 34 cm Movement: Present     General:  Alert, oriented and cooperative. Patient is in no acute distress.  Skin: Skin is warm and dry. No rash noted.   Cardiovascular: Normal heart rate noted  Respiratory: Normal respiratory effort, no problems with respiration noted  Abdomen: Soft, gravid, appropriate for gestational age. Pain/Pressure: Present     Pelvic: Vag. Bleeding: None     Cervical exam deferred        Extremities: Normal range of motion.  Edema: Trace  Mental Status: Normal mood and affect. Normal behavior. Normal judgment and thought content.   Urinalysis: Urine Protein: Negative Urine Glucose: Negative  Assessment and Plan:  Pregnancy: [id number] at [redacted]w[redacted]d  1. Chronic hypertension complicating or reason for care during pregnancy, third trimester -BP controlled.  Getting monthly [name].  2. Supervision of normal pregnancy, antepartum, third trimester -bipolar stable.  Preterm labor symptoms and general obstetric precautions including but not limited to vaginal bleeding, contractions, leaking of fluid and fetal movement were reviewed in detail with the patient. Please refer to After Visit Summary for other counseling recommendations.  Return in about 2 weeks (around 01/12/2015).   Guss Bunde, MD

## 2015-01-09 ENCOUNTER — Encounter (HOSPITAL_COMMUNITY): Payer: Self-pay | Admitting: *Deleted

## 2015-01-09 ENCOUNTER — Inpatient Hospital Stay (HOSPITAL_COMMUNITY)
Admission: AD | Admit: 2015-01-09 | Discharge: 2015-01-09 | Disposition: A | Discharge status: Home / Self Care | Payer: Medicaid Other | Source: Ambulatory Visit | Attending: Family Medicine | Admitting: Family Medicine

## 2015-01-09 DIAGNOSIS — E860 Dehydration: Secondary | ICD-10-CM | POA: Insufficient documentation

## 2015-01-09 DIAGNOSIS — O4703 False labor before 37 completed weeks of gestation, third trimester: Secondary | ICD-10-CM

## 2015-01-09 DIAGNOSIS — R102 Pelvic and perineal pain: Secondary | ICD-10-CM | POA: Diagnosis not present

## 2015-01-09 DIAGNOSIS — M549 Dorsalgia, unspecified: Secondary | ICD-10-CM

## 2015-01-09 DIAGNOSIS — O26893 Other specified pregnancy related conditions, third trimester: Secondary | ICD-10-CM | POA: Diagnosis not present

## 2015-01-09 DIAGNOSIS — O99891 Other specified diseases and conditions complicating pregnancy: Secondary | ICD-10-CM

## 2015-01-09 DIAGNOSIS — Z3A31 31 weeks gestation of pregnancy: Secondary | ICD-10-CM | POA: Insufficient documentation

## 2015-01-09 DIAGNOSIS — O9989 Other specified diseases and conditions complicating pregnancy, childbirth and the puerperium: Secondary | ICD-10-CM

## 2015-01-09 HISTORY — DX: Gestational (pregnancy-induced) hypertension without significant proteinuria, unspecified trimester: O13.9

## 2015-01-09 LAB — URINALYSIS, ROUTINE W REFLEX MICROSCOPIC
Bilirubin Urine: NEGATIVE
Glucose, UA: NEGATIVE mg/dL
Hgb urine dipstick: NEGATIVE
KETONES UR: 15 mg/dL — AB
NITRITE: NEGATIVE
PH: 7 (ref 5.0–8.0)
Protein, ur: NEGATIVE mg/dL
SPECIFIC GRAVITY, URINE: 1.025 (ref 1.005–1.030)
Urobilinogen, UA: 2 mg/dL — ABNORMAL HIGH (ref 0.0–1.0)

## 2015-01-09 LAB — URINE MICROSCOPIC-ADD ON

## 2015-01-09 LAB — AMNISURE RUPTURE OF MEMBRANE (ROM) NOT AT ARMC: AMNISURE: NEGATIVE

## 2015-01-09 NOTE — MAU Note (Signed)
Pt came in EMS, woke up @ 0430 - lower back pain, pelvic pressure & round ligament pain, vagina feels swollen.  Has trickling of fluid, clear.  Noted some spotting when wiping this a.m.

## 2015-01-09 NOTE — Discharge Instructions (Signed)
Instructions:  1.  Drink more water.  Your urine shows mild dehydration today. 2.  Rest more in the next 2-3 days 3.  Keep your next appointment in OB clinic 4.  Return to MAU as needed for emergencies  Back Pain in Pregnancy Back pain during pregnancy is common. It happens in about half of all pregnancies. It is important for you and your baby that you remain active during your pregnancy.If you feel that back pain is not allowing you to remain active or sleep well, it is time to see your caregiver. Back pain may be caused by several factors related to changes during your pregnancy.Fortunately, unless you had trouble with your back before your pregnancy, the pain is likely to get better after you deliver. Low back pain usually occurs between the fifth and seventh months of pregnancy. It can, however, happen in the first couple months. Factors that increase the risk of back problems include:   Previous back problems.  Injury to your back.  Having twins or multiple births.  A chronic cough.  Stress.  Job-related repetitive motions.  Muscle or spinal disease in the back.  Family history of back problems, ruptured (herniated) discs, or osteoporosis.  Depression, anxiety, and panic attacks. CAUSES   When you are pregnant, your body produces a hormone called relaxin. This hormonemakes the ligaments connecting the low back and pubic bones more flexible. This flexibility allows the baby to be delivered more easily. When your ligaments are loose, your muscles need to work harder to support your back. Soreness in your back can come from tired muscles. Soreness can also come from back tissues that are irritated since they are receiving less support.  As the baby grows, it puts pressure on the nerves and blood vessels in your pelvis. This can cause back pain.  As the baby grows and gets heavier during pregnancy, the uterus pushes the stomach muscles forward and changes your center of  gravity. This makes your back muscles work harder to maintain good posture. SYMPTOMS  Lumbar pain during pregnancy Lumbar pain during pregnancy usually occurs at or above the waist in the center of the back. There may be pain and numbness that radiates into your leg or foot. This is similar to low back pain experienced by non-pregnant women. It usually increases with sitting for long periods of time, standing, or repetitive lifting. Tenderness may also be present in the muscles along your upper back. Posterior pelvic pain during pregnancy Pain in the back of the pelvis is more common than lumbar pain in pregnancy. It is a deep pain felt in your side at the waistline, or across the tailbone (sacrum), or in both places. You may have pain on one or both sides. This pain can also go into the buttocks and backs of the upper thighs. Pubic and groin pain may also be present. The pain does not quickly resolve with rest, and morning stiffness may also be present. Pelvic pain during pregnancy can be brought on by most activities. A high level of fitness before and during pregnancy may or may not prevent this problem. Labor pain is usually 1 to 2 minutes apart, lasts for about 1 minute, and involves a bearing down feeling or pressure in your pelvis. However, if you are at term with the pregnancy, constant low back pain can be the beginning of early labor, and you should be aware of this. DIAGNOSIS  X-rays of the back should not be done during the first 12  to 14 weeks of the pregnancy and only when absolutely necessary during the rest of the pregnancy. MRIs do not give off radiation and are safe during pregnancy. MRIs also should only be done when absolutely necessary. HOME CARE INSTRUCTIONS  Exercise as directed by your caregiver. Exercise is the most effective way to prevent or manage back pain. If you have a back problem, it is especially important to avoid sports that require sudden body movements. Swimming and  walking are great activities.  Do not stand in one place for long periods of time.  Do not wear high heels.  Sit in chairs with good posture. Use a pillow on your lower back if necessary. Make sure your head rests over your shoulders and is not hanging forward.  Try sleeping on your side, preferably the left side, with a pillow or two between your legs. If you are sore after a night's rest, your bedmay betoo soft.Try placing a board between your mattress and box spring.  Listen to your body when lifting.If you are experiencing pain, ask for help or try bending yourknees more so you can use your leg muscles rather than your back muscles. Squat down when picking up something from the floor. Do not bend over.  Eat a healthy diet. Try to gain weight within your caregiver's recommendations.  Use heat or cold packs 3 to 4 times a day for 15 minutes to help with the pain.  Only take over-the-counter or prescription medicines for pain, discomfort, or fever as directed by your caregiver. Sudden (acute) back pain  Use bed rest for only the most extreme, acute episodes of back pain. Prolonged bed rest over 48 hours will aggravate your condition.  Ice is very effective for acute conditions.  Put ice in a plastic bag.  Place a towel between your skin and the bag.  Leave the ice on for 10 to 20 minutes every 2 hours, or as needed.  Using heat packs for 30 minutes prior to activities is also helpful. Continued back pain See your caregiver if you have continued problems. Your caregiver can help or refer you for appropriate physical therapy. With conditioning, most back problems can be avoided. Sometimes, a more serious issue may be the cause of back pain. You should be seen right away if new problems seem to be developing. Your caregiver may recommend:  A maternity girdle.  An elastic sling.  A back brace.  A massage therapist or acupuncture. SEEK MEDICAL CARE IF:   You are not able to  do most of your daily activities, even when taking the pain medicine you were given.  You need a referral to a physical therapist or chiropractor.  You want to try acupuncture. SEEK IMMEDIATE MEDICAL CARE IF:  You develop numbness, tingling, weakness, or problems with the use of your arms or legs.  You develop severe back pain that is no longer relieved with medicines.  You have a sudden change in bowel or bladder control.  You have increasing pain in other areas of the body.  You develop shortness of breath, dizziness, or fainting.  You develop nausea, vomiting, or sweating.  You have back pain which is similar to labor pains.  You have back pain along with your water breaking or vaginal bleeding.  You have back pain or numbness that travels down your leg.  Your back pain developed after you fell.  You develop pain on one side of your back. You may have a kidney stone.  You see blood in your urine. You may have a bladder infection or kidney stone.  You have back pain with blisters. You may have shingles. Back pain is fairly common during pregnancy but should not be accepted as just part of the process. Back pain should always be treated as soon as possible. This will make your pregnancy as pleasant as possible.   This information is not intended to replace advice given to you by your health care provider. Make sure you discuss any questions you have with your health care provider.   Document Released: 05/29/2005 Document Revised: 05/13/2011 Document Reviewed: 07/10/2010 Elsevier Interactive Patient Education 2016 Elsevier [employer] Abdominal Pain During Pregnancy Abdominal pain is common in pregnancy. Most of the time, it does not cause harm. There are many causes of abdominal pain. Some causes are more serious than others. Some of the causes of abdominal pain in pregnancy are easily diagnosed. Occasionally, the diagnosis takes time to understand. Other times, the cause is not  determined. Abdominal pain can be a sign that something is very wrong with the pregnancy, or the pain may have nothing to do with the pregnancy at all. For this reason, always tell your health care provider if you have any abdominal discomfort. HOME CARE INSTRUCTIONS  Monitor your abdominal pain for any changes. The following actions may help to alleviate any discomfort you are experiencing:  Do not have sexual intercourse or put anything in your vagina until your symptoms go away completely.  Get plenty of rest until your pain improves.  Drink clear fluids if you feel nauseous. Avoid solid food as long as you are uncomfortable or nauseous.  Only take over-the-counter or prescription medicine as directed by your health care provider.  Keep all follow-up appointments with your health care provider. SEEK IMMEDIATE MEDICAL CARE IF:  You are bleeding, leaking fluid, or passing tissue from the vagina.  You have increasing pain or cramping.  You have persistent vomiting.  You have painful or bloody urination.  You have a fever.  You notice a decrease in your baby's movements.  You have extreme weakness or feel faint.  You have shortness of breath, with or without abdominal pain.  You develop a severe headache with abdominal pain.  You have abnormal vaginal discharge with abdominal pain.  You have persistent diarrhea.  You have abdominal pain that continues even after rest, or gets worse. MAKE SURE YOU:   Understand these instructions.  Will watch your condition.  Will get help right away if you are not doing well or get worse.   This information is not intended to replace advice given to you by your health care provider. Make sure you discuss any questions you have with your health care provider.   Document Released: 02/18/2005 Document Revised: 12/09/2012 Document Reviewed: 09/17/2012 Elsevier Interactive Patient Education [employer].

## 2015-01-09 NOTE — MAU Provider Note (Signed)
Chief Complaint:  Abdominal Pain and Rupture of Membranes   First Provider Initiated Contact with Patient 01/09/15 1123      HPI: Mackenzie Gibson is a 29 y.o. [id number] at [redacted]w[redacted]d who presents to maternity admissions via EMS reporting pelvic pressure, back pain, and leakage of clear fluid starting at 4 am this morning. She also reports light spotting when she wiped this morning x 1 episode. She reports her round ligament pain continues but this pain is worse and more in her back. She reports the pain is intermittent and irregularly timed. She has not taken anything for pain.  The leakage of fluid is clear, enough to wet her underwear but not require a pad. She reports she walked a lot more than usual yesterday. Last intercourse was 3 days ago.  She states she is drinking enough water.   She reports good fetal movement, denies vaginal itching/burning, urinary symptoms, h/a, dizziness, n/v, or fever/chills.    Abdominal Cramping This is a new problem. The current episode started today. The onset quality is sudden. The problem occurs intermittently. The problem has been waxing and waning. The pain is located in the generalized abdominal region. The pain is moderate. The quality of the pain is cramping. The abdominal pain radiates to the back. Pertinent negatives include no constipation, diarrhea, dysuria, fever, frequency, headaches, nausea or vomiting. The pain is aggravated by movement. The pain is relieved by nothing. She has tried nothing for the symptoms.  Vaginal Bleeding The patient's primary symptoms include pelvic pain, vaginal bleeding and vaginal discharge. This is a new problem. The current episode started today. The problem occurs rarely. The problem has been resolved. The pain is moderate. The problem affects both sides. She is pregnant. Associated symptoms include abdominal pain and back pain. Pertinent negatives include no chills, constipation, diarrhea, dysuria, fever, flank pain, frequency,  headaches, nausea, urgency or vomiting. The vaginal discharge was clear and watery. The vaginal bleeding is spotting. She has not been passing clots. She has not been passing tissue. Nothing aggravates the symptoms. She has tried nothing for the symptoms.    Past Medical History: Past Medical History  Diagnosis Date  . PCOS (polycystic ovarian syndrome)   . Depression   . Bipolar disorder ([place])   . Bipolar disorder ([place])   . Anxiety   . Pregnancy induced hypertension     Past obstetric history: OB History  Gravida Para Term Preterm AB SAB TAB Ectopic Multiple Living  6 3 3  0 1 1 0 0 0 3    # Outcome Date GA Lbr Len/2nd Weight Sex Delivery Anes PTL Lv  6 Current           5 Gravida           4 Term         Y  3 SAB  [redacted]w[redacted]d       FD  2 Term         Y  1 Term         Y      Past Surgical History: Past Surgical History  Procedure Laterality Date  . Cholecystectomy    . Eye surgery      Family History: Family History  Problem Relation Age of Onset  . Depression Mother   . Depression Father     Social History: Social History  Substance Use Topics  . Smoking status: Current Every Day Smoker -- 0.30 packs/day    Types: Cigarettes  . Smokeless  tobacco: None  . Alcohol Use: No    Allergies:  Allergies  Allergen Reactions  . Hydrocodone Anaphylaxis and Other (See Comments)     Tongue swelling    Meds:  No prescriptions prior to admission    ROS:  Review of Systems  Constitutional: Negative for fever, chills and fatigue.  HENT: Negative for sinus pressure.   Eyes: Negative for photophobia.  Respiratory: Negative for shortness of breath.   Cardiovascular: Negative for chest pain.  Gastrointestinal: Positive for abdominal pain. Negative for nausea, vomiting, diarrhea and constipation.  Genitourinary: Positive for vaginal bleeding, vaginal discharge and pelvic pain. Negative for dysuria, urgency, frequency, flank pain, difficulty urinating and vaginal pain.   Musculoskeletal: Positive for back pain. Negative for neck pain.  Neurological: Negative for dizziness, weakness and headaches.  Psychiatric/Behavioral: Negative.      I have reviewed patient's Past Medical Hx, Surgical Hx, Family Hx, Social Hx, medications and allergies.   Physical Exam   Patient Vitals for the past 24 hrs:  BP Temp Temp src Pulse Resp  01/09/15 1133 123/80 mmHg - - 90 18  01/09/15 0932 - 99.2 F (37.3 C) Oral - -  01/09/15 0914 126/78 mmHg 97.8 F (36.6 C) Oral 95 18   Constitutional: Well-developed, well-nourished female in no acute distress.  Cardiovascular: normal rate Respiratory: normal effort GI: Abd soft, non-tender, gravid appropriate for gestational age.  MS: Extremities nontender, no edema, normal ROM Neurologic: Alert and oriented x 4.  GU: Neg CVAT.  PELVIC EXAM: Cervix pink, visually closed, without lesion, scant white creamy discharge, no blood or pooling of fluid in vault, vaginal walls normal, with noted erythema at introitus with small area of irritation and dried old blood  Bimanual exam: Cervix 0/long/high, firm, anterior, neg CMT, uterus nontender, nonenlarged, adnexa without tenderness, enlargement, or mass  Dilation: Closed Effacement (%): Thick Exam by:: L. Leftwich-Kirby CNM  FHT:  Baseline 135, moderate variability, accelerations present, no decelerations Contractions: None on toco or to palpation   Labs: Results for orders placed or performed during the hospital encounter of 01/09/15 (from the past 24 hour(s))  Urinalysis, Routine w reflex microscopic (not at [facility])     Status: Abnormal   Collection Time: 01/09/15  9:29 AM  Result Value Ref Range   Color, Urine YELLOW YELLOW   APPearance HAZY (A) CLEAR   Specific Gravity, Urine 1.025 1.005 - 1.030   pH 7.0 5.0 - 8.0   Glucose, UA NEGATIVE NEGATIVE mg/dL   Hgb urine dipstick NEGATIVE NEGATIVE   Bilirubin Urine NEGATIVE NEGATIVE   Ketones, ur 15 (A) NEGATIVE mg/dL   Protein,  ur NEGATIVE NEGATIVE mg/dL   Urobilinogen, UA 2.0 (H) 0.0 - 1.0 mg/dL   Nitrite NEGATIVE NEGATIVE   Leukocytes, UA TRACE (A) NEGATIVE  Urine microscopic-add on     Status: Abnormal   Collection Time: 01/09/15  9:29 AM  Result Value Ref Range   Squamous Epithelial / LPF MANY (A) RARE   WBC, UA 0-2 <3 WBC/hpf   Bacteria, UA FEW (A) RARE   Urine-Other MUCOUS PRESENT   Amnisure rupture of membrane (rom)not at [facility]     Status: None   Collection Time: 01/09/15 10:25 AM  Result Value Ref Range   Amnisure ROM NEGATIVE    --/--/A POS (04/28 1635)  Imaging:  [name] [facility] Ob Follow Up  12/29/2014  OBSTETRICAL ULTRASOUND: This exam was performed within a [facility]. The OB US report was generated in the [facility] system, and  faxed to the ordering physician.  This report is available in the [facility]. See the [facility] Obstetric US report via the Image Link.   MAU Course/MDM: I have ordered labs and reviewed results.  With closed cervix and negative pooling and amnisure, no signs of preterm labor or rupture of membranes.  Bleeding appears to be from external vaginal irritation, with no signs of infection may be from increased activity yesterday.  Likely pain is normal pelvic pressure with multiparous pt, increased with pt increased activity.  Pt also mildly dehydrated with 15 ketones and high specific gravity in U/A.  Encouraged pt to increase PO fluids, rest for next 2-3 days, and f/u in clinic as scheduled. Urine sent for culture. Pt stable at time of discharge.  Assessment: 1. Pelvic pain affecting pregnancy in third trimester, antepartum   2. Back pain affecting pregnancy in third trimester   3. Threatened preterm labor, third trimester     Plan: Discharge home Preterm labor precautions and fetal kick counts Increase PO fluids      Follow-up Information    Follow up with [facility].   Specialty:  Obstetrics and Gynecology   Why:  As scheduled, Return to MAU as  needed for emergencies   Contact information:   [place] [phone]       Medication List    STOP taking these medications        docusate sodium 100 MG capsule  Commonly known as:  COLACE     polyethylene glycol powder powder  Commonly known as:  GLYCOLAX/MIRALAX     Psyllium 55.46 % Powd  Commonly known as:  METAMUCIL MULTIHEALTH FIBER      TAKE these medications        acetaminophen 500 MG tablet  Commonly known as:  TYLENOL  Take 250 mg by mouth every 6 (six) hours as needed for mild pain or headache.     albuterol 108 (90 BASE) MCG/ACT inhaler  Commonly known as:  PROVENTIL HFA;VENTOLIN HFA  Inhale 1-2 puffs into the lungs every 6 (six) hours as needed for wheezing or shortness of breath.     benzocaine-resorcinol 5-2 % vaginal cream  Commonly known as:  VAGISIL  Place 1 application vaginally 2 (two) times daily as needed for itching.        Fatima Blank Certified Nurse-Midwife 01/09/2015 11:41 AM

## 2015-01-10 LAB — CULTURE, OB URINE

## 2015-01-19 ENCOUNTER — Ambulatory Visit (INDEPENDENT_AMBULATORY_CARE_PROVIDER_SITE_OTHER): Payer: Medicaid Other | Admitting: Obstetrics & Gynecology

## 2015-01-19 ENCOUNTER — Other Ambulatory Visit: Payer: Self-pay | Admitting: Obstetrics & Gynecology

## 2015-01-19 ENCOUNTER — Ambulatory Visit (HOSPITAL_COMMUNITY)
Admission: RE | Admit: 2015-01-19 | Discharge: 2015-01-19 | Disposition: A | Discharge status: Home / Self Care | Payer: Medicaid Other | Source: Ambulatory Visit | Attending: Obstetrics & Gynecology | Admitting: Obstetrics & Gynecology

## 2015-01-19 VITALS — BP 128/74 | HR 84 | Temp 97.9°F | Wt 263.0 lb

## 2015-01-19 DIAGNOSIS — O26899 Other specified pregnancy related conditions, unspecified trimester: Secondary | ICD-10-CM | POA: Diagnosis not present

## 2015-01-19 DIAGNOSIS — O10913 Unspecified pre-existing hypertension complicating pregnancy, third trimester: Secondary | ICD-10-CM

## 2015-01-19 DIAGNOSIS — N949 Unspecified condition associated with female genital organs and menstrual cycle: Secondary | ICD-10-CM

## 2015-01-19 DIAGNOSIS — O9989 Other specified diseases and conditions complicating pregnancy, childbirth and the puerperium: Secondary | ICD-10-CM

## 2015-01-19 DIAGNOSIS — Z3A33 33 weeks gestation of pregnancy: Secondary | ICD-10-CM

## 2015-01-19 DIAGNOSIS — G8929 Other chronic pain: Secondary | ICD-10-CM

## 2015-01-19 DIAGNOSIS — O10013 Pre-existing essential hypertension complicating pregnancy, third trimester: Secondary | ICD-10-CM | POA: Diagnosis present

## 2015-01-19 DIAGNOSIS — O99213 Obesity complicating pregnancy, third trimester: Secondary | ICD-10-CM

## 2015-01-19 DIAGNOSIS — Z3483 Encounter for supervision of other normal pregnancy, third trimester: Secondary | ICD-10-CM

## 2015-01-19 DIAGNOSIS — J45909 Unspecified asthma, uncomplicated: Secondary | ICD-10-CM | POA: Diagnosis not present

## 2015-01-19 DIAGNOSIS — R102 Pelvic and perineal pain: Secondary | ICD-10-CM

## 2015-01-19 DIAGNOSIS — I10 Essential (primary) hypertension: Secondary | ICD-10-CM | POA: Diagnosis not present

## 2015-01-19 DIAGNOSIS — O99519 Diseases of the respiratory system complicating pregnancy, unspecified trimester: Secondary | ICD-10-CM

## 2015-01-19 LAB — POCT URINALYSIS DIP (DEVICE)
Glucose, UA: NEGATIVE mg/dL
HGB URINE DIPSTICK: NEGATIVE
Ketones, ur: NEGATIVE mg/dL
NITRITE: NEGATIVE
Protein, ur: NEGATIVE mg/dL
Specific Gravity, Urine: >=1.03 (ref 1.005–1.030)
Urobilinogen, UA: 1 mg/dL (ref 0.0–1.0)
pH: 6.5 (ref 5.0–8.0)

## 2015-01-19 MED ORDER — CYCLOBENZAPRINE HCL 10 MG PO TABS: 10.0000 mg | ORAL_TABLET | Freq: Three times a day (TID) | ORAL | Status: DC | PRN

## 2015-01-19 MED ORDER — ALBUTEROL SULFATE HFA 108 (90 BASE) MCG/ACT IN AERS: 1.0000 | INHALATION_SPRAY | Freq: Four times a day (QID) | RESPIRATORY_TRACT | Status: DC | PRN

## 2015-01-19 MED ORDER — ACETAMINOPHEN 500 MG PO TABS: 1000.0000 mg | ORAL_TABLET | Freq: Four times a day (QID) | ORAL | Status: DC | PRN

## 2015-01-19 NOTE — Progress Notes (Signed)
Subjective:  Mackenzie Gibson is a 29 y.o. [phone] at [redacted]w[redacted]d being seen today for ongoing prenatal care.  Patient reports pelvic pain, wants medication as this is not alleviated by Tylenol 500 mg po bid.  Contractions: Not present.  Vag. Bleeding: None. Movement: Present. Denies leaking of fluid.   The following portions of the patient's history were reviewed and updated as appropriate: allergies, current medications, past family history, past medical history, past social history, past surgical history and problem list. Problem list updated.  Objective:   Filed Vitals:   01/19/15 1019  BP: 128/74  Pulse: 84  Temp: 97.9 F (36.6 C)  Weight: 263 lb (119.296 kg)    Fetal Status: Fetal Heart Rate (bpm): 130 Fundal Height: 35 cm Movement: Present     General:  Alert, oriented and cooperative. Patient is in no acute distress.  Skin: Skin is warm and dry. No rash noted.   Cardiovascular: Normal heart rate noted  Respiratory: Normal respiratory effort, no problems with respiration noted  Abdomen: Soft, gravid, appropriate for gestational age. Pain/Pressure: Present     Pelvic: Vag. Bleeding: None     Cervical exam deferred        Extremities: Normal range of motion.  Edema: None  Mental Status: Normal mood and affect. Normal behavior. Normal judgment and thought content.   Urinalysis: Urine Protein: Negative Urine Glucose: Negative NST performed today was reviewed and was found to be reactive.  Continue recommended antenatal testing and prenatal care.  Assessment and Plan:  Pregnancy: [id number] at [redacted]w[redacted]d  1. Chronic hypertension complicating or reason for care during pregnancy, third trimester Needs to start antenatal testing today.  Patient reports she is unable to come twice a week as she comes from [place]. As such, we will do weekly OB visit, NST and BPP all on same day for antenatal monitoring.  Will start today.  BPP to be added onto growth scan scheduled on 01/27/15, will also get NST at  [facility] on 01/27/15 as clinic is closed. - [name] [facility] FETAL BPP W/NONSTRESS; Future - [name] [facility] FETAL BPP WO NON STRESS; Future  2. Chronic pelvic pain in female 3. Pelvic pain affecting pregnancy, antepartum Will do trial of Flexeril, continue Tylenol at appropriate dosage - cyclobenzaprine (FLEXERIL) 10 MG tablet; Take 1 tablet (10 mg total) by mouth every 8 (eight) hours as needed for muscle spasms.  Dispense: 30 tablet; Refill: 1 - acetaminophen (TYLENOL) 500 MG tablet; Take 2 tablets (1,000 mg total) by mouth every 6 (six) hours as needed for mild pain or headache.  Dispense: 60 tablet; Refill: 3  4. Asthma affecting pregnancy, antepartum Refill ordered as per her request - albuterol (PROVENTIL HFA;VENTOLIN HFA) 108 (90 BASE) MCG/ACT inhaler; Inhale 1-2 puffs into the lungs every 6 (six) hours as needed for wheezing or shortness of breath.  Dispense: 18 g; Refill: 5  Preterm labor symptoms and general obstetric precautions including but not limited to vaginal bleeding, contractions, leaking of fluid and fetal movement were reviewed in detail with the patient. Please refer to After Visit Summary for other counseling recommendations.  Return in about 2 weeks (around 02/02/2015) for OB Visit.   Osborne Oman, MD

## 2015-01-19 NOTE — Progress Notes (Signed)
Patient reports not sleeping due to severe pain at night in her pelvis, hips and lower back. Requests something for pain because tylenol isn't helping for the severe pain.  Requests refill on inhaler as bronchitis is acting up Patient states she cannot handle the pain much longer or she might have to self induce

## 2015-01-19 NOTE — Patient Instructions (Signed)
Return to clinic for any obstetric concerns or go to MAU for evaluation  

## 2015-01-20 ENCOUNTER — Other Ambulatory Visit: Payer: Self-pay | Admitting: Obstetrics & Gynecology

## 2015-01-20 DIAGNOSIS — Z3A33 33 weeks gestation of pregnancy: Secondary | ICD-10-CM

## 2015-01-20 DIAGNOSIS — O10913 Unspecified pre-existing hypertension complicating pregnancy, third trimester: Secondary | ICD-10-CM

## 2015-01-20 DIAGNOSIS — O99213 Obesity complicating pregnancy, third trimester: Secondary | ICD-10-CM

## 2015-01-27 ENCOUNTER — Encounter (HOSPITAL_COMMUNITY): Payer: Self-pay

## 2015-01-27 ENCOUNTER — Other Ambulatory Visit (HOSPITAL_COMMUNITY): Payer: Self-pay | Admitting: Maternal and Fetal Medicine

## 2015-01-27 ENCOUNTER — Ambulatory Visit (HOSPITAL_COMMUNITY)
Admission: RE | Admit: 2015-01-27 | Discharge: 2015-01-27 | Disposition: A | Discharge status: Home / Self Care | Payer: Medicaid Other | Source: Ambulatory Visit | Attending: Family | Admitting: Family

## 2015-01-27 ENCOUNTER — Other Ambulatory Visit: Payer: Self-pay | Admitting: Obstetrics & Gynecology

## 2015-01-27 DIAGNOSIS — Z3A34 34 weeks gestation of pregnancy: Secondary | ICD-10-CM

## 2015-01-27 DIAGNOSIS — O99213 Obesity complicating pregnancy, third trimester: Secondary | ICD-10-CM

## 2015-01-27 DIAGNOSIS — O10919 Unspecified pre-existing hypertension complicating pregnancy, unspecified trimester: Secondary | ICD-10-CM

## 2015-01-27 DIAGNOSIS — O10013 Pre-existing essential hypertension complicating pregnancy, third trimester: Secondary | ICD-10-CM | POA: Diagnosis not present

## 2015-01-27 DIAGNOSIS — O10913 Unspecified pre-existing hypertension complicating pregnancy, third trimester: Secondary | ICD-10-CM

## 2015-02-01 ENCOUNTER — Inpatient Hospital Stay (HOSPITAL_COMMUNITY)
Admission: AD | Admit: 2015-02-01 | Discharge: 2015-02-01 | Disposition: A | Discharge status: Home / Self Care | Payer: Medicaid Other | Source: Ambulatory Visit | Attending: Obstetrics & Gynecology | Admitting: Obstetrics & Gynecology

## 2015-02-01 ENCOUNTER — Telehealth: Payer: Self-pay | Admitting: General Practice

## 2015-02-01 DIAGNOSIS — F17210 Nicotine dependence, cigarettes, uncomplicated: Secondary | ICD-10-CM | POA: Diagnosis not present

## 2015-02-01 DIAGNOSIS — O479 False labor, unspecified: Secondary | ICD-10-CM

## 2015-02-01 DIAGNOSIS — O99333 Smoking (tobacco) complicating pregnancy, third trimester: Secondary | ICD-10-CM | POA: Insufficient documentation

## 2015-02-01 DIAGNOSIS — G8929 Other chronic pain: Secondary | ICD-10-CM | POA: Insufficient documentation

## 2015-02-01 DIAGNOSIS — M545 Low back pain: Secondary | ICD-10-CM | POA: Diagnosis not present

## 2015-02-01 DIAGNOSIS — O9989 Other specified diseases and conditions complicating pregnancy, childbirth and the puerperium: Secondary | ICD-10-CM

## 2015-02-01 DIAGNOSIS — O99891 Other specified diseases and conditions complicating pregnancy: Secondary | ICD-10-CM

## 2015-02-01 DIAGNOSIS — Z3A35 35 weeks gestation of pregnancy: Secondary | ICD-10-CM | POA: Diagnosis not present

## 2015-02-01 DIAGNOSIS — O4703 False labor before 37 completed weeks of gestation, third trimester: Secondary | ICD-10-CM

## 2015-02-01 DIAGNOSIS — O10913 Unspecified pre-existing hypertension complicating pregnancy, third trimester: Secondary | ICD-10-CM | POA: Diagnosis not present

## 2015-02-01 DIAGNOSIS — M549 Dorsalgia, unspecified: Secondary | ICD-10-CM

## 2015-02-01 LAB — URINALYSIS, ROUTINE W REFLEX MICROSCOPIC
Glucose, UA: NEGATIVE mg/dL
HGB URINE DIPSTICK: NEGATIVE
Ketones, ur: NEGATIVE mg/dL
Leukocytes, UA: NEGATIVE
Nitrite: NEGATIVE
PROTEIN: NEGATIVE mg/dL
SPECIFIC GRAVITY, URINE: 1.025 (ref 1.005–1.030)
pH: 6 (ref 5.0–8.0)

## 2015-02-01 NOTE — MAU Note (Signed)
States passed mucous plug this AM. C/O lower back pain and lower abdominal tightness. C/O occasional contractions. No bleeding or leaking.

## 2015-02-01 NOTE — Discharge Instructions (Signed)
Vaginal Delivery °During delivery, your health care provider will help you give birth to your baby. During a vaginal delivery, you will work to push the baby out of your vagina. However, before you can push your baby out, a few things need to happen. The opening of your uterus (cervix) has to soften, thin out, and open up (dilate) all the way to 10 cm. Also, your baby has to move down from the uterus into your vagina.  °SIGNS OF LABOR  °Your health care provider will first need to make sure you are in labor. Signs of labor include:  °· Passing what is called the mucous plug before labor begins. This is a small amount of blood-stained mucus. °· Having regular, painful uterine contractions.   °· The time between contractions gets shorter.   °· The discomfort and pain gradually get more intense. °· Contraction pains get worse when walking and do not go away when resting.   °· Your cervix becomes thinner (effacement) and dilates. °BEFORE THE DELIVERY °Once you are in labor and admitted into the hospital or care center, your health care provider may do the following:  °· Perform a complete physical exam. °· Review any complications related to pregnancy or labor.  °· Check your blood pressure, pulse, temperature, and heart rate (vital signs).   °· Determine if, and when, the rupture of amniotic membranes occurred. °· Do a vaginal exam (using a sterile glove and lubricant) to determine:   °¨ The position (presentation) of the baby. Is the baby's head presenting first (vertex) in the birth canal (vagina), or are the feet or buttocks first (breech)?   °¨ The level (station) of the baby's head within the birth canal.   °¨ The effacement and dilatation of the cervix.   °· An electronic fetal monitor is usually placed on your abdomen when you first arrive. This is used to monitor your contractions and the baby's heart rate. °¨ When the monitor is on your abdomen (external fetal monitor), it can only pick up the frequency and  length of your contractions. It cannot tell the strength of your contractions. °¨ If it becomes necessary for your health care provider to know exactly how strong your contractions are or to see exactly what the baby's heart rate is doing, an internal monitor may be inserted into your vagina and uterus. Your health care provider will discuss the benefits and risks of using an internal monitor and obtain your permission before inserting the device. °¨ Continuous fetal monitoring may be needed if you have an epidural, are receiving certain medicines (such as oxytocin), or have pregnancy or labor complications. °· An IV access tube may be placed into a vein in your arm to deliver fluids and medicines if necessary. °THREE STAGES OF LABOR AND DELIVERY °Normal labor and delivery is divided into three stages. °First Stage °This stage starts when you begin to contract regularly and your cervix begins to efface and dilate. It ends when your cervix is completely open (fully dilated). The first stage is the longest stage of labor and can last from 3 hours to 15 hours.  °Several methods are available to help with labor pain. You and your health care provider will decide which option is best for you. Options include:  °· Opioid medicines. These are strong pain medicines that you can get through your IV tube or as a shot into your muscle. These medicines lessen pain but do not make it go away completely.  °· Epidural. A medicine is given through a thin tube that   is inserted in your back. The medicine numbs the lower part of your body and prevents any pain in that area.  Paracervical pain medicine. This is an injection of an anesthetic on each side of your cervix.   You may request natural childbirth, which does not involve the use of pain medicines or an epidural during labor and delivery. Instead, you will use other things, such as breathing exercises, to help cope with the pain. Second Stage The second stage of labor  begins when your cervix is fully dilated at 10 cm. It continues until you push your baby down through the birth canal and the baby is born. This stage can take only minutes or several hours.  The location of your baby's head as it moves through the birth canal is reported as a number called a station. If the baby's head has not started its descent, the station is described as being at minus 3 (-3). When your baby's head is at the zero station, it is at the middle of the birth canal and is engaged in the pelvis. The station of your baby helps indicate the progress of the second stage of labor.  When your baby is born, your health care provider may hold the baby with his or her head lowered to prevent amniotic fluid, mucus, and blood from getting into the baby's lungs. The baby's mouth and nose may be suctioned with a small bulb syringe to remove any additional fluid.  Your health care provider may then place the baby on your stomach. It is important to keep the baby from getting cold. To do this, the health care provider will dry the baby off, place the baby directly on your skin (with no blankets between you and the baby), and cover the baby with warm, dry blankets.   The umbilical cord is cut. Third Stage During the third stage of labor, your health care provider will deliver the placenta (afterbirth) and make sure your bleeding is under control. The delivery of the placenta usually takes about 5 minutes but can take up to 30 minutes. After the placenta is delivered, a medicine may be given either by IV or injection to help contract the uterus and control bleeding. If you are planning to breastfeed, you can try to do so now. After you deliver the placenta, your uterus should contract and get very firm. If your uterus does not remain firm, your health care provider will massage it. This is important because the contraction of the uterus helps cut off bleeding at the site where the placenta was attached  to your uterus. If your uterus does not contract properly and stay firm, you may continue to bleed heavily. If there is a lot of bleeding, medicines may be given to contract the uterus and stop the bleeding.    This information is not intended to replace advice given to you by your health care provider. Make sure you discuss any questions you have with your health care provider.   Document Released: 11/28/2007 Document Revised: 03/11/2014 Document Reviewed: 10/16/2011 Elsevier Interactive Patient Education 2016 [employer]. Preterm Labor Information Preterm labor is when labor starts at less than 37 weeks of pregnancy. The normal length of a pregnancy is 39 to 41 weeks. CAUSES Often, there is no identifiable underlying cause as to why a woman goes into preterm labor. One of the most common known causes of preterm labor is infection. Infections of the uterus, cervix, vagina, amniotic sac, bladder, kidney, or even  the lungs (pneumonia) can cause labor to start. Other suspected causes of preterm labor include:   Urogenital infections, such as yeast infections and bacterial vaginosis.   Uterine abnormalities (uterine shape, uterine septum, fibroids, or bleeding from the placenta).   A cervix that has been operated on (it may fail to stay closed).   Malformations in the fetus.   Multiple gestations (twins, triplets, and so on).   Breakage of the amniotic sac.  RISK FACTORS  Having a previous history of preterm labor.   Having premature rupture of membranes (PROM).   Having a placenta that covers the opening of the cervix (placenta previa).   Having a placenta that separates from the uterus (placental abruption).   Having a cervix that is too weak to hold the fetus in the uterus (incompetent cervix).   Having too much fluid in the amniotic sac (polyhydramnios).   Taking illegal drugs or smoking while pregnant.   Not gaining enough weight while pregnant.   Being  younger than 28 and older than 30 years old.   Having a low socioeconomic status.   Being African American. SYMPTOMS Signs and symptoms of preterm labor include:   Menstrual-like cramps, abdominal pain, or back pain.  Uterine contractions that are regular, as frequent as six in an hour, regardless of their intensity (may be mild or painful).  Contractions that start on the top of the uterus and spread down to the lower abdomen and back.   A sense of increased pelvic pressure.   A watery or bloody mucus discharge that comes from the vagina.  TREATMENT Depending on the length of the pregnancy and other circumstances, your health care provider may suggest bed rest. If necessary, there are medicines that can be given to stop contractions and to mature the fetal lungs. If labor happens before 34 weeks of pregnancy, a prolonged hospital stay may be recommended. Treatment depends on the condition of both you and the fetus.  WHAT SHOULD YOU DO IF YOU THINK YOU ARE IN PRETERM LABOR? Call your health care provider right away. You will need to go to the hospital to get checked immediately. HOW CAN YOU PREVENT PRETERM LABOR IN FUTURE PREGNANCIES? You should:   Stop smoking if you smoke.  Maintain healthy weight gain and avoid chemicals and drugs that are not necessary.  Be watchful for any type of infection.  Inform your health care provider if you have a known history of preterm labor.   This information is not intended to replace advice given to you by your health care provider. Make sure you discuss any questions you have with your health care provider.   Document Released: 05/11/2003 Document Revised: 10/21/2012 Document Reviewed: 03/23/2012 Elsevier Interactive Patient Education [employer].

## 2015-02-01 NOTE — MAU Provider Note (Signed)
Chief Complaint:  Contractions   First Provider Initiated Contact with Patient 02/01/15 Mackenzie Gibson is a 29 y.o. [phone] at [redacted]w[redacted]d who presents to maternity admissions reporting contractions every 7 min, back pain and loss of mucous plug this morning.  States has been 1cm in office. She reports good fetal movement, denies LOF, vaginal bleeding, vaginal itching/burning, urinary symptoms, h/a, dizziness, n/v, or fever/chills.  She denies headache or visual changes.  Pregnancy has [facility] at [facility] and remarkable for history of hypertension, chronic pelvic pain, Bipolar/panic disorder, and tobacco use   Abdominal Cramping This is a recurrent problem. The current episode started today. The onset quality is gradual. The problem occurs intermittently. The problem has been unchanged. The pain is located in the suprapubic region. The pain is mild. The quality of the pain is cramping. The abdominal pain radiates to the back. Pertinent negatives include no dysuria, fever, myalgias, nausea or vomiting. Nothing aggravates the pain. The pain is relieved by nothing. She has tried nothing for the symptoms.   Past Medical History: Past Medical History  Diagnosis Date  . PCOS (polycystic ovarian syndrome)   . Depression   . Bipolar disorder ([place])   . Bipolar disorder ([place])   . Anxiety   . Pregnancy induced hypertension     Past obstetric history: OB History  Gravida Para Term Preterm AB SAB TAB Ectopic Multiple Living  5 3 3  0 1 1 0 0 0 3    # Outcome Date GA Lbr Len/2nd Weight Sex Delivery Anes PTL Lv  5 Current           4 Term         Y  3 SAB  [redacted]w[redacted]d       FD  2 Term         Y  1 Term         Y      Past Surgical History: Past Surgical History  Procedure Laterality Date  . Cholecystectomy    . Eye surgery      Family History: Family History  Problem Relation Age of Onset  . Depression Mother   . Depression Father     Social History: Social History   Substance Use Topics  . Smoking status: Current Every Day Smoker -- 0.30 packs/day    Types: Cigarettes  . Smokeless tobacco: Not on file  . Alcohol Use: No    Allergies:  Allergies  Allergen Reactions  . Hydrocodone Anaphylaxis and Other (See Comments)     Tongue swelling    Meds:  Prescriptions prior to admission  Medication Sig Dispense Refill Last Dose  . acetaminophen (TYLENOL) 500 MG tablet Take 2 tablets (1,000 mg total) by mouth every 6 (six) hours as needed for mild pain or headache. 60 tablet 3 Taking  . albuterol (PROVENTIL HFA;VENTOLIN HFA) 108 (90 BASE) MCG/ACT inhaler Inhale 1-2 puffs into the lungs every 6 (six) hours as needed for wheezing or shortness of breath. 18 g 5 Taking  . benzocaine-resorcinol (VAGISIL) 5-2 % vaginal cream Place 1 application vaginally 2 (two) times daily as needed for itching.   Taking  . cyclobenzaprine (FLEXERIL) 10 MG tablet Take 1 tablet (10 mg total) by mouth every 8 (eight) hours as needed for muscle spasms. 30 tablet 1 Taking    ROS:  Review of Systems  Constitutional: Negative for fever and chills.  Eyes: Negative for visual disturbance.  Respiratory: Negative for shortness  of breath.   Gastrointestinal: Positive for abdominal pain. Negative for nausea and vomiting.  Genitourinary: Negative for dysuria, flank pain, vaginal bleeding and vaginal discharge.  Musculoskeletal: Positive for back pain. Negative for myalgias.   I have reviewed patient's Past Medical Hx, Surgical Hx, Family Hx, Social Hx, medications and allergies.   Physical Exam  Patient Vitals for the past 24 hrs:  BP Temp Temp src Pulse Resp Height Weight  02/01/15 1317 136/84 mmHg 97.6 F (36.4 C) Oral 110 20 - -  02/01/15 1312 - - - - - 5\' 7"  (1.702 m) 118.389 kg (261 lb)   Constitutional: Well-developed, well-nourished female in no acute distress.  Cardiovascular: normal rate Respiratory: normal effort GI: Abd soft, non-tender, gravid appropriate for  gestational age.  MS: Extremities nontender, no edema, normal ROM Neurologic: Alert and oriented x 4.  GU: Neg CVAT.  Bimanual exam: Cervix 0/50%/high, soft, anterior, neg CMT, uterus nontender  Dilation: Closed Effacement (%): 50 Station: Ballotable Exam by:: Carmelia Roller, CNM  FHT:  Baseline 135 , moderate variability, accelerations present, no decelerations Contractions: q 2-5 mins Irregular and lasting only 10-15 seconds, c/w irritability   Labs: No results found for this or any previous visit (from the past 24 hour(s)). --/--/A POS (04/28 1635) Will send UA  Imaging: none  MAU Course/MDM: I have ordered UA to rule out UTI as a cause of back pain and irritability.    Pt stable at time of discharge.  Assessment: SIUP at [redacted]w[redacted]d  Uterine irritability, probably due to multiparous state Low back pain, chronic Reassuring fetal heart rate tracing  Plan: Discharge home Labor precautions and fetal kick counts reviewed in detail. Has appointment tomorrow [facility].    Medication List    ASK your doctor about these medications        acetaminophen 500 MG tablet  Commonly known as:  TYLENOL  Take 2 tablets (1,000 mg total) by mouth every 6 (six) hours as needed for mild pain or headache.     albuterol 108 (90 BASE) MCG/ACT inhaler  Commonly known as:  PROVENTIL HFA;VENTOLIN HFA  Inhale 1-2 puffs into the lungs every 6 (six) hours as needed for wheezing or shortness of breath.     benzocaine-resorcinol 5-2 % vaginal cream  Commonly known as:  VAGISIL  Place 1 application vaginally 2 (two) times daily as needed for itching.     cyclobenzaprine 10 MG tablet  Commonly known as:  FLEXERIL  Take 1 tablet (10 mg total) by mouth every 8 (eight) hours as needed for muscle spasms.        Hansel Feinstein CNM, MSN Certified Nurse-Midwife 02/01/2015 1:39 PM

## 2015-02-01 NOTE — Telephone Encounter (Signed)
Patient called into front office stating she lost her mucous plug this morning when she went to the bathroom. She has also been having contractions off and on this morning with constant lower back pain. Patient states she has an appt tomorrow & wants to know if she can come in today. Asked patient if she has taken anything for her back like flexeril. Patient states no she took it twice last week and she felt like it made her have contractions. Told patient that it is not a known reaction and recommended she take that for her back pain or extra strength tylenol. Told patient if her pain becomes severe or if she has contractions 4-5 minutes apart for longer than an hour to go to MAU. Also recommended drinking plenty of water & trying a heating pad as well. Patient verbalized understanding to all & had no questions

## 2015-02-02 ENCOUNTER — Ambulatory Visit (HOSPITAL_COMMUNITY)
Admission: RE | Admit: 2015-02-02 | Discharge: 2015-02-02 | Disposition: A | Discharge status: Home / Self Care | Payer: Medicaid Other | Source: Ambulatory Visit | Attending: Obstetrics & Gynecology | Admitting: Obstetrics & Gynecology

## 2015-02-02 ENCOUNTER — Other Ambulatory Visit: Payer: Self-pay | Admitting: Obstetrics & Gynecology

## 2015-02-02 ENCOUNTER — Ambulatory Visit (HOSPITAL_COMMUNITY): Payer: Medicaid Other

## 2015-02-02 ENCOUNTER — Ambulatory Visit (INDEPENDENT_AMBULATORY_CARE_PROVIDER_SITE_OTHER): Payer: Medicaid Other | Admitting: Obstetrics & Gynecology

## 2015-02-02 VITALS — BP 125/73 | HR 107 | Wt 260.0 lb

## 2015-02-02 DIAGNOSIS — O10913 Unspecified pre-existing hypertension complicating pregnancy, third trimester: Secondary | ICD-10-CM

## 2015-02-02 DIAGNOSIS — Z3A35 35 weeks gestation of pregnancy: Secondary | ICD-10-CM

## 2015-02-02 DIAGNOSIS — O10013 Pre-existing essential hypertension complicating pregnancy, third trimester: Secondary | ICD-10-CM | POA: Diagnosis not present

## 2015-02-02 DIAGNOSIS — O99213 Obesity complicating pregnancy, third trimester: Secondary | ICD-10-CM

## 2015-02-02 DIAGNOSIS — I10 Essential (primary) hypertension: Secondary | ICD-10-CM | POA: Diagnosis not present

## 2015-02-02 LAB — POCT URINALYSIS DIP (DEVICE)
Bilirubin Urine: NEGATIVE
Glucose, UA: NEGATIVE mg/dL
Hgb urine dipstick: NEGATIVE
Ketones, ur: NEGATIVE mg/dL
Nitrite: NEGATIVE
Protein, ur: NEGATIVE mg/dL
Specific Gravity, Urine: 1.02 (ref 1.005–1.030)
Urobilinogen, UA: 2 mg/dL — ABNORMAL HIGH (ref 0.0–1.0)
pH: 7 (ref 5.0–8.0)

## 2015-02-02 NOTE — Progress Notes (Signed)
Subjective:  Mackenzie Gibson is a 29 y.o. [phone] at [redacted]w[redacted]d being seen today for ongoing prenatal care.  She is currently monitored for the following issues for this high-risk pregnancy and has POLYCYSTIC OVARIAN DISEASE; PANIC DISORDER; SOMATIZATION DISORDER; TOBACCO USER; Eczema, dyshidrotic; Bipolar disorder ([place]); Chronic pelvic pain in female; Chronic hypertension complicating or reason for care during pregnancy; Exposure to STD; Hx of preeclampsia, prior pregnancy, currently pregnant; Maternal morbid obesity, antepartum ([place]); and Supervision of normal pregnancy, antepartum on her problem list.  Patient reports contractions since 11/30.  Contractions: Irregular. Vag. Bleeding: Scant.  Movement: Present. Denies leaking of fluid.   The following portions of the patient's history were reviewed and updated as appropriate: allergies, current medications, past family history, past medical history, past social history, past surgical history and problem list. Problem list updated.  Objective:   Filed Vitals:   02/02/15 0925  BP: 125/73  Pulse: 107  Weight: 260 lb (117.935 kg)    Fetal Status:     Movement: Present     General:  Alert, oriented and cooperative. Patient is in no acute distress.  Skin: Skin is warm and dry. No rash noted.   Cardiovascular: Normal heart rate noted  Respiratory: Normal respiratory effort, no problems with respiration noted  Abdomen: Soft, gravid, appropriate for gestational age. Pain/Pressure: Present     Pelvic: Vag. Bleeding: Scant     Cervical exam performed        Extremities: Normal range of motion.  Edema: None  Mental Status: Normal mood and affect. Normal behavior. Normal judgment and thought content.   Urinalysis: Urine Protein: Negative Urine Glucose: Negative  Assessment and Plan:  Pregnancy: [id number] at [redacted]w[redacted]d  1. Chronic hypertension complicating or reason for care during pregnancy, third trimester  - Fetal nonstress test - [name] MFM FETAL BPP  WO NON STRESS; Future  Preterm labor symptoms and general obstetric precautions including but not limited to vaginal bleeding, contractions, leaking of fluid and fetal movement were reviewed in detail with the patient. Please refer to After Visit Summary for other counseling recommendations.  Return in about 7 days (around 02/09/2015) for Ob f/u & NST.   Woodroe Mode, MD

## 2015-02-02 NOTE — Patient Instructions (Signed)
Early Elective Birth Early elective birth refers to making a choice to have a baby before the time the baby is due. The length of a pregnancy is 9 months, or 40 weeks, starting from the beginning of a woman's last menstrual period. Most women naturally go into labor around 40 weeks of gestation. A full-term pregnancy is considered between 37 weeks and 42 weeks of gestation. Currently, early elective births can take place sometime after 39 weeks of gestation. Most health care providers practice within the guidelines of delivering a baby no later than 42 weeks of gestation and no earlier than 39 weeks of gestation. There are exceptions to this time interval, and the risks involved to the mother and baby need to be considered in those cases.  Induction of labor refers to the use of medicines to bring aboutcontractions. Labor is when the cervix starts to widen (dilate). Active labor is when there are contractions and the cervix has dilated to at least 4 cm. Oftentimes, the earlier a mother is in her pregnancy, the longer it takes to get induced. When the cervix is ready (dilated and soft), an induction may take less than a day. However, when a cervix is far away from being ready (long, closed, and firm), it may take days in a hospital for labor to start.  Currently, 39 weeks of gestation is considered the earliest a health care providershould start the induction process. This is because the longer the baby stays inside the uterus, the lower the risks are to both the baby and mother. However, sometimes there are very good reasons for a pregnancy to be induced before 39 weeks of gestation. These exceptions are specific to each individual pregnancy and need to be considered on a case-by-case basis. A good reason to induce one pregnancy may not be a good reason for another pregnancy.  REASONS FOR ELECTIVE BIRTH It may be safer to induce labor before 39 weeks of gestation if:   A woman is carrying more than 1 baby.  Current standards are to deliver twin pregnancies at 60 weeks of gestation.  A woman is having complications, such as:  High blood pressure caused by pregnancy (preeclampsia).  Bleeding.  Infection.  There are conditions affecting the baby's health, such as:  Intrauterine growth restriction (IUGR), where the baby is not growing well.  Having abnormal fetal heart rate patterns on the monitor (nonreassuring tracing).  Having a lack of fluid that surrounds the baby (oligohydramnios).  Having placental issues.  Fluid that surrounds the baby (amniotic fluid) is leaking. There are many other safety reasons that a pregnancy may need to be induced early. REASONS AGAINST ELECTIVE BIRTH Sometimes early elective birth is not the best choice. It may not be a good idea if:   An early birth is just more convenient.  You want the baby to be born on a certain date, like a holiday.  You are more likely to need a cesarean delivery before 39 weeks of gestation. A cesarean delivery can lead to other problems. Problems include infection, bleeding, and not having enough iron in your blood (anemia), which can cause weakness.  Babies born early (52-37 weeks of gestation):  May need special care at the hospital or in a special care nursery.  Are at a greater risk for:  Brain damage.  Feeding problems.  Breathing problems.  Slow physical and mental development.  May need special care in a neonatal intensive care unit (NICU), but this is rare. The length of  the baby's stay in the hospital will depend on how quickly he or she progresses to a safe level of care.  Are at a greater risk for:  Infection.  Bleeding inside the brain.  Dying during their first year of life. REDUCING EARLY ELECTIVE BIRTHS Carrying a baby longer than 42 weeks of gestation is not good for the baby or the mother. A full-term pregnancy is best for baby and mother. Anything earlier can be risky for you and your baby.  Remember:  An early elective birth may lead to a cesarean delivery. This can lead to other problems for the mother and baby.  An early elective birth can result in developmental problems for your child.  A baby's brain continues to develop while in the uterus.  A baby's body continues to develop. The baby will be better able to breathe and eat when he or she is born near the due date.  A baby who stays in the uterus longer responds better. The baby will also bond better with you.   This information is not intended to replace advice given to you by your health care provider. Make sure you discuss any questions you have with your health care provider.   Document Released: 10/31/2010 Document Revised: 03/11/2014 Document Reviewed: 09/17/2012 Elsevier Interactive Patient Education [employer].

## 2015-02-02 NOTE — Progress Notes (Signed)
Patient went to [facility] yesterday for labor evaluation. Reports continued contractions off and on

## 2015-02-09 ENCOUNTER — Ambulatory Visit (INDEPENDENT_AMBULATORY_CARE_PROVIDER_SITE_OTHER): Payer: Medicaid Other | Admitting: Obstetrics & Gynecology

## 2015-02-09 ENCOUNTER — Ambulatory Visit (HOSPITAL_COMMUNITY)
Admission: RE | Admit: 2015-02-09 | Discharge: 2015-02-09 | Disposition: A | Discharge status: Home / Self Care | Payer: Medicaid Other | Source: Ambulatory Visit | Attending: Obstetrics & Gynecology | Admitting: Obstetrics & Gynecology

## 2015-02-09 ENCOUNTER — Other Ambulatory Visit (HOSPITAL_COMMUNITY)
Admission: RE | Admit: 2015-02-09 | Discharge: 2015-02-09 | Disposition: A | Discharge status: Home / Self Care | Payer: Medicaid Other | Source: Ambulatory Visit | Attending: Obstetrics & Gynecology | Admitting: Obstetrics & Gynecology

## 2015-02-09 VITALS — BP 120/86 | HR 101 | Wt 258.9 lb

## 2015-02-09 DIAGNOSIS — I10 Essential (primary) hypertension: Secondary | ICD-10-CM

## 2015-02-09 DIAGNOSIS — O10913 Unspecified pre-existing hypertension complicating pregnancy, third trimester: Secondary | ICD-10-CM

## 2015-02-09 DIAGNOSIS — Z3A36 36 weeks gestation of pregnancy: Secondary | ICD-10-CM | POA: Diagnosis not present

## 2015-02-09 DIAGNOSIS — O99213 Obesity complicating pregnancy, third trimester: Secondary | ICD-10-CM | POA: Insufficient documentation

## 2015-02-09 DIAGNOSIS — J4489 Other specified chronic obstructive pulmonary disease: Secondary | ICD-10-CM

## 2015-02-09 DIAGNOSIS — Z3483 Encounter for supervision of other normal pregnancy, third trimester: Secondary | ICD-10-CM

## 2015-02-09 DIAGNOSIS — J449 Chronic obstructive pulmonary disease, unspecified: Secondary | ICD-10-CM | POA: Insufficient documentation

## 2015-02-09 DIAGNOSIS — Z113 Encounter for screening for infections with a predominantly sexual mode of transmission: Secondary | ICD-10-CM | POA: Diagnosis not present

## 2015-02-09 DIAGNOSIS — O10013 Pre-existing essential hypertension complicating pregnancy, third trimester: Secondary | ICD-10-CM | POA: Insufficient documentation

## 2015-02-09 DIAGNOSIS — J45909 Unspecified asthma, uncomplicated: Secondary | ICD-10-CM | POA: Diagnosis not present

## 2015-02-09 LAB — POCT URINALYSIS DIP (DEVICE)
GLUCOSE, UA: NEGATIVE mg/dL
Hgb urine dipstick: NEGATIVE
KETONES UR: NEGATIVE mg/dL
LEUKOCYTES UA: NEGATIVE
Nitrite: NEGATIVE
Protein, ur: NEGATIVE mg/dL
SPECIFIC GRAVITY, URINE: 1.025 (ref 1.005–1.030)
UROBILINOGEN UA: 1 mg/dL (ref 0.0–1.0)
pH: 7 (ref 5.0–8.0)

## 2015-02-09 LAB — OB RESULTS CONSOLE GBS: STREP GROUP B AG: POSITIVE

## 2015-02-09 MED ORDER — BECLOMETHASONE DIPROPIONATE 40 MCG/ACT IN AERS: 2.0000 | INHALATION_SPRAY | Freq: Two times a day (BID) | RESPIRATORY_TRACT | Status: DC

## 2015-02-09 NOTE — Progress Notes (Signed)
Pt reports URI sx x5 days (cough, congestion).  She has also had diarrhea x3 days.

## 2015-02-09 NOTE — Progress Notes (Signed)
NST reactive Subjective: left flank pain worse with cough  Mackenzie Gibson is a 29 y.o. [phone] at [redacted]w[redacted]d being seen today for ongoing prenatal care.  She is currently monitored for the following issues for this high-risk pregnancy and has POLYCYSTIC OVARIAN DISEASE; PANIC DISORDER; SOMATIZATION DISORDER; TOBACCO USER; Eczema, dyshidrotic; Bipolar disorder ([place]); Chronic pelvic pain in female; Chronic hypertension complicating or reason for care during pregnancy; Exposure to STD; Hx of preeclampsia, prior pregnancy, currently pregnant; Maternal morbid obesity, antepartum ([place]); and Supervision of normal pregnancy, antepartum on her problem list.  Patient reports cough, c/w bronchitis.  Contractions: Irregular. Vag. Bleeding: Scant.  Movement: Present. Denies leaking of fluid.   The following portions of the patient's history were reviewed and updated as appropriate: allergies, current medications, past family history, past medical history, past social history, past surgical history and problem list. Problem list updated.  Objective:   Filed Vitals:   02/09/15 0900  BP: 120/86  Pulse: 101  Weight: 258 lb 14.4 oz (117.436 kg)    Fetal Status: Fetal Heart Rate (bpm): NST   Movement: Present     General:  Alert, oriented and cooperative. Patient is in no acute distress.  Skin: Skin is warm and dry. No rash noted.   Cardiovascular: Normal heart rate noted  Respiratory: Normal respiratory effort, cough bilateral wheezes, ronchi  Abdomen: Soft, gravid, appropriate for gestational age. Pain/Pressure: Present     Pelvic: Vag. Bleeding: Scant     Cervical exam performed        Extremities: Normal range of motion.  Edema: Trace  Mental Status: Normal mood and affect. Normal behavior. Normal judgment and thought content.   Urinalysis: Urine Protein: Negative Urine Glucose: Negative  Assessment and Plan:  Pregnancy: [id number] at [redacted]w[redacted]d  1. Chronic hypertension complicating or reason for care  during pregnancy, third trimester No meds, reactive NST, for BPP today - [name] MFM FETAL BPP WO NON STRESS; Future - GC/Chlamydia probe amp ([place])not at [facility] - Culture, beta strep (group b only) - RPR - Hepatitis C antibody-her  request  Preterm labor symptoms and general obstetric precautions including but not limited to vaginal bleeding, contractions, leaking of fluid and fetal movement were reviewed in detail with the patient. Please refer to After Visit Summary for other counseling recommendations.  Return in about 7 days (around 02/16/2015) for as scheduled. Asthmatic bronchitis-Qvar 2 puff BID  Woodroe Mode, MD

## 2015-02-10 LAB — RPR

## 2015-02-10 LAB — HEPATITIS C ANTIBODY: HCV Ab: NEGATIVE

## 2015-02-10 LAB — GC/CHLAMYDIA PROBE AMP (~~LOC~~) NOT AT ARMC
Chlamydia: NEGATIVE
Neisseria Gonorrhea: NEGATIVE

## 2015-02-11 LAB — CULTURE, BETA STREP (GROUP B ONLY)

## 2015-02-11 NOTE — [facility] Note (Signed)
[facility] Requested Prenatal records. Release consent sent and record faxed.

## 2015-02-16 ENCOUNTER — Other Ambulatory Visit: Payer: Self-pay | Admitting: Obstetrics & Gynecology

## 2015-02-16 ENCOUNTER — Ambulatory Visit (HOSPITAL_COMMUNITY)
Admission: RE | Admit: 2015-02-16 | Discharge: 2015-02-16 | Disposition: A | Discharge status: Home / Self Care | Payer: Medicaid Other | Source: Ambulatory Visit | Attending: Obstetrics & Gynecology | Admitting: Obstetrics & Gynecology

## 2015-02-16 ENCOUNTER — Ambulatory Visit (INDEPENDENT_AMBULATORY_CARE_PROVIDER_SITE_OTHER): Payer: Medicaid Other | Admitting: Obstetrics & Gynecology

## 2015-02-16 VITALS — BP 132/68 | HR 88 | Wt 258.5 lb

## 2015-02-16 DIAGNOSIS — Z3A37 37 weeks gestation of pregnancy: Secondary | ICD-10-CM | POA: Diagnosis not present

## 2015-02-16 DIAGNOSIS — I10 Essential (primary) hypertension: Secondary | ICD-10-CM | POA: Diagnosis not present

## 2015-02-16 DIAGNOSIS — O99213 Obesity complicating pregnancy, third trimester: Secondary | ICD-10-CM | POA: Diagnosis not present

## 2015-02-16 DIAGNOSIS — N760 Acute vaginitis: Secondary | ICD-10-CM

## 2015-02-16 DIAGNOSIS — O10913 Unspecified pre-existing hypertension complicating pregnancy, third trimester: Secondary | ICD-10-CM

## 2015-02-16 DIAGNOSIS — O10013 Pre-existing essential hypertension complicating pregnancy, third trimester: Secondary | ICD-10-CM | POA: Diagnosis present

## 2015-02-16 DIAGNOSIS — O23593 Infection of other part of genital tract in pregnancy, third trimester: Secondary | ICD-10-CM

## 2015-02-16 LAB — POCT URINALYSIS DIP (DEVICE)
Glucose, UA: NEGATIVE mg/dL
Hgb urine dipstick: NEGATIVE
Ketones, ur: NEGATIVE mg/dL
NITRITE: NEGATIVE
PH: 6 (ref 5.0–8.0)
PROTEIN: NEGATIVE mg/dL
Specific Gravity, Urine: 1.02 (ref 1.005–1.030)
UROBILINOGEN UA: 1 mg/dL (ref 0.0–1.0)

## 2015-02-16 MED ORDER — CLOBETASOL PROPIONATE 0.05 % EX CREA: 1.0000 "application " | TOPICAL_CREAM | Freq: Two times a day (BID) | CUTANEOUS | Status: DC

## 2015-02-16 NOTE — Progress Notes (Signed)
BPP done today.  Pt reports vaginal itching - denies d/c.  Pt did not want to take QVAR inhaler because the pharmacist told her it is a class C drug.

## 2015-02-16 NOTE — Patient Instructions (Signed)
Return to clinic for any obstetric concerns or go to MAU for evaluation  

## 2015-02-16 NOTE — Progress Notes (Signed)
Subjective:  Mackenzie Gibson is a 29 y.o. [phone] at [redacted]w[redacted]d being seen today for ongoing prenatal care.  She is currently monitored for the following issues for this high-risk pregnancy and has POLYCYSTIC OVARIAN DISEASE; PANIC DISORDER; SOMATIZATION DISORDER; TOBACCO USER; Eczema, dyshidrotic; Bipolar disorder ([place]); Chronic pelvic pain in female; Chronic hypertension complicating or reason for care during pregnancy; Exposure to STD; Hx of preeclampsia, prior pregnancy, currently pregnant; Maternal morbid obesity, antepartum ([place]); Supervision of high-risk pregnancy; and Asthmatic bronchitis , chronic ([place]) on her problem list.  Patient reports vulvar irritation x 2 weeks, at the top of her vulva.  Worsened by Vagisil and shaving.  Contractions: Irregular. Vag. Bleeding: Scant.  Movement: Present. Denies leaking of fluid.   The following portions of the patient's history were reviewed and updated as appropriate: allergies, current medications, past family history, past medical history, past social history, past surgical history and problem list. Problem list updated.  Objective:   Filed Vitals:   02/16/15 1057  BP: 132/68  Pulse: 88  Weight: 258 lb 8 oz (117.255 kg)    Fetal Status: Fetal Heart Rate (bpm): NST Fundal Height: 39 cm Movement: Present  Presentation: Vertex  General:  Alert, oriented and cooperative. Patient is in no acute distress.  Skin: Skin is warm and dry. No rash noted.   Cardiovascular: Normal heart rate noted  Respiratory: Normal respiratory effort, no problems with respiration noted  Abdomen: Soft, gravid, appropriate for gestational age. Pain/Pressure: Present     Pelvic: Vag. Bleeding: Scant    +Erythema at confluence of labia majora. White discharge seen, wet prep done.  Cervical exam performed Dilation: 1 Effacement (%): Thick Station: Ballotable  Extremities: Normal range of motion.  Edema: Trace  Mental Status: Normal mood and affect. Normal behavior. Normal  judgment and thought content.   Urinalysis: Urine Protein: Negative Urine Glucose: Negative NST performed today was reviewed and was found to be reactive.  BPP 10/10.  Assessment and Plan:  Pregnancy: [id number] at [redacted]w[redacted]d  1. Chronic hypertension complicating or reason for care during pregnancy, third trimester Continue recommended antenatal testing and prenatal care.  BPP 10/10 today. - [name] MFM FETAL BPP WO NON STRESS; Future  2. Vaginitis affecting pregnancy in third trimester, antepartum Likely chronic inflammation.  No lesions seen. Advised to stop close shaving of the area. - clobetasol cream (TEMOVATE) 0.05 %; Apply 1 application topically 2 (two) times daily. To affected area  Dispense: 60 g; Refill: 2 - Wet prep, genital  Term labor symptoms and general obstetric precautions including but not limited to vaginal bleeding, contractions, leaking of fluid and fetal movement were reviewed in detail with the patient. Please refer to After Visit Summary for other counseling recommendations.  Return in about 8 days (around 02/24/2015) for as scheduled.    Osborne Oman, MD

## 2015-02-17 LAB — WET PREP, GENITAL
TRICH WET PREP: NONE SEEN
Yeast Wet Prep HPF POC: NONE SEEN

## 2015-02-24 ENCOUNTER — Ambulatory Visit (INDEPENDENT_AMBULATORY_CARE_PROVIDER_SITE_OTHER): Payer: Medicaid Other | Admitting: Obstetrics & Gynecology

## 2015-02-24 ENCOUNTER — Other Ambulatory Visit (HOSPITAL_COMMUNITY): Payer: Self-pay | Admitting: Maternal and Fetal Medicine

## 2015-02-24 ENCOUNTER — Ambulatory Visit (HOSPITAL_COMMUNITY)
Admission: RE | Admit: 2015-02-24 | Discharge: 2015-02-24 | Disposition: A | Discharge status: Home / Self Care | Payer: Medicaid Other | Source: Ambulatory Visit | Attending: Family | Admitting: Family

## 2015-02-24 ENCOUNTER — Encounter (HOSPITAL_COMMUNITY): Payer: Self-pay

## 2015-02-24 VITALS — BP 130/74 | HR 89 | Wt 259.5 lb

## 2015-02-24 DIAGNOSIS — O10013 Pre-existing essential hypertension complicating pregnancy, third trimester: Secondary | ICD-10-CM | POA: Insufficient documentation

## 2015-02-24 DIAGNOSIS — I10 Essential (primary) hypertension: Secondary | ICD-10-CM

## 2015-02-24 DIAGNOSIS — O10913 Unspecified pre-existing hypertension complicating pregnancy, third trimester: Secondary | ICD-10-CM

## 2015-02-24 DIAGNOSIS — Z3A38 38 weeks gestation of pregnancy: Secondary | ICD-10-CM

## 2015-02-24 DIAGNOSIS — O99213 Obesity complicating pregnancy, third trimester: Secondary | ICD-10-CM

## 2015-02-24 LAB — POCT URINALYSIS DIP (DEVICE)
BILIRUBIN URINE: NEGATIVE
GLUCOSE, UA: NEGATIVE mg/dL
Hgb urine dipstick: NEGATIVE
KETONES UR: NEGATIVE mg/dL
LEUKOCYTES UA: NEGATIVE
Nitrite: NEGATIVE
PROTEIN: NEGATIVE mg/dL
SPECIFIC GRAVITY, URINE: 1.02 (ref 1.005–1.030)
Urobilinogen, UA: 1 mg/dL (ref 0.0–1.0)
pH: 7 (ref 5.0–8.0)

## 2015-02-24 NOTE — Patient Instructions (Signed)
Vaginal Delivery °During delivery, your health care provider will help you give birth to your baby. During a vaginal delivery, you will work to push the baby out of your vagina. However, before you can push your baby out, a few things need to happen. The opening of your uterus (cervix) has to soften, thin out, and open up (dilate) all the way to 10 cm. Also, your baby has to move down from the uterus into your vagina.  °SIGNS OF LABOR  °Your health care provider will first need to make sure you are in labor. Signs of labor include:  °· Passing what is called the mucous plug before labor begins. This is a small amount of blood-stained mucus. °· Having regular, painful uterine contractions.   °· The time between contractions gets shorter.   °· The discomfort and pain gradually get more intense. °· Contraction pains get worse when walking and do not go away when resting.   °· Your cervix becomes thinner (effacement) and dilates. °BEFORE THE DELIVERY °Once you are in labor and admitted into the hospital or care center, your health care provider may do the following:  °· Perform a complete physical exam. °· Review any complications related to pregnancy or labor.  °· Check your blood pressure, pulse, temperature, and heart rate (vital signs).   °· Determine if, and when, the rupture of amniotic membranes occurred. °· Do a vaginal exam (using a sterile glove and lubricant) to determine:   °¨ The position (presentation) of the baby. Is the baby's head presenting first (vertex) in the birth canal (vagina), or are the feet or buttocks first (breech)?   °¨ The level (station) of the baby's head within the birth canal.   °¨ The effacement and dilatation of the cervix.   °· An electronic fetal monitor is usually placed on your abdomen when you first arrive. This is used to monitor your contractions and the baby's heart rate. °¨ When the monitor is on your abdomen (external fetal monitor), it can only pick up the frequency and  length of your contractions. It cannot tell the strength of your contractions. °¨ If it becomes necessary for your health care provider to know exactly how strong your contractions are or to see exactly what the baby's heart rate is doing, an internal monitor may be inserted into your vagina and uterus. Your health care provider will discuss the benefits and risks of using an internal monitor and obtain your permission before inserting the device. °¨ Continuous fetal monitoring may be needed if you have an epidural, are receiving certain medicines (such as oxytocin), or have pregnancy or labor complications. °· An IV access tube may be placed into a vein in your arm to deliver fluids and medicines if necessary. °THREE STAGES OF LABOR AND DELIVERY °Normal labor and delivery is divided into three stages. °First Stage °This stage starts when you begin to contract regularly and your cervix begins to efface and dilate. It ends when your cervix is completely open (fully dilated). The first stage is the longest stage of labor and can last from 3 hours to 15 hours.  °Several methods are available to help with labor pain. You and your health care provider will decide which option is best for you. Options include:  °· Opioid medicines. These are strong pain medicines that you can get through your IV tube or as a shot into your muscle. These medicines lessen pain but do not make it go away completely.  °· Epidural. A medicine is given through a thin tube that   is inserted in your back. The medicine numbs the lower part of your body and prevents any pain in that area. °· Paracervical pain medicine. This is an injection of an anesthetic on each side of your cervix.   °· You may request natural childbirth, which does not involve the use of pain medicines or an epidural during labor and delivery. Instead, you will use other things, such as breathing exercises, to help cope with the pain. °Second Stage °The second stage of labor  begins when your cervix is fully dilated at 10 cm. It continues until you push your baby down through the birth canal and the baby is born. This stage can take only minutes or several hours. °· The location of your baby's head as it moves through the birth canal is reported as a number called a station. If the baby's head has not started its descent, the station is described as being at minus 3 (-3). When your baby's head is at the zero station, it is at the middle of the birth canal and is engaged in the pelvis. The station of your baby helps indicate the progress of the second stage of labor. °· When your baby is born, your health care provider may hold the baby with his or her head lowered to prevent amniotic fluid, mucus, and blood from getting into the baby's lungs. The baby's mouth and nose may be suctioned with a small bulb syringe to remove any additional fluid. °· Your health care provider may then place the baby on your stomach. It is important to keep the baby from getting cold. To do this, the health care provider will dry the baby off, place the baby directly on your skin (with no blankets between you and the baby), and cover the baby with warm, dry blankets.   °· The umbilical cord is cut. °Third Stage °During the third stage of labor, your health care provider will deliver the placenta (afterbirth) and make sure your bleeding is under control. The delivery of the placenta usually takes about 5 minutes but can take up to 30 minutes. After the placenta is delivered, a medicine may be given either by IV or injection to help contract the uterus and control bleeding. If you are planning to breastfeed, you can try to do so now. °After you deliver the placenta, your uterus should contract and get very firm. If your uterus does not remain firm, your health care provider will massage it. This is important because the contraction of the uterus helps cut off bleeding at the site where the placenta was attached  to your uterus. If your uterus does not contract properly and stay firm, you may continue to bleed heavily. If there is a lot of bleeding, medicines may be given to contract the uterus and stop the bleeding.  °  °This information is not intended to replace advice given to you by your health care provider. Make sure you discuss any questions you have with your health care provider. °  °Document Released: 11/28/2007 Document Revised: 03/11/2014 Document Reviewed: 10/16/2011 °Elsevier Interactive Patient Education ©2016 Elsevier Inc[employer] ° °

## 2015-02-24 NOTE — Progress Notes (Signed)
Subjective: would like induction for social reasons, transportation problem and distance to [place] is a 29 y.o. [phone] at [redacted]w[redacted]d being seen today for ongoing prenatal care.  She is currently monitored for the following issues for this high-risk pregnancy and has POLYCYSTIC OVARIAN DISEASE; PANIC DISORDER; SOMATIZATION DISORDER; TOBACCO USER; Eczema, dyshidrotic; Bipolar disorder ([place]); Chronic pelvic pain in female; Chronic hypertension complicating or reason for care during pregnancy; Exposure to STD; Hx of preeclampsia, prior pregnancy, currently pregnant; Maternal morbid obesity, antepartum ([place]); Supervision of high-risk pregnancy; and Asthmatic bronchitis , chronic ([place]) on her problem list.  Patient reports occasional contractions.  Contractions: Irregular. Vag. Bleeding: Small.  Movement: Present. Denies leaking of fluid.   The following portions of the patient's history were reviewed and updated as appropriate: allergies, current medications, past family history, past medical history, past social history, past surgical history and problem list. Problem list updated.  Objective:   Filed Vitals:   02/24/15 0937  BP: 130/74  Pulse: 89  Weight: 259 lb 8 oz (117.708 kg)    Fetal Status: Fetal Heart Rate (bpm): NST   Movement: Present     General:  Alert, oriented and cooperative. Patient is in no acute distress.  Skin: Skin is warm and dry. No rash noted.   Cardiovascular: Normal heart rate noted  Respiratory: Normal respiratory effort, no problems with respiration noted  Abdomen: Soft, gravid, appropriate for gestational age. Pain/Pressure: Present     Pelvic: Vag. Bleeding: Small     Cervical exam performed Dilation: 1.5 Effacement (%): Thick Station: Ballotable  Extremities: Normal range of motion.     Mental Status: Normal mood and affect. Normal behavior. Normal judgment and thought content.   Urinalysis: Urine Protein: Negative Urine Glucose:  Negative  Assessment and Plan:  Pregnancy: [id number] at [redacted]w[redacted]d  1. Chronic hypertension complicating or reason for care during pregnancy, third trimester nst reactive - Fetal nonstress test Explained no induction for social indications Term labor symptoms and general obstetric precautions including but not limited to vaginal bleeding, contractions, leaking of fluid and fetal movement were reviewed in detail with the patient. Please refer to After Visit Summary for other counseling recommendations.  Return in about 4 days (around 02/28/2015).   Woodroe Mode, MD

## 2015-02-24 NOTE — Progress Notes (Signed)
Pt states she went to [facility] several days ago for possible labor. She was told her Cx was 1.5cm dilated.  Pt desires Cx exam today.

## 2015-02-28 ENCOUNTER — Ambulatory Visit (INDEPENDENT_AMBULATORY_CARE_PROVIDER_SITE_OTHER): Payer: Medicaid Other | Admitting: *Deleted

## 2015-02-28 VITALS — BP 131/81 | HR 109

## 2015-02-28 DIAGNOSIS — O10913 Unspecified pre-existing hypertension complicating pregnancy, third trimester: Secondary | ICD-10-CM

## 2015-02-28 DIAGNOSIS — I10 Essential (primary) hypertension: Secondary | ICD-10-CM | POA: Diagnosis not present

## 2015-02-28 NOTE — Progress Notes (Signed)
NST reviewed and reactive.  

## 2015-02-28 NOTE — Progress Notes (Signed)
Pt states, "I'm supposed to have my cervix checked and get an induction appointment. That's what Dr. Roselie Awkward told me last Thursday. I have been having contractions and I have lost my mucous plug which had blood in it."  Cx exam and membranes swept by Yvonne Kendall, CNM.  Cx = 2/50/vtx.   Pt was explained that there is no current fetal or maternal indication for induction of labor prior to standard recommendation of 40 wks (Chronic HTN not on meds).  Pt advised to keep appt on 12/29 as scheduled and agreed.  IOL scheduled 03/07/15 @ 0700.

## 2015-03-02 ENCOUNTER — Inpatient Hospital Stay (HOSPITAL_COMMUNITY): Payer: Medicaid Other | Admitting: Anesthesiology

## 2015-03-02 ENCOUNTER — Encounter: Payer: Medicaid Other | Admitting: Family Medicine

## 2015-03-02 ENCOUNTER — Ambulatory Visit (HOSPITAL_COMMUNITY)
Admission: RE | Admit: 2015-03-02 | Discharge: 2015-03-02 | Disposition: A | Discharge status: Home / Self Care | Payer: Medicaid Other | Source: Ambulatory Visit | Attending: Obstetrics & Gynecology | Admitting: Obstetrics & Gynecology

## 2015-03-02 ENCOUNTER — Inpatient Hospital Stay (HOSPITAL_COMMUNITY)
Admission: AD | Admit: 2015-03-02 | Discharge: 2015-03-04 | DRG: 767 | Disposition: A | Discharge status: Home / Self Care | Payer: Medicaid Other | Source: Ambulatory Visit | Attending: Obstetrics & Gynecology | Admitting: Obstetrics & Gynecology

## 2015-03-02 ENCOUNTER — Encounter (HOSPITAL_COMMUNITY): Payer: Self-pay | Admitting: Obstetrics

## 2015-03-02 DIAGNOSIS — O9952 Diseases of the respiratory system complicating childbirth: Secondary | ICD-10-CM | POA: Diagnosis present

## 2015-03-02 DIAGNOSIS — F319 Bipolar disorder, unspecified: Secondary | ICD-10-CM | POA: Diagnosis present

## 2015-03-02 DIAGNOSIS — J449 Chronic obstructive pulmonary disease, unspecified: Secondary | ICD-10-CM | POA: Diagnosis present

## 2015-03-02 DIAGNOSIS — F17210 Nicotine dependence, cigarettes, uncomplicated: Secondary | ICD-10-CM | POA: Diagnosis present

## 2015-03-02 DIAGNOSIS — Z6841 Body Mass Index (BMI) 40.0 and over, adult: Secondary | ICD-10-CM | POA: Diagnosis not present

## 2015-03-02 DIAGNOSIS — O10919 Unspecified pre-existing hypertension complicating pregnancy, unspecified trimester: Secondary | ICD-10-CM

## 2015-03-02 DIAGNOSIS — O99214 Obesity complicating childbirth: Secondary | ICD-10-CM | POA: Diagnosis present

## 2015-03-02 DIAGNOSIS — O1092 Unspecified pre-existing hypertension complicating childbirth: Principal | ICD-10-CM | POA: Diagnosis present

## 2015-03-02 DIAGNOSIS — O99344 Other mental disorders complicating childbirth: Secondary | ICD-10-CM | POA: Diagnosis present

## 2015-03-02 DIAGNOSIS — Z302 Encounter for sterilization: Secondary | ICD-10-CM

## 2015-03-02 DIAGNOSIS — O99824 Streptococcus B carrier state complicating childbirth: Secondary | ICD-10-CM | POA: Diagnosis present

## 2015-03-02 DIAGNOSIS — O99334 Smoking (tobacco) complicating childbirth: Secondary | ICD-10-CM | POA: Diagnosis present

## 2015-03-02 DIAGNOSIS — Z3A39 39 weeks gestation of pregnancy: Secondary | ICD-10-CM

## 2015-03-02 DIAGNOSIS — F17200 Nicotine dependence, unspecified, uncomplicated: Secondary | ICD-10-CM | POA: Diagnosis present

## 2015-03-02 DIAGNOSIS — Z9851 Tubal ligation status: Secondary | ICD-10-CM

## 2015-03-02 DIAGNOSIS — O10913 Unspecified pre-existing hypertension complicating pregnancy, third trimester: Secondary | ICD-10-CM

## 2015-03-02 DIAGNOSIS — F3160 Bipolar disorder, current episode mixed, unspecified: Secondary | ICD-10-CM | POA: Diagnosis present

## 2015-03-02 DIAGNOSIS — Z720 Tobacco use: Secondary | ICD-10-CM | POA: Diagnosis present

## 2015-03-02 DIAGNOSIS — O0993 Supervision of high risk pregnancy, unspecified, third trimester: Secondary | ICD-10-CM

## 2015-03-02 HISTORY — DX: Unspecified pre-existing hypertension complicating pregnancy, unspecified trimester: O10.919

## 2015-03-02 LAB — TYPE AND SCREEN
ABO/RH(D): A POS
ANTIBODY SCREEN: NEGATIVE

## 2015-03-02 LAB — CBC
HEMATOCRIT: 38.6 % (ref 36.0–46.0)
HEMOGLOBIN: 13.8 g/dL (ref 12.0–15.0)
MCH: 31.7 pg (ref 26.0–34.0)
MCHC: 35.8 g/dL (ref 30.0–36.0)
MCV: 88.5 fL (ref 78.0–100.0)
Platelets: 162 10*3/uL (ref 150–400)
RBC: 4.36 MIL/uL (ref 3.87–5.11)
RDW: 14.2 % (ref 11.5–15.5)
WBC: 11 10*3/uL — AB (ref 4.0–10.5)

## 2015-03-02 LAB — COMPREHENSIVE METABOLIC PANEL
ALBUMIN: 2.6 g/dL — AB (ref 3.5–5.0)
ALK PHOS: 184 U/L — AB (ref 38–126)
ALT: 15 U/L (ref 14–54)
AST: 18 U/L (ref 15–41)
Anion gap: 9 (ref 5–15)
BILIRUBIN TOTAL: 0.8 mg/dL (ref 0.3–1.2)
BUN: 6 mg/dL (ref 6–20)
CO2: 21 mmol/L — ABNORMAL LOW (ref 22–32)
Calcium: 9 mg/dL (ref 8.9–10.3)
Chloride: 106 mmol/L (ref 101–111)
Creatinine, Ser: 0.61 mg/dL (ref 0.44–1.00)
GFR calc Af Amer: >60 mL/min (ref >60)
GFR calc non Af Amer: >60 mL/min (ref >60)
GLUCOSE: 120 mg/dL — AB (ref 65–99)
POTASSIUM: 3.5 mmol/L (ref 3.5–5.1)
Sodium: 136 mmol/L (ref 135–145)
TOTAL PROTEIN: 6.1 g/dL — AB (ref 6.5–8.1)

## 2015-03-02 LAB — CBC WITH DIFFERENTIAL/PLATELET
Basophils Absolute: 0 10*3/uL (ref 0.0–0.1)
Basophils Relative: 0 %
EOS ABS: 0.2 10*3/uL (ref 0.0–0.7)
EOS PCT: 2 %
HCT: 36.6 % (ref 36.0–46.0)
HEMOGLOBIN: 13.1 g/dL (ref 12.0–15.0)
LYMPHS PCT: 25 %
Lymphs Abs: 2.5 10*3/uL (ref 0.7–4.0)
MCH: 31.6 pg (ref 26.0–34.0)
MCHC: 35.8 g/dL (ref 30.0–36.0)
MCV: 88.2 fL (ref 78.0–100.0)
Monocytes Absolute: 0.5 10*3/uL (ref 0.1–1.0)
Monocytes Relative: 5 %
NEUTROS ABS: 6.7 10*3/uL (ref 1.7–7.7)
Neutrophils Relative %: 68 %
Platelets: 166 10*3/uL (ref 150–400)
RBC: 4.15 MIL/uL (ref 3.87–5.11)
RDW: 14.2 % (ref 11.5–15.5)
WBC: 9.9 10*3/uL (ref 4.0–10.5)

## 2015-03-02 LAB — PROTEIN / CREATININE RATIO, URINE
Creatinine, Urine: 140 mg/dL
Protein Creatinine Ratio: 0.14 mg/mg{Cre} (ref 0.00–0.15)
Total Protein, Urine: 19 mg/dL

## 2015-03-02 MED ORDER — FENTANYL CITRATE (PF) 100 MCG/2ML IJ SOLN: 100.0000 ug | INTRAMUSCULAR | Status: DC | PRN

## 2015-03-02 MED ORDER — FENTANYL 2.5 MCG/ML BUPIVACAINE 1/10 % EPIDURAL INFUSION (WH - ANES)
14.0000 mL/h | INTRAMUSCULAR | Status: DC | PRN
  Administered 2015-03-02 – 2015-03-03 (×3): 14 mL/h via EPIDURAL
  Filled 2015-03-02: qty 125

## 2015-03-02 MED ORDER — PHENYLEPHRINE 40 MCG/ML (10ML) SYRINGE FOR IV PUSH (FOR BLOOD PRESSURE SUPPORT)
80.0000 ug | PREFILLED_SYRINGE | INTRAVENOUS | Status: DC | PRN
  Filled 2015-03-02: qty 2

## 2015-03-02 MED ORDER — OXYTOCIN 40 UNITS IN LACTATED RINGERS INFUSION - SIMPLE MED
1.0000 m[IU]/min | INTRAVENOUS | Status: DC
  Administered 2015-03-02: 2 m[IU]/min via INTRAVENOUS

## 2015-03-02 MED ORDER — PHENYLEPHRINE 40 MCG/ML (10ML) SYRINGE FOR IV PUSH (FOR BLOOD PRESSURE SUPPORT)
PREFILLED_SYRINGE | INTRAVENOUS | Status: AC
  Filled 2015-03-02: qty 20

## 2015-03-02 MED ORDER — OXYTOCIN 40 UNITS IN LACTATED RINGERS INFUSION - SIMPLE MED
62.5000 mL/h | INTRAVENOUS | Status: DC
  Filled 2015-03-02: qty 1000

## 2015-03-02 MED ORDER — LIDOCAINE HCL (PF) 1 % IJ SOLN
INTRAMUSCULAR | Status: DC | PRN
  Administered 2015-03-02: 3 mL via EPIDURAL
  Administered 2015-03-02: 2 mL via EPIDURAL
  Administered 2015-03-02: 5 mL via EPIDURAL

## 2015-03-02 MED ORDER — CITRIC ACID-SODIUM CITRATE 334-500 MG/5ML PO SOLN: 30.0000 mL | ORAL | Status: DC | PRN

## 2015-03-02 MED ORDER — LACTATED RINGERS IV SOLN
INTRAVENOUS | Status: DC
  Administered 2015-03-02: 14:00:00 via INTRAVENOUS

## 2015-03-02 MED ORDER — PENICILLIN G POTASSIUM 5000000 UNITS IJ SOLR
5.0000 10*6.[IU] | Freq: Once | INTRAVENOUS | Status: AC
  Administered 2015-03-02: 5 10*6.[IU] via INTRAVENOUS
  Filled 2015-03-02: qty 5

## 2015-03-02 MED ORDER — FENTANYL 2.5 MCG/ML BUPIVACAINE 1/10 % EPIDURAL INFUSION (WH - ANES)
INTRAMUSCULAR | Status: AC
  Administered 2015-03-02: 14 mL/h via EPIDURAL
  Filled 2015-03-02: qty 125

## 2015-03-02 MED ORDER — OXYTOCIN BOLUS FROM INFUSION: 500.0000 mL | INTRAVENOUS | Status: DC

## 2015-03-02 MED ORDER — TERBUTALINE SULFATE 1 MG/ML IJ SOLN
0.2500 mg | Freq: Once | INTRAMUSCULAR | Status: DC | PRN
  Filled 2015-03-02: qty 1

## 2015-03-02 MED ORDER — EPHEDRINE 5 MG/ML INJ
10.0000 mg | INTRAVENOUS | Status: DC | PRN
  Filled 2015-03-02: qty 2

## 2015-03-02 MED ORDER — ACETAMINOPHEN 325 MG PO TABS
650.0000 mg | ORAL_TABLET | ORAL | Status: DC | PRN
  Administered 2015-03-02: 325 mg via ORAL
  Filled 2015-03-02: qty 2

## 2015-03-02 MED ORDER — DIPHENHYDRAMINE HCL 50 MG/ML IJ SOLN: 12.5000 mg | INTRAMUSCULAR | Status: DC | PRN

## 2015-03-02 MED ORDER — LIDOCAINE HCL (PF) 1 % IJ SOLN
30.0000 mL | INTRAMUSCULAR | Status: DC | PRN
  Filled 2015-03-02: qty 30

## 2015-03-02 MED ORDER — LACTATED RINGERS IV SOLN: 500.0000 mL | INTRAVENOUS | Status: DC | PRN

## 2015-03-02 MED ORDER — ONDANSETRON HCL 4 MG/2ML IJ SOLN
4.0000 mg | Freq: Four times a day (QID) | INTRAMUSCULAR | Status: DC | PRN
  Administered 2015-03-03: 4 mg via INTRAVENOUS
  Filled 2015-03-02: qty 2

## 2015-03-02 MED ORDER — PENICILLIN G POTASSIUM 5000000 UNITS IJ SOLR
2.5000 10*6.[IU] | INTRAVENOUS | Status: DC
  Administered 2015-03-02 – 2015-03-03 (×5): 2.5 10*6.[IU] via INTRAVENOUS
  Filled 2015-03-02 (×7): qty 2.5

## 2015-03-02 NOTE — Progress Notes (Signed)
   Mackenzie Gibson is a 29 y.o. [phone] at [redacted]w[redacted]d  admitted for induction of labor due to Hypertension.  Subjective:  Comfortable with epidural Objective: Filed Vitals:   03/02/15 2043 03/02/15 2048 03/02/15 2055 03/02/15 2103  BP: 131/76 108/77 117/58 117/77  Pulse: 122 126 105 100  Temp:      TempSrc:      Resp:      Height:      Weight:          FHT:  FHR: 120 bpm, variability: moderate,  accelerations:  Present,  decelerations:  Absent UC:   regular, every 2-3 minutes SVE:   Dilation: 3 Effacement (%): 50 Station: -3 Exam by:: McSween, RN Pitocin @ 12 mu/min  Labs: Lab Results  Component Value Date   WBC 9.9 03/02/2015   HGB 13.1 03/02/2015   HCT 36.6 03/02/2015   MCV 88.2 03/02/2015   PLT 166 03/02/2015    Assessment / Plan: Induction of labor due to [facility],  progressing well on pitocin  Labor: Progressing normally Fetal Wellbeing:  Category I Pain Control:  Epidural Anticipated MOD:  NSVD  CRESENZO-DISHMAN,Tareek Sabo 03/02/2015, 9:59 PM

## 2015-03-02 NOTE — H&P (Signed)
Mackenzie Gibson is a 29 y.o. female [phone] at [redacted]w[redacted]d weeks IUPsent from MFM for 6/8 BPP and induction of labor for chronic hypertension.  Pt verbalizes desire to get epidural early to avoid "missing it".  Did not get epidural in time with last birth.    Pt receives prenatal care at [facility].  Pregnancy dated by LMP consistent with 13 wk ultrasound.  Pregnancy complicated by chronic hypertension, not requiring meds.  Last growth ultrasound on 02/24/15 with EFW of >90%ile.    History OB History    Gravida Para Term Preterm AB TAB SAB Ectopic Multiple Living   5 3 3  0 1 0 1 0 0 3     Past Medical History  Diagnosis Date  . PCOS (polycystic ovarian syndrome)   . Depression   . Bipolar disorder ([place])   . Bipolar disorder ([place])   . Anxiety   . Pregnancy induced hypertension    Past Surgical History  Procedure Laterality Date  . Cholecystectomy    . Eye surgery     Family History: family history includes Depression in her father and mother. Social History:  reports that she has been smoking Cigarettes.  She has been smoking about 0.30 packs per day. She does not have any smokeless tobacco history on file. She reports that she does not drink alcohol or use illicit drugs.   Prenatal Transfer Tool  Maternal Diabetes: No Genetic Screening: Normal Maternal Ultrasounds/Referrals: Normal Fetal Ultrasounds or other Referrals:  None Maternal Substance Abuse:  No Significant Maternal Medications:  None Significant Maternal Lab Results:  Lab values include: Group B Strep positive Other Comments:  None  Review of Systems  Constitutional: Negative for fever and chills.  Eyes: Negative for blurred vision and double vision.  Gastrointestinal: Negative for abdominal pain (intermittent contractions).  Neurological: Positive for headaches.  All other systems reviewed and are negative.     Blood pressure 144/95, pulse 103, temperature 97.5 F (36.4 C), temperature source Oral, resp.  rate 18, height 5\' 7"  (1.702 m), weight 117.028 kg (258 lb), last menstrual period 05/24/2014, unknown if currently breastfeeding. Maternal Exam:  Abdomen: Estimated fetal weight is 7-7.5 lbs.   Fetal presentation: vertex  Introitus: Vagina is positive for vaginal discharge (mucusy).  Pelvis: adequate for delivery.      Fetal Exam Fetal Monitor Review: Baseline rate: 110-120's.  Variability: moderate (6-25 bpm).   Pattern: accelerations present.    Fetal State Assessment: Category I - tracings are normal.     Physical Exam  Constitutional: She is oriented to person, place, and time. She appears well-developed and well-nourished.  HENT:  Head: Normocephalic.  Neck: Normal range of motion. Neck supple.  Cardiovascular: Normal rate, regular rhythm and normal heart sounds.   Respiratory: Effort normal and breath sounds normal. No respiratory distress.  GI: Soft. There is no tenderness.  Genitourinary: No bleeding in the vagina. Vaginal discharge (mucusy) found.  Musculoskeletal: Normal range of motion. She exhibits no edema.  Neurological: She is alert and oriented to person, place, and time.  Skin: Skin is warm and dry.    Dilation: 2 Effacement (%): 70 Cervical Position: Posterior Station: -3, -2 Presentation: Vertex Exam by:: h stone rnc  Prenatal labs: ABO, Rh: --/--/A POS (04/28 1635) Antibody: NEG (05/26 1152) Rubella: 0.42 (05/26 1152) RPR: NON REAC (12/08 1023)  HBsAg: NEGATIVE (05/26 1152)  HIV: NONREACTIVE (09/27 1114)  GBS: Positive (12/08 0000)   Assessment/Plan: 29 y.o. [id number] at [redacted]w[redacted]d IUP Chronic Hypertension  Category I Fetal Tracing GBS Positive (12/08 0000)   Plan: Admit to [facility] Begin pitocin augmentation PCN for GBS prophylaxis    Gwen Pounds 03/02/2015, 3:36 PM

## 2015-03-02 NOTE — Anesthesia Preprocedure Evaluation (Signed)
Anesthesia Evaluation  Patient identified by MRN, date of birth, ID band Patient awake    Reviewed: Allergy & Precautions, NPO status , Patient's Chart, lab work & pertinent test results  History of Anesthesia Complications Negative for: history of anesthetic complications  Airway Mallampati: III  TM Distance: >3 FB Neck ROM: Full    Dental  (+) Teeth Intact, Dental Advisory Given   Pulmonary asthma , Current Smoker,    Pulmonary exam normal breath sounds clear to auscultation       Cardiovascular hypertension, Normal cardiovascular exam Rhythm:Regular Rate:Normal     Neuro/Psych PSYCHIATRIC DISORDERS Anxiety Depression Bipolar Disorder negative neurological ROS     GI/Hepatic negative GI ROS, Neg liver ROS,   Endo/Other  Morbid obesity  Renal/GU negative Renal ROS     Musculoskeletal negative musculoskeletal ROS (+)   Abdominal   Peds  Hematology negative hematology ROS (+) Plt 166k    Anesthesia Other Findings Day of surgery medications reviewed with the patient.  Reproductive/Obstetrics (+) Pregnancy Pre-E with prior pregnancy                             Anesthesia Physical Anesthesia Plan  ASA: III  Anesthesia Plan: Epidural   Post-op Pain Management:    Induction:   Airway Management Planned:   Additional Equipment:   Intra-op Plan:   Post-operative Plan:   Informed Consent: I have reviewed the patients History and Physical, chart, labs and discussed the procedure including the risks, benefits and alternatives for the proposed anesthesia with the patient or authorized representative who has indicated his/her understanding and acceptance.   Dental advisory given  Plan Discussed with:   Anesthesia Plan Comments: (Patient identified. Risks/Benefits/Options discussed with patient including but not limited to bleeding, infection, nerve damage, paralysis, failed block,  incomplete pain control, headache, blood pressure changes, nausea, vomiting, reactions to medication both or allergic, itching and postpartum back pain. Confirmed with bedside nurse the patient's most recent platelet count. Confirmed with patient that they are not currently taking any anticoagulation, have any bleeding history or any family history of bleeding disorders. Patient expressed understanding and wished to proceed. All questions were answered. )        Anesthesia Quick Evaluation

## 2015-03-02 NOTE — Progress Notes (Signed)
   Mackenzie Gibson is a 29 y.o. [phone] at [redacted]w[redacted]d  admitted for induction of labor due to Hypertension.  Subjective:  Ctx 6/10  Objective: Filed Vitals:   03/02/15 1633 03/02/15 1702 03/02/15 1733 03/02/15 1804  BP: 138/88 145/86 131/83 125/79  Pulse: 99 97 100 94  Temp:    97.9 F (36.6 C)  TempSrc:    Oral  Resp: 18 18  20   Height:      Weight:          FHT:  FHR: 140 bpm, variability: moderate,  accelerations:  Present,  decelerations:  Absent UC:   regular, every 2-3 minutes SVE:   Dilation: 3 Effacement (%): Thick Station: -2, -3 Exam by:: fran cresenzo CNM Pitocin @ 8 mu/min  Labs: Lab Results  Component Value Date   WBC 11.0* 03/02/2015   HGB 13.8 03/02/2015   HCT 38.6 03/02/2015   MCV 88.5 03/02/2015   PLT 162 03/02/2015    Assessment / Plan: Induction of labor due to chtn,  progressing well on pitocin  Labor: Progressing normally Fetal Wellbeing:  Category I Pain Control:  Labor support without medications Anticipated MOD:  NSVD  CRESENZO-DISHMAN,Jiovanny Burdell 03/02/2015, 6:27 PM

## 2015-03-02 NOTE — Anesthesia Procedure Notes (Signed)
Epidural Patient location during procedure: OB  Staffing Anesthesiologist: Catalina Gravel Performed by: anesthesiologist   Preanesthetic Checklist Completed: patient identified, pre-op evaluation, timeout performed, IV checked, risks and benefits discussed and monitors and equipment checked  Epidural Patient position: sitting Prep: DuraPrep Patient monitoring: blood pressure and continuous pulse ox Approach: midline Location: L3-L4 Injection technique: LOR air  Needle:  Needle type: Tuohy  Needle gauge: 17 G Needle length: 9 cm Needle insertion depth: 6 cm Catheter size: 19 Gauge Catheter at skin depth: 11 cm Test dose: negative and Other (1% Lidocaine)  Additional Notes Patient identified.  Risk benefits discussed including failed block, incomplete pain control, headache, nerve damage, paralysis, blood pressure changes, nausea, vomiting, reactions to medication both toxic or allergic, and postpartum back pain.  Patient expressed understanding and wished to proceed.  All questions were answered.  Sterile technique used throughout procedure and epidural site dressed with sterile barrier dressing. No paresthesia or other complications noted. The patient did not experience any signs of intravascular injection such as tinnitus or metallic taste in mouth nor signs of intrathecal spread such as rapid motor block. Please see nursing notes for vital signs. Reason for block:procedure for pain

## 2015-03-03 ENCOUNTER — Inpatient Hospital Stay (HOSPITAL_COMMUNITY): Payer: Medicaid Other | Admitting: Anesthesiology

## 2015-03-03 ENCOUNTER — Encounter (HOSPITAL_COMMUNITY): Payer: Self-pay | Admitting: Anesthesiology

## 2015-03-03 ENCOUNTER — Encounter (HOSPITAL_COMMUNITY): Payer: Self-pay | Admitting: Obstetrics & Gynecology

## 2015-03-03 ENCOUNTER — Encounter (HOSPITAL_COMMUNITY)
Admission: AD | Disposition: A | Discharge status: Home / Self Care | Payer: Self-pay | Source: Ambulatory Visit | Attending: Obstetrics & Gynecology

## 2015-03-03 DIAGNOSIS — Z302 Encounter for sterilization: Secondary | ICD-10-CM | POA: Diagnosis not present

## 2015-03-03 DIAGNOSIS — O99824 Streptococcus B carrier state complicating childbirth: Secondary | ICD-10-CM

## 2015-03-03 DIAGNOSIS — O1092 Unspecified pre-existing hypertension complicating childbirth: Secondary | ICD-10-CM

## 2015-03-03 DIAGNOSIS — Z9851 Tubal ligation status: Secondary | ICD-10-CM

## 2015-03-03 DIAGNOSIS — Z3A39 39 weeks gestation of pregnancy: Secondary | ICD-10-CM

## 2015-03-03 HISTORY — PX: TUBAL LIGATION: SHX77

## 2015-03-03 LAB — RPR: RPR: NONREACTIVE

## 2015-03-03 SURGERY — LIGATION, FALLOPIAN TUBE, POSTPARTUM
Anesthesia: Epidural | Laterality: Bilateral

## 2015-03-03 MED ORDER — ONDANSETRON HCL 4 MG/2ML IJ SOLN
INTRAMUSCULAR | Status: AC
Start: 2015-03-03 — End: 2015-03-03
  Filled 2015-03-03: qty 2

## 2015-03-03 MED ORDER — SIMETHICONE 80 MG PO CHEW: 80.0000 mg | CHEWABLE_TABLET | ORAL | Status: DC | PRN

## 2015-03-03 MED ORDER — BUPIVACAINE HCL (PF) 0.25 % IJ SOLN
INTRAMUSCULAR | Status: DC | PRN
  Administered 2015-03-03: 10 mL

## 2015-03-03 MED ORDER — IBUPROFEN 600 MG PO TABS
600.0000 mg | ORAL_TABLET | Freq: Four times a day (QID) | ORAL | Status: DC
  Administered 2015-03-04 (×3): 600 mg via ORAL
  Filled 2015-03-03 (×3): qty 1

## 2015-03-03 MED ORDER — PRENATAL MULTIVITAMIN CH: 1.0000 | ORAL_TABLET | Freq: Every day | ORAL | Status: DC

## 2015-03-03 MED ORDER — ONDANSETRON HCL 4 MG/2ML IJ SOLN: 4.0000 mg | INTRAMUSCULAR | Status: DC | PRN

## 2015-03-03 MED ORDER — PROPOFOL 10 MG/ML IV BOLUS
INTRAVENOUS | Status: AC
  Filled 2015-03-03: qty 20

## 2015-03-03 MED ORDER — DOCUSATE SODIUM 100 MG PO CAPS
100.0000 mg | ORAL_CAPSULE | Freq: Two times a day (BID) | ORAL | Status: DC
  Administered 2015-03-03 – 2015-03-04 (×2): 100 mg via ORAL
  Filled 2015-03-03 (×2): qty 1

## 2015-03-03 MED ORDER — DIBUCAINE 1 % RE OINT: 1.0000 "application " | TOPICAL_OINTMENT | RECTAL | Status: DC | PRN

## 2015-03-03 MED ORDER — DEXAMETHASONE SODIUM PHOSPHATE 4 MG/ML IJ SOLN
INTRAMUSCULAR | Status: AC
Start: 2015-03-03 — End: 2015-03-03
  Filled 2015-03-03: qty 1

## 2015-03-03 MED ORDER — DIPHENHYDRAMINE HCL 25 MG PO CAPS: 25.0000 mg | ORAL_CAPSULE | Freq: Four times a day (QID) | ORAL | Status: DC | PRN

## 2015-03-03 MED ORDER — BUPIVACAINE HCL (PF) 0.25 % IJ SOLN
INTRAMUSCULAR | Status: AC
  Filled 2015-03-03: qty 30

## 2015-03-03 MED ORDER — KETOROLAC TROMETHAMINE 30 MG/ML IJ SOLN
30.0000 mg | Freq: Once | INTRAMUSCULAR | Status: AC | PRN
  Administered 2015-03-03: 30 mg via INTRAVENOUS

## 2015-03-03 MED ORDER — WITCH HAZEL-GLYCERIN EX PADS: 1.0000 "application " | MEDICATED_PAD | CUTANEOUS | Status: DC | PRN

## 2015-03-03 MED ORDER — FENTANYL CITRATE (PF) 100 MCG/2ML IJ SOLN: 25.0000 ug | INTRAMUSCULAR | Status: DC | PRN

## 2015-03-03 MED ORDER — ACETAMINOPHEN 325 MG PO TABS
650.0000 mg | ORAL_TABLET | ORAL | Status: DC | PRN
  Administered 2015-03-03 – 2015-03-04 (×3): 650 mg via ORAL
  Filled 2015-03-03 (×3): qty 2

## 2015-03-03 MED ORDER — SODIUM BICARBONATE 8.4 % IV SOLN
INTRAVENOUS | Status: DC | PRN
  Administered 2015-03-03 (×2): 3 mL via EPIDURAL
  Administered 2015-03-03: 4 mL via EPIDURAL
  Administered 2015-03-03: 2 mL via EPIDURAL

## 2015-03-03 MED ORDER — LACTATED RINGERS IV SOLN
INTRAVENOUS | Status: DC | PRN
  Administered 2015-03-03 (×2): via INTRAVENOUS

## 2015-03-03 MED ORDER — OXYTOCIN 40 UNITS IN LACTATED RINGERS INFUSION - SIMPLE MED: 62.5000 mL/h | INTRAVENOUS | Status: DC | PRN

## 2015-03-03 MED ORDER — BENZOCAINE-MENTHOL 20-0.5 % EX AERO
1.0000 "application " | INHALATION_SPRAY | CUTANEOUS | Status: DC | PRN
  Administered 2015-03-03: 1 via TOPICAL
  Filled 2015-03-03: qty 56

## 2015-03-03 MED ORDER — LANOLIN HYDROUS EX OINT: TOPICAL_OINTMENT | CUTANEOUS | Status: DC | PRN

## 2015-03-03 MED ORDER — LIDOCAINE HCL (CARDIAC) 20 MG/ML IV SOLN
INTRAVENOUS | Status: AC
Start: 2015-03-03 — End: 2015-03-03
  Filled 2015-03-03: qty 5

## 2015-03-03 MED ORDER — MEPERIDINE HCL 25 MG/ML IJ SOLN: 6.2500 mg | INTRAMUSCULAR | Status: DC | PRN

## 2015-03-03 MED ORDER — ONDANSETRON HCL 4 MG PO TABS: 4.0000 mg | ORAL_TABLET | ORAL | Status: DC | PRN

## 2015-03-03 MED ORDER — SODIUM BICARBONATE 8.4 % IV SOLN
INTRAVENOUS | Status: AC
  Filled 2015-03-03: qty 50

## 2015-03-03 MED ORDER — KETOROLAC TROMETHAMINE 30 MG/ML IJ SOLN
INTRAMUSCULAR | Status: AC
  Filled 2015-03-03: qty 1

## 2015-03-03 MED ORDER — MIDAZOLAM HCL 2 MG/2ML IJ SOLN
INTRAMUSCULAR | Status: AC
  Filled 2015-03-03: qty 2

## 2015-03-03 MED ORDER — LIDOCAINE-EPINEPHRINE (PF) 2 %-1:200000 IJ SOLN
INTRAMUSCULAR | Status: AC
  Filled 2015-03-03: qty 20

## 2015-03-03 MED ORDER — PROMETHAZINE HCL 25 MG/ML IJ SOLN: 6.2500 mg | INTRAMUSCULAR | Status: DC | PRN

## 2015-03-03 SURGICAL SUPPLY — 24 items
CLIP FILSHIE TUBAL LIGA STRL (Clip) ×3 IMPLANT
CLOTH BEACON ORANGE TIMEOUT ST (SAFETY) ×3 IMPLANT
DRSG OPSITE POSTOP 3X4 (GAUZE/BANDAGES/DRESSINGS) ×3 IMPLANT
DURAPREP 26ML APPLICATOR (WOUND CARE) ×3 IMPLANT
GLOVE BIO SURGEON STRL SZ 6.5 (GLOVE) ×2 IMPLANT
GLOVE BIO SURGEON STRL SZ7 (GLOVE) ×3 IMPLANT
GLOVE BIO SURGEONS STRL SZ 6.5 (GLOVE) ×1
GLOVE BIOGEL PI IND STRL 7.0 (GLOVE) ×7 IMPLANT
GLOVE BIOGEL PI INDICATOR 7.0 (GLOVE) ×14
GOWN STRL REUS W/TWL LRG LVL3 (GOWN DISPOSABLE) ×6 IMPLANT
LIQUID BAND (GAUZE/BANDAGES/DRESSINGS) ×3 IMPLANT
NEEDLE HYPO 22GX1.5 SAFETY (NEEDLE) ×3 IMPLANT
NS IRRIG 1000ML POUR BTL (IV SOLUTION) ×3 IMPLANT
PACK ABDOMINAL MINOR (CUSTOM PROCEDURE TRAY) ×3 IMPLANT
SPONGE GAUZE 2X2 8PLY STER LF (GAUZE/BANDAGES/DRESSINGS) ×1
SPONGE GAUZE 2X2 8PLY STRL LF (GAUZE/BANDAGES/DRESSINGS) ×2 IMPLANT
SPONGE LAP 4X18 X RAY DECT (DISPOSABLE) IMPLANT
SUT VIC AB 0 CT1 27 (SUTURE) ×2
SUT VIC AB 0 CT1 27XBRD ANBCTR (SUTURE) ×1 IMPLANT
SUT VICRYL 4-0 PS2 18IN ABS (SUTURE) ×3 IMPLANT
SYR CONTROL 10ML LL (SYRINGE) ×3 IMPLANT
TOWEL OR 17X24 6PK STRL BLUE (TOWEL DISPOSABLE) ×6 IMPLANT
TRAY FOLEY CATH SILVER 14FR (SET/KITS/TRAYS/PACK) ×3 IMPLANT
WATER STERILE IRR 1000ML POUR (IV SOLUTION) ×3 IMPLANT

## 2015-03-03 NOTE — Transfer of Care (Signed)
Immediate Anesthesia Transfer of Care Note  Patient: Mackenzie Gibson  Procedure(s) Performed: Procedure(s): POST PARTUM TUBAL LIGATION (Bilateral)  Patient Location: PACU  Anesthesia Type:Epidural  Level of Consciousness: awake and alert   Airway & Oxygen Therapy: Patient Spontanous Breathing  Post-op Assessment: Report given to RN and Post -op Vital signs reviewed and stable  Post vital signs: Reviewed  Last Vitals:  Filed Vitals:   03/03/15 1147 03/03/15 1239  BP: 123/74 120/72  Pulse: 91 91  Temp:  36.6 C  Resp: 16 20    Complications: No apparent anesthesia complications

## 2015-03-03 NOTE — Anesthesia Postprocedure Evaluation (Signed)
Anesthesia Post Note  Patient: Mackenzie Gibson  Procedure(s) Performed: * No procedures listed *  Patient location during evaluation: Mother Baby Level of consciousness: awake and alert Pain management: pain level controlled Vital Signs Assessment: post-procedure vital signs reviewed and stable Respiratory status: spontaneous breathing Cardiovascular status: stable Postop Assessment: no signs of nausea or vomiting, adequate PO intake, patient able to bend at knees, no headache, no backache and epidural receding Anesthetic complications: no    Last Vitals:  Filed Vitals:   03/03/15 1500 03/03/15 1524  BP: 123/75 115/65  Pulse: 84 84  Temp: 36.8 C 36.7 C  Resp: 20 20    Last Pain:  Filed Vitals:   03/03/15 1525  PainSc: 5                  Hubbard Robinson

## 2015-03-03 NOTE — Progress Notes (Signed)
SROM @ [id number] blood tinged fluid noted, blood tinged urine also noted in urinary drainage bag.Marland KitchenMarland KitchenCresenzo-Dishmon, CNM aware.

## 2015-03-03 NOTE — Anesthesia Postprocedure Evaluation (Signed)
Anesthesia Post Note  Patient: Mackenzie Gibson  Procedure(s) Performed: Procedure(s) (LRB): POST PARTUM TUBAL LIGATION (Bilateral)  Patient location during evaluation: PACU Anesthesia Type: Epidural Level of consciousness: awake Pain management: pain level controlled Vital Signs Assessment: post-procedure vital signs reviewed and stable Respiratory status: spontaneous breathing Cardiovascular status: stable Postop Assessment: no headache, no backache, epidural receding, patient able to bend at knees and no signs of nausea or vomiting Anesthetic complications: no    Last Vitals:  Filed Vitals:   03/03/15 1415 03/03/15 1430  BP: 123/78 119/70  Pulse: 90 85  Temp:    Resp: 19 20    Last Pain:  Filed Vitals:   03/03/15 1458  PainSc: [place]

## 2015-03-03 NOTE — Progress Notes (Signed)
   Mackenzie Gibson is a 29 y.o. [phone] at [redacted]w[redacted]d  admitted for induction of labor due to Hypertension.  Subjective:  Comfortable with epidural Objective: Filed Vitals:   03/03/15 0203 03/03/15 0233 03/03/15 0303 03/03/15 0333  BP: 121/73 123/68 118/74 125/70  Pulse: 82 95 91 90  Temp:      TempSrc:      Resp:      Height:      Weight:          FHT:  FHR: 120 bpm, variability: moderate,  accelerations:  Present,  decelerations:  Absent UC:   regular, every 2-3 minutes SVE:   Dilation: 3.5 Effacement (%): 50 Station: -3 Exam by:: McSween, RN Pitocin @ 20 mu/min  Labs: Lab Results  Component Value Date   WBC 9.9 03/02/2015   HGB 13.1 03/02/2015   HCT 36.6 03/02/2015   MCV 88.2 03/02/2015   PLT 166 03/02/2015    Assessment / Plan:  IOL for CHTN, slow to get into labor Labor: Progressing normally Fetal Wellbeing:  Category I Pain Control:  Epidural Anticipated MOD:  NSVD  CRESENZO-DISHMAN,Tiajah Oyster 03/03/2015, 4:32 AM

## 2015-03-03 NOTE — Addendum Note (Signed)
Addendum  created 03/03/15 1627 by Hubbard Robinson, CRNA   Modules edited: Clinical Notes   Clinical Notes:  File: [id number]

## 2015-03-03 NOTE — Anesthesia Preprocedure Evaluation (Signed)
Anesthesia Evaluation  Patient identified by MRN, date of birth, ID band Patient awake    Reviewed: Allergy & Precautions, H&P , NPO status , Patient's Chart, lab work & pertinent test results  Airway Mallampati: II  TM Distance: >3 FB Neck ROM: full    Dental no notable dental hx.    Pulmonary Current Smoker,    Pulmonary exam normal        Cardiovascular hypertension, Normal cardiovascular exam     Neuro/Psych negative neurological ROS  negative psych ROS   GI/Hepatic negative GI ROS, Neg liver ROS,   Endo/Other  Morbid obesity  Renal/GU negative Renal ROS     Musculoskeletal   Abdominal (+) + obese,   Peds  Hematology negative hematology ROS (+)   Anesthesia Other Findings   Reproductive/Obstetrics (+) Pregnancy                             Anesthesia Physical Anesthesia Plan  ASA: III  Anesthesia Plan: Epidural   Post-op Pain Management:    Induction:   Airway Management Planned:   Additional Equipment:   Intra-op Plan:   Post-operative Plan:   Informed Consent: I have reviewed the patients History and Physical, chart, labs and discussed the procedure including the risks, benefits and alternatives for the proposed anesthesia with the patient or authorized representative who has indicated his/her understanding and acceptance.     Plan Discussed with: CRNA and Surgeon  Anesthesia Plan Comments: (Will use existing epidural in place from labor.)        Anesthesia Quick Evaluation

## 2015-03-03 NOTE — Anesthesia Postprocedure Evaluation (Signed)
Anesthesia Post Note  Patient: Mackenzie Gibson  Procedure(s) Performed: Procedure(s) (LRB): POST PARTUM TUBAL LIGATION (Bilateral)  Patient location during evaluation: Mother Baby Anesthesia Type: Epidural Level of consciousness: awake and alert, oriented and patient cooperative Pain management: pain level controlled Vital Signs Assessment: post-procedure vital signs reviewed and stable Respiratory status: spontaneous breathing Cardiovascular status: stable Postop Assessment: no headache, no backache, epidural receding, patient able to bend at knees, no signs of nausea or vomiting and adequate PO intake Anesthetic complications: no    Last Vitals:  Filed Vitals:   03/03/15 1500 03/03/15 1524  BP: 123/75 115/65  Pulse: 84 84  Temp: 36.8 C 36.7 C  Resp: 20 20    Last Pain:  Filed Vitals:   03/03/15 1525  PainSc: 5                  Hubbard Robinson

## 2015-03-03 NOTE — Lactation Note (Signed)
This note was copied from the chart of Mackenzie [place]. Lactation Consultation Note  Initial visit done.  Breastfeeding consultation services and support information given and reviewed with patient.  Baby is 7 hours old and has had one good feeding and several attempts.  Baby has been sleepy and showing no interest.  Assisted mom with placing baby skin to skin in football hold.  Baby not cueing or showing interest.  Several drops of colostrum hand expressed into baby's mouth.  Instructed to watch for feeding cues and call for assist prn.  Patient Name: Mackenzie Gibson Today's Date: 03/03/2015 Reason for consult: Initial assessment   Maternal Data Formula Feeding for Exclusion: No Has patient been taught Hand Expression?: Yes Does the patient have breastfeeding experience prior to this delivery?: Yes  Feeding Feeding Type: Breast Fed  LATCH Score/Interventions Latch: Too sleepy or reluctant, no latch achieved, no sucking elicited. Intervention(s): Skin to skin;Teach feeding cues;Waking techniques  Audible Swallowing: None  Type of Nipple: Everted at rest and after stimulation  Comfort (Breast/Nipple): Soft / non-tender     Hold (Positioning): Assistance needed to correctly position infant at breast and maintain latch. Intervention(s): Breastfeeding basics reviewed;Support Pillows;Position options;Skin to skin  LATCH Score: 5  Lactation Tools Discussed/Used     Consult Status      Mackenzie Gibson 03/03/2015, 5:31 PM

## 2015-03-03 NOTE — Procedures (Addendum)
Mackenzie Gibson  03/02/2015 - 03/03/2015  PREOPERATIVE DIAGNOSIS:  Multiparity, undesired fertility  POSTOPERATIVE DIAGNOSIS:  Multiparity, undesired fertility  PROCEDURE:  Postpartum Bilateral Tubal Sterilization using Filshie Clips   ANESTHESIA:  Epidural and local analgesia using [id number] Marcaine  COMPLICATIONS:  None immediate.  ESTIMATED BLOOD LOSS: 5 ml.  INDICATIONS: 29 y.o. [id number]  with undesired fertility,status post vaginal delivery, desires permanent sterilization.  Other reversible forms of contraception were discussed with patient; she declines all other modalities. Risks of procedure discussed with patient including but not limited to: risk of regret, permanence of method, bleeding, infection, injury to surrounding organs and need for additional procedures.  Failure risk of 0.5-1% with increased risk of ectopic gestation if pregnancy occurs was also discussed with patient.     FINDINGS:  Normal uterus, tubes, and ovaries. Thick peritoneal fat layer.   PROCEDURE DETAILS: The patient was taken to the operating room where her spinal anesthesia was dosed up to surgical level and found to be adequate.  She was then placed in a supine position and prepped and draped in the usual sterile fashion.  After an adequate timeout was performed, attention was turned to the patient's abdomen where a 2cm vertical skin incision was made starting in the umbilical fold and extending caudate. The incision was taken down to the layer of fascia using the scalpel, and fascia was incised, and extended bilaterally. The peritoneum was entered in a sharp fashion. The patient was placed in Trendelenburg.  A moist lap pad was used to move omentum and bowel away until the left fallopian tube was identified and grasped with a Babcock clamp, and followed out to the fimbriated end.  A Filshie clip was placed on the left fallopian tube about 2 cm from the cornu.  A similar process was carried out on the right side  allowing for bilateral tubal sterilization.  Good hemostasis was noted overall.  The instruments were then removed from the patient's abdomen and the fascial incision was repaired with 0 Vicryl, and the skin was closed with a 4-0 Vicryl subcuticular stitch. The patient tolerated the procedure well.  Dermabond also used to help re-approximate skin.  Sponge, lap, and needle counts were correct times two.  The patient was then taken to the recovery room awake, extubated and in stable condition.  [name] MD 03/03/2015 2:13 PM  Attestation of Attending Supervision of Fellow: Evaluation and management procedures were performed by the Fellow under my supervision and collaboration. I have reviewed the Fellow's note and chart, and I agree with the management and plan. I was scrubbed and present for the entire operation.  Guss Bunde., MD

## 2015-03-04 MED ORDER — BENZOCAINE-MENTHOL 20-0.5 % EX AERO: 1.0000 "application " | INHALATION_SPRAY | CUTANEOUS | Status: DC | PRN

## 2015-03-04 MED ORDER — IBUPROFEN 600 MG PO TABS: 600.0000 mg | ORAL_TABLET | Freq: Four times a day (QID) | ORAL | Status: DC
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
## 2015-03-04 NOTE — Discharge Instructions (Signed)

## 2015-03-04 NOTE — Discharge Summary (Signed)
OB Discharge Summary     Patient Name: Mackenzie Gibson DOB: 01/13/1986 MRN: [id number]  Date of admission: 03/02/2015 Delivering MD: Lauretta Chester NILES   Date of discharge: 03/04/2015  Admitting diagnosis: 39.2wks, induction Desires Sterilization Intrauterine pregnancy: [redacted]w[redacted]d     Secondary diagnosis:  Principal Problem:   NSVD (normal spontaneous vaginal delivery) Active Problems:   TOBACCO USER   Bipolar disorder ([place])   Maternal morbid obesity, antepartum ([place])   Asthmatic bronchitis , chronic ([place])   Chronic hypertension in pregnancy   S/P tubal ligation  Additional problems:      Discharge diagnosis: Term Pregnancy Delivered                                                                                                Post partum procedures:postpartum tubal ligation  Augmentation:   Complications: None  Hospital course:  Induction of Labor With Vaginal Delivery   29 y.o. yo [id number] at [redacted]w[redacted]d was admitted to the hospital 03/02/2015 for induction of labor.  Indication for induction: chronic hypertension.  Patient had an uncomplicated labor course as follows: Membrane Rupture Time/Date: 6:15 AM ,03/03/2015   Intrapartum Procedures: Episiotomy: None [1]                                         Lacerations:  None [1]  Patient had delivery of a Viable infant.  Information for the patient's newborn:  Melynn, Kne[place] [id number]  Delivery Method: Vaginal, Spontaneous Delivery (Filed from Delivery Summary)   03/03/2015  Details of delivery can be found in separate delivery note.  Patient had a routine postpartum course. Patient is discharged home 03/04/2015.   Physical exam  Filed Vitals:   03/03/15 1645 03/03/15 2140 03/04/15 0045 03/04/15 0500  BP: 125/67 122/76 123/60 108/62  Pulse: 90 86 84 74  Temp: 98.1 F (36.7 C) 98.2 F (36.8 C) 97.9 F (36.6 C) 97.7 F (36.5 C)  TempSrc: Oral Oral Oral Oral  Resp: 20 20 20 20   Height:      Weight:       SpO2:       General: alert and cooperative Lochia: appropriate Uterine Fundus: firm Incision: Healing well with no significant drainage DVT Evaluation: No evidence of DVT seen on physical exam. Labs: Lab Results  Component Value Date   WBC 9.9 03/02/2015   HGB 13.1 03/02/2015   HCT 36.6 03/02/2015   MCV 88.2 03/02/2015   PLT 166 03/02/2015   CMP Latest Ref Rng 03/02/2015  Glucose 65 - 99 mg/dL 120(H)  BUN 6 - 20 mg/dL 6  Creatinine 0.44 - 1.00 mg/dL 0.61  Sodium 135 - 145 mmol/L 136  Potassium 3.5 - 5.1 mmol/L 3.5  Chloride 101 - 111 mmol/L 106  CO2 22 - 32 mmol/L 21(L)  Calcium 8.9 - 10.3 mg/dL 9.0  Total Protein 6.5 - 8.1 g/dL 6.1(L)  Total Bilirubin 0.3 - 1.2 mg/dL 0.8  Alkaline Phos 38 - 126 U/L 184(H)  AST 15 - 41  U/L 18  ALT 14 - 54 U/L 15    Discharge instruction: per After Visit Summary and "Baby and Me Booklet".  After visit meds:    Medication List    STOP taking these medications        benzocaine-resorcinol 5-2 % vaginal cream  Commonly known as:  VAGISIL     clobetasol cream 0.05 %  Commonly known as:  TEMOVATE     cyclobenzaprine 10 MG tablet  Commonly known as:  FLEXERIL     guaiFENesin 100 MG/5ML liquid  Commonly known as:  ROBITUSSIN      TAKE these medications        acetaminophen 500 MG tablet  Commonly known as:  TYLENOL  Take 2 tablets (1,000 mg total) by mouth every 6 (six) hours as needed for mild pain or headache.     albuterol 108 (90 Base) MCG/ACT inhaler  Commonly known as:  PROVENTIL HFA;VENTOLIN HFA  Inhale 1-2 puffs into the lungs every 6 (six) hours as needed for wheezing or shortness of breath.     beclomethasone 40 MCG/ACT inhaler  Commonly known as:  QVAR  Inhale 2 puffs into the lungs 2 (two) times daily.     benzocaine-Menthol 20-0.5 % Aero  Commonly known as:  DERMOPLAST  Apply 1 application topically as needed for irritation (perineal discomfort).     ibuprofen 600 MG tablet  Commonly known as:   ADVIL,MOTRIN  Take 1 tablet (600 mg total) by mouth every 6 (six) hours.        Diet: routine diet  Activity: Advance as tolerated. Pelvic rest for 6 weeks.   Outpatient follow up:6 weeks Follow up Appt:Future Appointments Date Time Provider [place]  04/13/2015 12:45 PM Lavonia Drafts, MD [facility]   Follow up Visit:No Follow-up on file.  Postpartum contraception: Tubal Ligation  Newborn Data: Live born female  Birth Weight: 8 lb 3.2 oz (3720 g) APGAR: 9, 9  Baby Feeding: Breast Disposition:home with mother   03/04/2015 Mayo Ao, CNM

## 2015-03-07 ENCOUNTER — Other Ambulatory Visit: Payer: Self-pay | Admitting: General Practice

## 2015-03-07 ENCOUNTER — Inpatient Hospital Stay (HOSPITAL_COMMUNITY): Admission: RE | Admit: 2015-03-07 | Payer: Medicaid Other | Source: Ambulatory Visit

## 2015-03-07 ENCOUNTER — Encounter (HOSPITAL_COMMUNITY): Payer: Self-pay | Admitting: Obstetrics & Gynecology

## 2015-03-07 DIAGNOSIS — R52 Pain, unspecified: Principal | ICD-10-CM

## 2015-03-07 DIAGNOSIS — O9089 Other complications of the puerperium, not elsewhere classified: Secondary | ICD-10-CM

## 2015-03-07 MED ORDER — OXYCODONE-ACETAMINOPHEN 5-325 MG PO TABS: 1.0000 | ORAL_TABLET | ORAL | Status: DC | PRN

## 2015-03-07 NOTE — Progress Notes (Unsigned)
Patient came by office requesting percocet for pain. Per Dr Ernestina Patches, may give 5 pills. Patient informed

## 2015-03-07 NOTE — Addendum Note (Signed)
Addendum  created 03/07/15 1438 by Jonna Munro, CRNA   Modules edited: Charges VN

## 2015-03-09 NOTE — Progress Notes (Signed)
This encounter was created in error - please disregard.

## 2015-04-02 ENCOUNTER — Telehealth: Payer: Self-pay | Admitting: Family Medicine

## 2015-04-02 NOTE — Telephone Encounter (Signed)
Family Medicine After hours phone call  Patient called reporting significant eye redness and irritation. Started over the weekend. Originally thought she had a foreign object irritating the eye. Now eye is inflamed and draining constantly. Eye is painful now and sensitive to light. Draining into nose now and causing foul taste. Contralateral eye in completely asymptomatic. Patient is febrile. Asking what to do.  Encouraged patient to be seen at nearby UC center. Diagnosis over the phone is very difficult. Patient asked if it was worth going tonight. I informed her that if her eye is painful and/or her vision is altered at all in the effected eye then she needs to be seen tonight.  Patient stated her understanding and compliance with this advice.  Patient had no further questions.  DDx: persistent foreign body vs. Bacterial/viral corneal/conjunctival infection vs. Optic neuritis vs. Sty vs. dacryocystitis  Elberta Leatherwood, MD,MS,  PGY2 04/02/2015 11:50 PM

## 2015-04-03 ENCOUNTER — Telehealth: Payer: Self-pay

## 2015-04-03 NOTE — Telephone Encounter (Signed)
Pt called and wanted to know what she can take for constipation.  Called pt and informed her that she can use Miralax and/or a Stool Softner 100 mg tablet twice a day.  Pt also stated that she also has hemorroids, I advised her to take preparation H to get relief.  Pt stated understanding with no further questions.

## 2015-04-04 ENCOUNTER — Encounter: Payer: Self-pay | Admitting: Family Medicine

## 2015-04-04 ENCOUNTER — Telehealth: Payer: Self-pay | Admitting: Family Medicine

## 2015-04-04 ENCOUNTER — Ambulatory Visit (INDEPENDENT_AMBULATORY_CARE_PROVIDER_SITE_OTHER): Payer: Medicaid Other | Admitting: Family Medicine

## 2015-04-04 VITALS — BP 123/78 | HR 80 | Temp 97.9°F | Wt 234.0 lb

## 2015-04-04 DIAGNOSIS — H109 Unspecified conjunctivitis: Secondary | ICD-10-CM | POA: Diagnosis not present

## 2015-04-04 DIAGNOSIS — R1031 Right lower quadrant pain: Secondary | ICD-10-CM

## 2015-04-04 LAB — POCT URINALYSIS DIP (MANUAL ENTRY)
Bilirubin, UA: NEGATIVE
Glucose, UA: NEGATIVE
Ketones, POC UA: NEGATIVE
Leukocytes, UA: NEGATIVE
NITRITE UA: NEGATIVE
PH UA: 7
Protein Ur, POC: NEGATIVE
SPEC GRAV UA: 1.02
UROBILINOGEN UA: 0.2

## 2015-04-04 LAB — POCT UA - MICROSCOPIC ONLY

## 2015-04-04 MED ORDER — POLYETHYLENE GLYCOL 3350 17 GM/SCOOP PO POWD: 17.0000 g | Freq: Two times a day (BID) | ORAL | Status: DC | PRN

## 2015-04-04 NOTE — Telephone Encounter (Signed)
Called patient to discuss UA. No answer. Left voicemail with callback number.   No signs of infection though did have blood possibly suggesting kidney stones. No specific intervention is needed at this time but she should come back if she has significant pain not relieved by medications, fevers, or chills.  Algis Greenhouse. Jerline Pain, [place] Resident PGY-2 04/04/2015 1:46 PM

## 2015-04-04 NOTE — Patient Instructions (Signed)
Continue the eye drops. If it is not getting better after 10 days let us know.  Please start miralax so that you have a bowel movement everyday. We will check your urine for infection today.  Take care,  Dr Jerline Pain

## 2015-04-04 NOTE — Telephone Encounter (Signed)
LM on VM ok per DPR. Jazmin Hartsell,CMA

## 2015-04-04 NOTE — Progress Notes (Signed)
Subjective:  Mackenzie Gibson is a 30 y.o. female who presents to the [facility] today for same day appointment with a chief complaint of conjunctivitis follow up and right side abdominal pain.   HPI:  Left Eye Conjunctivitis Symptoms started 4 days ago with a foreign body sensation. Then became red and swollen with discharge. She called our after-hours line and was directed to go to urgent care or the ED. Patient reports being prescribed eye drops which ave been helping with her symptoms. Since starting eye drops, she has not had any more discharge or redness. She still has a mild foreign body sensation. No vision changes. No pain with eye movement. Patient thinks she contracted conjunctivitis due to manually disimpacting herself due to severe constipation.   Constipation / Abdominal Pain Patient has been constipated since delivering her baby. Patient is 1 month post partum. She was started on colace 2 days ago but has not noticed much difference. Last had BM 2 days ago. Also noticed right sided abdominal pain over the past couple of days. No fevers or chills. No dysuria. Has not taken percocet since leaving hospital. Has not tried any other medications for constipation.     ROS: Per HPI  PMH:  The following were reviewed and entered/updated in epic: Past Medical History  Diagnosis Date  . PCOS (polycystic ovarian syndrome)   . Depression   . Bipolar disorder ([place])   . Bipolar disorder ([place])   . Anxiety   . Pregnancy induced hypertension    Patient Active Problem List   Diagnosis Date Noted  . NSVD (normal spontaneous vaginal delivery) 03/03/2015  . S/P tubal ligation 03/03/2015  . 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 Asthmatic bronchitis , chronic ([place]) 02/09/2015  . Supervision of high-risk pregnancy 10/25/2014  . Maternal morbid obesity, antepartum ([place])   . Chronic hypertension complicating or reason for care during pregnancy 07/28/2014  . Exposure to STD  07/28/2014  . Hx of preeclampsia, prior pregnancy, currently pregnant 07/28/2014  . Bipolar disorder ([place]) 01/12/2014  . Chronic pelvic pain in female 01/12/2014  . Eczema, dyshidrotic 12/20/2013  . TOBACCO USER 03/06/2009  . SOMATIZATION DISORDER 07/27/2008  . PANIC DISORDER 10/20/2006  . POLYCYSTIC OVARIAN DISEASE 03/04/2004   Past Surgical History  Procedure Laterality Date  . Cholecystectomy    . Eye surgery    . Tubal ligation Bilateral 03/03/2015    Procedure: POST PARTUM TUBAL LIGATION;  Surgeon: Guss Bunde, MD;  Location: [facility];  Service: Gynecology;  Laterality: Bilateral;    Objective:  Physical Exam: BP 123/78 mmHg  Pulse 80  Temp(Src) 97.9 F (36.6 C) (Oral)  Wt 234 lb (106.142 kg)  Gen: NAD, resting comfortably HEENT: Disconjugate gaze noted. Left conjunctiva mildly injected with no discharge noted. EOMI with no pain. Right conjunctive mildly injected with no discharge. EOMI with no pain.  CV: RRR with no murmurs appreciated Pulm: NWOB, CTAB with no crackles, wheezes, or rhonchi GI: Limited exam due to body habitus. Normal bowel sounds present. Soft, Nontender, Nondistended. MSK: no edema, cyanosis, or clubbing noted. No CVA tenderness.  Skin: warm, dry Neuro: grossly normal, moves all extremities Psych: Normal affect and thought content  Results for orders placed or performed in visit on 04/04/15 (from the past 72 hour(s))  POCT urinalysis dipstick     Status: Abnormal   Collection Time: 04/04/15 11:59 AM  Result Value Ref Range   Color, UA yellow yellow   Clarity, UA clear clear  Glucose, UA negative negative   Bilirubin, UA negative negative   Ketones, POC UA negative negative   Spec Grav, UA 1.020    Blood, UA large (A) negative   pH, UA 7.0    Protein Ur, POC negative negative   Urobilinogen, UA 0.2    Nitrite, UA Negative Negative   Leukocytes, UA Negative Negative     Assessment/Plan:  Conjunctivitis Doing well on eyedrops prescribed  by ED. No red flag signs or symptoms. Encouraged patient to finish full course of eye drops.  Abdominal Pain / Constipation Abdominal pain likely due to constipation. No red flag signs or symptoms. UA negative. Will start daily miralax and continue colace with goal of soft bowel movement everyday. Return precautions reviewed.   Algis Greenhouse. Jerline Pain, [place] [facility] Resident PGY-2 04/04/2015 12:23 PM

## 2015-04-13 ENCOUNTER — Ambulatory Visit: Payer: Medicaid Other | Admitting: Obstetrics & Gynecology

## 2015-04-25 ENCOUNTER — Encounter: Payer: Self-pay | Admitting: *Deleted

## 2015-04-25 ENCOUNTER — Ambulatory Visit: Payer: Medicaid Other | Admitting: Advanced Practice Midwife

## 2015-04-25 NOTE — Progress Notes (Signed)
This encounter was created in error - please disregard.

## 2015-05-15 ENCOUNTER — Ambulatory Visit (INDEPENDENT_AMBULATORY_CARE_PROVIDER_SITE_OTHER): Payer: Medicaid Other | Admitting: Family Medicine

## 2015-05-15 ENCOUNTER — Encounter: Payer: Self-pay | Admitting: Family Medicine

## 2015-05-15 VITALS — BP 136/86 | HR 70 | Temp 98.0°F | Wt 235.5 lb

## 2015-05-15 DIAGNOSIS — E069 Thyroiditis, unspecified: Secondary | ICD-10-CM | POA: Diagnosis not present

## 2015-05-15 LAB — COMPLETE METABOLIC PANEL WITH GFR
ALBUMIN: 4.4 g/dL (ref 3.6–5.1)
ALK PHOS: 83 U/L (ref 33–115)
ALT: 20 U/L (ref 6–29)
AST: 16 U/L (ref 10–30)
BILIRUBIN TOTAL: 0.4 mg/dL (ref 0.2–1.2)
BUN: 12 mg/dL (ref 7–25)
CALCIUM: 9.6 mg/dL (ref 8.6–10.2)
CO2: 27 mmol/L (ref 20–31)
Chloride: 104 mmol/L (ref 98–110)
Creat: 0.66 mg/dL (ref 0.50–1.10)
GFR, Est African American: >89 mL/min (ref >=60)
GFR, Est Non African American: >89 mL/min (ref >=60)
GLUCOSE: 78 mg/dL (ref 65–99)
POTASSIUM: 4.4 mmol/L (ref 3.5–5.3)
Sodium: 140 mmol/L (ref 135–146)
TOTAL PROTEIN: 7 g/dL (ref 6.1–8.1)

## 2015-05-15 LAB — T3, FREE: T3, Free: 3.4 pg/mL (ref 2.3–4.2)

## 2015-05-15 LAB — T4, FREE: FREE T4: 1.1 ng/dL (ref 0.8–1.8)

## 2015-05-15 LAB — TSH: TSH: 3.15 mIU/L

## 2015-05-15 NOTE — Patient Instructions (Addendum)
Thanks for coming in today.   I do not think that you have bacterial strep throat.   It is most likely viral, but it could also be your thyroid. This is why we checked your thyroid levels today. I will call you with those results.   Otherwise, continue the supportive care that we talked about.    Thanks for letting us take care of you.   Sincerely, Paula Compton, MD  Pharyngitis Pharyngitis is redness, pain, and swelling (inflammation) of your pharynx.  CAUSES  Pharyngitis is usually caused by infection. Most of the time, these infections are from viruses (viral) and are part of a cold. However, sometimes pharyngitis is caused by bacteria (bacterial). Pharyngitis can also be caused by allergies. Viral pharyngitis may be spread from person to person by coughing, sneezing, and personal items or utensils (cups, forks, spoons, toothbrushes). Bacterial pharyngitis may be spread from person to person by more intimate contact, such as kissing.  SIGNS AND SYMPTOMS  Symptoms of pharyngitis include:   Sore throat.   Tiredness (fatigue).   Low-grade fever.   Headache.  Joint pain and muscle aches.  Skin rashes.  Swollen lymph nodes.  Plaque-like film on throat or tonsils (often seen with bacterial pharyngitis). DIAGNOSIS  Your health care provider will ask you questions about your illness and your symptoms. Your medical history, along with a physical exam, is often all that is needed to diagnose pharyngitis. Sometimes, a rapid strep test is done. Other lab tests may also be done, depending on the suspected cause.  TREATMENT  Viral pharyngitis will usually get better in 3-4 days without the use of medicine. Bacterial pharyngitis is treated with medicines that kill germs (antibiotics).  HOME CARE INSTRUCTIONS   Drink enough water and fluids to keep your urine clear or pale yellow.   Only take over-the-counter or prescription medicines as directed by your health care provider:    If you are prescribed antibiotics, make sure you finish them even if you start to feel better.   Do not take aspirin.   Get lots of rest.   Gargle with 8 oz of salt water ( tsp of salt per 1 qt of water) as often as every 1-2 hours to soothe your throat.   Throat lozenges (if you are not at risk for choking) or sprays may be used to soothe your throat. SEEK MEDICAL CARE IF:   You have large, tender lumps in your neck.  You have a rash.  You cough up green, yellow-brown, or bloody spit. SEEK IMMEDIATE MEDICAL CARE IF:   Your neck becomes stiff.  You drool or are unable to swallow liquids.  You vomit or are unable to keep medicines or liquids down.  You have severe pain that does not go away with the use of recommended medicines.  You have trouble breathing (not caused by a stuffy nose). MAKE SURE YOU:   Understand these instructions.  Will watch your condition.  Will get help right away if you are not doing well or get worse.   This information is not intended to replace advice given to you by your health care provider. Make sure you discuss any questions you have with your health care provider.   Document Released: 02/18/2005 Document Revised: 12/09/2012 Document Reviewed: 10/26/2012 Elsevier Interactive Patient Education [employer].

## 2015-05-15 NOTE — Progress Notes (Signed)
Patient ID: Mackenzie Gibson, female   DOB: 05/22/1985, 30 y.o.   MRN: [id number]   [facility] Aquilla Hacker, MD Phone: [phone]  Subjective:   # Sore throat  - symptoms for 4 days.  - No fever, no chills, no nasal drainage, no discharge, no ear pain.  - Mild headaches, no vision changes, she does complain of intermittent dizziness.  - No SOB, no cough.  - Has not been taking anything for it.  - she says it hurts whenever she swallows.  - Additionally, she has very tender neck, recent pregnancy with deliver at the end of December.  -She has been losing weight, has been on / off hot / cold. She denies diarrhea and actually complains of intermittent constipation.  - She says she has not noticed any palpitations or heart racing symptoms.  - She denies night sweats.  - she continues to smoke 1/2 ppd.  - She denies other substances.   All relevant systems were reviewed and were negative unless otherwise noted in the HPI  Past Medical History Reviewed problem list.  Medications- reviewed and updated Current Outpatient Prescriptions  Medication Sig Dispense Refill  . acetaminophen (TYLENOL) 500 MG tablet Take 2 tablets (1,000 mg total) by mouth every 6 (six) hours as needed for mild pain or headache. 60 tablet 3  . albuterol (PROVENTIL HFA;VENTOLIN HFA) 108 (90 BASE) MCG/ACT inhaler Inhale 1-2 puffs into the lungs every 6 (six) hours as needed for wheezing or shortness of breath. 18 g 5  . beclomethasone (QVAR) 40 MCG/ACT inhaler Inhale 2 puffs into the lungs 2 (two) times daily. 1 Inhaler 12  . ibuprofen (ADVIL,MOTRIN) 600 MG tablet Take 1 tablet (600 mg total) by mouth every 6 (six) hours. 30 tablet 0  . polyethylene glycol powder (GLYCOLAX/MIRALAX) powder Take 17 g by mouth 2 (two) times daily as needed. 3350 g 1  . benzocaine-Menthol (DERMOPLAST) 20-0.5 % AERO Apply 1 application topically as needed for irritation (perineal discomfort). (Patient not taking:  Reported on 04/04/2015) 1 each 1  . oxyCODONE-acetaminophen (PERCOCET/ROXICET) 5-325 MG tablet Take 1 tablet by mouth every 4 (four) hours as needed for severe pain. (Patient not taking: Reported on 04/04/2015) 5 tablet 0   No current facility-administered medications for this visit.   Chief complaint-noted No additions to family history Social history- patient is a current 1/2 ppd smoker  Objective: BP 136/86 mmHg  Pulse 70  Temp(Src) 98 F (36.7 C) (Oral)  Wt 235 lb 8 oz (106.822 kg) Gen: NAD, alert, cooperative with exam HEENT: NCAT, EOMI, PERRL, TMs nml, no erythema. No bulging. O/P clear and without erythema or exudates.  Neck: FROM, supple, TTP over her thyroid. Thyroid also feels slightly enlarged, no nodules. Pain with swallowing. No LAD palpable.  CV: RRR, good S1/S2, no murmur Resp: CTABL, no wheezes, non-labored Abd: SNTND, BS present, no guarding or organomegaly Ext: No edema, warm, normal tone, moves UE/LE spontaneously Neuro: Alert and oriented, No gross deficits Skin: no rashes no lesions  Assessment/Plan:  # Viral Pharyngitis - this appears to be the most likely diagnosis, though given her degree of thyroid tenderness, this certainly deserves investigation, no nodules.  - Check TSH, T3, T4 today.  - Supportive care otherwise with ibuprofen, plenty of liquids, lozenges as needed for pain.  - Will f/ u on thyroid tests above.  - Has a postpartum visit coming up and also wants A1C checked as she feels that she might be diabetic.  -  Return precautions reviewed in the event that she may develop a bacterial infection.  - Otherwise f/u prn.

## 2015-05-16 ENCOUNTER — Telehealth: Payer: Self-pay | Admitting: Family Medicine

## 2015-05-16 ENCOUNTER — Encounter: Payer: Self-pay | Admitting: *Deleted

## 2015-05-16 NOTE — Telephone Encounter (Signed)
Called with normal thyroid testing. Likely just viral pharyngitis. Left VM as patient did not aswer. Everything is normal. Call back with questions.   CGM MD

## 2015-05-16 NOTE — Telephone Encounter (Signed)
Spoke with patient and she is aware of results. Jazmin Hartsell,CMA  

## 2015-05-25 ENCOUNTER — Ambulatory Visit (INDEPENDENT_AMBULATORY_CARE_PROVIDER_SITE_OTHER): Payer: Medicaid Other | Admitting: Obstetrics & Gynecology

## 2015-05-25 ENCOUNTER — Encounter: Payer: Self-pay | Admitting: Obstetrics & Gynecology

## 2015-05-25 NOTE — Patient Instructions (Signed)
Breastfeeding Deciding to breastfeed is one of the best choices you can make for you and your baby. A change in hormones during pregnancy causes your breast tissue to grow and increases the number and size of your milk ducts. These hormones also allow proteins, sugars, and fats from your blood supply to make breast milk in your milk-producing glands. Hormones prevent breast milk from being released before your baby is born as well as prompt milk flow after birth. Once breastfeeding has begun, thoughts of your baby, as well as his or her sucking or crying, can stimulate the release of milk from your milk-producing glands.  BENEFITS OF BREASTFEEDING For Your Baby  Your first milk (colostrum) helps your baby's digestive system function better.  There are antibodies in your milk that help your baby fight off infections.  Your baby has a lower incidence of asthma, allergies, and sudden infant death syndrome.  The nutrients in breast milk are better for your baby than infant formulas and are designed uniquely for your baby's needs.  Breast milk improves your baby's brain development.  Your baby is less likely to develop other conditions, such as childhood obesity, asthma, or type 2 diabetes mellitus. For You  Breastfeeding helps to create a very special bond between you and your baby.  Breastfeeding is convenient. Breast milk is always available at the correct temperature and costs nothing.  Breastfeeding helps to burn calories and helps you lose the weight gained during pregnancy.  Breastfeeding makes your uterus contract to its prepregnancy size faster and slows bleeding (lochia) after you give birth.   Breastfeeding helps to lower your risk of developing type 2 diabetes mellitus, osteoporosis, and breast or ovarian cancer later in life. SIGNS THAT YOUR BABY IS HUNGRY Early Signs of Hunger  Increased alertness or activity.  Stretching.  Movement of the head from side to  side.  Movement of the head and opening of the mouth when the corner of the mouth or cheek is stroked (rooting).  Increased sucking sounds, smacking lips, cooing, sighing, or squeaking.  Hand-to-mouth movements.  Increased sucking of fingers or hands. Late Signs of Hunger  Fussing.  Intermittent crying. Extreme Signs of Hunger Signs of extreme hunger will require calming and consoling before your baby will be able to breastfeed successfully. Do not wait for the following signs of extreme hunger to occur before you initiate breastfeeding:  Restlessness.  A loud, strong cry.  Screaming. BREASTFEEDING BASICS Breastfeeding Initiation  Find a comfortable place to sit or lie down, with your neck and back well supported.  Place a pillow or rolled up blanket under your baby to bring him or her to the level of your breast (if you are seated). Nursing pillows are specially designed to help support your arms and your baby while you breastfeed.  Make sure that your baby's abdomen is facing your abdomen.  Gently massage your breast. With your fingertips, massage from your chest wall toward your nipple in a circular motion. This encourages milk flow. You may need to continue this action during the feeding if your milk flows slowly.  Support your breast with 4 fingers underneath and your thumb above your nipple. Make sure your fingers are well away from your nipple and your baby's mouth.  Stroke your baby's lips gently with your finger or nipple.  When your baby's mouth is open wide enough, quickly bring your baby to your breast, placing your entire nipple and as much of the colored area around your nipple (  areola) as possible into your baby's mouth.  More areola should be visible above your baby's upper lip than below the lower lip.  Your baby's tongue should be between his or her lower gum and your breast.  Ensure that your baby's mouth is correctly positioned around your nipple  (latched). Your baby's lips should create a seal on your breast and be turned out (everted).  It is common for your baby to suck about 2-3 minutes in order to start the flow of breast milk. Latching Teaching your baby how to latch on to your breast properly is very important. An improper latch can cause nipple pain and decreased milk supply for you and poor weight gain in your baby. Also, if your baby is not latched onto your nipple properly, he or she may swallow some air during feeding. This can make your baby fussy. Burping your baby when you switch breasts during the feeding can help to get rid of the air. However, teaching your baby to latch on properly is still the best way to prevent fussiness from swallowing air while breastfeeding. Signs that your baby has successfully latched on to your nipple:  Silent tugging or silent sucking, without causing you pain.  Swallowing heard between every 3-4 sucks.  Muscle movement above and in front of his or her ears while sucking. Signs that your baby has not successfully latched on to nipple:  Sucking sounds or smacking sounds from your baby while breastfeeding.  Nipple pain. If you think your baby has not latched on correctly, slip your finger into the corner of your baby's mouth to break the suction and place it between your baby's gums. Attempt breastfeeding initiation again. Signs of Successful Breastfeeding Signs from your baby:  A gradual decrease in the number of sucks or complete cessation of sucking.  Falling asleep.  Relaxation of his or her body.  Retention of a small amount of milk in his or her mouth.  Letting go of your breast by himself or herself. Signs from you:  Breasts that have increased in firmness, weight, and size 1-3 hours after feeding.  Breasts that are softer immediately after breastfeeding.  Increased milk volume, as well as a change in milk consistency and color by the fifth day of breastfeeding.  Nipples  that are not sore, cracked, or bleeding. Signs That Your Baby is Getting Enough Milk  Wetting at least 3 diapers in a 24-hour period. The urine should be clear and pale yellow by age 5 days.  At least 3 stools in a 24-hour period by age 5 days. The stool should be soft and yellow.  At least 3 stools in a 24-hour period by age 7 days. The stool should be seedy and yellow.  No loss of weight greater than 10% of birth weight during the first 3 days of age.  Average weight gain of 4-7 ounces (113-198 g) per week after age 4 days.  Consistent daily weight gain by age 5 days, without weight loss after the age of 2 weeks. After a feeding, your baby may spit up a small amount. This is common. BREASTFEEDING FREQUENCY AND DURATION Frequent feeding will help you make more milk and can prevent sore nipples and breast engorgement. Breastfeed when you feel the need to reduce the fullness of your breasts or when your baby shows signs of hunger. This is called "breastfeeding on demand." Avoid introducing a pacifier to your baby while you are working to establish breastfeeding (the first 4-6 weeks   after your baby is born). After this time you may choose to use a pacifier. Research has shown that pacifier use during the first year of a baby's life decreases the risk of sudden infant death syndrome (SIDS). Allow your baby to feed on each breast as long as he or she wants. Breastfeed until your baby is finished feeding. When your baby unlatches or falls asleep while feeding from the first breast, offer the second breast. Because newborns are often sleepy in the first few weeks of life, you may need to awaken your baby to get him or her to feed. Breastfeeding times will vary from baby to baby. However, the following rules can serve as a guide to help you ensure that your baby is properly fed:  Newborns (babies 4 weeks of age or younger) may breastfeed every 1-3 hours.  Newborns should not go longer than 3 hours  during the day or 5 hours during the night without breastfeeding.  You should breastfeed your baby a minimum of 8 times in a 24-hour period until you begin to introduce solid foods to your baby at around 6 months of age. BREAST MILK PUMPING Pumping and storing breast milk allows you to ensure that your baby is exclusively fed your breast milk, even at times when you are unable to breastfeed. This is especially important if you are going back to work while you are still breastfeeding or when you are not able to be present during feedings. Your lactation consultant can give you guidelines on how long it is safe to store breast milk. A breast pump is a machine that allows you to pump milk from your breast into a sterile bottle. The pumped breast milk can then be stored in a refrigerator or freezer. Some breast pumps are operated by hand, while others use electricity. Ask your lactation consultant which type will work best for you. Breast pumps can be purchased, but some hospitals and breastfeeding support groups lease breast pumps on a monthly basis. A lactation consultant can teach you how to hand express breast milk, if you prefer not to use a pump. CARING FOR YOUR BREASTS WHILE YOU BREASTFEED Nipples can become dry, cracked, and sore while breastfeeding. The following recommendations can help keep your breasts moisturized and healthy:  Avoid using soap on your nipples.  Wear a supportive bra. Although not required, special nursing bras and tank tops are designed to allow access to your breasts for breastfeeding without taking off your entire bra or top. Avoid wearing underwire-style bras or extremely tight bras.  Air dry your nipples for 3-4minutes after each feeding.  Use only cotton bra pads to absorb leaked breast milk. Leaking of breast milk between feedings is normal.  Use lanolin on your nipples after breastfeeding. Lanolin helps to maintain your skin's normal moisture barrier. If you use  pure lanolin, you do not need to wash it off before feeding your baby again. Pure lanolin is not toxic to your baby. You may also hand express a few drops of breast milk and gently massage that milk into your nipples and allow the milk to air dry. In the first few weeks after giving birth, some women experience extremely full breasts (engorgement). Engorgement can make your breasts feel heavy, warm, and tender to the touch. Engorgement peaks within 3-5 days after you give birth. The following recommendations can help ease engorgement:  Completely empty your breasts while breastfeeding or pumping. You may want to start by applying warm, moist heat (in   the shower or with warm water-soaked hand towels) just before feeding or pumping. This increases circulation and helps the milk flow. If your baby does not completely empty your breasts while breastfeeding, pump any extra milk after he or she is finished.  Wear a snug bra (nursing or regular) or tank top for 1-2 days to signal your body to slightly decrease milk production.  Apply ice packs to your breasts, unless this is too uncomfortable for you.  Make sure that your baby is latched on and positioned properly while breastfeeding. If engorgement persists after 48 hours of following these recommendations, contact your health care provider or a lactation consultant. OVERALL HEALTH CARE RECOMMENDATIONS WHILE BREASTFEEDING  Eat healthy foods. Alternate between meals and snacks, eating 3 of each per day. Because what you eat affects your breast milk, some of the foods may make your baby more irritable than usual. Avoid eating these foods if you are sure that they are negatively affecting your baby.  Drink milk, fruit juice, and water to satisfy your thirst (about 10 glasses a day).  Rest often, relax, and continue to take your prenatal vitamins to prevent fatigue, stress, and anemia.  Continue breast self-awareness checks.  Avoid chewing and smoking  tobacco. Chemicals from cigarettes that pass into breast milk and exposure to secondhand smoke may harm your baby.  Avoid alcohol and drug use, including marijuana. Some medicines that may be harmful to your baby can pass through breast milk. It is important to ask your health care provider before taking any medicine, including all over-the-counter and prescription medicine as well as vitamin and herbal supplements. It is possible to become pregnant while breastfeeding. If birth control is desired, ask your health care provider about options that will be safe for your baby. SEEK MEDICAL CARE IF:  You feel like you want to stop breastfeeding or have become frustrated with breastfeeding.  You have painful breasts or nipples.  Your nipples are cracked or bleeding.  Your breasts are red, tender, or warm.  You have a swollen area on either breast.  You have a fever or chills.  You have nausea or vomiting.  You have drainage other than breast milk from your nipples.  Your breasts do not become full before feedings by the fifth day after you give birth.  You feel sad and depressed.  Your baby is too sleepy to eat well.  Your baby is having trouble sleeping.   Your baby is wetting less than 3 diapers in a 24-hour period.  Your baby has less than 3 stools in a 24-hour period.  Your baby's skin or the white part of his or her eyes becomes yellow.   Your baby is not gaining weight by 5 days of age. SEEK IMMEDIATE MEDICAL CARE IF:  Your baby is overly tired (lethargic) and does not want to wake up and feed.  Your baby develops an unexplained fever.   This information is not intended to replace advice given to you by your health care provider. Make sure you discuss any questions you have with your health care provider.   Document Released: 02/18/2005 Document Revised: 11/09/2014 Document Reviewed: 08/12/2012 Elsevier Interactive Patient Education 2016 [employer]  

## 2015-05-25 NOTE — Progress Notes (Signed)
Subjective:     Mackenzie Gibson is a 30 y.o. female who presents for a postpartum visit. She is 11 weeks postpartum following a spontaneous vaginal delivery. I have fully reviewed the prenatal and intrapartum course. The delivery was at 60 gestational weeks. Outcome: spontaneous vaginal delivery. Anesthesia: epidural. Postpartum course has been complicated by some dizziness . Baby's course has been good. Baby is feeding by breast. Bleeding no bleeding. Bowel function is abnormal: constipation. Bladder function is normal. Patient is not sexually active. Contraception method is tubal ligation. Postpartum depression screening: negative.  The following portions of the patient's history were reviewed and updated as appropriate: allergies, current medications, past family history, past medical history, past social history, past surgical history and problem list.  Review of Systems Pertinent items are noted in HPI.   Objective:    BP 116/78 mmHg  Pulse 72  Temp(Src) 98.1 F (36.7 C)  Wt 238 lb 14.4 oz (108.364 kg)  General:  alert, cooperative and no distress           Abdomen: soft, non-tender; bowel sounds normal; no masses,  no organomegaly and umbilical incision well healed   Vulva:  not evaluated  Vagina: not evaluated  norrnal labs at [facility] 10 days ago reviewed Assessment:     normal postpartum exam. Pap smear not done at today's visit.   Plan:    1. Contraception: tubal ligation 2. F/u PRN at [facility] 3. Follow up as needed.    Woodroe Mode, MD 05/25/2015

## 2015-06-15 ENCOUNTER — Ambulatory Visit: Payer: Medicaid Other | Admitting: Family Medicine

## 2015-10-20 ENCOUNTER — Ambulatory Visit (INDEPENDENT_AMBULATORY_CARE_PROVIDER_SITE_OTHER): Payer: Medicaid Other | Admitting: Internal Medicine

## 2015-10-20 ENCOUNTER — Encounter: Payer: Self-pay | Admitting: Internal Medicine

## 2015-10-20 ENCOUNTER — Other Ambulatory Visit (HOSPITAL_COMMUNITY)
Admission: RE | Admit: 2015-10-20 | Discharge: 2015-10-20 | Disposition: A | Discharge status: Home / Self Care | Payer: Medicaid Other | Source: Ambulatory Visit | Attending: Family Medicine | Admitting: Family Medicine

## 2015-10-20 VITALS — BP 120/80 | HR 79 | Temp 98.2°F | Ht 67.0 in | Wt 247.0 lb

## 2015-10-20 DIAGNOSIS — Z113 Encounter for screening for infections with a predominantly sexual mode of transmission: Secondary | ICD-10-CM | POA: Insufficient documentation

## 2015-10-20 DIAGNOSIS — A499 Bacterial infection, unspecified: Secondary | ICD-10-CM | POA: Diagnosis not present

## 2015-10-20 DIAGNOSIS — B9689 Other specified bacterial agents as the cause of diseases classified elsewhere: Secondary | ICD-10-CM

## 2015-10-20 DIAGNOSIS — N920 Excessive and frequent menstruation with regular cycle: Secondary | ICD-10-CM

## 2015-10-20 DIAGNOSIS — Z202 Contact with and (suspected) exposure to infections with a predominantly sexual mode of transmission: Secondary | ICD-10-CM

## 2015-10-20 DIAGNOSIS — N760 Acute vaginitis: Secondary | ICD-10-CM

## 2015-10-20 DIAGNOSIS — R300 Dysuria: Secondary | ICD-10-CM

## 2015-10-20 LAB — POCT URINALYSIS DIPSTICK
Bilirubin, UA: NEGATIVE
Glucose, UA: NEGATIVE
Leukocytes, UA: NEGATIVE
NITRITE UA: NEGATIVE
PROTEIN UA: NEGATIVE
RBC UA: NEGATIVE
SPEC GRAV UA: 1.025
UROBILINOGEN UA: 0.2
pH, UA: 6.5

## 2015-10-20 LAB — POCT WET PREP (WET MOUNT)
Clue Cells Wet Prep Whiff POC: POSITIVE
Trichomonas Wet Prep HPF POC: ABSENT

## 2015-10-20 MED ORDER — METRONIDAZOLE 500 MG PO TABS: 500.0000 mg | ORAL_TABLET | Freq: Two times a day (BID) | ORAL | 0 refills | Status: DC

## 2015-10-20 NOTE — Patient Instructions (Addendum)
Ms. Jeanty,  I will call you with the results of the rest of your testing when it is available.  I have prescribed metronidazole for bacterial vaginosis.  If you have heavy bleeding this next period, you may want to try ibuprofen 600 mg once daily with first day of menstrual bleeding for 4 or 5 days or until menstrual bleeding stops.    Hepatitis C  Hepatitis C is transmitted or spread when the blood from a Hepatitis C-infected person enters the bloodstream of someone who is not infected. Today, most people become infected with HCV by sharing needles or other equipment to inject drugs. Before 1992, when screening donated blood and organs for Hepatitis C was not standard in the [place], the disease was commonly spread through blood transfusions and organ transplants.  Hepatitis C can be transmitted through sex between a man and a woman, but the risk is low. Therefore, condoms are not routinely recommended for monogamous, heterosexual couples. The risk of Hepatitis C transmission is higher with unprotected anal sex between two men; using condoms will decrease this risk. All people with multiple sex partners should use condoms to reduce the risk of getting Hepatitis C and/or HIV.  Hepatitis C may be spread if there is a breakdown in the skin or lining of the mouth.  Therefore, sharing of toothbrushes, razor blades and nail clippers is not recommended.  IS HEP C CONTAGIOUS?  Hepatitis C transmission happens only through exposure to an infected person's blood. It is not contagious like the common cold. You cannot get, or give, Hepatitis C by: Kissing Hugging Holding hands Casual contact Sneezing Coughing Sharing eating utensils Sharing food or drink Breastfeeding (unless nipples are cracked and bleeding)

## 2015-10-20 NOTE — Progress Notes (Signed)
[facility] Progress Note  Subjective:  Mackenzie Gibson is a 30-y/o female who presents with concern for STD exposure.  STD exposure: - Fiance cheated on patient with partner who had gonorrhea - Does not use condoms - Also requesting Hep C testing because pt's mother has Hep C and she spends a lot of time at her house but denies sharing of razors or toothbrushes - Has noted increased vaginal discharge off and on and a bad odor for the last 2 weeks - Uses a special pH body wash but does not douche - Endorses lower abdominal pain and some dysuria ROS: No fevers or chills; increased frequency of headaches; no urinary frequency or urgency  Heavy Period: - Had a few days of heavy bleeding with last menstrual cycle and was needed to change pad every couple of hours; has not happened to her before; slowed down by end of cycle - Cycle was not longer than normal - Had BTL after last pregnancy in December ROS: No dizziness, no increased fatigue  Social: Current smoker  Objective: Height 5\' 7"  (1.702 m), weight 247 lb (112 kg), last menstrual period 08/28/2015, currently breastfeeding. Constitutional: Obese, well-appearing female, in NAD Abdominal: Soft. +BS, mild TTP over suprapubic region, ND, no rebound or guarding.  GU: Moderate amount of thin white discharge seen on speculum exam. No cervical motion tenderness on exam. Vitals reviewed  Assessment/Plan: Exposure to STD - Will do full screening with RPR, HIV, Hep C (patient request), wet prep and gc/chlamydia probe - Wet prep positive for BV; prescribed metronidazole - UA negative for nitrites and leukocytes - Gave patient handout on Hep C transmission  Heavy menstrual bleeding - Has only occurred once - Counseled to take 600 mg ibuprofen daily with onset of cycle if happens again  Follow-up as needed.  Olene Floss, MD [place], PGY-2

## 2015-10-21 DIAGNOSIS — N920 Excessive and frequent menstruation with regular cycle: Secondary | ICD-10-CM | POA: Insufficient documentation

## 2015-10-21 LAB — RPR

## 2015-10-21 LAB — HEPATITIS C ANTIBODY: HCV Ab: NEGATIVE

## 2015-10-21 LAB — HIV ANTIBODY (ROUTINE TESTING W REFLEX): HIV: NONREACTIVE

## 2015-10-21 NOTE — Assessment & Plan Note (Signed)
-   Has only occurred once - Counseled to take 600 mg ibuprofen daily with onset of cycle if happens again

## 2015-10-21 NOTE — Assessment & Plan Note (Addendum)
-   Will do full screening with RPR, HIV, Hep C (patient request), wet prep and gc/chlamydia probe - Wet prep positive for BV; prescribed metronidazole - UA negative for nitrites and leukocytes - Gave patient handout on Hep C transmission

## 2015-10-23 LAB — CERVICOVAGINAL ANCILLARY ONLY
Chlamydia: NEGATIVE
Neisseria Gonorrhea: NEGATIVE

## 2016-07-07 IMAGING — US US MFM FETAL BPP W/O NON-STRESS
1 series · 14 of 28 positions shown · non-contrast
Comparison: none

[Series 1: us mfm fetal bpp w/o non-stress · 32 acquisitions, 14 frames shown]
[im 2/32]
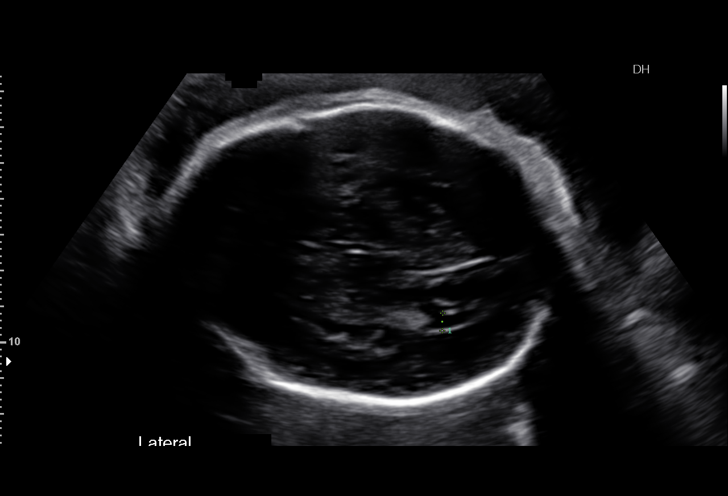
[im 4/32]
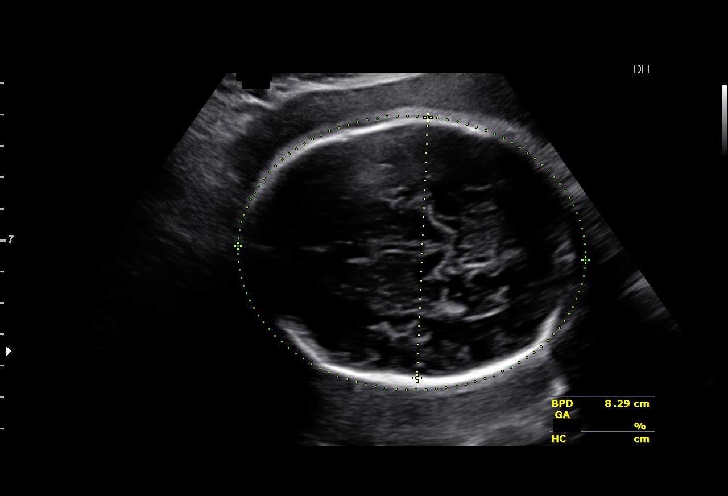
[im 6/32]
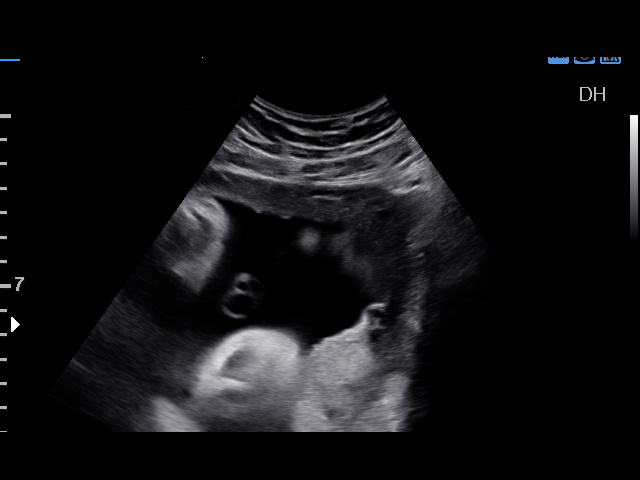
[im 9/32]
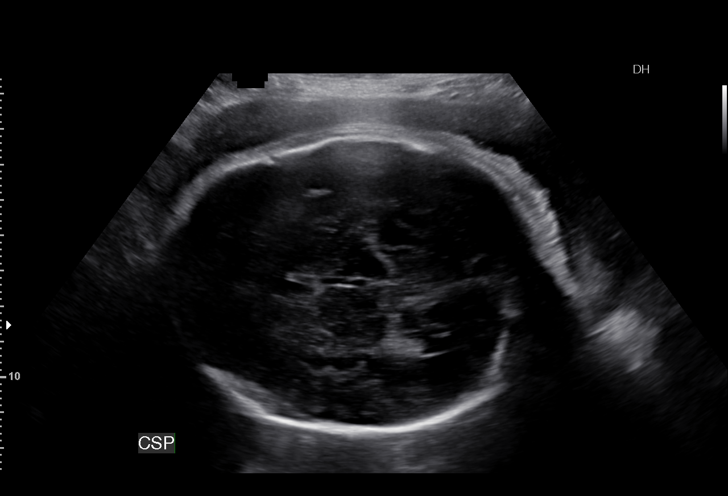
[im 11/32]
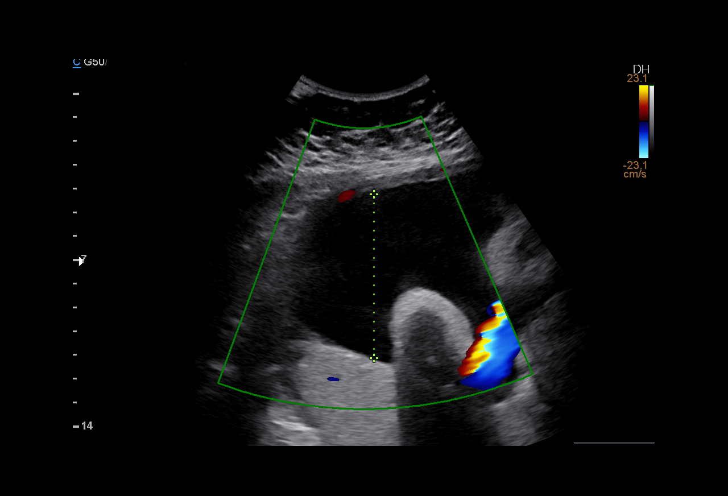
[im 13/32]
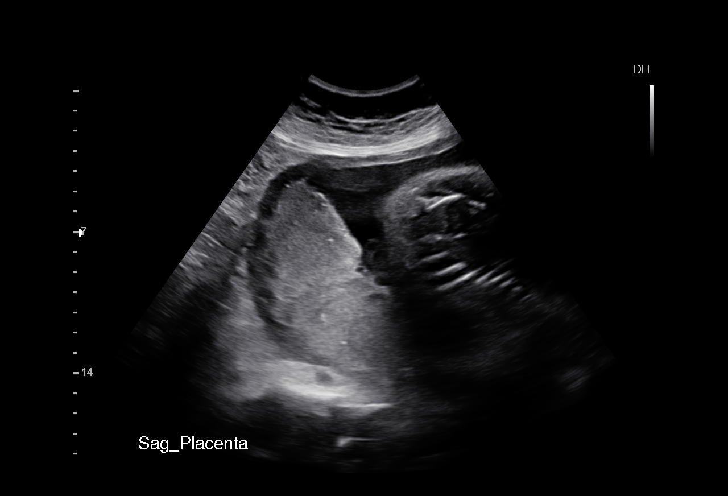
[im 15/32]
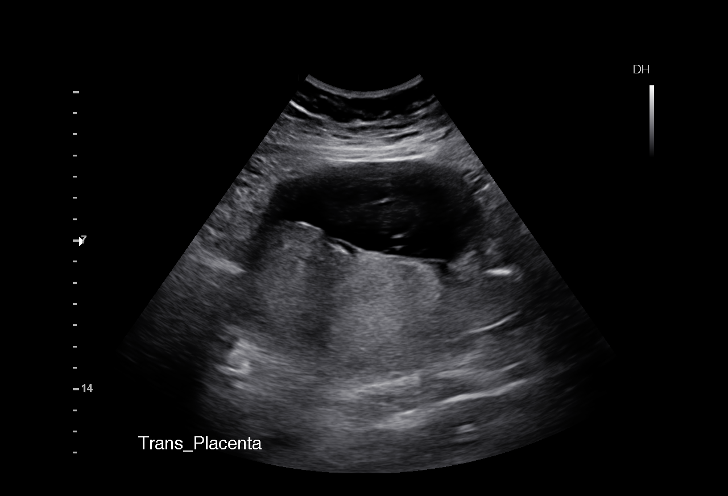
[im 18/32]
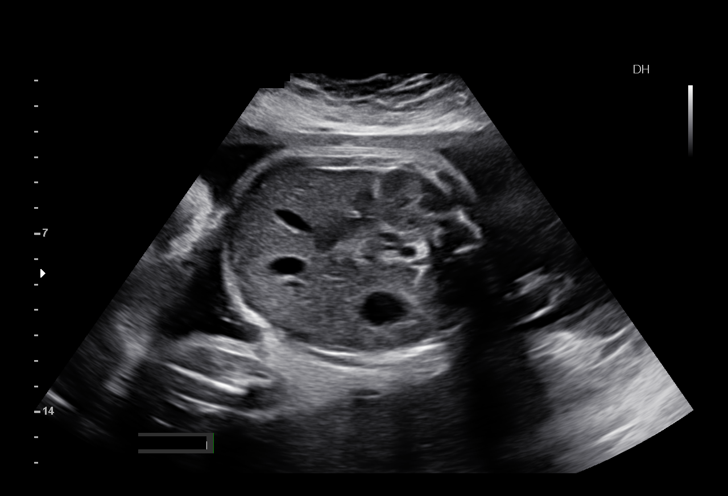
[im 20/32]
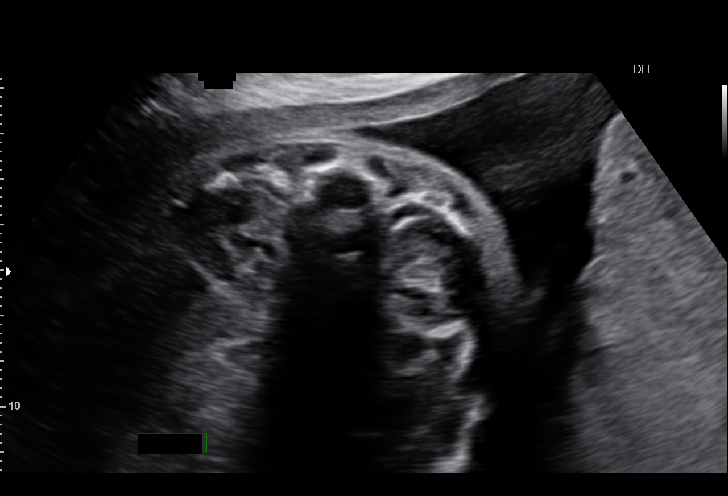
[im 22/32]
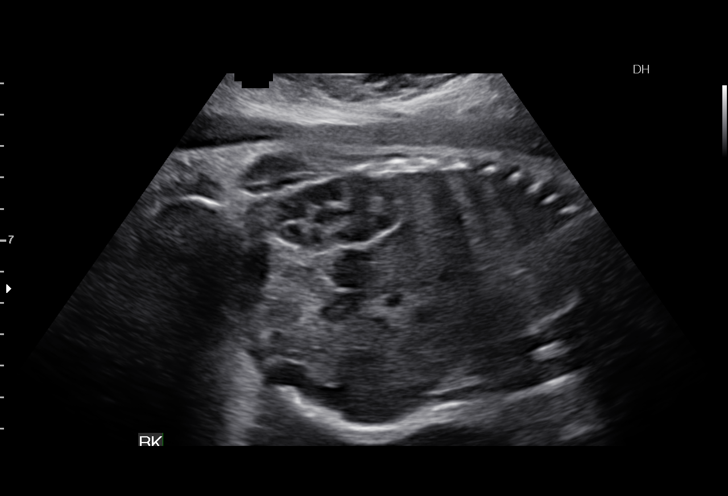
[im 25/32]
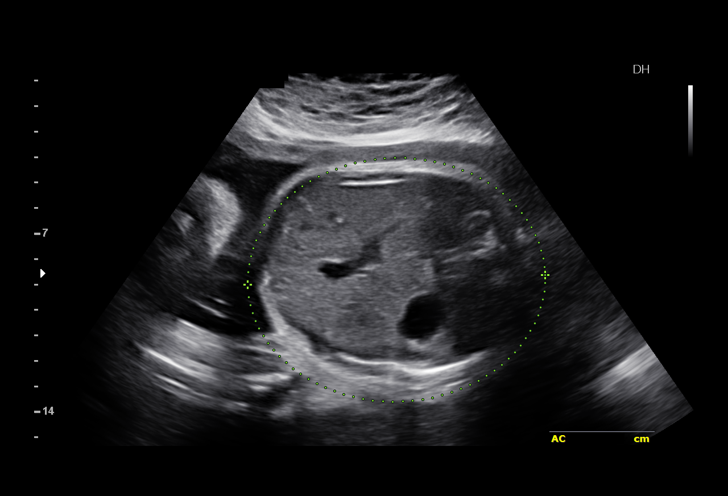
[im 27/32]
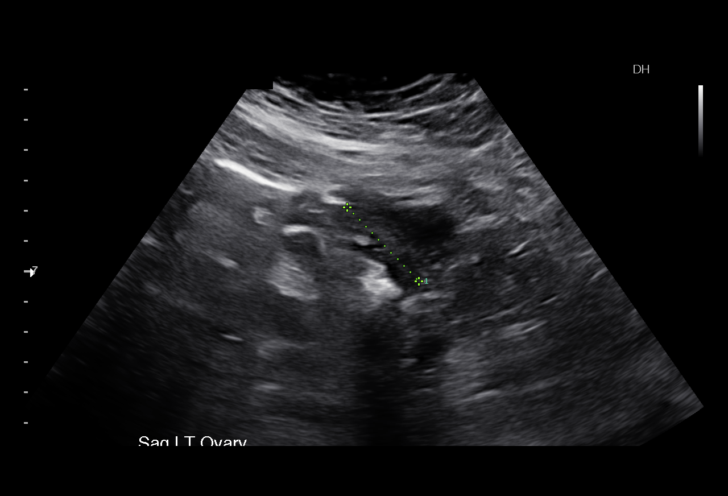
[im 29/32]
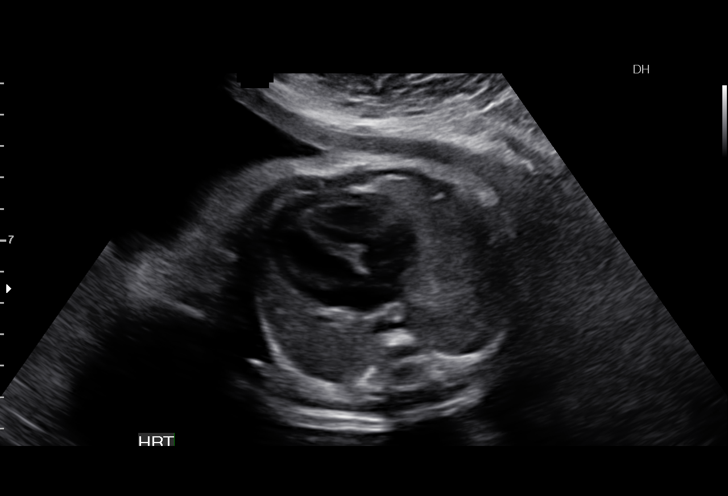
[im 32/32]
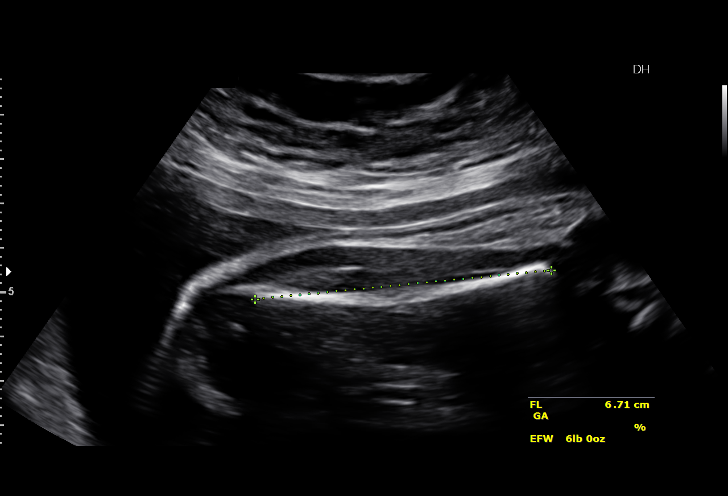

[14 of 28 positions shown; findings below may reference images not displayed]

OBSTETRICS REPORT
(Signed Final 01/27/2015 [DATE])

Date:

Service(s) Provided

Indications

34 weeks gestation of pregnancy
Hypertension - Chronic/Pre-existing (no meds)
Obesity complicating pregnancy, third trimester
Fetal Evaluation

Num Of             1
Fetuses:
Fetal Heart        127                          bpm
Rate:
Cardiac Activity:  Observed
Presentation:      Cephalic
Placenta:          Posterior, above cervical
os
P. Cord            Previously Visualized
Insertion:

Amniotic Fluid
AFI FV:      Subjectively within normal limits
AFI Sum:     22.98    cm      87  %Tile     Larg Pckt:    6.89   cm
RUQ:   6.25    cm    RLQ:   6.89    cm   LUQ:    3.98    cm   LLQ:    5.86   cm
Biophysical Evaluation

Amniotic F.V:   Within normal limits        F. Tone:        Observed
F. Movement:    Observed                    Score:          [DATE]
F. Breathing:   Observed
Biometry

BPD:     82.4   m    G. Age:   33w 1d                 CI:        71.86   70 - 86
m
FL/HC:      21.8   19.4 -
21.8
HC:     309.4   m    G. Age:   34w 4d        20  %    HC/AC:      0.93   0.96 -
m
AC:     331.8   m    G. Age:   37w 1d      > 97  %    FL/BPD      81.7   71 - 87
m                                     :
FL:      67.3   m    G. Age:   34w 4d        46  %    FL/AC:      20.3   20 - 24
m

Est.        2351   gm    6 lb 1 oz      81   %
FW:
Gestational Age

LMP:           35w 3d        Date:  05/24/14                  EDD:   02/28/15
U/S Today:     34w 6d                                         EDD:   03/04/15
Best:          34w 3d    Det. By:   Early Ultrasound          EDD:   03/07/15
(07/11/14)
Anatomy

Cranium:          Appears normal         Aortic Arch:       Previously seen
Fetal Cavum:      Appears normal         Ductal Arch:       Previously seen
Ventricles:       Appears normal         Diaphragm:         Previously seen
Choroid Plexus:   Previously seen        Stomach:           Appears normal,
left sided
Cerebellum:       Previously seen        Abdomen:           Appears normal
Posterior         Previously seen        Abdominal          Previously seen
Fossa:                                   Wall:
Nuchal Fold:      Previously seen        Cord Vessels:      Previously seen
Face:             Orbits and profile     Kidneys:           Appear normal
previously seen
Lips:             Previously seen        Bladder:           Appears normal
Palate:           Previously seen        Spine:             Previously seen
Heart:            Appears normal         Lower              Previously seen
(4CH, axis, and        Extremities:
situs)
RVOT:             Previously seen        Upper              Previously seen
Extremities:
LVOT:             Previously seen

Other:   Female gender previously seen. Heels and LT 5th digit previously
seen. Technically difficult due to maternal habitus and fetal position.
Cervix Uterus Adnexa

Cervix:       Not visualized (advanced GA >83wks)

Left Ovary:    Within normal limits.
Right Ovary:   Not visualized.

Adnexa:     No abnormality visualized.
Impression

Single IUP at 34w 3d
CHTN on no medications
The estimated fetal weight is at the 81st %tile
BPP [DATE]
Normal amniotic fluid volume
Recommendations

Continue antenatal testing as scheduled due to CHTN.
Serial ultrasounds for growth every 4 weeks.

## 2016-08-04 IMAGING — US US MFM OB FOLLOW-UP
1 series · 12 of 18 positions shown · non-contrast
Comparison: none

[Series 1: us mfm ob follow-up · 0.29mm/px · 12 of 18 slices shown]
[im 1/18]
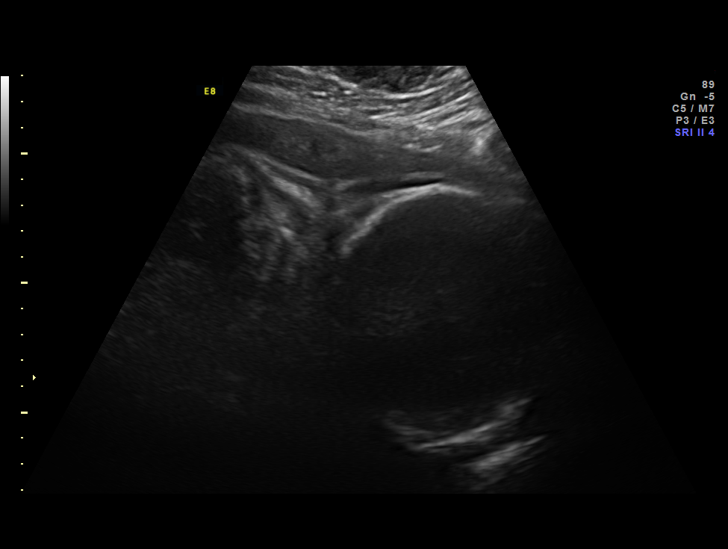
[im 3/18]
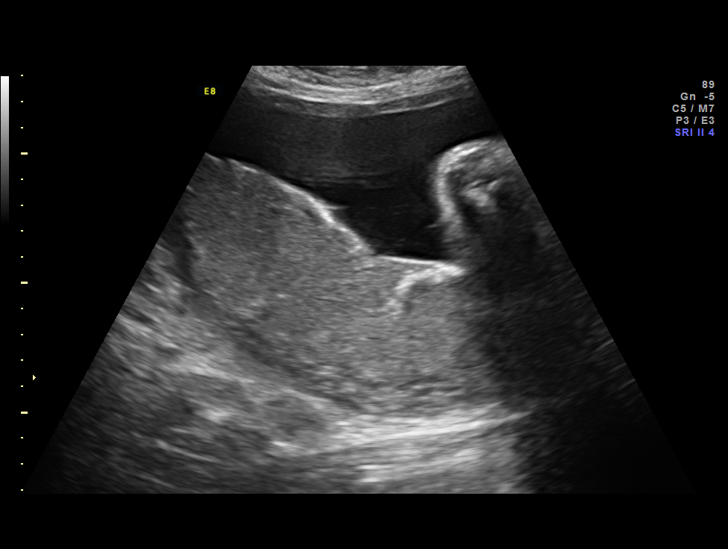
[im 4/18]
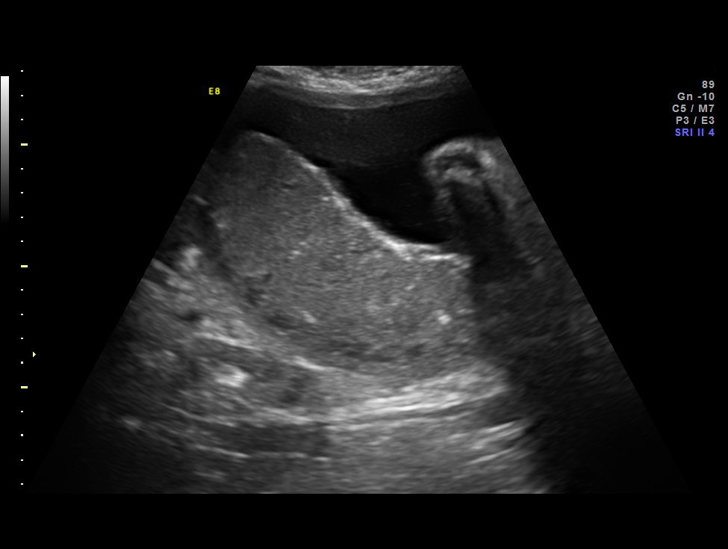
[im 6/18]
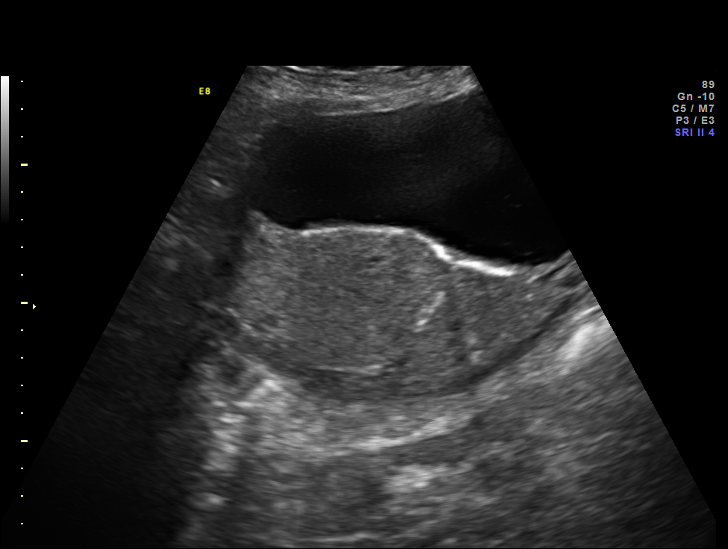
[im 7/18]
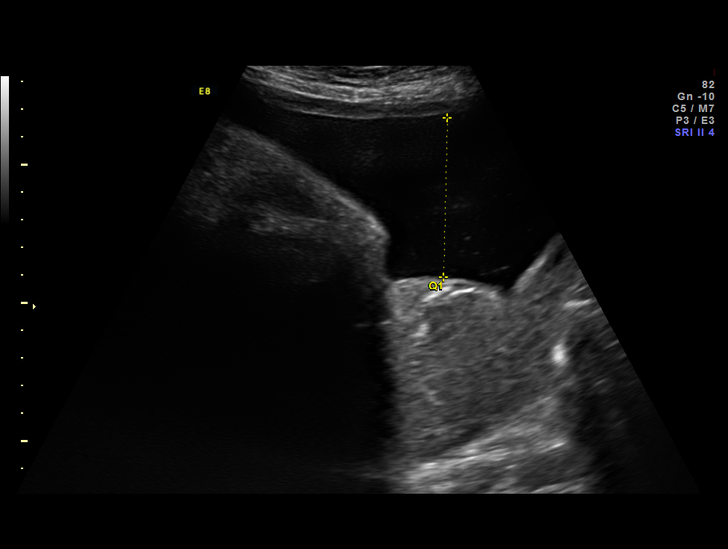
[im 9/18]
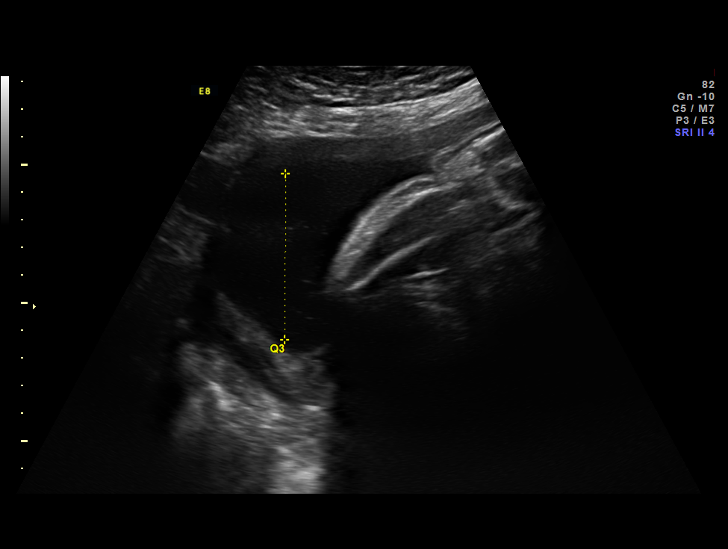
[im 10/18]
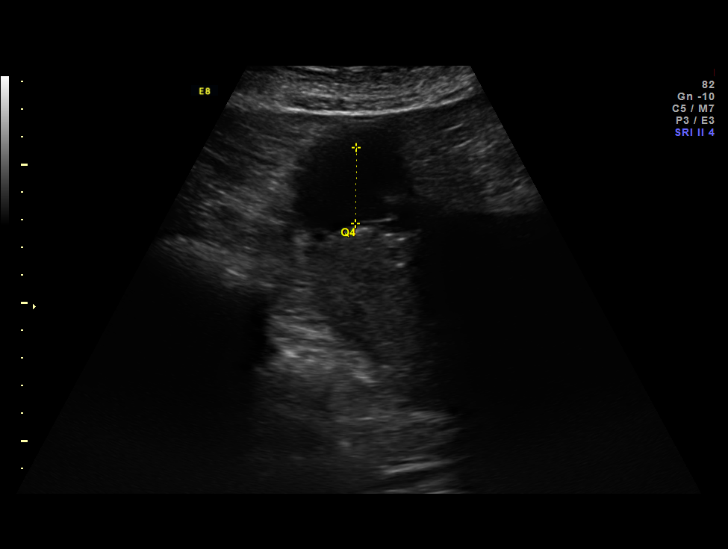
[im 12/18]
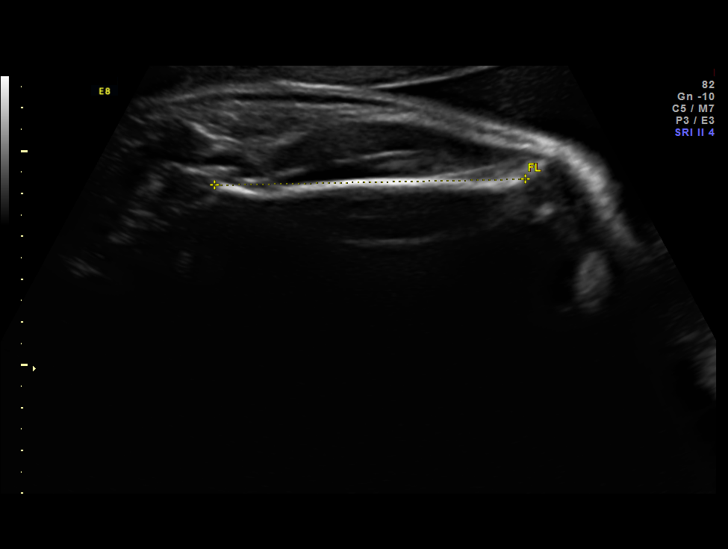
[im 13/18]
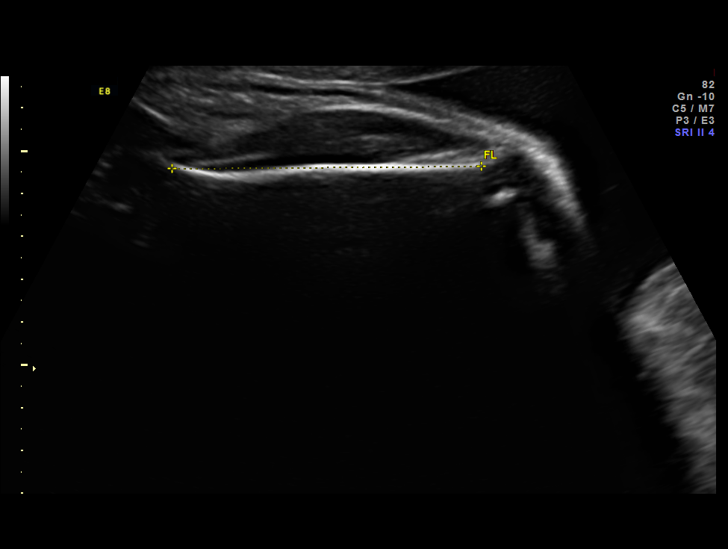
[im 15/18]
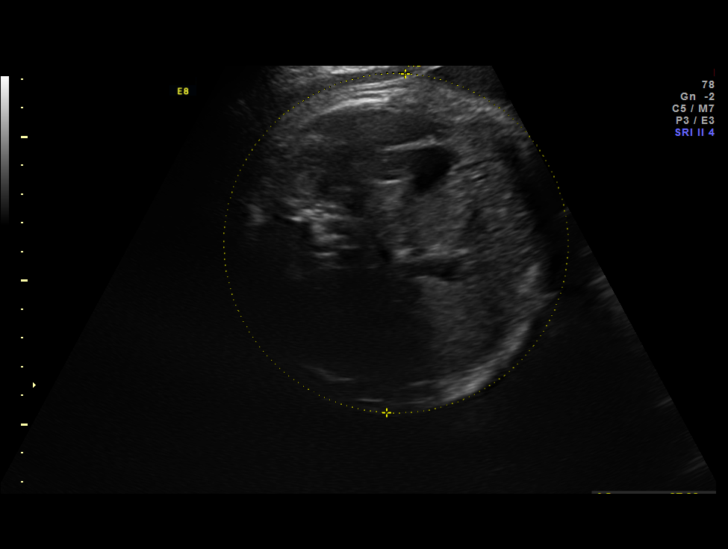
[im 16/18]
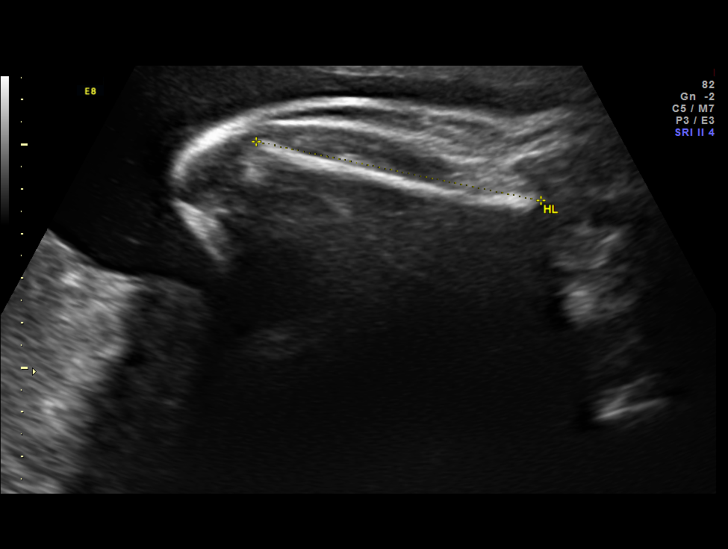
[im 18/18]
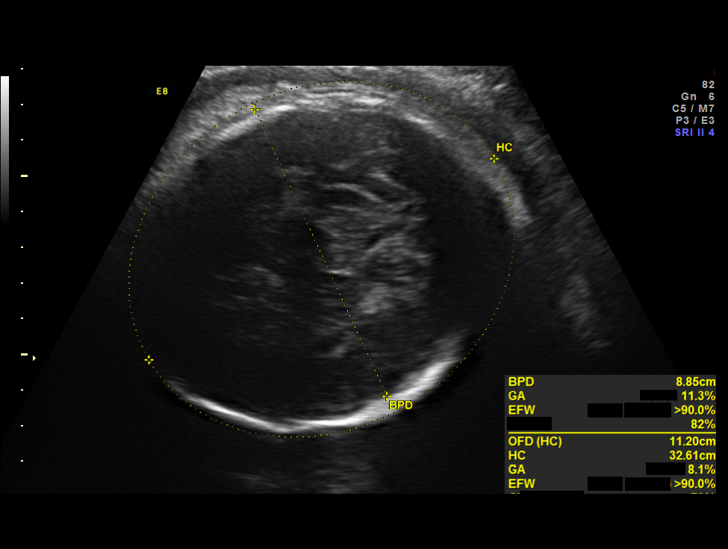

[12 of 18 positions shown; findings below may reference images not displayed]

pm)

Date:

Faculty Physician
Clinic
[REDACTED]

1  DOS SANTOS PEREIRA MARINHA            [id number]       [id number]     [id number]
Indications

Hypertension - Chronic/Pre-existing (no
meds)
Obesity complicating pregnancy, third
trimester
38 weeks gestation of pregnancy
OB History

Gravidity:     5         Term:  3        Prem:    0        SAB:   1
TOP:           0       Ectopic  0        Living:  3
:
Fetal Evaluation

Num Of Fetuses:      1
Fetal Heart          139
Rate(bpm):
Cardiac Activity:    Observed
Presentation:        Cephalic
Placenta:            Posterior, above cervical os
P. Cord Insertion:   Previously Visualized

Amniotic Fluid
AFI FV:      Subjectively within normal limits
AFI Sum:     16.66    cm      66  %Tile     Larg Pckt:    6.01   cm
RUQ:   5.77    cm    RLQ:   2.75    cm   LUQ:    2.13    cm   LLQ:    6.01   cm
Biometry

BPD:        89  mm     G. Age:   36w 0d                  CI:         78.9   %    70 - 86
OFD:     112.8  mm                                       FL/HC:      22.3   %    20.9 -
HC:        325  mm     G. Age:   36w 6d          7  %    HC/AC:      0.85        0.92 -
AC:        381  mm     G. Age:   N/A         > 97   %    FL/BPD      81.5   %    71 - 87
:
FL:       72.5  mm     G. Age:   37w 1d        23   %    FL/AC:      19.0   %    20 - 24
HUM:      65.2  mm     G. Age:   37w 6d        65   %

Est.        2640   gm    8 lb 8 oz    > 90   %
FW:
Gestational Age

LMP:           39w 3d        Date:  05/24/14                  EDD:   02/28/15
U/S Today:     36w 5d                                         EDD:   03/19/15
Best:          38w 3d    Det. By:   Early Ultrasound          EDD:   03/07/15
(07/11/14)
Anatomy

Cranium:          Previously seen        Aortic Arch:       Previously seen
Fetal Cavum:      Previously seen        Ductal Arch:       Previously seen
Ventricles:       Previously seen        Diaphragm:         Appears normal
Choroid Plexus:   Previously seen        Stomach:           Appears normal,
left sided
Cerebellum:       Previously seen        Abdomen:           Previously seen
Posterior         Previously seen        Abdominal          Previously seen
Fossa:                                   Wall:
Nuchal Fold:      Previously seen        Cord Vessels:      Previously seen
Face:             Orbits and profile     Kidneys:           Appear normal
previously seen
Lips:             Previously seen        Bladder:           Appears normal
Palate:           Previously seen        Spine:             Previously seen
Heart:            Previously seen        Upper              Previously seen
Extremities:
RVOT:             Previously seen        Lower              Previously seen
Extremities:
LVOT:             Previously seen

Other:   Female gender previously seen. Heels and LT 5th digit previously
seen. Technically difficult due to maternal habitus and fetal position.
Cervix Uterus Adnexa

Cervix
Not visualized (advanced GA >26wks)
Impression

Single IUP at 38w 3d
CHTN not currently on medications
The estimated fetal weight is > 90th %tile (2640 g).  The
AC measures > 97th %tile..
Normal amniotic fluid volume (AFI 16 cm)
Recommendations

Continue antenatal testing as scheduled.
Recommend delivery at 39 weeks due to CHTN in the
absence of other complications.

## 2016-10-29 ENCOUNTER — Telehealth: Payer: Self-pay | Admitting: *Deleted

## 2016-10-29 ENCOUNTER — Ambulatory Visit: Payer: Medicaid Other

## 2016-11-21 NOTE — Progress Notes (Signed)
   [facility] Phone: [phone]   Date of Visit: 11/22/2016   HPI:  Patient presents today for a well woman exam.   Concerns today: vaginal discharge - reports she has a new partner and has not been using condoms - reports of yellow/white vaginal discharge - no dysuria or increased urinary frequency - no vaginal odor - has chronic low back pain - no fevers or chills  - has tubal ligation. Her LMP was Sept 1 st.  - would like to be tested for STI  Right Wrist Pain:  - this is a chronic issue which seems to get worse over time - no swelling  - no trauma - she works at a [employer] place - no numbness - has tried a wrist splint  - reports her bone seems to be sticking out more on this wrist compared to her left wrist  - wonders if this is RA as her mother has a history of rheumatoid arthritis.   Periods: reguluar Contraception: tubal Pelvic symptoms:  Sexual activity: note above STD Screening: note above Pap smear status: up to date Exercise: walks regularly Diet: she is making improvements on this. She has cut out all beverages except for water. She stays away from fried foods. Eating vegetables daily  Smoking: almost 1 ppd/ 1 year. Since 31yo. Not interested in quitting currently.  Alcohol: no  Drugs: no Mood: reports her mood is okay. She has a few personal things at home that is causing stress. Child has chronic issue. Therapy (cholidescope which helps). Declined meds. No SI/HI Dentist: last seen 6 years. Reports she will go.   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 Mild tenderness to palpation of the distal ulnar. Normal ROM. Tinnel sign negative. phallen test does produce some discomfort/numbness.  GU: normal appearing external genitalia without lesions. Vagina is moist with white discharge. Cervix normal in appearance. No cervical motion tenderness or tenderness on bimanual exam. No adnexal masses.   ASSESSMENT/PLAN:  # Health maintenance:  -STD screening: HIV, RPR, Hepatitis panel, Gc/chlamydia -pap smear: uptodate -mammogram: n/a -lipid screening: placed as future order (has history of PCOS, obesity) -immunizations: needs flu vaccine (office does not have today)  Wrist Pain:  Unclear in etiology. Possibly carpal tunnel but story does not fit perfectly. No trauma history. Less likely RA related.  - xray for further evaluation  - if normal, consider RA work up.   Vaginal Discharge: wet prep with few clue cells. Therefore will treat with Flagyl 500mg  BID x 7days. STI testing as above.   Smiley Houseman, MD PGY [place]

## 2016-11-22 ENCOUNTER — Encounter: Payer: Self-pay | Admitting: Internal Medicine

## 2016-11-22 ENCOUNTER — Ambulatory Visit (INDEPENDENT_AMBULATORY_CARE_PROVIDER_SITE_OTHER): Payer: Medicaid Other | Admitting: Internal Medicine

## 2016-11-22 ENCOUNTER — Other Ambulatory Visit (HOSPITAL_COMMUNITY)
Admission: RE | Admit: 2016-11-22 | Discharge: 2016-11-22 | Disposition: A | Discharge status: Home / Self Care | Payer: Medicaid Other | Source: Ambulatory Visit | Attending: Family Medicine | Admitting: Family Medicine

## 2016-11-22 VITALS — BP 120/80 | HR 85 | Temp 98.8°F | Wt 267.0 lb

## 2016-11-22 DIAGNOSIS — Z7251 High risk heterosexual behavior: Secondary | ICD-10-CM

## 2016-11-22 DIAGNOSIS — E669 Obesity, unspecified: Secondary | ICD-10-CM

## 2016-11-22 DIAGNOSIS — B9689 Other specified bacterial agents as the cause of diseases classified elsewhere: Secondary | ICD-10-CM

## 2016-11-22 DIAGNOSIS — N760 Acute vaginitis: Secondary | ICD-10-CM

## 2016-11-22 DIAGNOSIS — Z0000 Encounter for general adult medical examination without abnormal findings: Secondary | ICD-10-CM

## 2016-11-22 DIAGNOSIS — M25531 Pain in right wrist: Secondary | ICD-10-CM | POA: Diagnosis not present

## 2016-11-22 DIAGNOSIS — G8929 Other chronic pain: Secondary | ICD-10-CM | POA: Diagnosis not present

## 2016-11-22 DIAGNOSIS — E282 Polycystic ovarian syndrome: Secondary | ICD-10-CM

## 2016-11-22 LAB — POCT WET PREP (WET MOUNT)
Clue Cells Wet Prep Whiff POC: NEGATIVE
Trichomonas Wet Prep HPF POC: ABSENT

## 2016-11-22 LAB — POCT GLYCOSYLATED HEMOGLOBIN (HGB A1C): Hemoglobin A1C: 4.8

## 2016-11-22 NOTE — Patient Instructions (Signed)
Thank you for coming.   I will call you with the lab results  Let's get an x-ray of the wrist to further evaluate your pain.  In the mean time try a wrist splint at night and when you are working.   Make a lab visit and come in fasting for your cholesterol test.

## 2016-11-23 LAB — HEPATITIS PANEL, ACUTE
HEP A IGM: NEGATIVE
HEP B C IGM: NEGATIVE
HEP B S AG: NEGATIVE

## 2016-11-23 LAB — RPR: RPR: NONREACTIVE

## 2016-11-23 LAB — HIV ANTIBODY (ROUTINE TESTING W REFLEX): HIV SCREEN 4TH GENERATION: NONREACTIVE

## 2016-11-25 ENCOUNTER — Telehealth: Payer: Self-pay | Admitting: *Deleted

## 2016-11-25 LAB — CERVICOVAGINAL ANCILLARY ONLY
Chlamydia: NEGATIVE
Neisseria Gonorrhea: NEGATIVE

## 2016-11-25 MED ORDER — METRONIDAZOLE 500 MG PO TABS: 500.0000 mg | ORAL_TABLET | Freq: Two times a day (BID) | ORAL | 0 refills | Status: DC

## 2016-11-25 NOTE — Telephone Encounter (Signed)
Patient called to request results from last office visit.  Derl Barrow, RN

## 2016-11-26 ENCOUNTER — Telehealth: Payer: Self-pay

## 2016-11-26 NOTE — Telephone Encounter (Signed)
Pt contacted and informed of negative std results and normal A1C. Pt informed of positive BV and rx to her pharmacy. Pt had no questions regarding the medication.

## 2016-11-26 NOTE — Telephone Encounter (Signed)
-----   Message from Smiley Houseman, MD sent at 11/26/2016 10:42 AM EDT ----- Doristine Devoid thanks, could you please let her know of the results and about the Flagyl prescription for BV ----- Message ----- From: Dorna Bloom, CMA Sent: 11/26/2016  10:23 AM To: Smiley Houseman, MD  They came back today, negative.  ----- Message ----- From: Smiley Houseman, MD Sent: 11/25/2016   2:09 PM To: Dorna Bloom, CMA  Hi Page,   It looks like the Gc/Chlamydia test was not processed? Could you look into this?   Additionally, could you please call the patient to inform her that it looks like she has BV, and I sent Flagyl for her. Her hepatitis panel and syphilis tests were negative. Her Hemoglobin A1c is normal at 4.8. If the above test was not processed, could you please ask her to come in and provide a urine test so we can send a urine Gc/Chlamydia.   Thank you!  Waldon Reining

## 2016-11-28 NOTE — Telephone Encounter (Signed)
Patient already contacted

## 2017-02-07 ENCOUNTER — Ambulatory Visit: Payer: Medicaid Other | Admitting: Family Medicine

## 2017-02-10 ENCOUNTER — Telehealth: Payer: Self-pay | Admitting: Internal Medicine

## 2017-02-10 NOTE — Telephone Encounter (Signed)
[facility] After Hours Telephone Line   Number received via page: [phone] Person calling: [name]  Reason for call: Patient calling regarding abdominal pain.  She states that she was seen several days ago for abdominal pain, lack of menstrual cycle for 12 days, and vaginal discharge.  She had a negative pregnancy test.  Patient has a bilateral tubal ligation.  She was found to have bacterial vaginosis and was given antibiotics for this.  She she is nearing completion of antibiotics but continues to have the pain denies any worsening.  States is all across her lower abdomen.  No nausea, vomiting, fevers.  Indicates significant amount of watery vaginal discharge.  Discussed options with her including going to the ED, being seen tomorrow. States  that if patient continues to have abdominal pain, if it worsens she develops nausea,vomiting loss of appetite or fevers and to go to the ED at  [facility] immediately  Kerrin Mo PGY-3 [place]

## 2017-04-08 ENCOUNTER — Ambulatory Visit (INDEPENDENT_AMBULATORY_CARE_PROVIDER_SITE_OTHER): Payer: Medicaid Other

## 2017-04-08 DIAGNOSIS — Z23 Encounter for immunization: Secondary | ICD-10-CM

## 2017-04-14 NOTE — Progress Notes (Deleted)
   [facility] Phone: [phone]   Date of Visit: 04/15/2017   HPI:  ***  ROS: See HPI.  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 Gen: *** HEENT: *** Heart: *** Lungs: *** Neuro: *** Ext: ***  ASSESSMENT/PLAN:  Health maintenance:  -***  No problem-specific Assessment & Plan notes found for this encounter.  FOLLOW UP: Follow up in *** for ***  Smiley Houseman, MD PGY [place]

## 2017-04-15 ENCOUNTER — Ambulatory Visit: Payer: Medicaid Other | Admitting: Internal Medicine

## 2017-04-20 NOTE — Progress Notes (Signed)
   [facility] Phone: [phone]   Date of Visit: 04/21/2017   HPI:  PCOS: -Reports of weight gain despite being somewhat active.  She reports that she has not been doing well with her diet -She wonders if she needs to be on metformin -She also has issues with acne and oily skin.  Acne is mainly on her chin and face but also on her back and upper extremities.  This has been worsening for the past few months -She also reports of irregular periods her last.  In January she only had spotting.  She skipped her period in September and November.  She had a period in October and had light spotting and December.  She has had a tubal ligation. -She feels more constipated.  She reports of bowel movements every 5-6 days.  No blood in stool. -Reports that she walks around with her kids and plays with her kids at times and that she is always on her feet at work.  Otherwise she does not do any other types of physical activity.  ROS: See HPI.  [place]:  PMH: PCOS, chronic asthmatic bronchitis, tobacco use, bipolar disorder, obesity, eczema   PHYSICAL EXAM: BP 110/72   Pulse 74   Temp 98 F (36.7 C) (Oral)   Wt 280 lb (127 kg)   LMP 03/17/2017   SpO2 99%   BMI 43.85 kg/m  GEN: NAD HEENT: Atraumatic, normocephalic, neck supple, EOMI, sclera clear, open and closed comedones throughout her face as well as inflammatory lesions on her face, on her back and upper extremities bilaterally and some on her chest.  No abscesses. CV: RRR, no murmurs, rubs, or gallops PULM: CTAB, normal effort ABD: Soft, nontender, nondistended, NABS, no organomegaly SKIN: No rash or cyanosis; warm and well-perfused EXTR: No lower extremity edema or calf tenderness PSYCH: Mood and affect euthymic, normal rate and volume of speech NEURO: Awake, alert, no focal deficits grossly, normal speech   ASSESSMENT/PLAN:  PCOS: For her weight gain we discussed the importance of regular physical activity  and monitoring dietary intake.  We also discussed the usefulness of weight loss and managing her other PCOS symptoms.  For her acne we will do BenzaClin gel twice daily to affected areas.  We discussed the need for contraception with progesterone to treat her oligomenorrhea and to decrease the risk of endometrial hyperplasia.  She is not a candidate for combination oral contraceptives due to tobacco use, obesity.  Discussed different options for progestin only contraception.  She has a bilateral tubal ligation therefore the medication is not necessarily for contraception but for treatment of oligomenorrhea. she chooses to take oral medication.  Of note her urine pregnancy test today is negative.  We will also test for prolactin level to evaluate for other causes of oligomenorrhea.  Will repeat A1c today as patient would like to check for insulin resistance.  She is really interested in seeing if she needs to be on metformin.  Will also order TSH to evaluate for his possible secondary causes.  Will call to see whether patient would like to take progesterone for 10-14 days out of the month or a daily progesterone.  Flu vaccine today   Smiley Houseman, MD PGY [place]

## 2017-04-21 ENCOUNTER — Ambulatory Visit: Payer: Medicaid Other | Admitting: Internal Medicine

## 2017-04-21 ENCOUNTER — Other Ambulatory Visit: Payer: Self-pay

## 2017-04-21 ENCOUNTER — Encounter: Payer: Self-pay | Admitting: Internal Medicine

## 2017-04-21 VITALS — BP 110/72 | HR 74 | Temp 98.0°F | Wt 280.0 lb

## 2017-04-21 DIAGNOSIS — R635 Abnormal weight gain: Secondary | ICD-10-CM

## 2017-04-21 DIAGNOSIS — E781 Pure hyperglyceridemia: Secondary | ICD-10-CM | POA: Diagnosis not present

## 2017-04-21 DIAGNOSIS — E782 Mixed hyperlipidemia: Secondary | ICD-10-CM | POA: Diagnosis not present

## 2017-04-21 DIAGNOSIS — R7309 Other abnormal glucose: Secondary | ICD-10-CM | POA: Diagnosis not present

## 2017-04-21 DIAGNOSIS — N914 Secondary oligomenorrhea: Secondary | ICD-10-CM | POA: Diagnosis not present

## 2017-04-21 DIAGNOSIS — E282 Polycystic ovarian syndrome: Secondary | ICD-10-CM

## 2017-04-21 DIAGNOSIS — Z23 Encounter for immunization: Secondary | ICD-10-CM

## 2017-04-21 LAB — POCT URINE PREGNANCY: PREG TEST UR: NEGATIVE

## 2017-04-21 NOTE — Patient Instructions (Addendum)
[email or web address]    We will get some blood work today  I will call you about the results.

## 2017-04-22 ENCOUNTER — Encounter: Payer: Self-pay | Admitting: Internal Medicine

## 2017-04-22 LAB — LIPID PANEL
CHOLESTEROL TOTAL: 110 mg/dL (ref 100–199)
Chol/HDL Ratio: 3.7 ratio (ref 0.0–4.4)
HDL: 30 mg/dL — AB (ref >39)
LDL Calculated: 56 mg/dL (ref 0–99)
TRIGLYCERIDES: 118 mg/dL (ref 0–149)
VLDL CHOLESTEROL CAL: 24 mg/dL (ref 5–40)

## 2017-04-22 LAB — TSH: TSH: 2.44 u[IU]/mL (ref 0.450–4.500)

## 2017-04-22 LAB — HEMOGLOBIN A1C
Est. average glucose Bld gHb Est-mCnc: 88 mg/dL
Hgb A1c MFr Bld: 4.7 % — ABNORMAL LOW (ref 4.8–5.6)

## 2017-04-22 LAB — PROLACTIN: PROLACTIN: 5.7 ng/mL (ref 4.8–23.3)

## 2017-04-22 MED ORDER — CLINDAMYCIN PHOS-BENZOYL PEROX 1-5 % EX GEL: Freq: Two times a day (BID) | CUTANEOUS | 0 refills | Status: DC

## 2017-04-22 NOTE — Assessment & Plan Note (Signed)
She has had ultrasound findings consistent with PCOS in the past.  We have checked her A1c in the recent past has been within normal range.  Patient asked to

## 2017-04-22 NOTE — Assessment & Plan Note (Signed)
>>  ASSESSMENT AND PLAN FOR POLYCYSTIC OVARIAN DISEASE WRITTEN ON 04/22/2017  1:47 PM BY Smiley Houseman, MD  She has had ultrasound findings consistent with PCOS in the past.  We have checked her A1c in the recent past has been within normal range.  Patient asked to

## 2017-04-23 ENCOUNTER — Telehealth: Payer: Self-pay | Admitting: Internal Medicine

## 2017-04-23 MED ORDER — NORETHINDRONE 0.35 MG PO TABS: 1.0000 | ORAL_TABLET | Freq: Every day | ORAL | 11 refills | Status: DC

## 2017-04-23 NOTE — Telephone Encounter (Signed)
Called patient to discuss normal labs. We discussed starting progesterone only pill for endometrial protection in the setting of irregular periods with PCOS. Combined OCPs CI in this patient. Will start micronor daily. Patient to follow up in 3 months.

## 2017-05-17 ENCOUNTER — Emergency Department (HOSPITAL_COMMUNITY): Payer: No Typology Code available for payment source

## 2017-05-17 ENCOUNTER — Emergency Department (HOSPITAL_COMMUNITY)
Admission: EM | Admit: 2017-05-17 | Discharge: 2017-05-18 | Disposition: A | Discharge status: Home / Self Care | Payer: No Typology Code available for payment source | Attending: Emergency Medicine | Admitting: Emergency Medicine

## 2017-05-17 ENCOUNTER — Encounter (HOSPITAL_COMMUNITY): Payer: Self-pay | Admitting: Emergency Medicine

## 2017-05-17 DIAGNOSIS — Z3202 Encounter for pregnancy test, result negative: Secondary | ICD-10-CM | POA: Insufficient documentation

## 2017-05-17 DIAGNOSIS — M25511 Pain in right shoulder: Secondary | ICD-10-CM | POA: Insufficient documentation

## 2017-05-17 DIAGNOSIS — Y92410 Unspecified street and highway as the place of occurrence of the external cause: Secondary | ICD-10-CM | POA: Insufficient documentation

## 2017-05-17 DIAGNOSIS — Z79899 Other long term (current) drug therapy: Secondary | ICD-10-CM | POA: Insufficient documentation

## 2017-05-17 DIAGNOSIS — Y999 Unspecified external cause status: Secondary | ICD-10-CM | POA: Diagnosis not present

## 2017-05-17 DIAGNOSIS — Y9389 Activity, other specified: Secondary | ICD-10-CM | POA: Diagnosis not present

## 2017-05-17 DIAGNOSIS — M542 Cervicalgia: Secondary | ICD-10-CM | POA: Insufficient documentation

## 2017-05-17 DIAGNOSIS — R0789 Other chest pain: Secondary | ICD-10-CM | POA: Diagnosis not present

## 2017-05-17 DIAGNOSIS — F17210 Nicotine dependence, cigarettes, uncomplicated: Secondary | ICD-10-CM | POA: Insufficient documentation

## 2017-05-17 DIAGNOSIS — R51 Headache: Secondary | ICD-10-CM | POA: Insufficient documentation

## 2017-05-17 LAB — POC URINE PREG, ED: Preg Test, Ur: NEGATIVE

## 2017-05-17 MED ORDER — ONDANSETRON HCL 4 MG/2ML IJ SOLN
4.0000 mg | Freq: Once | INTRAMUSCULAR | Status: AC
  Administered 2017-05-18: 4 mg via INTRAVENOUS
  Filled 2017-05-17: qty 2

## 2017-05-17 MED ORDER — LORAZEPAM 2 MG/ML IJ SOLN: 1.0000 mg | Freq: Once | INTRAMUSCULAR | Status: DC

## 2017-05-17 MED ORDER — ACETAMINOPHEN 500 MG PO TABS
1000.0000 mg | ORAL_TABLET | Freq: Once | ORAL | Status: AC
  Administered 2017-05-18: 1000 mg via ORAL
  Filled 2017-05-17: qty 2
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
## 2017-05-18 ENCOUNTER — Emergency Department (HOSPITAL_COMMUNITY): Payer: No Typology Code available for payment source

## 2017-05-18 LAB — I-STAT CHEM 8, ED
BUN: 11 mg/dL (ref 6–20)
CHLORIDE: 105 mmol/L (ref 101–111)
Calcium, Ion: 1.17 mmol/L (ref 1.15–1.40)
Creatinine, Ser: 0.7 mg/dL (ref 0.44–1.00)
Glucose, Bld: 126 mg/dL — ABNORMAL HIGH (ref 65–99)
HEMATOCRIT: 42 % (ref 36.0–46.0)
Hemoglobin: 14.3 g/dL (ref 12.0–15.0)
POTASSIUM: 4.1 mmol/L (ref 3.5–5.1)
SODIUM: 141 mmol/L (ref 135–145)
TCO2: 22 mmol/L (ref 22–32)

## 2017-05-18 MED ORDER — IBUPROFEN 800 MG PO TABS: 800.0000 mg | ORAL_TABLET | Freq: Three times a day (TID) | ORAL | 0 refills | Status: DC

## 2017-05-18 MED ORDER — IOPAMIDOL (ISOVUE-370) INJECTION 76%
INTRAVENOUS | Status: AC
  Administered 2017-05-18: 100 mL
  Filled 2017-05-18: qty 100

## 2017-05-18 MED ORDER — CYCLOBENZAPRINE HCL 10 MG PO TABS: 10.0000 mg | ORAL_TABLET | Freq: Two times a day (BID) | ORAL | 0 refills | Status: DC | PRN

## 2017-05-18 NOTE — ED Notes (Signed)
Patient transported to CT 
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
## 2017-05-19 ENCOUNTER — Encounter: Payer: Self-pay | Admitting: Family Medicine

## 2017-05-19 ENCOUNTER — Ambulatory Visit (INDEPENDENT_AMBULATORY_CARE_PROVIDER_SITE_OTHER): Payer: Self-pay | Admitting: Family Medicine

## 2017-05-19 ENCOUNTER — Ambulatory Visit (HOSPITAL_COMMUNITY)
Admission: RE | Admit: 2017-05-19 | Discharge: 2017-05-19 | Disposition: A | Discharge status: Home / Self Care | Payer: No Typology Code available for payment source | Source: Ambulatory Visit | Attending: Family Medicine | Admitting: Family Medicine

## 2017-05-19 VITALS — BP 130/80 | HR 81 | Temp 98.1°F | Wt 283.8 lb

## 2017-05-19 DIAGNOSIS — X58XXXD Exposure to other specified factors, subsequent encounter: Secondary | ICD-10-CM | POA: Diagnosis not present

## 2017-05-19 DIAGNOSIS — S3991XD Unspecified injury of abdomen, subsequent encounter: Secondary | ICD-10-CM | POA: Insufficient documentation

## 2017-05-19 DIAGNOSIS — S298XXD Other specified injuries of thorax, subsequent encounter: Secondary | ICD-10-CM

## 2017-05-19 DIAGNOSIS — S3991XA Unspecified injury of abdomen, initial encounter: Secondary | ICD-10-CM | POA: Insufficient documentation

## 2017-05-19 DIAGNOSIS — S298XXA Other specified injuries of thorax, initial encounter: Secondary | ICD-10-CM | POA: Insufficient documentation

## 2017-05-19 LAB — POCT UA - MICROSCOPIC ONLY

## 2017-05-19 LAB — POCT URINALYSIS DIP (MANUAL ENTRY)
BILIRUBIN UA: NEGATIVE
GLUCOSE UA: NEGATIVE mg/dL
Ketones, POC UA: NEGATIVE mg/dL
Nitrite, UA: NEGATIVE
Protein Ur, POC: NEGATIVE mg/dL
SPEC GRAV UA: 1.02 (ref 1.010–1.025)
UROBILINOGEN UA: 0.2 U/dL
pH, UA: 7.5 (ref 5.0–8.0)

## 2017-05-19 MED ORDER — OXYCODONE-ACETAMINOPHEN 5-325 MG PO TABS: 1.0000 | ORAL_TABLET | ORAL | 0 refills | Status: DC | PRN

## 2017-05-19 NOTE — Assessment & Plan Note (Signed)
Patient still with a lot of pain in right lower thorax and right breast. Reviewed imaging and from 3/16 hospital visit. No right lower chest fractures. Her pain will likely resolve over time but it is still causing her significant limitation. Gave guidance and strict return precautions. Does not seem like ibuprofen and flexeril is controlling pain very well. Will d/c flexeril and add on percocet 5mg .
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
## 2017-05-20 LAB — COMPREHENSIVE METABOLIC PANEL
ALK PHOS: 56 IU/L (ref 39–117)
ALT: 17 IU/L (ref 0–32)
AST: 15 IU/L (ref 0–40)
Albumin/Globulin Ratio: 1.5 (ref 1.2–2.2)
Albumin: 4.3 g/dL (ref 3.5–5.5)
BUN/Creatinine Ratio: 18 (ref 9–23)
BUN: 13 mg/dL (ref 6–20)
Bilirubin Total: 0.3 mg/dL (ref 0.0–1.2)
CALCIUM: 9 mg/dL (ref 8.7–10.2)
CO2: 23 mmol/L (ref 20–29)
CREATININE: 0.72 mg/dL (ref 0.57–1.00)
Chloride: 104 mmol/L (ref 96–106)
GFR calc Af Amer: 128 mL/min/{1.73_m2} (ref >59)
GFR, EST NON AFRICAN AMERICAN: 111 mL/min/{1.73_m2} (ref >59)
GLOBULIN, TOTAL: 2.9 g/dL (ref 1.5–4.5)
Glucose: 82 mg/dL (ref 65–99)
Potassium: 4.7 mmol/L (ref 3.5–5.2)
SODIUM: 142 mmol/L (ref 134–144)
Total Protein: 7.2 g/dL (ref 6.0–8.5)

## 2017-05-20 LAB — CBC WITH DIFFERENTIAL/PLATELET
BASOS ABS: 0.1 10*3/uL (ref 0.0–0.2)
Basos: 1 %
EOS (ABSOLUTE): 0.4 10*3/uL (ref 0.0–0.4)
EOS: 4 %
HEMATOCRIT: 40.8 % (ref 34.0–46.6)
Hemoglobin: 14 g/dL (ref 11.1–15.9)
IMMATURE GRANULOCYTES: 1 %
Immature Grans (Abs): 0.1 10*3/uL (ref 0.0–0.1)
LYMPHS ABS: 2.1 10*3/uL (ref 0.7–3.1)
Lymphs: 20 %
MCH: 28.4 pg (ref 26.6–33.0)
MCHC: 34.3 g/dL (ref 31.5–35.7)
MCV: 83 fL (ref 79–97)
MONOS ABS: 0.6 10*3/uL (ref 0.1–0.9)
Monocytes: 5 %
NEUTROS PCT: 69 %
Neutrophils Absolute: 7.1 10*3/uL — ABNORMAL HIGH (ref 1.4–7.0)
Platelets: 191 10*3/uL (ref 150–379)
RBC: 4.93 x10E6/uL (ref 3.77–5.28)
RDW: 14.1 % (ref 12.3–15.4)
WBC: 10.2 10*3/uL (ref 3.4–10.8)

## 2017-05-21 ENCOUNTER — Telehealth: Payer: Self-pay | Admitting: Internal Medicine

## 2017-05-21 NOTE — Telephone Encounter (Signed)
Pt came in office and left a Report of Medical Examination form from [employer] requesting to be fill out and sign by MD. Pt was seen on 05-20-17 by Dr.Fletcher. Pt also wants to know when is she able to go back to work, she wants this to be written on the form. Please fax the form to [phone]. Pt wants to be called to be inform when is she able to go back to work. Best phone # to call is [phone]. Form was placed in [facility] team folder.

## 2017-05-21 NOTE — Telephone Encounter (Signed)
Clinical info completed on employement form.  Place form in Dr. Perlie Mayo box for completion.  Mackenzie Gibson, [place]

## 2017-05-22 NOTE — Telephone Encounter (Signed)
Form is completed and placed in nurse box. Please inform patient that she can return to work on 05/26/17.
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
## 2017-05-23 NOTE — Telephone Encounter (Signed)
Will forward to Dr. Fletcher.  Jazmin Hartsell,CMA  

## 2017-05-23 NOTE — Telephone Encounter (Signed)
Forms faxed per Pt request. Left message for patient informing her forms were faxed and she may return to work 05/26/17. Wallace Cullens, RN

## 2017-05-23 NOTE — Telephone Encounter (Signed)
Patient aware that letter is ready for pick up and also that labs are normal.  Eilis Chestnutt,CMA
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
## 2017-05-23 NOTE — Telephone Encounter (Signed)
Pt called back about disability forms and return to work. She is adamant that there is no way she can go back to work on 05/26/17. She states she is unable to lift anything, and upset over return date. Advised patient I would forward information to her MD.  Wallace Cullens, RN
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
## 2017-05-29 ENCOUNTER — Ambulatory Visit (INDEPENDENT_AMBULATORY_CARE_PROVIDER_SITE_OTHER): Payer: Self-pay | Admitting: Internal Medicine

## 2017-05-29 ENCOUNTER — Encounter: Payer: Self-pay | Admitting: Internal Medicine

## 2017-05-29 ENCOUNTER — Other Ambulatory Visit: Payer: Self-pay | Admitting: Internal Medicine

## 2017-05-29 ENCOUNTER — Other Ambulatory Visit: Payer: Self-pay

## 2017-05-29 DIAGNOSIS — R0781 Pleurodynia: Secondary | ICD-10-CM

## 2017-05-29 DIAGNOSIS — N6310 Unspecified lump in the right breast, unspecified quadrant: Secondary | ICD-10-CM

## 2017-05-29 DIAGNOSIS — N6315 Unspecified lump in the right breast, overlapping quadrants: Secondary | ICD-10-CM

## 2017-05-29 DIAGNOSIS — M25511 Pain in right shoulder: Secondary | ICD-10-CM

## 2017-05-29 MED ORDER — MELOXICAM 15 MG PO TABS: 15.0000 mg | ORAL_TABLET | Freq: Every day | ORAL | 0 refills | Status: DC | PRN

## 2017-05-29 MED ORDER — OXYCODONE-ACETAMINOPHEN 5-325 MG PO TABS: 1.0000 | ORAL_TABLET | Freq: Three times a day (TID) | ORAL | 0 refills | Status: AC | PRN

## 2017-05-29 NOTE — Patient Instructions (Addendum)
Please follow up in 2 weeks   Back Exercises If you have pain in your back, do these exercises 2-3 times each day or as told by your doctor. When the pain goes away, do the exercises once each day, but repeat the steps more times for each exercise (do more repetitions). If you do not have pain in your back, do these exercises once each day or as told by your doctor. Exercises Single Knee to Chest  Do these steps 3-5 times in a row for each leg: 1. Lie on your back on a firm bed or the floor with your legs stretched out. 2. Bring one knee to your chest. 3. Hold your knee to your chest by grabbing your knee or thigh. 4. Pull on your knee until you feel a gentle stretch in your lower back. 5. Keep doing the stretch for 10-30 seconds. 6. Slowly let go of your leg and straighten it.  Pelvic Tilt  Do these steps 5-10 times in a row: 1. Lie on your back on a firm bed or the floor with your legs stretched out. 2. Bend your knees so they point up to the ceiling. Your feet should be flat on the floor. 3. Tighten your lower belly (abdomen) muscles to press your lower back against the floor. This will make your tailbone point up to the ceiling instead of pointing down to your feet or the floor. 4. Stay in this position for 5-10 seconds while you gently tighten your muscles and breathe evenly.  Cat-Cow  Do these steps until your lower back bends more easily: 1. Get on your hands and knees on a firm surface. Keep your hands under your shoulders, and keep your knees under your hips. You may put padding under your knees. 2. Let your head hang down, and make your tailbone point down to the floor so your lower back is round like the back of a cat. 3. Stay in this position for 5 seconds. 4. Slowly lift your head and make your tailbone point up to the ceiling so your back hangs low (sags) like the back of a cow. 5. Stay in this position for 5 seconds.  Press-Ups  Do these steps 5-10 times in a  row: 1. Lie on your belly (face-down) on the floor. 2. Place your hands near your head, about shoulder-width apart. 3. While you keep your back relaxed and keep your hips on the floor, slowly straighten your arms to raise the top half of your body and lift your shoulders. Do not use your back muscles. To make yourself more comfortable, you may change where you place your hands. 4. Stay in this position for 5 seconds. 5. Slowly return to lying flat on the floor.  Bridges  Do these steps 10 times in a row: 1. Lie on your back on a firm surface. 2. Bend your knees so they point up to the ceiling. Your feet should be flat on the floor. 3. Tighten your butt muscles and lift your butt off of the floor until your waist is almost as high as your knees. If you do not feel the muscles working in your butt and the back of your thighs, slide your feet 1-2 inches farther away from your butt. 4. Stay in this position for 3-5 seconds. 5. Slowly lower your butt to the floor, and let your butt muscles relax.  If this exercise is too easy, try doing it with your arms crossed over your chest. Belly  Crunches  Do these steps 5-10 times in a row: 1. Lie on your back on a firm bed or the floor with your legs stretched out. 2. Bend your knees so they point up to the ceiling. Your feet should be flat on the floor. 3. Cross your arms over your chest. 4. Tip your chin a little bit toward your chest but do not bend your neck. 5. Tighten your belly muscles and slowly raise your chest just enough to lift your shoulder blades a tiny bit off of the floor. 6. Slowly lower your chest and your head to the floor.  Back Lifts Do these steps 5-10 times in a row: 1. Lie on your belly (face-down) with your arms at your sides, and rest your forehead on the floor. 2. Tighten the muscles in your legs and your butt. 3. Slowly lift your chest off of the floor while you keep your hips on the floor. Keep the back of your head in  line with the curve in your back. Look at the floor while you do this. 4. Stay in this position for 3-5 seconds. 5. Slowly lower your chest and your face to the floor.  Contact a doctor if:  Your back pain gets a lot worse when you do an exercise.  Your back pain does not lessen 2 hours after you exercise. If you have any of these problems, stop doing the exercises. Do not do them again unless your doctor says it is okay. Get help right away if:  You have sudden, very bad back pain. If this happens, stop doing the exercises. Do not do them again unless your doctor says it is okay. This information is not intended to replace advice given to you by your health care provider. Make sure you discuss any questions you have with your health care provider. Document Released: 03/23/2010 Document Revised: 07/27/2015 Document Reviewed: 04/14/2014 Elsevier Interactive Patient Education  2018 [employer].   Shoulder Range of Motion Exercises Shoulder range of motion (ROM) exercises are designed to keep the shoulder moving freely. They are often recommended for people who have shoulder pain. Phase 1 exercises When you are able, do this exercise 5-6 days per week, or as told by your health care provider. Work toward doing 2 sets of 10 swings. Pendulum Exercise How To Do This Exercise Lying Down 1. Lie face-down on a bed with your abdomen close to the side of the bed. 2. Let your arm hang over the side of the bed. 3. Relax your shoulder, arm, and hand. 4. Slowly and gently swing your arm forward and back. Do not use your neck muscles to swing your arm. They should be relaxed. If you are struggling to swing your arm, have someone gently swing it for you. When you do this exercise for the first time, swing your arm at a 15 degree angle for 15 seconds, or swing your arm 10 times. As pain lessens over time, increase the angle of the swing to 30-45 degrees. 5. Repeat steps 1-4 with the other arm.  How To  Do This Exercise While Standing 1. Stand next to a sturdy chair or table and hold on to it with your hand. 1. Bend forward at the waist. 2. Bend your knees slightly. 3. Relax your other arm and let it hang limp. 4. Relax the shoulder blade of the arm that is hanging and let it drop. 5. While keeping your shoulder relaxed, use body motion to swing your arm  in small circles. The first time you do this exercise, swing your arm for about 30 seconds or 10 times. When you do it next time, swing your arm for a little longer. 6. Stand up tall and relax. 7. Repeat steps 1-7, this time changing the direction of the circles. 2. Repeat steps 1-8 with the other arm.  Phase 2 exercises Do these exercises 3-4 times per day on 5-6 days per week or as told by your health care provider. Work toward holding the stretch for 20 seconds. Stretching Exercise 1 1. Lift your arm straight out in front of you. 2. Bend your arm 90 degrees at the elbow (right angle) so your forearm goes across your body and looks like the letter "L." 3. Use your other arm to gently pull the elbow forward and across your body. 4. Repeat steps 1-3 with the other arm. Stretching Exercise 2 You will need a towel or rope for this exercise. 1. Bend one arm behind your back with the palm facing outward. 2. Hold a towel with your other hand. 3. Reach the arm that holds the towel above your head, and bend that arm at the elbow. Your wrist should be behind your neck. 4. Use your free hand to grab the free end of the towel. 5. With the higher hand, gently pull the towel up behind you. 6. With the lower hand, pull the towel down behind you. 7. Repeat steps 1-6 with the other arm.  Phase 3 exercises Do each of these exercises at four different times of day (sessions) every day or as told by your health care provider. To begin with, repeat each exercise 5 times (repetitions). Work toward doing 3 sets of 12 repetitions or as told by your health  care provider. Strengthening Exercise 1 You will need a light weight for this activity. As you grow stronger, you may use a heavier weight. 1. Standing with a weight in your hand, lift your arm straight out to the side until it is at the same height as your shoulder. 2. Bend your arm at 90 degrees so that your fingers are pointing to the ceiling. 3. Slowly raise your hand until your arm is straight up in the air. 4. Repeat steps 1-3 with the other arm.  Strengthening Exercise 2 You will need a light weight for this activity. As you grow stronger, you may use a heavier weight. 1. Standing with a weight in your hand, gradually move your straight arm in an arc, starting at your side, then out in front of you, then straight up over your head. 2. Gradually move your other arm in an arc, starting at your side, then out in front of you, then straight up over your head. 3. Repeat steps 1-2 with the other arm.  Strengthening Exercise 3 You will need an elastic band for this activity. As you grow stronger, gradually increase the size of the bands or increase the number of bands that you use at one time. 1. While standing, hold an elastic band in one hand and raise that arm up in the air. 2. With your other hand, pull down the band until that hand is by your side. 3. Repeat steps 1-2 with the other arm.  This information is not intended to replace advice given to you by your health care provider. Make sure you discuss any questions you have with your health care provider. Document Released: 11/17/2002 Document Revised: 10/15/2015 Document Reviewed: 02/14/2014 Elsevier Interactive Patient Education  2018 [employer] Inc[employer]  

## 2017-05-30 ENCOUNTER — Encounter: Payer: Self-pay | Admitting: Internal Medicine

## 2017-06-02 ENCOUNTER — Ambulatory Visit
Admission: RE | Admit: 2017-06-02 | Discharge: 2017-06-02 | Disposition: A | Discharge status: Home / Self Care | Payer: Medicaid Other | Source: Ambulatory Visit | Attending: Family Medicine | Admitting: Family Medicine

## 2017-06-02 DIAGNOSIS — N6310 Unspecified lump in the right breast, unspecified quadrant: Principal | ICD-10-CM

## 2017-06-02 DIAGNOSIS — N6315 Unspecified lump in the right breast, overlapping quadrants: Secondary | ICD-10-CM
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
## 2017-06-13 ENCOUNTER — Other Ambulatory Visit: Payer: Self-pay

## 2017-06-13 ENCOUNTER — Encounter: Payer: Self-pay | Admitting: Internal Medicine

## 2017-06-13 ENCOUNTER — Ambulatory Visit (INDEPENDENT_AMBULATORY_CARE_PROVIDER_SITE_OTHER): Payer: Self-pay | Admitting: Internal Medicine

## 2017-06-13 VITALS — BP 122/78 | HR 87 | Temp 98.1°F | Ht 67.0 in | Wt 278.0 lb

## 2017-06-13 DIAGNOSIS — M25561 Pain in right knee: Secondary | ICD-10-CM

## 2017-06-13 MED ORDER — MELOXICAM 15 MG PO TABS: 15.0000 mg | ORAL_TABLET | Freq: Every day | ORAL | 0 refills | Status: DC | PRN

## 2017-06-13 NOTE — Patient Instructions (Addendum)
You can try Mobic for your knee.  Ice the area.  You can also use a compression sleeve.  Xray of the knee was ordered    Knee Exercises Ask your health care provider which exercises are safe for you. Do exercises exactly as told by your health care provider and adjust them as directed. It is normal to feel mild stretching, pulling, tightness, or discomfort as you do these exercises, but you should stop right away if you feel sudden pain or your pain gets worse.Do not begin these exercises until told by your health care provider. STRETCHING AND RANGE OF MOTION EXERCISES These exercises warm up your muscles and joints and improve the movement and flexibility of your knee. These exercises also help to relieve pain, numbness, and tingling. Exercise A: Knee Extension, Prone 1. Lie on your abdomen on a bed. 2. Place your left / right knee just beyond the edge of the surface so your knee is not on the bed. You can put a towel under your left / right thigh just above your knee for comfort. 3. Relax your leg muscles and allow gravity to straighten your knee. You should feel a stretch behind your left / right knee. 4. Hold this position for __________ seconds. 5. Scoot up so your knee is supported between repetitions. Repeat __________ times. Complete this stretch __________ times a day. Exercise B: Knee Flexion, Active  1. Lie on your back with both knees straight. If this causes back discomfort, bend your left / right knee so your foot is flat on the floor. 2. Slowly slide your left / right heel back toward your buttocks until you feel a gentle stretch in the front of your knee or thigh. 3. Hold this position for __________ seconds. 4. Slowly slide your left / right heel back to the starting position. Repeat __________ times. Complete this exercise __________ times a day. Exercise C: Quadriceps, Prone  1. Lie on your abdomen on a firm surface, such as a bed or padded floor. 2. Bend your left /  right knee and hold your ankle. If you cannot reach your ankle or pant leg, loop a belt around your foot and grab the belt instead. 3. Gently pull your heel toward your buttocks. Your knee should not slide out to the side. You should feel a stretch in the front of your thigh and knee. 4. Hold this position for __________ seconds. Repeat __________ times. Complete this stretch __________ times a day. Exercise D: Hamstring, Supine 1. Lie on your back. 2. Loop a belt or towel over the ball of your left / right foot. The ball of your foot is on the walking surface, right under your toes. 3. Straighten your left / right knee and slowly pull on the belt to raise your leg until you feel a gentle stretch behind your knee. ? Do not let your left / right knee bend while you do this. ? Keep your other leg flat on the floor. 4. Hold this position for __________ seconds. Repeat __________ times. Complete this stretch __________ times a day. STRENGTHENING EXERCISES These exercises build strength and endurance in your knee. Endurance is the ability to use your muscles for a long time, even after they get tired. Exercise E: Quadriceps, Isometric  1. Lie on your back with your left / right leg extended and your other knee bent. Put a rolled towel or small pillow under your knee if told by your health care provider. 2. Slowly tense the  muscles in the front of your left / right thigh. You should see your kneecap slide up toward your hip or see increased dimpling just above the knee. This motion will push the back of the knee toward the floor. 3. For __________ seconds, keep the muscle as tight as you can without increasing your pain. 4. Relax the muscles slowly and completely. Repeat __________ times. Complete this exercise __________ times a day. Exercise F: Straight Leg Raises - Quadriceps 1. Lie on your back with your left / right leg extended and your other knee bent. 2. Tense the muscles in the front of  your left / right thigh. You should see your kneecap slide up or see increased dimpling just above the knee. Your thigh may even shake a bit. 3. Keep these muscles tight as you raise your leg 4-6 inches (10-15 cm) off the floor. Do not let your knee bend. 4. Hold this position for __________ seconds. 5. Keep these muscles tense as you lower your leg. 6. Relax your muscles slowly and completely after each repetition. Repeat __________ times. Complete this exercise __________ times a day. Exercise G: Hamstring, Isometric 1. Lie on your back on a firm surface. 2. Bend your left / right knee approximately __________ degrees. 3. Dig your left / right heel into the surface as if you are trying to pull it toward your buttocks. Tighten the muscles in the back of your thighs to dig as hard as you can without increasing any pain. 4. Hold this position for __________ seconds. 5. Release the tension gradually and allow your muscles to relax completely for __________ seconds after each repetition. Repeat __________ times. Complete this exercise __________ times a day. Exercise H: Hamstring Curls  If told by your health care provider, do this exercise while wearing ankle weights. Begin with __________ weights. Then increase the weight by 1 lb (0.5 kg) increments. Do not wear ankle weights that are more than __________. 1. Lie on your abdomen with your legs straight. 2. Bend your left / right knee as far as you can without feeling pain. Keep your hips flat against the floor. 3. Hold this position for __________ seconds. 4. Slowly lower your leg to the starting position.  Repeat __________ times. Complete this exercise __________ times a day. Exercise I: Squats (Quadriceps) 1. Stand in front of a table, with your feet and knees pointing straight ahead. You may rest your hands on the table for balance but not for support. 2. Slowly bend your knees and lower your hips like you are going to sit in a  chair. ? Keep your weight over your heels, not over your toes. ? Keep your lower legs upright so they are parallel with the table legs. ? Do not let your hips go lower than your knees. ? Do not bend lower than told by your health care provider. ? If your knee pain increases, do not bend as low. 3. Hold the squat position for __________ seconds. 4. Slowly push with your legs to return to standing. Do not use your hands to pull yourself to standing. Repeat __________ times. Complete this exercise __________ times a day. Exercise J: Wall Slides (Quadriceps)  1. Lean your back against a smooth wall or door while you walk your feet out 18-24 inches (46-61 cm) from it. 2. Place your feet hip-width apart. 3. Slowly slide down the wall or door until your knees bend __________ degrees. Keep your knees over your heels, not over your toes.  Keep your knees in line with your hips. 4. Hold for __________ seconds. Repeat __________ times. Complete this exercise __________ times a day. Exercise K: Straight Leg Raises - Hip Abductors 1. Lie on your side with your left / right leg in the top position. Lie so your head, shoulder, knee, and hip line up. You may bend your bottom knee to help you keep your balance. 2. Roll your hips slightly forward so your hips are stacked directly over each other and your left / right knee is facing forward. 3. Leading with your heel, lift your top leg 4-6 inches (10-15 cm). You should feel the muscles in your outer hip lifting. ? Do not let your foot drift forward. ? Do not let your knee roll toward the ceiling. 4. Hold this position for __________ seconds. 5. Slowly return your leg to the starting position. 6. Let your muscles relax completely after each repetition. Repeat __________ times. Complete this exercise __________ times a day. Exercise L: Straight Leg Raises - Hip Extensors 1. Lie on your abdomen on a firm surface. You can put a pillow under your hips if that is  more comfortable. 2. Tense the muscles in your buttocks and lift your left / right leg about 4-6 inches (10-15 cm). Keep your knee straight as you lift your leg. 3. Hold this position for __________ seconds. 4. Slowly lower your leg to the starting position. 5. Let your leg relax completely after each repetition. Repeat __________ times. Complete this exercise __________ times a day. This information is not intended to replace advice given to you by your health care provider. Make sure you discuss any questions you have with your health care provider. Document Released: 01/02/2005 Document Revised: 11/13/2015 Document Reviewed: 12/25/2014 Elsevier Interactive Patient Education  2018 [employer].

## 2017-06-16 ENCOUNTER — Encounter: Payer: Self-pay | Admitting: Internal Medicine

## 2017-06-18 ENCOUNTER — Encounter: Payer: Self-pay | Admitting: Internal Medicine

## 2017-06-18 ENCOUNTER — Ambulatory Visit (HOSPITAL_COMMUNITY)
Admission: RE | Admit: 2017-06-18 | Discharge: 2017-06-18 | Disposition: A | Discharge status: Home / Self Care | Payer: Medicaid Other | Source: Ambulatory Visit | Attending: Family Medicine | Admitting: Family Medicine

## 2017-06-18 DIAGNOSIS — M25561 Pain in right knee: Secondary | ICD-10-CM | POA: Insufficient documentation

## 2017-09-10 ENCOUNTER — Encounter: Payer: Self-pay | Admitting: Family Medicine

## 2017-09-10 NOTE — Progress Notes (Deleted)
   Subjective:    Patient ID: Mackenzie Gibson, female    DOB: [birth year], 32 y.o.   MRN: [id number]   CC:  HPI:   Smoking status reviewed  Review of Systems Per HPI, also denies recent illness, fever, headache, changes in vision, chest pain, shortness of breath, abdominal pain, N/V/D, weakness   Patient Active Problem List   Diagnosis Date Noted  . Heavy menstrual bleeding 10/21/2015  . S/P tubal ligation 03/03/2015  . Asthmatic bronchitis , chronic ([place]) 02/09/2015  . Supervision of high-risk pregnancy 10/25/2014  . Maternal morbid obesity, antepartum ([place])   . Bipolar disorder ([place]) 01/12/2014  . Chronic pelvic pain in female 01/12/2014  . Eczema, dyshidrotic 12/20/2013  . TOBACCO USER 03/06/2009  . SOMATIZATION DISORDER 07/27/2008  . PANIC DISORDER 10/20/2006  . POLYCYSTIC OVARIAN DISEASE 03/04/2004     Objective:  There were no vitals taken for this visit. Vitals and nursing note reviewed  General: NAD, pleasant Cardiac: RRR, normal heart sounds, no murmurs Respiratory: CTAB, normal effort Abdomen: soft, nontender, nondistended Extremities: no edema or cyanosis. WWP. Skin: warm and dry, no rashes noted Neuro: alert and oriented, no focal deficits Psych: normal affect  Assessment & Plan:    No problem-specific Assessment & Plan notes found for this encounter.  Due for pap smear***  [name] Kelby Lotspeich, DO Family Medicine Resident PGY-2

## 2017-09-12 ENCOUNTER — Ambulatory Visit: Payer: Self-pay | Admitting: Family Medicine

## 2017-12-03 ENCOUNTER — Emergency Department (HOSPITAL_COMMUNITY): Payer: Medicaid Other

## 2017-12-03 ENCOUNTER — Encounter (HOSPITAL_COMMUNITY): Payer: Self-pay | Admitting: Emergency Medicine

## 2017-12-03 ENCOUNTER — Emergency Department (HOSPITAL_COMMUNITY)
Admission: EM | Admit: 2017-12-03 | Discharge: 2017-12-03 | Disposition: A | Discharge status: Home / Self Care | Payer: Medicaid Other | Attending: Emergency Medicine | Admitting: Emergency Medicine

## 2017-12-03 DIAGNOSIS — Z79899 Other long term (current) drug therapy: Secondary | ICD-10-CM | POA: Insufficient documentation

## 2017-12-03 DIAGNOSIS — J209 Acute bronchitis, unspecified: Secondary | ICD-10-CM | POA: Insufficient documentation

## 2017-12-03 DIAGNOSIS — J40 Bronchitis, not specified as acute or chronic: Secondary | ICD-10-CM

## 2017-12-03 DIAGNOSIS — R519 Headache, unspecified: Secondary | ICD-10-CM

## 2017-12-03 DIAGNOSIS — R51 Headache: Secondary | ICD-10-CM | POA: Insufficient documentation

## 2017-12-03 DIAGNOSIS — F17210 Nicotine dependence, cigarettes, uncomplicated: Secondary | ICD-10-CM | POA: Insufficient documentation

## 2017-12-03 LAB — CBC
HEMATOCRIT: 47.6 % — AB (ref 36.0–46.0)
HEMOGLOBIN: 15.2 g/dL — AB (ref 12.0–15.0)
MCH: 27.7 pg (ref 26.0–34.0)
MCHC: 31.9 g/dL (ref 30.0–36.0)
MCV: 86.7 fL (ref 78.0–100.0)
Platelets: 220 10*3/uL (ref 150–400)
RBC: 5.49 MIL/uL — ABNORMAL HIGH (ref 3.87–5.11)
RDW: 13.2 % (ref 11.5–15.5)
WBC: 16 10*3/uL — ABNORMAL HIGH (ref 4.0–10.5)

## 2017-12-03 LAB — BASIC METABOLIC PANEL
ANION GAP: 11 (ref 5–15)
BUN: 10 mg/dL (ref 6–20)
CALCIUM: 9.3 mg/dL (ref 8.9–10.3)
CO2: 21 mmol/L — ABNORMAL LOW (ref 22–32)
CREATININE: 0.77 mg/dL (ref 0.44–1.00)
Chloride: 107 mmol/L (ref 98–111)
GFR calc Af Amer: >60 mL/min (ref >60)
GFR calc non Af Amer: >60 mL/min (ref >60)
GLUCOSE: 100 mg/dL — AB (ref 70–99)
Potassium: 4 mmol/L (ref 3.5–5.1)
Sodium: 139 mmol/L (ref 135–145)

## 2017-12-03 LAB — I-STAT BETA HCG BLOOD, ED (MC, WL, AP ONLY)

## 2017-12-03 LAB — I-STAT TROPONIN, ED: Troponin i, poc: 0 ng/mL (ref 0.00–0.08)

## 2017-12-03 MED ORDER — AEROCHAMBER Z-STAT PLUS/MEDIUM MISC
1.0000 | Freq: Once | Status: DC
  Filled 2017-12-03: qty 1

## 2017-12-03 MED ORDER — ALBUTEROL SULFATE HFA 108 (90 BASE) MCG/ACT IN AERS
2.0000 | INHALATION_SPRAY | Freq: Once | RESPIRATORY_TRACT | Status: AC
  Administered 2017-12-03: 2 via RESPIRATORY_TRACT
  Filled 2017-12-03: qty 6.7

## 2017-12-03 MED ORDER — KETOROLAC TROMETHAMINE 60 MG/2ML IM SOLN
60.0000 mg | Freq: Once | INTRAMUSCULAR | Status: AC
  Administered 2017-12-03: 60 mg via INTRAMUSCULAR
  Filled 2017-12-03: qty 2

## 2017-12-03 NOTE — Discharge Instructions (Signed)
The testing today was reassuring.  There is no sign of high blood pressure, stroke, heart attack or pneumonia.  Make sure you are drinking plenty of fluids.  Follow-up with your doctor as needed for problems.

## 2017-12-03 NOTE — ED Triage Notes (Signed)
Patient to ED c/o migraine onset this morning, took 1g Tylenol without relief. Patient endorses N/V x 1 this morning, photosensitivity, dizziness. She also reports recent bronchitis but didn't finish entire course of steroids because she was feeling better. Continues to have intermittent L sided CP going to L shoulder. Resp e/u, skin flushed.

## 2017-12-03 NOTE — ED Notes (Signed)
Patient verbalizes understanding of discharge instructions. Opportunity for questioning and answers were provided. Armband removed by staff, pt discharged from ED ambulatory.   

## 2017-12-03 NOTE — ED Provider Notes (Signed)
[facility] EMERGENCY DEPARTMENT Provider Note   CSN: [id number] Arrival date & time: 12/03/17  1116     History   Chief Complaint Chief Complaint  Patient presents with  . Chest Pain  . Headache    HPI Mackenzie Gibson is a 32 y.o. female.  HPI   She presents for evaluation of "migraine headache."  She took Tylenol without relief this morning.  She has had some vomiting today.  He complains of light sensitivity and dizziness.  Headache started this morning.  She recently completed some prednisone for bronchitis.  She does not have an inhaler currently because she "cannot afford it."  She denies fever, chills, diarrhea, back pain.  There are no other known modifying factors.  Past Medical History:  Diagnosis Date  . Anxiety   . Bipolar disorder ([place])   . Bipolar disorder ([place])   . 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 Depression   . PCOS (polycystic ovarian syndrome)   . Pregnancy induced hypertension     Patient Active Problem List   Diagnosis Date Noted  . Heavy menstrual bleeding 10/21/2015  . S/P tubal ligation 03/03/2015  . Asthmatic bronchitis , chronic ([place]) 02/09/2015  . Maternal morbid obesity, antepartum ([place])   . Bipolar disorder ([place]) 01/12/2014  . Chronic pelvic pain in female 01/12/2014  . Eczema, dyshidrotic 12/20/2013  . TOBACCO USER 03/06/2009  . SOMATIZATION DISORDER 07/27/2008  . PANIC DISORDER 10/20/2006  . POLYCYSTIC OVARIAN DISEASE 03/04/2004    Past Surgical History:  Procedure Laterality Date  . CHOLECYSTECTOMY    . EYE SURGERY    . TUBAL LIGATION Bilateral 03/03/2015   Procedure: POST PARTUM TUBAL LIGATION;  Surgeon: Guss Bunde, MD;  Location: [facility];  Service: Gynecology;  Laterality: Bilateral;     OB History    Gravida  5   Para  4   Term  4   Preterm  0   AB  1   Living  4     SAB  1   TAB  0   Ectopic  0   Multiple  0   Live Births  4            Home Medications     Prior to Admission medications   Medication Sig Start Date End Date Taking? Authorizing Provider  acetaminophen (TYLENOL) 500 MG tablet Take 2 tablets (1,000 mg total) by mouth every 6 (six) hours as needed for mild pain or headache. 01/19/15   Anyanwu, Sallyanne Havers, MD  albuterol (PROVENTIL HFA;VENTOLIN HFA) 108 (90 BASE) MCG/ACT inhaler Inhale 1-2 puffs into the lungs every 6 (six) hours as needed for wheezing or shortness of breath. 01/19/15   Anyanwu, Sallyanne Havers, MD  beclomethasone (QVAR) 40 MCG/ACT inhaler Inhale 2 puffs into the lungs 2 (two) times daily. 02/09/15   Woodroe Mode, MD  clindamycin-benzoyl peroxide [facility]) gel Apply topically 2 (two) times daily. To affected areas (acne) 04/22/17   Smiley Houseman, MD  ibuprofen (ADVIL,MOTRIN) 800 MG tablet Take 1 tablet (800 mg total) by mouth 3 (three) times daily. 05/18/17   Domenic Moras, PA-C  meloxicam (MOBIC) 15 MG tablet Take 1 tablet (15 mg total) by mouth daily as needed for pain. 06/13/17   Smiley Houseman, MD  metroNIDAZOLE (FLAGYL) 500 MG tablet Take 1 tablet (500 mg total) by mouth 2 (two) times daily. 11/25/16   Smiley Houseman, MD  norethindrone (ORTHO MICRONOR) 0.35 MG tablet Take 1 tablet (  0.35 mg total) by mouth daily. 04/23/17   Smiley Houseman, MD    Family History Family History  Problem Relation Age of Onset  . Depression Mother   . Bipolar disorder Mother   . Rheum arthritis Mother   . Depression Father   . High blood pressure Father   . Bipolar disorder Maternal Aunt     Social History Social History   Tobacco Use  . Smoking status: Current Every Day Smoker    Packs/day: 1.00    Types: Cigarettes  . Smokeless tobacco: Never Used  Substance Use Topics  . Alcohol use: No  . Drug use: No     Allergies   Hydrocodone   Review of Systems Review of Systems  All other systems reviewed and are negative.    Physical Exam Updated Vital Signs BP (!) 119/58   Pulse 71   Temp 97.7 F  (36.5 C) (Oral)   Resp 18   LMP 12/25/2016   SpO2 98%   Physical Exam  Constitutional: She is oriented to person, place, and time. She appears well-developed. She does not appear ill.  Obese  HENT:  Head: Normocephalic and atraumatic.  Eyes: Pupils are equal, round, and reactive to light. Conjunctivae and EOM are normal.  Neck: Normal range of motion and phonation normal. Neck supple.  No meningismus.  Cardiovascular: Normal rate and regular rhythm.  Pulmonary/Chest: Effort normal. No stridor. No respiratory distress. She has no wheezes. She exhibits no tenderness.  Decreased air movement bilaterally with scattered rhonchi.  Abdominal: Soft. She exhibits no distension. There is no tenderness. There is no guarding.  Musculoskeletal: Normal range of motion.  Neurological: She is alert and oriented to person, place, and time. She exhibits normal muscle tone.  No dysarthria or aphasia  Skin: Skin is warm and dry.  Psychiatric: She has a normal mood and affect. Her behavior is normal. Judgment and thought content normal.  Nursing note and vitals reviewed.    ED Treatments / Results  Labs (all labs ordered are listed, but only abnormal results are displayed) Labs Reviewed  BASIC METABOLIC PANEL - Abnormal; Notable for the following components:      Result Value   CO2 21 (*)    Glucose, Bld 100 (*)    All other components within normal limits  CBC - Abnormal; Notable for the following components:   WBC 16.0 (*)    RBC 5.49 (*)    Hemoglobin 15.2 (*)    HCT 47.6 (*)    All other components within normal limits  I-STAT TROPONIN, ED  I-STAT BETA HCG BLOOD, ED ([facility], [facility], [facility] ONLY)    EKG EKG Interpretation  Date/Time:  Wednesday December 03 2017 11:20:15 EDT Ventricular Rate:  84 PR Interval:  120 QRS Duration: 86 QT Interval:  388 QTC Calculation: 458 R Axis:   43 Text Interpretation:  Normal sinus rhythm Normal ECG No significant change since last tracing Confirmed by Wandra Arthurs [phone]) on 12/03/2017 12:09:53 PM   Radiology Dg Chest 2 View  Result Date: 12/03/2017 CLINICAL DATA:  Headache and vomiting. Recent history of bronchitis. History of smoking. EXAM: CHEST - 2 VIEW COMPARISON:  11/20/2017 FINDINGS: The cardiomediastinal silhouette is unchanged and within normal limits. The lungs are well inflated, and there is mild peribronchial thickening. No airspace consolidation, edema, pleural effusion, pneumothorax is identified. No acute osseous abnormality is seen. IMPRESSION: Mild bronchitic changes. Electronically Signed   By: Logan Bores M.D.   On:  12/03/2017 11:53    Procedures Procedures (including critical care time)  Medications Ordered in ED Medications  aerochamber Z-Stat Plus/medium 1 each (has no administration in time range)  ketorolac (TORADOL) injection 60 mg (60 mg Intramuscular Given 12/03/17 1425)  albuterol (PROVENTIL HFA;VENTOLIN HFA) 108 (90 Base) MCG/ACT inhaler 2 puff (2 puffs Inhalation Given 12/03/17 1425)     Initial Impression / Assessment and Plan / ED Course  I have reviewed the triage vital signs and the nursing notes.  Pertinent labs & imaging results that were available during my care of the patient were reviewed by me and considered in my medical decision making (see chart for details).  Clinical Course as of Dec 04 1599  Wed Dec 03, 2017  1559 Normal  I-stat troponin, ED [EW]  1559 Normal except white count elevated  CBC(!) [EW]  1559 Normal  I-Stat beta hCG blood, ED [EW]  1559 Normal  Basic metabolic panel(!) [EW]  2585 Consistent with bronchitis, no infiltrate, images reviewed by me  DG Chest 2 View [EW]    Clinical Course User Index [EW] Daleen Bo, MD     Patient Vitals for the past 24 hrs:  BP Temp Temp src Pulse Resp SpO2  12/03/17 1500 (!) 119/58 - - 71 18 98 %  12/03/17 1415 116/75 - - 71 (!) 23 100 %  12/03/17 1330 119/83 - - 80 (!) 25 100 %  12/03/17 1120 (!) 141/85 97.7 F (36.5 C) Oral 87  18 95 %    3:56 PM Reevaluation with update and discussion. After initial assessment and treatment, an updated evaluation reveals headache resolved, no further complaints, findings discussed with the patient and all questions were answered. Daleen Bo   Medical Decision Making: Specific headache with resolving bronchitis.  Doubt pneumonia, metabolic instability or impending vascular collapse.  CRITICAL CARE-no Performed by: Daleen Bo    Final Clinical Impressions(s) / ED Diagnoses   Final diagnoses:  Nonintractable headache, unspecified chronicity pattern, unspecified headache type  Bronchitis    ED Discharge Orders    None       Daleen Bo, MD 12/03/17 1601

## 2017-12-05 ENCOUNTER — Ambulatory Visit (INDEPENDENT_AMBULATORY_CARE_PROVIDER_SITE_OTHER): Payer: Self-pay | Admitting: Family Medicine

## 2017-12-05 DIAGNOSIS — G43909 Migraine, unspecified, not intractable, without status migrainosus: Secondary | ICD-10-CM | POA: Insufficient documentation

## 2017-12-05 DIAGNOSIS — G43009 Migraine without aura, not intractable, without status migrainosus: Secondary | ICD-10-CM

## 2017-12-05 MED ORDER — IBUPROFEN 800 MG PO TABS: 800.0000 mg | ORAL_TABLET | Freq: Three times a day (TID) | ORAL | 0 refills | Status: DC

## 2017-12-05 NOTE — Patient Instructions (Signed)
Migraine Headache A migraine headache is a very strong throbbing pain on one side or both sides of your head. Migraines can also cause other symptoms. Talk with your doctor about what things may bring on (trigger) your migraine headaches. Follow these instructions at home: Medicines  Take over-the-counter and prescription medicines only as told by your doctor.  Do not drive or use heavy machinery while taking prescription pain medicine.  To prevent or treat constipation while you are taking prescription pain medicine, your doctor may recommend that you: ? Drink enough fluid to keep your pee (urine) clear or pale yellow. ? Take over-the-counter or prescription medicines. ? Eat foods that are high in fiber. These include fresh fruits and vegetables, whole grains, and beans. ? Limit foods that are high in fat and processed sugars. These include fried and sweet foods. Lifestyle  Avoid alcohol.  Do not use any products that contain nicotine or tobacco, such as cigarettes and e-cigarettes. If you need help quitting, ask your doctor.  Get at least 8 hours of sleep every night.  Limit your stress. General instructions   Keep a journal to find out what may bring on your migraines. For example, write down: ? What you eat and drink. ? How much sleep you get. ? Any change in what you eat or drink. ? Any change in your medicines.  If you have a migraine: ? Avoid things that make your symptoms worse, such as bright lights. ? It may help to lie down in a dark, quiet room. ? Do not drive or use heavy machinery. ? Ask your doctor what activities are safe for you.  Keep all follow-up visits as told by your doctor. This is important. Contact a doctor if:  You get a migraine that is different or worse than your usual migraines. Get help right away if:  Your migraine gets very bad.  You have a fever.  You have a stiff neck.  You have trouble seeing.  Your muscles feel weak or like you  cannot control them.  You start to lose your balance a lot.  You start to have trouble walking.  You pass out (faint). This information is not intended to replace advice given to you by your health care provider. Make sure you discuss any questions you have with your health care provider. Document Released: 11/28/2007 Document Revised: 09/08/2015 Document Reviewed: 08/07/2015 Elsevier Interactive Patient Education  [employer].  It was a pleasure seeing you today.   Today we discussed your migraine  For your migraine: luckily your migraine has resolved. For your migraines I have refilled your ibuprofen. Use this as needed.   For your chronic medical problems please schedule a follow up with your PCP.   Please follow up in 1 month if no improvement with headaches or sooner if symptoms persist or worsen. Please call the clinic immediately if you have any concerns.   Our clinic's number is [phone]. Please call with questions or concerns.   Please go to the emergency room if you have worsening headaches that do not resolve with ibuprofen, vision changes, slurred speech, facial droop, or weakness.   Thank you,  Caroline More, DO

## 2017-12-05 NOTE — Assessment & Plan Note (Signed)
Patient with symptoms consistent of migraine headache without aura.  This is only occurred once and was resolved with Toradol shot and patient no longer has any headaches.  Given that patient has only had one headache no chronic medications needed at this time.  Advised patient to use ibuprofen as needed for headaches.  Patient has persistent headaches can follow-up with consideration of controller medications.  Normal neuro exam.  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  Strict return precautions given.  Follow-up if no improvement.

## 2017-12-05 NOTE — Progress Notes (Signed)
   Subjective:    Patient ID: Mackenzie Gibson, female    DOB: May 01, 1985, 32 y.o.   MRN: [id number]   CC: ED follow up  HPI: ED follow up Presenting today for follow-up from emergency department for migraine headaches.  Patient was seen on emergency department with migraine headache.  States that she woke up that morning at 6:30 in the morning with a headache.  This headache lasted for 7 hours.  Patient states that she vomited multiple times during the day and 4 times in the emergency department.  Denies any vision changes at that time.  Reports photo and phonophobia.  Patient states that when she went to the emergency department her blood pressure was 180/120.  In the emergency department patient received a Toradol shot which resolved headache.  Reports that headache felt like a stabbing pain that radiated from her frontal area to the occiput.  States that she drank coffee and [facility] in the morning and this did not help headache.  Patient normally gets headaches that are described as tension headaches that occur on and off.  These headaches improved with water and ibuprofen.  Patient did not have ibuprofen at home this time so she took Tylenol which did not help.  Patient has never had a migraine before other than during pregnancy.  No auro described. Patient denies any headaches today.  Smoking status reviewed.  Smokes half to 1 pack/day.   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 Bowel sounds present Extremities: no edema or cyanosis. Warm, well perfused. 2+ radial and PT pulses bilaterally Skin: warm and dry, no rashes noted Neuro: alert and oriented, no focal deficits, CN2-12 intact without deficits, normal grip strength, 5/5 muscle strength   Assessment & Plan:    Migraine Patient with symptoms consistent of migraine headache without aura.  This is only occurred once and was resolved with Toradol shot and patient no longer has any headaches.  Given that patient has only had one headache no chronic medications needed at this time.  Advised patient to use ibuprofen as needed for headaches.  Patient has persistent headaches can follow-up with consideration of controller medications.  Normal neuro exam.  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  Strict return precautions given.  Follow-up if no improvement.    Return if symptoms worsen or fail to improve.   Caroline More, DO, PGY-2

## 2018-01-02 ENCOUNTER — Encounter: Payer: Self-pay | Admitting: Family Medicine

## 2018-01-02 ENCOUNTER — Other Ambulatory Visit: Payer: Self-pay

## 2018-01-02 ENCOUNTER — Ambulatory Visit (INDEPENDENT_AMBULATORY_CARE_PROVIDER_SITE_OTHER): Payer: Self-pay | Admitting: Family Medicine

## 2018-01-02 VITALS — BP 118/70 | HR 74 | Temp 98.1°F | Ht 67.0 in | Wt 286.0 lb

## 2018-01-02 DIAGNOSIS — G43009 Migraine without aura, not intractable, without status migrainosus: Secondary | ICD-10-CM

## 2018-01-02 DIAGNOSIS — Z23 Encounter for immunization: Secondary | ICD-10-CM

## 2018-01-02 DIAGNOSIS — R358 Other polyuria: Secondary | ICD-10-CM

## 2018-01-02 DIAGNOSIS — R3589 Other polyuria: Secondary | ICD-10-CM

## 2018-01-02 DIAGNOSIS — E282 Polycystic ovarian syndrome: Secondary | ICD-10-CM

## 2018-01-02 LAB — POCT GLYCOSYLATED HEMOGLOBIN (HGB A1C): HEMOGLOBIN A1C: 4.9 % (ref 4.0–5.6)

## 2018-01-02 MED ORDER — POLYETHYLENE GLYCOL 3350 17 GM/SCOOP PO POWD: 17.0000 g | Freq: Two times a day (BID) | ORAL | 1 refills | Status: DC | PRN

## 2018-01-02 MED ORDER — IBUPROFEN 800 MG PO TABS: 800.0000 mg | ORAL_TABLET | Freq: Three times a day (TID) | ORAL | 0 refills | Status: DC | PRN

## 2018-01-02 NOTE — Progress Notes (Signed)
Subjective:    Patient ID: Mackenzie Gibson, female    DOB: February 08, 1986, 32 y.o.   MRN: [id number]   CC: Obesity, PCOS, mood swings  HPI: Obesity: Patient reports it is been very hard for her to lose weight.  She attributes this to her PCOS.  Patient reports that she has cut out all sweets and is eating a low-carb diet.  She reports that she has made white things sweet things.  She also reports that she eats little sugars.  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  Reports that she gets at least 6000 steps in per day.  Patient is very frustrated by her inability to lose weight and is interested in talking with Dr. Jenne Campus about this.  PCOS: Patient is very concerned that she has insulin resistance.  Patient worried about everything that entails her PCOS.  Patient reports that she is now having periods at 2-day cycles.  Patient has a history of tubal ligation.  Patient would like to have her A1c checked to ensure that she does not have diabetes and needs to be on metformin.  Constipation: Patient reports that last week she had a regular bowel movement however she goes many many days between having bowel movements.  Patient reports that she has often to strain when she is having a bowel movement.  Patient reports that she has not really tried anything in order to help with this.  Patient reports having an EGD in the past that was normal.  Patient reports that she does have diarrhea at times.  Patient denies any blood in her stool.  Patient denies abdominal pain.  Migraines: Patient has been seen previously for her migraines.  Patient reports that she has tried Tylenol which does not help but 800 ibuprofen seems to help.  Patient reports that she drinks coffee every day.  Reports that she is sensitive to light when she has a headache but not to sound.  Patient reports nausea and occasional vomiting with her headaches.  Patient reports that she has no change in vision, no  tearing.  Patient denies any focal neurological findings.  Patient reports that the pain starts in the front of her head and radiates toward her back.  It can sometimes last all day.  Patient reports that her headaches can also be worse with standing.  She has these headaches about 2 times per week.  Smoking status reviewed-smokes 1 pack/day and is interested in cutting back  ROS: 10 point ROS is otherwise negative, except as mentioned in HPI  Patient Active Problem List   Diagnosis Date Noted  . Migraine 12/05/2017  . Heavy menstrual bleeding 10/21/2015  . S/P tubal ligation 03/03/2015  . Asthmatic bronchitis , chronic ([place]) 02/09/2015  . Obesity, morbid ([place])   . Bipolar disorder ([place]) 01/12/2014  . Chronic pelvic pain in female 01/12/2014  . Eczema, dyshidrotic 12/20/2013  . TOBACCO USER 03/06/2009  . SOMATIZATION DISORDER 07/27/2008  . PANIC DISORDER 10/20/2006  . POLYCYSTIC OVARIAN DISEASE 03/04/2004     Objective:  BP 118/70   Pulse 74   Temp 98.1 F (36.7 C) (Oral)   Ht 5\' 7"  (1.702 m)   Wt 286 lb (129.7 kg)   SpO2 99%   BMI 44.79 kg/m  Vitals and nursing note reviewed  General: NAD, pleasant Cardiac: RRR, normal heart sounds, no murmurs Respiratory: CTAB, normal effort Abdomen: soft, tender in left lower quadrant and epigastrium, nondistended, no masses or  hernias felt Extremities: no edema or cyanosis. WWP. Skin: warm and dry, no rashes noted Neuro: alert and oriented, no focal deficits, CN II-XII grossly intact Psych: normal affect, tangential speech, fast speech  Assessment & Plan:    POLYCYSTIC OVARIAN DISEASE Patient appears to be stable at this time but will obtain A1c per patient request in order to determine if patient has developed type 2 diabetes.  Patient seems quite worried about this prognosis and is often worried about many of the things going on with her we will schedule her for top see clinic in order to spend more time with her  Obesity,  morbid ([place]) Patient counseled on proper diet and exercise regimens.  Patient given our nutritionist information and she is to call Dr. Jenne Campus in order to schedule an appointment.  Migraine Given ibuprofen 800 is helping patient and she rarely takes this will refill her ibuprofen and discuss these headaches at autopsy clinic on 11/11.  Patient agreeable to this plan.  Given strict return precautions and discussed red flag symptoms with patient.  Constipation: Patient to take MiraLAX as needed for her constipation.  No red flag symptoms at this time.  Patient with no family history of colon cancer and does not require colonoscopy screening.   [name] Noorah Giammona, DO Family Medicine Resident PGY-2

## 2018-01-02 NOTE — Patient Instructions (Signed)
Thank you for coming to see me today. It was a pleasure! Today we talked about:   For your constipation please try MiraLAX daily as needed in order to have soft stools. For your migraines please continue taking the ibuprofen 800 mg as needed. Please continue to try to stop smoking. We will call you with your lab results. I have given you a phone number for Dr. Jenne Campus please call her to schedule an appointment for nutrition follow-up.  Please follow-up with me on November 11 at 3 PM or sooner as needed.  If you have any questions or concerns, please do not hesitate to call the office at [phone].  Take Care,   [name] Ryelle Ruvalcaba, DO

## 2018-01-07 NOTE — Assessment & Plan Note (Addendum)
Patient appears to be stable at this time but will obtain A1c per patient request in order to determine if patient has developed type 2 diabetes.  Patient seems quite worried about this prognosis and is often worried about many of the things going on with her we will schedule her for top see clinic in order to spend more time with her

## 2018-01-07 NOTE — Assessment & Plan Note (Signed)
Patient counseled on proper diet and exercise regimens.  Patient given our nutritionist information and she is to call Dr. Jenne Campus in order to schedule an appointment.

## 2018-01-07 NOTE — Assessment & Plan Note (Signed)
>>  ASSESSMENT AND PLAN FOR POLYCYSTIC OVARIAN DISEASE WRITTEN ON 01/07/2018  6:36 PM BY SHIRLEY, [name], DO  Patient appears to be stable at this time but will obtain A1c per patient request in order to determine if patient has developed type 2 diabetes.  Patient seems quite worried about this prognosis and is often worried about many of the things going on with her we will schedule her for top see clinic in order to spend more time with her

## 2018-01-07 NOTE — Assessment & Plan Note (Signed)
Given ibuprofen 800 is helping patient and she rarely takes this will refill her ibuprofen and discuss these headaches at autopsy clinic on 11/11.  Patient agreeable to this plan.  Given strict return precautions and discussed red flag symptoms with patient.

## 2018-01-12 ENCOUNTER — Ambulatory Visit (INDEPENDENT_AMBULATORY_CARE_PROVIDER_SITE_OTHER): Payer: Self-pay | Admitting: Family Medicine

## 2018-01-12 VITALS — BP 122/80 | HR 89 | Temp 97.8°F | Ht 67.0 in | Wt 285.8 lb

## 2018-01-12 DIAGNOSIS — N898 Other specified noninflammatory disorders of vagina: Secondary | ICD-10-CM

## 2018-01-12 DIAGNOSIS — G43009 Migraine without aura, not intractable, without status migrainosus: Secondary | ICD-10-CM

## 2018-01-12 DIAGNOSIS — E282 Polycystic ovarian syndrome: Secondary | ICD-10-CM

## 2018-01-12 DIAGNOSIS — F3161 Bipolar disorder, current episode mixed, mild: Secondary | ICD-10-CM

## 2018-01-12 LAB — POCT WET PREP (WET MOUNT)
Clue Cells Wet Prep Whiff POC: POSITIVE
Trichomonas Wet Prep HPF POC: ABSENT

## 2018-01-12 MED ORDER — METFORMIN HCL 500 MG PO TABS: 500.0000 mg | ORAL_TABLET | Freq: Every day | ORAL | 3 refills | Status: DC

## 2018-01-12 MED ORDER — POLYETHYLENE GLYCOL 3350 17 GM/SCOOP PO POWD: 17.0000 g | Freq: Two times a day (BID) | ORAL | 1 refills | Status: DC | PRN

## 2018-01-12 MED ORDER — IBUPROFEN 800 MG PO TABS: 800.0000 mg | ORAL_TABLET | Freq: Three times a day (TID) | ORAL | 0 refills | Status: DC | PRN

## 2018-01-12 NOTE — Patient Instructions (Addendum)
Thank you for coming to see me today. It was a pleasure! Today we talked about:   For your migraines.  Please keep ibuprofen on hand, and take it as sooner as your headache starts.  Please make a journal that includes: Wake/ Sleep, Food and drink, Activities or events (e.g. weather, work, social, bowel movement, menstrual cycle).   For your PCOS, you may start metformin at 500 mg daily to help with some of your symptoms.   For your weight loss, goals would be to walk >10,000 steps per day and try to continue a healthy diet.   We will let you know your results and send you medications if you require any.   Please follow up with your therapist regarding your therapist.   Please schedule an appointment for you pap smear.   If you have any questions or concerns, please do not hesitate to call the office at [phone].  Take Care,   [name] Philis Doke, DO

## 2018-01-12 NOTE — Progress Notes (Signed)
Subjective:    Patient ID: Mackenzie Gibson, female    DOB: [birth year], 32 y.o.   MRN: [id number]   CC: PCOS, migraines,   HPI: PCOS: Patient with acne and inability to lose weight, she would like to start metformin and cannot believe that her A1c was only 4.9 last time. She believes this is what is causing all of her other issues and wants to get her PCOS under better control  Migraines: Patient reports that her migraines have improved but she is still having them frequently. Reports taking ibuprofen 600mg  has helped. She reports nausea with no vomiting. Some fluttering in her chest, Has photophobia and phonophobia. Reports some floaters. Slow onset of headache. Never tried imitrex.  ROS: No chest pain, SOB, weakness  Weight loss: Patient reports that this is one f her major concerns as she eats very little, also reports sleeping irregularly and not exercising regularly  H/o Bipolar: Patient not interested in taking any medications, follow with a therapist who will tell her when she needs to start medications. She does not believe that any of her psych issues are attributing to her headaches or her weight, however notes issues sleeping  Vaginal Discharge - has been ongoing for a few days  - Denies itching burning, abdominal pain, or vomiting - Discharge described as odorous.  - Denies burning with urination, no hematuria.  - Patient reports having BV in the past.  - Patient denies douching.  - Would like to self-swab  Smoking status reviewed- not interested in quitting  ROS: 10 point ROS is otherwise negative, except as mentioned in HPI  Patient Active Problem List   Diagnosis Date Noted  . Vaginal discharge 01/20/2018  . Migraine 12/05/2017  . Heavy menstrual bleeding 10/21/2015  . S/P tubal ligation 03/03/2015  . Asthmatic bronchitis , chronic ([place]) 02/09/2015  . Obesity, morbid ([place])   . Bipolar disorder ([place]) 01/12/2014  . Chronic pelvic pain in female 01/12/2014  .  Eczema, dyshidrotic 12/20/2013  . TOBACCO USER 03/06/2009  . SOMATIZATION DISORDER 07/27/2008  . PANIC DISORDER 10/20/2006  . POLYCYSTIC OVARIAN DISEASE 03/04/2004     Objective:  BP 122/80 (BP Location: Left Arm, Patient Position: Sitting, Cuff Size: Large)   Pulse 89   Temp 97.8 F (36.6 C) (Oral)   Ht 5\' 7"  (1.702 m)   Wt 285 lb 12.8 oz (129.6 kg)   SpO2 98%   BMI 44.76 kg/m  Vitals and nursing note reviewed  General: NAD, pleasant Head: Atraumatic Neck: supple, no cervical LAD Cardiac: RRR, normal heart sounds, no murmurs Respiratory: CTAB, normal effort Extremities: no edema or cyanosis. WWP. Skin: warm and dry, no rashes noted Neuro: alert and oriented, no focal deficits, Cn II-XII intact Psych: normal affect, pressured speech, tangential thought  Assessment & Plan:    Bipolar disorder ([place]) Patient not open to starting mood stabilizers, but long discussion that her psych concerns are likely exacerbating her other concerns. She is seeing a therapist and will reach out to me when she is interested in medications.   POLYCYSTIC OVARIAN DISEASE start metformin at 500 mg daily  Vaginal discharge Patient with BV on self-swab, will treat with flagyl 500 BID for 7 days. Educated to not use alcohol while on treatment.   Migraine Patient to keep ibuprofen on hand for when she has a headache. She is to keep a journal to document association with sleep, etc...  Patient's migraines are overall improving.   Obesity, morbid ([place]) Will start metformin 500mg   daily and encouraged to schedule appointment with our nutritionist.     [name] Bryon Parker, [facility] Resident PGY-2

## 2018-01-13 ENCOUNTER — Telehealth: Payer: Self-pay | Admitting: Family Medicine

## 2018-01-13 MED ORDER — METRONIDAZOLE 500 MG PO TABS: 500.0000 mg | ORAL_TABLET | Freq: Two times a day (BID) | ORAL | 0 refills | Status: AC

## 2018-01-13 NOTE — Telephone Encounter (Signed)
Patient aware. Alisa Brake, RN ([facility] RN)  

## 2018-01-13 NOTE — Telephone Encounter (Signed)
Attempted to call patient with results showing BV, but voicemail is full. Sent metronidazole to pharmacy for patient to complete.   [name] Yocelin Vanlue, DO PGY-2, [facility]

## 2018-01-20 DIAGNOSIS — N898 Other specified noninflammatory disorders of vagina: Secondary | ICD-10-CM | POA: Insufficient documentation

## 2018-01-20 NOTE — Assessment & Plan Note (Signed)
Patient not open to starting mood stabilizers, but long discussion that her psych concerns are likely exacerbating her other concerns. She is seeing a therapist and will reach out to me when she is interested in medications.

## 2018-01-20 NOTE — Assessment & Plan Note (Signed)
Patient to keep ibuprofen on hand for when she has a headache. She is to keep a journal to document association with sleep, etc...  Patient's migraines are overall improving.

## 2018-01-20 NOTE — Assessment & Plan Note (Signed)
>>  ASSESSMENT AND PLAN FOR POLYCYSTIC OVARIAN DISEASE WRITTEN ON 01/20/2018 10:22 AM BY SHIRLEY, [name], DO  start metformin at 500 mg daily

## 2018-01-20 NOTE — Assessment & Plan Note (Signed)
Patient with BV on self-swab, will treat with flagyl 500 BID for 7 days. Educated to not use alcohol while on treatment.

## 2018-01-20 NOTE — Assessment & Plan Note (Signed)
start metformin at 500 mg daily

## 2018-01-20 NOTE — Assessment & Plan Note (Signed)
Will start metformin 500mg  daily and encouraged to schedule appointment with our nutritionist.

## 2018-04-01 ENCOUNTER — Telehealth: Payer: Self-pay

## 2018-04-01 NOTE — Telephone Encounter (Signed)
Patient called. She is having nausea/vomiting since last night. Unable to keep anything down. Took a friend's Zofran which helped her symptoms.  Requests Rx for Zofran.   Please call her if sent. [phone]  Danley Danker, RN [facility] RN)

## 2018-04-03 MED ORDER — ONDANSETRON HCL 4 MG PO TABS: 4.0000 mg | ORAL_TABLET | Freq: Three times a day (TID) | ORAL | 0 refills | Status: DC | PRN

## 2018-04-03 NOTE — Telephone Encounter (Signed)
I have sent in patient's zofran but am unable to reach by phone. Please let her know it is sent in.   Thank you

## 2018-04-21 ENCOUNTER — Other Ambulatory Visit (HOSPITAL_COMMUNITY)
Admission: RE | Admit: 2018-04-21 | Discharge: 2018-04-21 | Disposition: A | Discharge status: Home / Self Care | Payer: Medicaid Other | Source: Ambulatory Visit | Attending: Family Medicine | Admitting: Family Medicine

## 2018-04-21 ENCOUNTER — Ambulatory Visit (INDEPENDENT_AMBULATORY_CARE_PROVIDER_SITE_OTHER): Payer: Medicaid Other | Admitting: Family Medicine

## 2018-04-21 ENCOUNTER — Encounter: Payer: Self-pay | Admitting: Family Medicine

## 2018-04-21 ENCOUNTER — Other Ambulatory Visit: Payer: Self-pay

## 2018-04-21 VITALS — BP 112/80 | HR 74 | Temp 98.1°F | Ht 67.0 in | Wt 281.0 lb

## 2018-04-21 DIAGNOSIS — Z113 Encounter for screening for infections with a predominantly sexual mode of transmission: Secondary | ICD-10-CM | POA: Diagnosis not present

## 2018-04-21 DIAGNOSIS — N920 Excessive and frequent menstruation with regular cycle: Secondary | ICD-10-CM

## 2018-04-21 DIAGNOSIS — Z0000 Encounter for general adult medical examination without abnormal findings: Secondary | ICD-10-CM | POA: Diagnosis not present

## 2018-04-21 DIAGNOSIS — Z01419 Encounter for gynecological examination (general) (routine) without abnormal findings: Secondary | ICD-10-CM | POA: Diagnosis not present

## 2018-04-21 DIAGNOSIS — R1031 Right lower quadrant pain: Secondary | ICD-10-CM

## 2018-04-21 DIAGNOSIS — G8929 Other chronic pain: Secondary | ICD-10-CM | POA: Diagnosis not present

## 2018-04-21 DIAGNOSIS — L732 Hidradenitis suppurativa: Secondary | ICD-10-CM

## 2018-04-21 DIAGNOSIS — Z124 Encounter for screening for malignant neoplasm of cervix: Secondary | ICD-10-CM | POA: Insufficient documentation

## 2018-04-21 MED ORDER — CLINDAMYCIN PHOSPHATE 1 % EX LOTN: TOPICAL_LOTION | Freq: Two times a day (BID) | CUTANEOUS | 0 refills | Status: DC

## 2018-04-21 NOTE — Progress Notes (Signed)
Subjective:   Mackenzie Gibson is a 33 y.o. female with a history of obesity, PCOS, bipolar disorder, and migraines here for an annual gynecological exam.   Gyn concerns/Preventative healthcare  Last menstrual period: No LMP recorded. (Menstrual status: Other).  Regular periods: yes every 30 days  Heavy bleeding: some with clots, went through 12 pads 1 day last month- got lightheaded and dizzy has only happened 1 time, 3 days  Sexually active: yes  Contraception or hormonal therapy: tubal ligation  Hx of STD: Patient does desire STD screening  Dyspareunia: Yes in RLQ, denies any bleeding after intercourse  Hot flashes: No  Vaginal discharge: no  Dysuria:No  Last mammogram: n/a  Breast mass or concerns: No  Last pap smear: 07/2014   Patient requesting testing for hepatitis C (although negative in 2018, counseled patient on not needing this screen however patient insisted), RPR and HIV.  Will obtain lipid panel for regular screening given patient's history of obesity.  RLQ pain Patient states that she has been having right lower quadrant abdominal pain for 3 weeks now associated with diarrhea 5 times per day.  Patient denies any blood in her stools or dark tarry stools.  Patient states that she has had this happen before however it is never lasted this long.  Patient states that she had a GI bug with nausea and vomiting 4 weeks ago however she had gotten better and then this happened.  States that she did not make an appointment to come in because she was already coming in to see me for her Pap smear.  Patient does not know anything that makes it better or worse.  Does not associate this with eating.  Denies any nausea, vomiting.   PMH, Surgical Hx, Family Hx, Social History reviewed and updated as below.  Past Medical History:  Diagnosis Date  . Anxiety   . Bipolar disorder ([place])   . Bipolar disorder ([place])   . 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  Depression   . PCOS (polycystic ovarian syndrome)   . Pregnancy induced hypertension    Past Surgical History:  Procedure Laterality Date  . CHOLECYSTECTOMY    . EYE SURGERY    . TUBAL LIGATION Bilateral 03/03/2015   Procedure: POST PARTUM TUBAL LIGATION;  Surgeon: Guss Bunde, MD;  Location: [facility];  Service: Gynecology;  Laterality: Bilateral;   Family History  Problem Relation Age of Onset  . Depression Mother   . Bipolar disorder Mother   . Rheum arthritis Mother   . Cervical cancer Mother   . Depression Father   . High blood pressure Father   . Bipolar disorder Maternal Aunt   . Cervical cancer Sister   . Cervical cancer Maternal Grandmother    Social History   Socioeconomic History  . Marital status: Divorced    Spouse name: Not on file  . Number of children: Not on file  . Years of education: Not on file  . Highest education level: Not on file  Occupational History  . Not on file  Social Needs  . Financial resource strain: Not on file  . Food insecurity:    Worry: Not on file    Inability: Not on file  . Transportation needs:    Medical: Not on file    Non-medical: Not on file  Tobacco Use  . Smoking status: Current Every Day Smoker    Packs/day: 1.00    Types: Cigarettes  . Smokeless tobacco: Never Used  Substance and Sexual Activity  . Alcohol use: No  . Drug use: No  . Sexual activity: Yes    Birth control/protection: None  Lifestyle  . Physical activity:    Days per week: Not on file    Minutes per session: Not on file  . Stress: Not on file  Relationships  . Social connections:    Talks on phone: Not on file    Gets together: Not on file    Attends religious service: Not on file    Active member of club or organization: Not on file    Attends meetings of clubs or organizations: Not on file    Relationship status: Not on file  Other Topics Concern  . Not on file  Social History Narrative  . Not on file   Review of Systems:  Per HPI.  Otherwise a complete 10 point ROS was negative.    Objective:   Vitals:   04/21/18 1408  BP: 112/80  Pulse: 74  Temp: 98.1 F (36.7 C)  SpO2: 98%   Exam: General: well appearing, NAD. HEENT: NCAT. Cardiovascular: RRR. No murmurs, rubs, or gallops. Respiratory: CTAB. No rales, rhonchi, or wheeze. Abdomen: soft, tender in RLQ, nondistended. Extremities: warm, well perfused. No LE edema. Skin: Warm, dry. Multiple pustular areas noted on lower abdomen and in pelvic area with no induration or fluctuance noted, no overlying appearance of cellutlitis Neuro: No focal deficits. Psych:  Neatly groomed and appropriately dressed. Unable to maintain good eye contact. Speech is rushed and tangential. Normal affect.  Pelvic Exam:        External: normal female genitalia without lesions or masses        Vagina: normal without lesions or masses        Cervix: normal without lesions or masses        Pap smear: performed        Samples for Wet prep, GC/Chlamydia obtained    Chemistry      Component Value Date/Time   NA 143 04/21/2018 1504   K 3.9 04/21/2018 1504   CL 103 04/21/2018 1504   CO2 25 04/21/2018 1504   BUN 10 04/21/2018 1504   CREATININE 0.84 04/21/2018 1504   CREATININE 0.66 05/15/2015 1019      Component Value Date/Time   CALCIUM 9.3 04/21/2018 1504   ALKPHOS 60 04/21/2018 1504   AST 11 04/21/2018 1504   ALT 20 04/21/2018 1504   BILITOT <0.2 04/21/2018 1504      Lab Results  Component Value Date   WBC 11.6 (H) 04/21/2018   HGB 14.2 04/21/2018   HCT 43.4 04/21/2018   MCV 85 04/21/2018   PLT 230 04/21/2018   Lab Results  Component Value Date   TSH 2.440 04/21/2017   Lab Results  Component Value Date   HGBA1C 4.9 01/02/2018   Assessment & Plan:    Right lower quadrant abdominal pain Patient with RLQ pain on exam.  Does report that she has had multiple episodes of nonbloody diarrhea for the past 3 weeks.  Will obtain CMP, CBC in order to determine other  etiologies of her pain.  No mass noted on bimanual exam.  Patient is quite tender on exam however.  Encourage patient to use fiber and will follow-up on 2/27 when she comes in for her colposcopy. -Patient does have history of having chronic pelvic pain which was thought to be related to IBS at previous visits.  Heavy menstrual bleeding Reports that this is only  occurred once and patient is no longer on her period. - Will obtain Pap smear today. -Patient denies any lightheadedness, dizziness.  However is requesting hemoglobin to be checked.  Obtain CBC.  Hidradenitis suppurativa Hidradenitis suppuativa noted on patient's pelvic area during pelvic exam.  Patient states that it does often bother her and she uses warm compresses however there becoming more frequent.   Patient counseled on improvement that will occur with smoking cessation, weight loss, and given prescription for clindamycin 1% to apply twice daily.   [name] Corneshia Hines, DO Family Medicine Resident PGY-2

## 2018-04-21 NOTE — Patient Instructions (Signed)
Thank you for coming to see me today. It was a pleasure! Today we talked about:   We will call you with all of your lab results.  Set up my chart if you would like to view them on there.  Based on your lab results we may consider imaging afterwards in order to determine the etiology of your right lower quadrant pain.  I will refill your medications.  Please follow-up with me in 6 months or sooner as needed.  If you have any questions or concerns, please do not hesitate to call the office at [phone].  Take Care,   [name] Ramir Malerba, DO

## 2018-04-22 LAB — BASIC METABOLIC PANEL
BUN / CREAT RATIO: 12 (ref 9–23)
BUN: 10 mg/dL (ref 6–20)
CO2: 25 mmol/L (ref 20–29)
CREATININE: 0.84 mg/dL (ref 0.57–1.00)
Calcium: 9.3 mg/dL (ref 8.7–10.2)
Chloride: 103 mmol/L (ref 96–106)
GFR calc Af Amer: 106 mL/min/{1.73_m2} (ref >59)
GFR, EST NON AFRICAN AMERICAN: 92 mL/min/{1.73_m2} (ref >59)
GLUCOSE: 88 mg/dL (ref 65–99)
POTASSIUM: 3.9 mmol/L (ref 3.5–5.2)
SODIUM: 143 mmol/L (ref 134–144)

## 2018-04-22 LAB — HEPATIC FUNCTION PANEL
ALK PHOS: 60 IU/L (ref 39–117)
ALT: 20 IU/L (ref 0–32)
AST: 11 IU/L (ref 0–40)
Albumin: 4.3 g/dL (ref 3.8–4.8)
Bilirubin Total: <0.2 mg/dL (ref 0.0–1.2)
Bilirubin, Direct: 0.07 mg/dL (ref 0.00–0.40)
Total Protein: 6.9 g/dL (ref 6.0–8.5)

## 2018-04-22 LAB — LIPID PANEL
CHOL/HDL RATIO: 3.9 ratio (ref 0.0–4.4)
CHOLESTEROL TOTAL: 110 mg/dL (ref 100–199)
HDL: 28 mg/dL — ABNORMAL LOW (ref >39)
LDL CALC: 46 mg/dL (ref 0–99)
Triglycerides: 178 mg/dL — ABNORMAL HIGH (ref 0–149)
VLDL CHOLESTEROL CAL: 36 mg/dL (ref 5–40)

## 2018-04-22 LAB — CBC
Hematocrit: 43.4 % (ref 34.0–46.6)
Hemoglobin: 14.2 g/dL (ref 11.1–15.9)
MCH: 27.9 pg (ref 26.6–33.0)
MCHC: 32.7 g/dL (ref 31.5–35.7)
MCV: 85 fL (ref 79–97)
PLATELETS: 230 10*3/uL (ref 150–450)
RBC: 5.09 x10E6/uL (ref 3.77–5.28)
RDW: 13.6 % (ref 11.7–15.4)
WBC: 11.6 10*3/uL — ABNORMAL HIGH (ref 3.4–10.8)

## 2018-04-22 LAB — HIV ANTIBODY (ROUTINE TESTING W REFLEX): HIV SCREEN 4TH GENERATION: NONREACTIVE

## 2018-04-22 LAB — RPR: RPR: NONREACTIVE

## 2018-04-22 LAB — BETA HCG QUANT (REF LAB)

## 2018-04-22 LAB — HEPATITIS C ANTIBODY: Hep C Virus Ab: <0.1 s/co ratio (ref 0.0–0.9)

## 2018-04-23 DIAGNOSIS — R87810 Cervical high risk human papillomavirus (HPV) DNA test positive: Secondary | ICD-10-CM

## 2018-04-23 DIAGNOSIS — R87612 Low grade squamous intraepithelial lesion on cytologic smear of cervix (LGSIL): Secondary | ICD-10-CM | POA: Insufficient documentation

## 2018-04-23 DIAGNOSIS — L732 Hidradenitis suppurativa: Secondary | ICD-10-CM | POA: Insufficient documentation

## 2018-04-23 DIAGNOSIS — R1031 Right lower quadrant pain: Secondary | ICD-10-CM | POA: Insufficient documentation

## 2018-04-23 DIAGNOSIS — R87610 Atypical squamous cells of undetermined significance on cytologic smear of cervix (ASC-US): Secondary | ICD-10-CM | POA: Insufficient documentation

## 2018-04-23 LAB — CYTOLOGY - PAP
Chlamydia: NEGATIVE
Diagnosis: UNDETERMINED — AB
HPV: DETECTED — AB
Neisseria Gonorrhea: NEGATIVE
Trichomonas: NEGATIVE

## 2018-04-23 NOTE — Assessment & Plan Note (Addendum)
Patient with RLQ pain on exam.  Does report that she has had multiple episodes of nonbloody diarrhea for the past 3 weeks.  Will obtain CMP, CBC in order to determine other etiologies of her pain.  No mass noted on bimanual exam.  Patient is quite tender on exam however.  Encourage patient to use fiber and will follow-up on 2/27 when she comes in for her colposcopy. -Patient does have history of having chronic pelvic pain which was thought to be related to IBS at previous visits.

## 2018-04-23 NOTE — Progress Notes (Signed)
Called and spoke with patient over phone regarding her results of her Pap smear.  She is now scheduled for a colposcopy on 04/30/2018 at 11 AM in our clinic for recommendations given HPV positive and ASCUS.  Patient does also have a family history of cervical cancer.

## 2018-04-23 NOTE — Assessment & Plan Note (Addendum)
Hidradenitis suppuativa noted on patient's pelvic area during pelvic exam.  Patient states that it does often bother her and she uses warm compresses however there becoming more frequent.   Patient counseled on improvement that will occur with smoking cessation, weight loss, and given prescription for clindamycin 1% to apply twice daily.

## 2018-04-23 NOTE — Assessment & Plan Note (Signed)
Reports that this is only occurred once and patient is no longer on her period. - Will obtain Pap smear today. -Patient denies any lightheadedness, dizziness.  However is requesting hemoglobin to be checked.  Obtain CBC.

## 2018-04-24 ENCOUNTER — Encounter: Payer: Self-pay | Admitting: Family Medicine

## 2018-04-27 LAB — CERVICOVAGINAL ANCILLARY ONLY: Herpes: NEGATIVE

## 2018-04-30 ENCOUNTER — Ambulatory Visit (INDEPENDENT_AMBULATORY_CARE_PROVIDER_SITE_OTHER): Payer: Self-pay | Admitting: Family Medicine

## 2018-04-30 ENCOUNTER — Other Ambulatory Visit: Payer: Self-pay

## 2018-04-30 VITALS — BP 120/82 | HR 85 | Temp 97.5°F | Wt 280.0 lb

## 2018-04-30 DIAGNOSIS — R87610 Atypical squamous cells of undetermined significance on cytologic smear of cervix (ASC-US): Secondary | ICD-10-CM

## 2018-04-30 DIAGNOSIS — R87810 Cervical high risk human papillomavirus (HPV) DNA test positive: Secondary | ICD-10-CM

## 2018-04-30 DIAGNOSIS — Z3046 Encounter for surveillance of implantable subdermal contraceptive: Secondary | ICD-10-CM

## 2018-04-30 MED ORDER — ETONOGESTREL 68 MG ~~LOC~~ IMPL: 68.0000 mg | DRUG_IMPLANT | Freq: Once | SUBCUTANEOUS | Status: DC

## 2018-04-30 NOTE — Progress Notes (Signed)
Pap hx: Normal in 2015 and 2016. 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  Placed in lithotomy position. Cervix viewed with speculum and colposcope after application of acetic acid.   Colposcopy adequate (entire squamocolumnar junctions seen  in entirety) ?  Yes, easily Acetowhite lesions?  None Punctation?  None Mosaicism?  None Abnormal vasculature?  None Biopsies?  None ECC?  No Complications?  None  COMMENTS:  Patient was given post procedure instructions.Pap in one year

## 2018-05-06 ENCOUNTER — Encounter: Payer: Self-pay | Admitting: Family Medicine

## 2018-05-08 ENCOUNTER — Encounter: Payer: Self-pay | Admitting: Family Medicine

## 2018-05-17 ENCOUNTER — Telehealth: Payer: Self-pay | Admitting: Family Medicine

## 2018-05-17 DIAGNOSIS — J45909 Unspecified asthma, uncomplicated: Secondary | ICD-10-CM

## 2018-05-17 DIAGNOSIS — O99519 Diseases of the respiratory system complicating pregnancy, unspecified trimester: Principal | ICD-10-CM

## 2018-05-17 MED ORDER — ALBUTEROL SULFATE HFA 108 (90 BASE) MCG/ACT IN AERS: 1.0000 | INHALATION_SPRAY | Freq: Four times a day (QID) | RESPIRATORY_TRACT | 5 refills | Status: DC | PRN

## 2018-05-17 NOTE — Telephone Encounter (Signed)
Called patient given symptoms reported on intake note.  Patient reports 5 days of fevers as high as 102.0 Fahrenheit.  She also endorses dyspnea and a productive cough.  She reports this feels like a typical bout of her bronchitis triggered by this season and with the exception that her temperature spikes at night. She reports this hit her like a ton of bricks with myalgias and body aches suddenly on Wednesday.   She has a pulse oximeter on her cell phone which is been reading 95%.  She feels her breathing is worse at night.  She does not have an inhaler at home as she cannot afford this.  The patient currently works at a group home.  She reports she presented to the nurse at the employee clinic earlier this week and will send back to work.  She was afebrile at the time.  She has been taking ibuprofen intermittently.  The patient reports she got an influenza shot earlier this year.  She denies vomiting, dyspnea at rest, low pulse oximetry readings at the nurse's office or difficulty breathing while she is working.  She does endorse wheezing.  She continues to smoke.  The patient has several children at home, and they had influenza in December.  Discussed case with Dr. Nori Riis.  Dr. Nori Riis recommends the patient be seen either in her car or under precautions.  I discussed with the patient.  I recommended that the patient be seen in her car tomorrow.  I will plan to go out to see the patient in her car at the time of her appointment. Will obtain influenza swab and triage from that point onward for need for additional testing for [place].   New prescription for albuterol sent to pharmacy, patient recommended to go to good [employer] app to download coupon.  Refill sent on this inhaler.  I gave the patient strict return precautions.  Patient reports that her boss would not let her go home from work until she is evaluated by physician.  Obtained permission from the patient to call her boss.  Called the patient's boss,Tina  McNeil. No specific medical information provided to Ms. McNeil. I specifically recommended the patient be allowed to go home ASAP.   Dorris Singh, MD  Family Medicine Teaching Service

## 2018-05-18 ENCOUNTER — Other Ambulatory Visit: Payer: Self-pay | Admitting: Family Medicine

## 2018-05-18 ENCOUNTER — Other Ambulatory Visit: Payer: Self-pay

## 2018-05-18 ENCOUNTER — Encounter: Payer: Self-pay | Admitting: Family Medicine

## 2018-05-18 ENCOUNTER — Ambulatory Visit (INDEPENDENT_AMBULATORY_CARE_PROVIDER_SITE_OTHER): Payer: Self-pay | Admitting: Family Medicine

## 2018-05-18 VITALS — HR 83 | Temp 98.1°F | Resp 14

## 2018-05-18 DIAGNOSIS — R6889 Other general symptoms and signs: Secondary | ICD-10-CM

## 2018-05-18 DIAGNOSIS — J441 Chronic obstructive pulmonary disease with (acute) exacerbation: Secondary | ICD-10-CM

## 2018-05-18 LAB — RESPIRATORY PANEL BY PCR
ADENOVIRUS-RVPPCR: NOT DETECTED
Bordetella pertussis: NOT DETECTED
CORONAVIRUS 229E-RVPPCR: NOT DETECTED
CORONAVIRUS HKU1-RVPPCR: NOT DETECTED
CORONAVIRUS NL63-RVPPCR: NOT DETECTED
Chlamydophila pneumoniae: NOT DETECTED
Coronavirus OC43: NOT DETECTED
Influenza A: NOT DETECTED
Influenza B: NOT DETECTED
METAPNEUMOVIRUS-RVPPCR: NOT DETECTED
Mycoplasma pneumoniae: NOT DETECTED
PARAINFLUENZA VIRUS 2-RVPPCR: NOT DETECTED
PARAINFLUENZA VIRUS 3-RVPPCR: NOT DETECTED
Parainfluenza Virus 1: NOT DETECTED
Parainfluenza Virus 4: NOT DETECTED
RHINOVIRUS / ENTEROVIRUS - RVPPCR: DETECTED — AB
Respiratory Syncytial Virus: NOT DETECTED

## 2018-05-18 LAB — POCT INFLUENZA A/B
INFLUENZA A, POC: NEGATIVE
INFLUENZA B, POC: NEGATIVE

## 2018-05-18 MED ORDER — PREDNISONE 20 MG PO TABS: 40.0000 mg | ORAL_TABLET | Freq: Every day | ORAL | 0 refills | Status: AC

## 2018-05-18 MED ORDER — DOXYCYCLINE HYCLATE 100 MG PO TABS: 100.0000 mg | ORAL_TABLET | Freq: Two times a day (BID) | ORAL | 0 refills | Status: DC

## 2018-05-18 NOTE — Progress Notes (Signed)
  Patient Name: [name] Date of Birth: 1985-12-05 Date of Visit: 05/18/18 PCP: Shirley, [name], DO  Chief Complaint: cough, fevers   Patient examined in car.   Subjective: SHELSEY RIETH is a pleasant 33 y.o. with medical history significant for asthma, tobacco abuse, obesity, and bipolar disorder with cough and congestion. She reports she 'got hit like a truck' with muscle aches and night sweats late last week (3-4 days ago). She continued to work but developed cough, congestion, and dyspnea with significant exertion. She endorses wheezing as well. Works at [employer]. On Saturday, had a fever >100.4 oral, Sunday had a fever 102.0 F. Cough, sputum production continue. She has not yet picked up albuterol. She continues to smoke 1/2 ppd. No chest pain, nausea, vomiting, change in urine output, or dyspnea at rest. Able to perform ADLs without dyspnea--only had dyspnea with significant exertion yesterday.   ROS:  As above.  ROS  I have reviewed the patient's medical, surgical, family, and social history as appropriate.   Vitals:   05/18/18 1211  Pulse: 83  Resp: 14  Temp: 98.1 F (36.7 C)  SpO2: 99%   There were no vitals filed for this visit. HEENT: Sclera anicteric. Dentition is poor. Appears well hydrated. Neck: Supple Cardiac: Regular rate and rhythm. Normal S1/S2. No murmurs, rubs, or gallops appreciated. Lungs: Coarse bilateral breath sounds, wheezing on expiration Abdomen: Normoactive bowel sounds. No tenderness to deep or light palpation. No rebound or guarding.  Extremities: Warm, well perfused without edema.  Skin: Warm, dry Psych: Pleasant and appropriate     Diagnoses and all orders for this visit:  COPD exacerbation ([place]) -     predniSONE (DELTASONE) 20 MG tablet; Take 2 tablets (40 mg total) by mouth daily with breakfast for 5 days. -     doxycycline (VIBRA-TABS) 100 MG tablet; Take 1 tablet (100 mg total) by mouth 2 (two) times daily. -   Albuterol q4h--- Strict return precautions given, discussed smoking cessation and symptomatic treatments.  -     Novel Coronavirus, NAA ([facility])- cancelled as Rhinovirus positive.   Flu-like symptoms -     Influenza A/B- Negative -  RVP- positive, when this returned positive COVID 19 cancelled. Called patient and employer. Patient may return to work when she has been asymptomatic and afebrile X 48 hours.   Dorris Singh, MD  Family Medicine Teaching Service

## 2018-05-18 NOTE — Patient Instructions (Signed)
It was wonderful to see you today.  Thank you for choosing [facility].   Please call [phone] with any questions about today's appointment.  Please be sure to schedule follow up at the front  desk before you leave today.   Carina Brown, MD  Family Medicine    

## 2018-05-20 LAB — NOVEL CORONAVIRUS, NAA

## 2018-05-21 ENCOUNTER — Telehealth: Payer: Self-pay | Admitting: Family Medicine

## 2018-05-21 ENCOUNTER — Encounter: Payer: Self-pay | Admitting: Family Medicine

## 2018-05-21 DIAGNOSIS — J441 Chronic obstructive pulmonary disease with (acute) exacerbation: Secondary | ICD-10-CM

## 2018-05-21 MED ORDER — ALBUTEROL SULFATE HFA 108 (90 BASE) MCG/ACT IN AERS: 1.0000 | INHALATION_SPRAY | Freq: Four times a day (QID) | RESPIRATORY_TRACT | 11 refills | Status: DC | PRN

## 2018-05-21 NOTE — Telephone Encounter (Signed)
Called patient regarding MyChart message. Patient reports breathing is markedly improved. She has not started antibiotics for COPD, she has not picked up inhaler. Denies chest pain, dyspnea, hemoptysis, fevers. Patietn + Rhinovirus. Discussed options at length, reviewed return precautions. Affordable inhaler identified at [facility].  Dorris Singh, MD  Family Medicine Teaching Service

## 2018-07-01 ENCOUNTER — Ambulatory Visit (INDEPENDENT_AMBULATORY_CARE_PROVIDER_SITE_OTHER): Payer: Self-pay | Admitting: Family Medicine

## 2018-07-01 ENCOUNTER — Other Ambulatory Visit: Payer: Self-pay

## 2018-07-01 VITALS — BP 122/70 | HR 84 | Wt 273.0 lb

## 2018-07-01 DIAGNOSIS — L739 Follicular disorder, unspecified: Secondary | ICD-10-CM

## 2018-07-01 DIAGNOSIS — R1031 Right lower quadrant pain: Secondary | ICD-10-CM

## 2018-07-01 NOTE — Assessment & Plan Note (Signed)
Appears to be a chronic issue for patient and has been at least ongoing since February.  Although there is no mass noted on bimanual exam she was tender in the right adnexal area.  Ultrasound ordered but as this is not an emergent issue this will be scheduled when the need for social distancing is over.

## 2018-07-01 NOTE — Progress Notes (Signed)
    Subjective:  Mackenzie Gibson is a 33 y.o. female who presents to the [facility] today with a chief complaint of R lower abdominal pain.   HPI:  Patient is worried about her menstrual cycles because she has had longstanding right lower abdominal pain that she was told is due to her ovary.  She had a recent Pap that showed positive HPV and a negative colposcopy.  She has been told that she has PCOS but has always had regular menstrual cycles.  This month she had 2 menses.  She had 5 days of bleeding from April 13 to April 18.  It was a little bit early for her but it seemed like a regular flow.  Then she had 3 days of heavy bleeding from April 25 through April 28. Has had no nausea, vomiting.  She does have some light spotting postcoitally.  She states that sex is painful for her she feels that the pressure up against her cervix is often very uncomfortable  ROS: Per HPI   Objective:  Physical Exam: BP 122/70   Pulse 84   Wt 273 lb (123.8 kg)   LMP 06/27/2018 (Exact Date) Comment: lmp prior 4/13-18/2020  SpO2 98%   BMI 42.76 kg/m   Gen: NAD, resting comfortably GI: Normal bowel sounds present. Soft, Nontender, Nondistended. Pelvic exam: normal external genitalia, vulva, vagina, cervix, uterus. R adnexa TTP. MSK: no edema, cyanosis, or clubbing noted Skin: warm, dry.  Scattered erythematous hair follicles with boils Neuro: grossly normal, moves all extremities Psych: Normal affect and thought content   Assessment/Plan:  Right lower quadrant abdominal pain Appears to be a chronic issue for patient and has been at least ongoing since February.  Although there is no mass noted on bimanual exam she was tender in the right adnexal area.  Ultrasound ordered but as this is not an emergent issue this will be scheduled when the need for social distancing is over.  Folliculitis Recommended good hygiene and over-the-counter chlorhexidine wash.    Orders Placed This Encounter  Procedures  .  US Pelvic Complete With Transvaginal    Standing Status:   Future    Standing Expiration Date:   08/31/2019    Order Specific Question:   Reason for Exam (SYMPTOM  OR DIAGNOSIS REQUIRED)    Answer:   R adnexal pain    Order Specific Question:   Preferred imaging location?    Answer:   [id number]     J , DO PGY-3, [facility] 07/01/2018 11:06 AM

## 2018-07-01 NOTE — Assessment & Plan Note (Signed)
Recommended good hygiene and over-the-counter chlorhexidine wash.

## 2018-07-01 NOTE — Patient Instructions (Signed)
Your ultrasound cannot be scheduled until end of May or early June.  Folliculitis. Over the counter chlorhexidine wash.  Folliculitis  Folliculitis is inflammation of the hair follicles. Folliculitis most commonly occurs on the scalp, thighs, legs, back, and buttocks. However, it can occur anywhere on the body. What are the causes? This condition may be caused by:  A bacterial infection (common).  A fungal infection.  A viral infection.  Coming into contact with certain chemicals, especially oils and tars.  Shaving or waxing.  Applying greasy ointments or creams to your skin often. Long-lasting folliculitis and folliculitis that keeps coming back can be caused by bacteria that live in the nostrils. What increases the risk? This condition is more likely to develop in people with:  A weakened immune system.  Diabetes.  Obesity. What are the signs or symptoms? Symptoms of this condition include:  Redness.  Soreness.  Swelling.  Itching.  Small white or yellow, pus-filled, itchy spots (pustules) that appear over a reddened area. If there is an infection that goes deep into the follicle, these may develop into a boil (furuncle).  A group of closely packed boils (carbuncle). These tend to form in hairy, sweaty areas of the body. How is this diagnosed? This condition is diagnosed with a skin exam. To find what is causing the condition, your health care provider may take a sample of one of the pustules or boils for testing. How is this treated? This condition may be treated by:  Applying warm compresses to the affected areas.  Taking an antibiotic medicine or applying an antibiotic medicine to the skin.  Applying or bathing with an antiseptic solution.  Taking an over-the-counter medicine to help with itching.  Having a procedure to drain any pustules or boils. This may be done if a pustule or boil contains a lot of pus or fluid.  Laser hair removal. This may be  done to treat long-lasting folliculitis. Follow these instructions at home:  If directed, apply heat to the affected area as often as told by your health care provider. Use the heat source that your health care provider recommends, such as a moist heat pack or a heating pad. ? Place a towel between your skin and the heat source. ? Leave the heat on for 20-30 minutes. ? Remove the heat if your skin turns bright red. This is especially important if you are unable to feel pain, heat, or cold. You may have a greater risk of getting burned.  If you were prescribed an antibiotic medicine, use it as told by your health care provider. Do not stop using the antibiotic even if you start to feel better.  Take over-the-counter and prescription medicines only as told by your health care provider.  Do not shave irritated skin.  Keep all follow-up visits as told by your health care provider. This is important. Get help right away if:  You have more redness, swelling, or pain in the affected area.  Red streaks are spreading from the affected area.  You have a fever. This information is not intended to replace advice given to you by your health care provider. Make sure you discuss any questions you have with your health care provider. Document Released: 04/29/2001 Document Revised: 09/08/2015 Document Reviewed: 12/09/2014 Elsevier Interactive Patient Education  2019 [employer].

## 2018-07-07 ENCOUNTER — Telehealth: Payer: Self-pay | Admitting: *Deleted

## 2018-07-07 NOTE — Telephone Encounter (Signed)
Spoke with patient and she is fine with being billed.  Cost of $670 and can set up a payment plan.  Appt scheduled for 07-15-2018, patient aware.  Jazmin Hartsell,CMA

## 2018-07-07 NOTE — Telephone Encounter (Signed)
Tried to call patient but voicemail is full.  Patient has medicaid family planning and this doesn't cover the cost of the ultrasound.  Before scheduling this with [facility], I need to make sure that patient is ok with paying as self pay.  Will also send a mychart message.  Jazmin Hartsell,CMA

## 2018-07-07 NOTE — Telephone Encounter (Signed)
-----   Message from Bufford Lope, DO sent at 07/01/2018 12:00 PM EDT ----- Please schedule pelvic ultrasound in early June

## 2018-07-15 ENCOUNTER — Ambulatory Visit
Admission: RE | Admit: 2018-07-15 | Discharge: 2018-07-15 | Disposition: A | Discharge status: Home / Self Care | Payer: No Typology Code available for payment source | Source: Ambulatory Visit | Attending: Family Medicine | Admitting: Family Medicine

## 2018-07-15 ENCOUNTER — Other Ambulatory Visit: Payer: Self-pay

## 2018-07-15 DIAGNOSIS — R1031 Right lower quadrant pain: Secondary | ICD-10-CM

## 2018-07-17 ENCOUNTER — Encounter: Payer: Self-pay | Admitting: Family Medicine

## 2018-07-20 ENCOUNTER — Ambulatory Visit (INDEPENDENT_AMBULATORY_CARE_PROVIDER_SITE_OTHER): Payer: Self-pay | Admitting: Family Medicine

## 2018-07-20 ENCOUNTER — Encounter: Payer: Self-pay | Admitting: Family Medicine

## 2018-07-20 ENCOUNTER — Other Ambulatory Visit: Payer: Self-pay

## 2018-07-20 VITALS — BP 130/74 | Temp 93.0°F | Ht 67.0 in

## 2018-07-20 DIAGNOSIS — N920 Excessive and frequent menstruation with regular cycle: Secondary | ICD-10-CM

## 2018-07-20 MED ORDER — NAPROXEN 500 MG PO TABS: 500.0000 mg | ORAL_TABLET | Freq: Two times a day (BID) | ORAL | 1 refills | Status: DC

## 2018-07-20 MED ORDER — MEDROXYPROGESTERONE ACETATE 10 MG PO TABS: 10.0000 mg | ORAL_TABLET | Freq: Every day | ORAL | 0 refills | Status: DC

## 2018-07-20 NOTE — Progress Notes (Addendum)
  Subjective:   Patient ID: Mackenzie Gibson    DOB: 1985-10-23, 33 y.o. female   MRN: [id number]  Mackenzie Gibson is a 33 y.o. female with a history of migraine, PCOS, asthma, eczema, hidradenitis suppurativa, ASCUS w/ HPV, bipolar d/o, tobacco use, morbid obestiy here for   Abnormal bleeding - seen 4/29 for chronic RLQ abd pain with abnl pap smear and negative colposcopy and heavy intermenstrual bleeding. Also with dyspareunia. [name] with fibroids. - Patient states she started to have RLQ abd pain that became progressive. Then with dyspareunia.  - Had 2 periods in April, lasted 5 and 8 days respectively - with postcoital spotting - again started bleeding May 9th and hasn't yet stopped. - bleeding with large clots, soaking through multiple pads per day - pain now progressed through bilateral pelvis and lower back. Using heating pad and advil for pain which helps some. - BTL 2016, no other hormonal contraception. Desires hysterectomy. - sister had ovarian cancer. Mom, maternal aunt and grandma had cervical cancer. - endorsing off and on lightheadedness, dizziness. No LOC.  Review of Systems:  Per HPI.  [place], medications and smoking status reviewed.  Objective:   BP 130/74   Temp (!) 93 F (33.9 C)   Ht 5\' 7"  (1.702 m)   LMP 06/27/2018 (Exact Date) Comment: lmp prior 4/13-18/2020  SpO2 98%   BMI 42.76 kg/m  Vitals and nursing note reviewed.  General: obese female, in no acute distress with non-toxic appearance HEENT: normocephalic, atraumatic, moist mucous membranes CV: regular rate and rhythm without murmurs, rubs, or gallops Lungs: clear to auscultation bilaterally with normal work of breathing Skin: warm, dry, no rashes or lesions Extremities: warm and well perfused, normal tone MSK: ROM grossly intact, strength intact, gait normal Neuro: Alert and oriented, speech normal  Assessment & Plan:   Heavy menstrual bleeding Heavy menstrual bleeding now with intermenstrual  bleeding, passing clots (has picture) and multiple soaked pads. Multiple recent pelvic exams with otherwise normal vulva, vagina, adnexa, cervix on speculum and colposcopy exams, respectively. [name] with fibroids. Will provide provera course with GYN referral for consultation on removal of fibroids and possible hysterectomy. Given she does not desire any future children and with risk factors for ovarian/cervical/uterine cancer along with +FH, believe this is reasonable. Will also check CBC given amount of bleeding. Naproxen provided for pain relief. Return and emergency precautions reviewed.  Orders Placed This Encounter  Procedures  . CBC  . Ambulatory referral to Obstetrics / Gynecology    Referral Priority:   Routine    Referral Type:   Consultation    Referral Reason:   Specialty Services Required    Requested Specialty:   Obstetrics and Gynecology    Number of Visits Requested:   1   Meds ordered this encounter  Medications  . naproxen (NAPROSYN) 500 MG tablet    Sig: Take 1 tablet (500 mg total) by mouth 2 (two) times daily with a meal.    Dispense:  30 tablet    Refill:  1  . medroxyPROGESTERone (PROVERA) 10 MG tablet    Sig: Take 1 tablet (10 mg total) by mouth daily for 10 days.    Dispense:  10 tablet    Refill:  0    Rory Percy, DO PGY-2, [facility] 07/20/2018 6:42 PM

## 2018-07-20 NOTE — Telephone Encounter (Signed)
Called patient, she has appointment today.

## 2018-07-20 NOTE — Assessment & Plan Note (Addendum)
Heavy menstrual bleeding now with intermenstrual bleeding, passing clots (has picture) and multiple soaked pads. Multiple recent pelvic exams with otherwise normal vulva, vagina, adnexa, cervix on speculum and colposcopy exams, respectively. [name] with fibroids. Will provide provera course with GYN referral for consultation on removal of fibroids and possible hysterectomy. Given she does not desire any future children and with risk factors for ovarian/cervical/uterine cancer along with +FH, believe this is reasonable. Will also check CBC given amount of bleeding. Naproxen provided for pain relief. Return and emergency precautions reviewed.

## 2018-07-20 NOTE — Patient Instructions (Addendum)
It was great to see you!  Our plans for today:  - We are referring you to Gynecology for further evaluation and treatment of your fibroids. You can discuss possibility of hysterectomy with them. - We are providing a more potent NSAID for your pain and bleeding called naproxen. DO NOT take ibuprofen, advil, or aleve with this. - We are also sending a medication to help stop your bleeding. - If you develop shortness of breath, chest pain, or worsened bleeding, call or come back to see [name].  We are checking some labs today, we will call you or send you a letter if they are abnormal.   Take care and seek immediate care sooner if you develop any concerns.   Dr. [facility]

## 2018-07-21 LAB — CBC
Hematocrit: 41.2 % (ref 34.0–46.6)
Hemoglobin: 13.5 g/dL (ref 11.1–15.9)
MCH: 27.6 pg (ref 26.6–33.0)
MCHC: 32.8 g/dL (ref 31.5–35.7)
MCV: 84 fL (ref 79–97)
Platelets: 192 10*3/uL (ref 150–450)
RBC: 4.89 x10E6/uL (ref 3.77–5.28)
RDW: 13.3 % (ref 11.7–15.4)
WBC: 10.5 10*3/uL (ref 3.4–10.8)

## 2018-08-12 ENCOUNTER — Telehealth: Payer: Self-pay | Admitting: Family Medicine

## 2018-08-12 NOTE — Telephone Encounter (Signed)
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

## 2018-08-17 ENCOUNTER — Telehealth (INDEPENDENT_AMBULATORY_CARE_PROVIDER_SITE_OTHER): Payer: Self-pay | Admitting: Obstetrics and Gynecology

## 2018-08-17 ENCOUNTER — Other Ambulatory Visit: Payer: Self-pay

## 2018-08-17 ENCOUNTER — Encounter: Payer: Self-pay | Admitting: Obstetrics and Gynecology

## 2018-08-17 DIAGNOSIS — N938 Other specified abnormal uterine and vaginal bleeding: Secondary | ICD-10-CM | POA: Insufficient documentation

## 2018-08-17 MED ORDER — MEDROXYPROGESTERONE ACETATE 10 MG PO TABS: 10.0000 mg | ORAL_TABLET | Freq: Every day | ORAL | 3 refills | Status: DC

## 2018-08-17 NOTE — Progress Notes (Signed)
   TELEHEALTH VIRTUAL GYNECOLOGY VISIT ENCOUNTER NOTE  I connected with [name] on 08/17/18 at  8:55 AM EDT by Mychart at home and verified that I am speaking with the correct person using two identifiers.   I discussed the limitations, risks, security and privacy concerns of performing an evaluation and management service by telephone and the availability of in person appointments. I also discussed with the patient that there may be a patient responsible charge related to this service. The patient expressed understanding and agreed to proceed.   History:  Mackenzie Gibson is a 33 y.o. [phone] female being evaluated today for DUB. She reports regular cycle until this past April. April's cycle was long with various degrees of bleeding. She was seen by FM and placed on Provera which stopped her bleeding. U/S was normal except for ? Uterine fibroids, but no discrete fibroids were seen. June cycle started on time and thus far has been normal except for back pain. Pt with H/O BTL and PCOS. H/O TSVD x 4  (largest 8 # 3 oz ).       Past Medical History:  Diagnosis Date  . Anxiety   . Bipolar disorder ([place])   . Bipolar disorder ([place])   . 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 Depression   . PCOS (polycystic ovarian syndrome)   . Pregnancy induced hypertension    Past Surgical History:  Procedure Laterality Date  . CHOLECYSTECTOMY    . EYE SURGERY    . TUBAL LIGATION Bilateral 03/03/2015   Procedure: POST PARTUM TUBAL LIGATION;  Surgeon: Guss Bunde, MD;  Location: [facility];  Service: Gynecology;  Laterality: Bilateral;   The following portions of the patient's history were reviewed and updated as appropriate: allergies, current medications, past family history, past medical history, past social history, past surgical history and problem list.     Review of Systems:  Pertinent items noted in HPI and remainder of comprehensive ROS otherwise negative.  Physical Exam:    General:  Alert, oriented and cooperative.   Mental Status: Normal mood and affect perceived. Normal judgment and thought content.  Physical exam deferred due to nature of the encounter  Labs and Imaging No results found for this or any previous visit (from the past 336 hour(s)). No results found.    Assessment and Plan:     DUB     DUB reviewed with pt. Declines OCP's or IUD. Tolerated Provera well. Will cycle with Provera for 3-4 months. U/R/B reviewed. F/U in 3 months   I discussed the assessment and treatment plan with the patient. The patient was provided an opportunity to ask questions and all were answered. The patient agreed with the plan and demonstrated an understanding of the instructions.   The patient was advised to call back or seek an in-person evaluation/go to the ED if the symptoms worsen or if the condition fails to improve as anticipated.  I provided 10 minutes of non-face-to-face time during this encounter.   Chancy Milroy, MD [facility] [place], [place]

## 2018-10-22 ENCOUNTER — Other Ambulatory Visit: Payer: Self-pay

## 2018-10-22 ENCOUNTER — Emergency Department (HOSPITAL_COMMUNITY): Payer: HRSA Program

## 2018-10-22 ENCOUNTER — Emergency Department (HOSPITAL_COMMUNITY)
Admission: EM | Admit: 2018-10-22 | Discharge: 2018-10-22 | Disposition: A | Discharge status: Home / Self Care | Payer: HRSA Program | Attending: Emergency Medicine | Admitting: Emergency Medicine

## 2018-10-22 ENCOUNTER — Encounter (HOSPITAL_COMMUNITY): Payer: Self-pay | Admitting: Emergency Medicine

## 2018-10-22 DIAGNOSIS — R05 Cough: Secondary | ICD-10-CM | POA: Insufficient documentation

## 2018-10-22 DIAGNOSIS — R062 Wheezing: Secondary | ICD-10-CM | POA: Diagnosis not present

## 2018-10-22 DIAGNOSIS — Z79899 Other long term (current) drug therapy: Secondary | ICD-10-CM | POA: Insufficient documentation

## 2018-10-22 DIAGNOSIS — F17210 Nicotine dependence, cigarettes, uncomplicated: Secondary | ICD-10-CM | POA: Diagnosis not present

## 2018-10-22 DIAGNOSIS — Z20822 Contact with and (suspected) exposure to covid-19: Secondary | ICD-10-CM

## 2018-10-22 DIAGNOSIS — Z20828 Contact with and (suspected) exposure to other viral communicable diseases: Secondary | ICD-10-CM | POA: Diagnosis not present

## 2018-10-22 MED ORDER — ALBUTEROL SULFATE HFA 108 (90 BASE) MCG/ACT IN AERS
2.0000 | INHALATION_SPRAY | Freq: Once | RESPIRATORY_TRACT | Status: AC
  Administered 2018-10-22: 2 via RESPIRATORY_TRACT
  Filled 2018-10-22: qty 6.7

## 2018-10-22 NOTE — ED Provider Notes (Signed)
[facility] EMERGENCY DEPARTMENT Provider Note   CSN: [id number] Arrival date & time: 10/22/18  1737     History   Chief Complaint Chief Complaint  Patient presents with  . Cough    HPI Mackenzie Gibson is a 33 y.o. female.     The history is provided by the patient.  Cough Cough characteristics:  Productive Sputum characteristics:  Nondescript Severity:  Moderate Onset quality:  Gradual Timing:  Constant Progression:  Worsening Chronicity:  New Relieved by:  Nothing Worsened by:  Nothing Ineffective treatments:  None tried Associated symptoms: no fever   Risk factors: no recent infection   Pt complains of a cough.  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  Pt reports she has used albuterol in the past  Past Medical History:  Diagnosis Date  . Anxiety   . Bipolar disorder ([place])   . Bipolar disorder ([place])   . 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 Depression   . PCOS (polycystic ovarian syndrome)   . Pregnancy induced hypertension     Patient Active Problem List   Diagnosis Date Noted  . DUB (dysfunctional uterine bleeding) 08/17/2018  . Folliculitis 22/97/9892  . Right lower quadrant abdominal pain 04/23/2018  . ASCUS with positive high risk HPV cervical 04/23/2018  . Hidradenitis suppurativa 04/23/2018  . Migraine 12/05/2017  . Heavy menstrual bleeding 10/21/2015  . S/P tubal ligation 03/03/2015  . Asthmatic bronchitis , chronic ([place]) 02/09/2015  . Obesity, morbid ([place])   . Bipolar disorder ([place]) 01/12/2014  . Chronic pelvic pain in female 01/12/2014  . Eczema, dyshidrotic 12/20/2013  . Tobacco use disorder 03/06/2009  . SOMATIZATION DISORDER 07/27/2008  . PANIC DISORDER 10/20/2006  . POLYCYSTIC OVARIAN DISEASE 03/04/2004    Past Surgical History:  Procedure Laterality Date  . CHOLECYSTECTOMY    . EYE SURGERY    . TUBAL LIGATION Bilateral 03/03/2015   Procedure: POST PARTUM TUBAL LIGATION;  Surgeon: Guss Bunde,  MD;  Location: [facility];  Service: Gynecology;  Laterality: Bilateral;     OB History    Gravida  5   Para  4   Term  4   Preterm  0   AB  1   Living  4     SAB  1   TAB  0   Ectopic  0   Multiple  0   Live Births  4            Home Medications    Prior to Admission medications   Medication Sig Start Date End Date Taking? Authorizing Provider  acetaminophen (TYLENOL) 500 MG tablet Take 2 tablets (1,000 mg total) by mouth every 6 (six) hours as needed for mild pain or headache. 01/19/15   Anyanwu, Sallyanne Havers, MD  albuterol (PROVENTIL HFA;VENTOLIN HFA) 108 (90 Base) MCG/ACT inhaler Inhale 1 puff into the lungs every 6 (six) hours as needed for wheezing or shortness of breath. 05/21/18   Martyn Malay, MD  beclomethasone (QVAR) 40 MCG/ACT inhaler Inhale 2 puffs into the lungs 2 (two) times daily. 02/09/15   Woodroe Mode, MD  ibuprofen (ADVIL,MOTRIN) 800 MG tablet Take 1 tablet (800 mg total) by mouth 3 (three) times daily as needed for headache. 01/12/18   Shirley, [name], DO  medroxyPROGESTERone (PROVERA) 10 MG tablet Take 1 tablet (10 mg total) by mouth daily. Use first ten days of each month 08/17/18   Chancy Milroy, MD  metFORMIN (GLUCOPHAGE) 500  MG tablet Take 1 tablet (500 mg total) by mouth daily with breakfast. Patient not taking: Reported on 04/21/2018 01/12/18   Shirley, [name], DO  naproxen (NAPROSYN) 500 MG tablet Take 1 tablet (500 mg total) by mouth 2 (two) times daily with a meal. Patient not taking: Reported on 08/17/2018 07/20/18   Rory Percy, DO    Family History Family History  Problem Relation Age of Onset  . Depression Mother   . Bipolar disorder Mother   . Rheum arthritis Mother   . Cervical cancer Mother   . Depression Father   . High blood pressure Father   . Bipolar disorder Maternal Aunt   . Cervical cancer Sister   . Cervical cancer Maternal Grandmother     Social History Social History   Tobacco Use  . Smoking status:  Current Every Day Smoker    Packs/day: 1.00    Types: Cigarettes  . Smokeless tobacco: Never Used  Substance Use Topics  . Alcohol use: Yes    Comment: occasionally, once/month  . Drug use: No     Allergies   Hydrocodone   Review of Systems Review of Systems  Constitutional: Negative for fever.  Respiratory: Positive for cough.   All other systems reviewed and are negative.    Physical Exam Updated Vital Signs BP 137/63   Pulse 92   Temp 98.9 F (37.2 C) (Oral)   Resp 16   Ht 5\' 7"  (1.702 m)   Wt 125.2 kg   LMP 10/13/2018   SpO2 98%   BMI 43.23 kg/m   Physical Exam Vitals signs reviewed.  HENT:     Right Ear: Tympanic membrane normal.     Left Ear: Tympanic membrane normal.     Nose: Nose normal.     Mouth/Throat:     Mouth: Mucous membranes are moist.  Eyes:     Pupils: Pupils are equal, round, and reactive to light.  Cardiovascular:     Rate and Rhythm: Normal rate.  Pulmonary:     Breath sounds: Wheezing present.  Abdominal:     General: Abdomen is flat.  Musculoskeletal: Normal range of motion.  Skin:    General: Skin is warm.  Neurological:     General: No focal deficit present.     Mental Status: She is alert.  Psychiatric:        Mood and Affect: Mood normal.      ED Treatments / Results  Labs (all labs ordered are listed, but only abnormal results are displayed) Labs Reviewed  NOVEL CORONAVIRUS, NAA (HOSPITAL ORDER, SEND-OUT TO REF LAB)    EKG None  Radiology Dg Chest Port 1 View  Result Date: 10/22/2018 CLINICAL DATA:  Cough and fatigue. EXAM: PORTABLE CHEST 1 VIEW COMPARISON:  05/30/2018 FINDINGS: Lungs are adequately inflated without consolidation or effusion. Cardiomediastinal silhouette and remainder of the exam is unchanged. IMPRESSION: No active disease. Electronically Signed   By: Marin Olp M.D.   On: 10/22/2018 20:22    Procedures Procedures (including critical care time)  Medications Ordered in ED Medications   albuterol (VENTOLIN HFA) 108 (90 Base) MCG/ACT inhaler 2 puff (2 puffs Inhalation Given 10/22/18 2042)     Initial Impression / Assessment and Plan / ED Course  I have reviewed the triage vital signs and the nursing notes.  Pertinent labs & imaging results that were available during my care of the patient were reviewed by me and considered in my medical decision making (see chart for details).  MDM   Pt given albuterol inhaler,  Chest xray is normal.  Pt advised to use albuterol.  Covid test is pending  Final Clinical Impressions(s) / ED Diagnoses   Final diagnoses:  Suspected Covid-19 Virus Infection    ED Discharge Orders    None    An After Visit Summary was printed and given to the patient.    Fransico Meadow, Hershal Coria 10/22/18 2130    Margette Fast, MD 10/23/18 1247

## 2018-10-22 NOTE — ED Triage Notes (Signed)
Patient reports cough, wants to be tested for COVID. Cannot return to work without a negative test.

## 2018-10-23 LAB — SARS CORONAVIRUS 2 (TAT 6-24 HRS): SARS Coronavirus 2: NEGATIVE

## 2018-11-03 ENCOUNTER — Other Ambulatory Visit: Payer: Self-pay

## 2018-11-03 ENCOUNTER — Ambulatory Visit (INDEPENDENT_AMBULATORY_CARE_PROVIDER_SITE_OTHER): Payer: Self-pay | Admitting: Family Medicine

## 2018-11-03 DIAGNOSIS — L732 Hidradenitis suppurativa: Secondary | ICD-10-CM

## 2018-11-03 MED ORDER — DOXYCYCLINE HYCLATE 100 MG PO TABS: 100.0000 mg | ORAL_TABLET | Freq: Two times a day (BID) | ORAL | 0 refills | Status: AC

## 2018-11-03 NOTE — Assessment & Plan Note (Addendum)
Suspect dermatological findings are secondary to hidradenitis suppurativa given morphology and distribution within intertriginous areas. However, could continue to consider folliculitis or prurigo nodularis (but would have expected lesions to be more pruritic than they are for this).  Additionally, some concern for overlying developing mild cellulitis given surrounding erythema/warmth to touch in some locations, but reassured she is otherwise been afebrile and well. Therapies are limited by cost, patient currently only has Medicaid family-planning, unable to afford first-line therapy of topical clindamycin.  - Doxycycline 100mg  twice daily for 7 days - Continue metformin, especially given concurrent PCOS - Encouraged proper hygiene, using un-fragranced body washes for sensitive skin such as Dove, loose clothing, cotton underwear -Additionally congratulated her on weight loss already achieved and encouraged her to continue moving forward with this to aid in improvement - Follow-up in 2 weeks to assess changes, may need to use doxycycline long-term vs consider adding on spironolactone

## 2018-11-03 NOTE — Patient Instructions (Signed)
It was so wonderful seeing you today.  I am so sorry that you are struggling with this.  I have sent in doxycycline, an antibiotic, to your pharmacy to take by mouth for approximately 7 days.  Make sure that you use an unscented, sensitive skin body wash on a regular basis.  I encourage you to also continue working towards weight loss as you have already done such an amazing job--this will help.  Cotton clothing, loose fitting.  I would like you to follow-up in the next several weeks with your primary care provider for monitoring your skin and assess if additional therapies are needed.

## 2018-11-03 NOTE — Progress Notes (Signed)
Subjective:    Patient ID: Mackenzie Gibson, female    DOB: 07/17/1985, 33 y.o.   MRN: [id number]   CC: bumps acting up   HPI: Mackenzie Gibson is a 33 year old female with a history of PCOS, folliculitis/hidradenitis suppurativa, and obesity presenting discuss the following:  Patient is quite frustrated that she continues to have "bumps/boils" in several places on her body.  She feels embarrassed due to their appearance.  States this is been a problem for quite some time now, previously told it was folliculitis and had tried chlorhexidine wash with minimal improvement.  Also stated it burned and so she only used it for 1 week. However, do also notice a diagnosis of hidradenitis suppurativa in her chart after PCP noticed lesions within her groin area, patient was not familiar with this.  Says previously these lesions were mainly within her armpits, groin, and underneath her breasts, however now she is noticing them showing up underneath her abdominal pannus.  Some areas drain, often bloody drainage. Sometimes itchy, but not often. The areas will also cause scarring.  She uses Suave scented soap.  Trying to avoid wearing tight clothes.  She also has lost about 15 pounds since last year with cutting back sweets.  She has history of tubal ligation.  She feels like maybe 1 of these areas is infected, one boil at her hip fold is quite painful and seems to be red around it.  Spontaneously drained this morning.  Denies any fever, chills, warmth to touch.    Review of Systems Per HPI, also denies recent illness, fever, headache, changes in vision, chest pain, shortness of breath, abdominal pain, N/V/D, weakness   Patient Active Problem List   Diagnosis Date Noted  . DUB (dysfunctional uterine bleeding) 08/17/2018  . Folliculitis [id number]  . Right lower quadrant abdominal pain 04/23/2018  . ASCUS with positive high risk HPV cervical 04/23/2018  . Hidradenitis suppurativa 04/23/2018  . Migraine  12/05/2017  . Heavy menstrual bleeding 10/21/2015  . S/P tubal ligation 03/03/2015  . Asthmatic bronchitis , chronic ([place]) 02/09/2015  . Obesity, morbid ([place])   . Bipolar disorder ([place]) 01/12/2014  . Chronic pelvic pain in female 01/12/2014  . Eczema, dyshidrotic 12/20/2013  . Tobacco use disorder 03/06/2009  . SOMATIZATION DISORDER 07/27/2008  . PANIC DISORDER 10/20/2006  . POLYCYSTIC OVARIAN DISEASE 03/04/2004     Objective:  BP 125/85   Pulse 84   Wt 276 lb 12.8 oz (125.6 kg)   LMP 10/13/2018   SpO2 98%   BMI 43.35 kg/m  Vitals and nursing note reviewed  General: NAD, pleasant Cardiac: RRR, normal heart sounds, no murmurs Respiratory: CTAB, normal effort Abdomen: soft, nontender, nondistended Extremities: no edema or cyanosis. WWP. Skin: warm and dry, multiple erythematous open and closed comedones with several regions of scarring across her entire pannus, bilateral medial thigh, and underneath left breast.  Right axilla with few lesions.  Additionally note boil in her right hip fold which recently spontaneously drained that is painful with palpation and with skin erythema surrounding and mild warmth to touch. Neuro: alert and oriented, no focal deficits Psych: normal affect        Assessment & Plan:   Hidradenitis suppurativa Suspect dermatological findings are secondary to hidradenitis suppurativa given morphology and distribution within intertriginous areas. However, could continue to consider folliculitis or prurigo nodularis (but would have expected lesions to be more pruritic than they are for this).  Additionally, some concern for overlying developing mild cellulitis given  surrounding erythema/warmth to touch in some locations, but reassured she is otherwise been afebrile and well. Therapies are limited by cost, patient currently only has Medicaid family-planning, unable to afford first-line therapy of topical clindamycin.  - Doxycycline 100mg  twice daily for 7  days - Continue metformin, especially given concurrent PCOS - Encouraged proper hygiene, using un-fragranced body washes for sensitive skin such as Dove, loose clothing, cotton underwear -Additionally congratulated her on weight loss already achieved and encouraged her to continue moving forward with this to aid in improvement - Follow-up in 2 weeks to assess changes, may need to use doxycycline long-term vs consider adding on spironolactone   [place] Resident PGY-2

## 2018-11-06 ENCOUNTER — Telehealth: Payer: Self-pay | Admitting: Family Medicine

## 2018-11-06 NOTE — Telephone Encounter (Signed)
Letters created and faxed to her job.

## 2018-11-06 NOTE — Telephone Encounter (Signed)
Patient says she needs 2 doctors notes faxed to her work. The first letter is stating she got a COVID test a few weeks ago and it was negative. Patient says she got this letter through [place] but that will not work since her job requires it to be signed by the doctor.   The second letter she needs is concerning her pinky toe. She broke her pinky toe a few days ago and the only shoes she can wear right now are sandals due to the swelling. She got wrote up at work for wearing sandals and was told she needs a signed doctors note stating she needs to wear sandals for a couple of weeks or until the swelling goes down.   Patient needs both letters need to be signed and faxed to her job at [phone] by Monday September 8th because that is when she goes back to work. Please call patient once these have been faxed so she knows to tell her work.

## 2018-11-06 NOTE — Telephone Encounter (Signed)
Will forward to MD. Jazmin Hartsell,CMA  

## 2018-11-24 ENCOUNTER — Ambulatory Visit (INDEPENDENT_AMBULATORY_CARE_PROVIDER_SITE_OTHER): Payer: Self-pay | Admitting: Family Medicine

## 2018-11-24 ENCOUNTER — Other Ambulatory Visit: Payer: Self-pay

## 2018-11-24 DIAGNOSIS — O10913 Unspecified pre-existing hypertension complicating pregnancy, third trimester: Secondary | ICD-10-CM

## 2018-11-24 DIAGNOSIS — M79602 Pain in left arm: Secondary | ICD-10-CM

## 2018-11-24 DIAGNOSIS — F17200 Nicotine dependence, unspecified, uncomplicated: Secondary | ICD-10-CM

## 2018-11-24 DIAGNOSIS — L732 Hidradenitis suppurativa: Secondary | ICD-10-CM

## 2018-11-24 DIAGNOSIS — Z23 Encounter for immunization: Secondary | ICD-10-CM

## 2018-11-24 MED ORDER — CLINDAMYCIN PHOSPHATE 1 % EX SOLN: Freq: Two times a day (BID) | CUTANEOUS | 0 refills | Status: DC

## 2018-11-24 MED ORDER — IBUPROFEN 800 MG PO TABS: 800.0000 mg | ORAL_TABLET | Freq: Three times a day (TID) | ORAL | 0 refills | Status: DC | PRN

## 2018-11-24 MED ORDER — QVAR 40 MCG/ACT IN AERS: 2.0000 | INHALATION_SPRAY | Freq: Two times a day (BID) | RESPIRATORY_TRACT | 12 refills | Status: DC

## 2018-11-24 MED ORDER — CYCLOBENZAPRINE HCL 5 MG PO TABS: 5.0000 mg | ORAL_TABLET | Freq: Three times a day (TID) | ORAL | 0 refills | Status: DC | PRN

## 2018-11-24 NOTE — Patient Instructions (Signed)
Thank you for coming to see me today. It was a pleasure! Today we talked about:   For your skin, please use the clindamycin cream on top of the areas and try not to pick at them. Please wear baggy clothes so your clothes are no rubbing against the areas.   For your arm pain, you are quite tense. Please use heating pads, take warm baths and then have someone massage your shoulders. I have also sent in a muscle relaxer to use as needed. Please do not drive after taking this the first time if they make you sleepy.   Please follow-up with me in a few weeks or sooner as needed. Also call GYN and follow up with them.   If you have any questions or concerns, please do not hesitate to call the office at [phone].  Take Care,   [name] Tramon Crescenzo, DO

## 2018-11-24 NOTE — Progress Notes (Signed)
Subjective:  Patient ID: Mackenzie Gibson  DOB: December 27, 1985 MRN: [id number]  Mackenzie Gibson is a 33 y.o. female with a PMH of PCOS, folliculitis/hidradenitis suppurativa, and obesity, Bipolar disorder, here today for bumps on body, left arm pain.   HPI:  Hidradenitis suppurativa: -Patient reports that she is continuing to have the bumps/boils in several places on her body.  She states that they are increasing around her belly.  She currently picks them pretty frequently.  She has tried chlorhexidine wash with minimal improvement.   -She states that lesions are in her groin, armpits and underneath her breast but it is worsening around her pannus.  Reports that some areas will often drain.  Sometimes they are bloody but this is after she picks it.  Patient states that it is really itchy.  She does not like the look of it as they often cause scarring.   -She reports that she has previously tried to avoid tight clothing.  She has started to gain back some weight that she has lost given increased stress at home.   -She denies any fevers, chills.  -She completed the course of doxycycline provided at her office visit on 11/03/2018.  She did not notice any improvement after completing the doxycycline.  Left arm pain: -Patient reports that she has been having some pain in her left arm.  Ports that it will shoot down from her mid arm and sometimes her hand will go numb.  She states that her hand will often also swell at night.  The pain will radiate to the top of her shoulder on and off.  She states that this is only been present for the past 2 to 3 days.  She states that she thinks she may have slept wrong on her arm. -She does report that she woke up with a crick in her neck recently and noticed that it kind of worsened after this.  Anxiety with stressors: -Patient reports that she is currently going through a lot of stress in her house that she currently has houseguests with children living with her.   These children do not get along with her children.  She does not have any outlets and has been smoking more than normal. -Reports that she used to take a bath for her stress however she stopped this due to the bumps all over her skin as she did not want to worsen them  Tobacco use disorder -Patient reports that she has increased her smoking to 1.5 ppd now.  He is interested in quitting but is not interested in help from [name] at this time.  ROS: as mentioned in HPI  Social hx: Denies use of illicit drugs, alcohol use Smoking status reviewed-has been smoking more than she previously did due to her high stress environment given that she is also allowing other people to live in her house to have kids  Patient Active Problem List   Diagnosis Date Noted  . Left arm pain 11/30/2018  . DUB (dysfunctional uterine bleeding) 08/17/2018  . ASCUS with positive high risk HPV cervical 04/23/2018  . Hidradenitis suppurativa 04/23/2018  . Migraine 12/05/2017  . S/P tubal ligation 03/03/2015  . Asthmatic bronchitis , chronic ([place]) 02/09/2015  . Obesity, morbid ([place])   . Bipolar disorder ([place]) 01/12/2014  . Chronic pelvic pain in female 01/12/2014  . Eczema, dyshidrotic 12/20/2013  . Tobacco use disorder 03/06/2009  . PANIC DISORDER 10/20/2006  . POLYCYSTIC OVARIAN DISEASE 03/04/2004     Objective:  BP 140/85   Pulse 89   Wt 280 lb 3.2 oz (127.1 kg)   SpO2 99%   BMI 43.89 kg/m   Vitals and nursing note reviewed  General: NAD, pleasant Pulm: normal effort Extremities: no edema or cyanosis. WWP. Skin: warm and dry, multiple erythematous open and closed comedones with several regions of scarring across pannus and underneath left breast.  Multiple areas of excoriation with evidence of patient picking. Neuro: alert and oriented, no focal deficits Psych: normal affect, normal thought content, tangential speech, rushed speech  Assessment & Plan:   Hidradenitis suppurativa Patient provided with  good Rx for clindamycin cream to apply to skin and counseled on trying not to pick at them.  Patient also advised to wear loose clothing.  Counseled on the importance of weight loss along with smoking cessation for this to improve long-term.  Tobacco use disorder Patient is currently not interested in help with smoking cessation from our clinic but was counseled on the importance of needing to stop.  Left arm pain Left arm pain likely MSK in origin given exam findings.  No red flag symptoms.  No focal deficits.  No weakness noted.  Patient with significant tension noted in the trapezius on left side.  Patient counseled on need for heating pad, warm baths, massages.  Provided with Flexeril in order to see if this helps with pain.  Patient is to return in 3-4 weeks if pain worsens or does not improve.  Anxiety with stressors Patient currently has extra children and people living in her house.  Advised her to take time for herself including her baths  [name] Jeris Easterly, DO Family Medicine Resident PGY-3

## 2018-11-30 DIAGNOSIS — M79602 Pain in left arm: Secondary | ICD-10-CM | POA: Insufficient documentation

## 2018-11-30 NOTE — Assessment & Plan Note (Signed)
Patient provided with good Rx for clindamycin cream to apply to skin and counseled on trying not to pick at them.  Patient also advised to wear loose clothing.  Counseled on the importance of weight loss along with smoking cessation for this to improve long-term.

## 2018-11-30 NOTE — Assessment & Plan Note (Signed)
>>  ASSESSMENT AND PLAN FOR TOBACCO USE DISORDER WRITTEN ON 11/30/2018  9:37 AM BY SHIRLEY, [name], DO  Patient is currently not interested in help with smoking cessation from our clinic but was counseled on the importance of needing to stop.

## 2018-11-30 NOTE — Assessment & Plan Note (Signed)
Left arm pain likely MSK in origin given exam findings.  No red flag symptoms.  No focal deficits.  No weakness noted.  Patient with significant tension noted in the trapezius on left side.  Patient counseled on need for heating pad, warm baths, massages.  Provided with Flexeril in order to see if this helps with pain.  Patient is to return in 3-4 weeks if pain worsens or does not improve.

## 2018-11-30 NOTE — Assessment & Plan Note (Signed)
Patient is currently not interested in help with smoking cessation from our clinic but was counseled on the importance of needing to stop.

## 2018-12-15 ENCOUNTER — Other Ambulatory Visit: Payer: Self-pay

## 2018-12-15 DIAGNOSIS — Z20822 Contact with and (suspected) exposure to covid-19: Secondary | ICD-10-CM

## 2018-12-15 DIAGNOSIS — Z20828 Contact with and (suspected) exposure to other viral communicable diseases: Secondary | ICD-10-CM | POA: Diagnosis not present

## 2018-12-17 LAB — NOVEL CORONAVIRUS, NAA: SARS-CoV-2, NAA: NOT DETECTED

## 2018-12-26 ENCOUNTER — Ambulatory Visit (INDEPENDENT_AMBULATORY_CARE_PROVIDER_SITE_OTHER)
Admission: RE | Admit: 2018-12-26 | Discharge: 2018-12-26 | Disposition: A | Discharge status: Home / Self Care | Payer: Medicaid Other | Source: Ambulatory Visit

## 2018-12-26 DIAGNOSIS — J209 Acute bronchitis, unspecified: Secondary | ICD-10-CM

## 2018-12-26 MED ORDER — PREDNISONE 10 MG (21) PO TBPK: ORAL_TABLET | Freq: Every day | ORAL | 0 refills | Status: DC

## 2018-12-26 MED ORDER — AZITHROMYCIN 250 MG PO TABS: 250.0000 mg | ORAL_TABLET | Freq: Every day | ORAL | 0 refills | Status: DC

## 2018-12-26 NOTE — ED Provider Notes (Signed)
Virtual Visit via Video Note:  Mackenzie Gibson  initiated request for Telemedicine visit with [facility] team. I connected with Mackenzie Gibson  on 12/26/2018 at 8:33 AM  for a synchronized telemedicine visit using a video enabled HIPPA compliant telemedicine application. I verified that I am speaking with Mackenzie Gibson  using two identifiers. [place] Hall-Potvin, PA-C  was physically located in a [facility] site and [place] was located at a different location.   The limitations of evaluation and management by telemedicine as well as the availability of in-person appointments were discussed. Patient was informed that she  may incur a bill ( including co-pay) for this virtual visit encounter. Mackenzie Gibson  expressed understanding and gave verbal consent to proceed with virtual visit.     History of Present Illness:Mackenzie Gibson  is a 33 y.o. female presents with history of tobacco use, PCOS, BPD presenting for worsening cough over the last week.  Patient underwent Covid testing on 10/13: Negative.  Patient states she is having a productive, nonhemoptic cough with thick, white/clear mucus.  Patient has tried "everything over-the-counter "including DayQuil, throat lozenges, Mucinex without significant relief.  Patient does have an albuterol inhaler at home, denies any refills, which has been helpful with "feeling like and get a good breath ".  Patient denies fever, loss of smell/taste, sinus pain, sore throat, chest pain, shortness of breath.  Of note, patient recently finished course of doxycycline for skin rash.  Patient has been able to monitor her O2 at home: 98% on room air.  Of note patient has not reduced cigarette use during this time.   Review of Systems  Constitutional: Negative for fever and malaise/fatigue.  HENT: Negative for congestion, ear pain, sinus pain and sore throat.   Eyes: Negative for pain and redness.  Respiratory: Positive  for cough and sputum production. Negative for shortness of breath and wheezing.   Cardiovascular: Negative for chest pain and palpitations.  Gastrointestinal: Negative for abdominal pain, diarrhea and vomiting.  Musculoskeletal: Negative for joint pain and myalgias.     Past Medical History:  Diagnosis Date  . Anxiety   . Bipolar disorder ([place])   . Bipolar disorder ([place])   . 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 Depression   . PCOS (polycystic ovarian syndrome)   . Pregnancy induced hypertension     Allergies  Allergen Reactions  . Hydrocodone Anaphylaxis and Other (See Comments)     Tongue swelling        Observations/Objective: 33 year old female Sitting in no acute distress.  Patient is able to speak in full sentences without coughing, sneezing, wheezing.  Assessment and Plan: 1.  Acute bronchitis We will trial prednisone taper in conjunction with azithromycin (patient had EKG on 12/04/2017 without QTC prolongation).  Reviewed that patient should notice improvement over the next 2 to 3 days, and if not should be evaluated person to rule out pneumonia.  Return precautions discussed, patient verbalized understanding and is agreeable to plan.  Follow Up Instructions: Patient to follow-up in clinic for in-person evaluation/possible imaging for persistent, worsening symptoms.   I discussed the assessment and treatment plan with the patient. The patient was provided an opportunity to ask questions and all were answered. The patient agreed with the plan and demonstrated an understanding of the instructions.   The patient was advised to call back or seek an in-person evaluation if the symptoms worsen or if the condition fails to  improve as anticipated.  I provided 15 minutes of non-face-to-face time during this encounter.    Maribel, PA-C  12/26/2018 8:33 AM        Hall-Potvin, [place], [place] 12/26/18 [phone]

## 2018-12-26 NOTE — Discharge Instructions (Addendum)
Take steroid taper as prescribed. Recommend he start Z-Pak today as well. You should notice improvement in your cough over the next 2 to 3 days - if not would recommend in person evaluation, possible imaging.

## 2019-01-02 ENCOUNTER — Encounter (HOSPITAL_COMMUNITY): Payer: Self-pay

## 2019-01-02 ENCOUNTER — Other Ambulatory Visit: Payer: Self-pay

## 2019-01-02 ENCOUNTER — Emergency Department (HOSPITAL_COMMUNITY): Payer: Self-pay

## 2019-01-02 ENCOUNTER — Emergency Department (HOSPITAL_COMMUNITY)
Admission: EM | Admit: 2019-01-02 | Discharge: 2019-01-02 | Disposition: A | Discharge status: Home / Self Care | Payer: Self-pay | Attending: Emergency Medicine | Admitting: Emergency Medicine

## 2019-01-02 DIAGNOSIS — F17210 Nicotine dependence, cigarettes, uncomplicated: Secondary | ICD-10-CM | POA: Insufficient documentation

## 2019-01-02 DIAGNOSIS — R531 Weakness: Secondary | ICD-10-CM | POA: Insufficient documentation

## 2019-01-02 DIAGNOSIS — Z79899 Other long term (current) drug therapy: Secondary | ICD-10-CM | POA: Insufficient documentation

## 2019-01-02 HISTORY — DX: Papillomavirus as the cause of diseases classified elsewhere: B97.7

## 2019-01-02 LAB — CBC WITH DIFFERENTIAL/PLATELET
Abs Immature Granulocytes: 0.21 10*3/uL — ABNORMAL HIGH (ref 0.00–0.07)
Basophils Absolute: 0.1 10*3/uL (ref 0.0–0.1)
Basophils Relative: 1 %
Eosinophils Absolute: 0.4 10*3/uL (ref 0.0–0.5)
Eosinophils Relative: 2 %
HCT: 45.8 % (ref 36.0–46.0)
Hemoglobin: 14.7 g/dL (ref 12.0–15.0)
Immature Granulocytes: 1 %
Lymphocytes Relative: 17 %
Lymphs Abs: 2.6 10*3/uL (ref 0.7–4.0)
MCH: 27.1 pg (ref 26.0–34.0)
MCHC: 32.1 g/dL (ref 30.0–36.0)
MCV: 84.5 fL (ref 80.0–100.0)
Monocytes Absolute: 0.9 10*3/uL (ref 0.1–1.0)
Monocytes Relative: 6 %
Neutro Abs: 11 10*3/uL — ABNORMAL HIGH (ref 1.7–7.7)
Neutrophils Relative %: 73 %
Platelets: 240 10*3/uL (ref 150–400)
RBC: 5.42 MIL/uL — ABNORMAL HIGH (ref 3.87–5.11)
RDW: 14.4 % (ref 11.5–15.5)
WBC: 15.2 10*3/uL — ABNORMAL HIGH (ref 4.0–10.5)
nRBC: 0 % (ref 0.0–0.2)

## 2019-01-02 LAB — URINALYSIS, ROUTINE W REFLEX MICROSCOPIC
Bilirubin Urine: NEGATIVE
Glucose, UA: NEGATIVE mg/dL
Hgb urine dipstick: NEGATIVE
Ketones, ur: NEGATIVE mg/dL
Leukocytes,Ua: NEGATIVE
Nitrite: NEGATIVE
Protein, ur: NEGATIVE mg/dL
Specific Gravity, Urine: 1.026 (ref 1.005–1.030)
pH: 5 (ref 5.0–8.0)

## 2019-01-02 LAB — COMPREHENSIVE METABOLIC PANEL
ALT: 23 U/L (ref 0–44)
AST: 16 U/L (ref 15–41)
Albumin: 3.9 g/dL (ref 3.5–5.0)
Alkaline Phosphatase: 50 U/L (ref 38–126)
Anion gap: 8 (ref 5–15)
BUN: 15 mg/dL (ref 6–20)
CO2: 24 mmol/L (ref 22–32)
Calcium: 9 mg/dL (ref 8.9–10.3)
Chloride: 105 mmol/L (ref 98–111)
Creatinine, Ser: 0.69 mg/dL (ref 0.44–1.00)
GFR calc Af Amer: >60 mL/min (ref >60)
GFR calc non Af Amer: >60 mL/min (ref >60)
Glucose, Bld: 85 mg/dL (ref 70–99)
Potassium: 4.2 mmol/L (ref 3.5–5.1)
Sodium: 137 mmol/L (ref 135–145)
Total Bilirubin: 0.4 mg/dL (ref 0.3–1.2)
Total Protein: 7.4 g/dL (ref 6.5–8.1)

## 2019-01-02 LAB — TSH: TSH: 3.193 u[IU]/mL (ref 0.350–4.500)

## 2019-01-02 NOTE — ED Triage Notes (Addendum)
Pt reports that she completed z pack and prednisone 2 days ago for bronchitis. Pt thinks he potassium is low. Noticed heart palpitations. Increased urnation, muscle weakness, lightheaded, lips feels numb, fingers are cold . Cramps in back of legs at night

## 2019-01-02 NOTE — Discharge Instructions (Addendum)
Tests were good.  Increase fluids.  Follow-up with your primary care doctor.

## 2019-01-02 NOTE — ED Provider Notes (Addendum)
[facility] EMERGENCY DEPARTMENT Provider Note   CSN: [id number] Arrival date & time: 01/02/19  1010     History   Chief Complaint Chief Complaint  Patient presents with  . Dizziness  . muscle weakness    HPI Mackenzie Gibson is a 33 y.o. female.     Chief complaint weakness.  Patient reports a constellation of symptoms including weakness, increased urination, palpitations, lightheadedness, lips numb, fingers cold, cramps in the back of her legs for a couple weeks.  She was recently placed on Zithromax and prednisone for bronchitis.  No other new medications.  Past medical history includes PCOS, obesity, bipolar disorder.  Severity of symptoms mild to moderate.  Nothing makes symptoms better or worse.     Past Medical History:  Diagnosis Date  . Anxiety   . Bipolar disorder ([place])   . Bipolar disorder ([place])   . 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 Depression   . HPV (human papilloma virus) infection   . PCOS (polycystic ovarian syndrome)   . Pregnancy induced hypertension     Patient Active Problem List   Diagnosis Date Noted  . Left arm pain 11/30/2018  . DUB (dysfunctional uterine bleeding) 08/17/2018  . ASCUS with positive high risk HPV cervical 04/23/2018  . Hidradenitis suppurativa 04/23/2018  . Migraine 12/05/2017  . S/P tubal ligation 03/03/2015  . Asthmatic bronchitis , chronic ([place]) 02/09/2015  . Obesity, morbid ([place])   . Bipolar disorder ([place]) 01/12/2014  . Chronic pelvic pain in female 01/12/2014  . Eczema, dyshidrotic 12/20/2013  . Tobacco use disorder 03/06/2009  . PANIC DISORDER 10/20/2006  . POLYCYSTIC OVARIAN DISEASE 03/04/2004    Past Surgical History:  Procedure Laterality Date  . CHOLECYSTECTOMY    . EYE SURGERY    . TUBAL LIGATION Bilateral 03/03/2015   Procedure: POST PARTUM TUBAL LIGATION;  Surgeon: Guss Bunde, MD;  Location: [facility];  Service: Gynecology;  Laterality: Bilateral;     OB History    Gravida  5   Para  4   Term  4   Preterm  0   AB  1   Living  4     SAB  1   TAB  0   Ectopic  0   Multiple  0   Live Births  4            Home Medications    Prior to Admission medications   Medication Sig Start Date End Date Taking? Authorizing Provider  acetaminophen (TYLENOL) 500 MG tablet Take 2 tablets (1,000 mg total) by mouth every 6 (six) hours as needed for mild pain or headache. 01/19/15   Anyanwu, Sallyanne Havers, MD  albuterol (PROVENTIL HFA;VENTOLIN HFA) 108 (90 Base) MCG/ACT inhaler Inhale 1 puff into the lungs every 6 (six) hours as needed for wheezing or shortness of breath. 05/21/18   Martyn Malay, MD  azithromycin (ZITHROMAX) 250 MG tablet Take 1 tablet (250 mg total) by mouth daily. Take first 2 tablets together, then 1 every day until finished. 12/26/18   Hall-Potvin, [place], PA-C  beclomethasone (QVAR) 40 MCG/ACT inhaler Inhale 2 puffs into the lungs 2 (two) times daily. 11/24/18   Shirley, [name], DO  clindamycin (CLEOCIN-T) 1 % external solution Apply topically 2 (two) times daily. 11/24/18   Shirley, [name], DO  cyclobenzaprine (FLEXERIL) 5 MG tablet Take 1 tablet (5 mg total) by mouth 3 (three) times daily as needed for muscle spasms. 11/24/18   Shirley, [name], DO  ibuprofen (  ADVIL) 800 MG tablet Take 1 tablet (800 mg total) by mouth 3 (three) times daily as needed for headache. 11/24/18   Shirley, [name], DO  metFORMIN (GLUCOPHAGE) 500 MG tablet Take 1 tablet (500 mg total) by mouth daily with breakfast. Patient not taking: Reported on 04/21/2018 01/12/18   Shirley, [name], DO  predniSONE (STERAPRED UNI-PAK 21 TAB) 10 MG (21) TBPK tablet Take by mouth daily. Take steroid taper as written 12/26/18   Hall-Potvin, [place], PA-C    Family History Family History  Problem Relation Age of Onset  . Depression Mother   . Bipolar disorder Mother   . Rheum arthritis Mother   . Cervical cancer Mother   . Depression Father   . High blood pressure Father   . Bipolar  disorder Maternal Aunt   . Cervical cancer Sister   . Cervical cancer Maternal Grandmother     Social History Social History   Tobacco Use  . Smoking status: Current Every Day Smoker    Packs/day: 1.00    Types: Cigarettes  . Smokeless tobacco: Never Used  Substance Use Topics  . Alcohol use: Yes    Comment: occasionally, once/month  . Drug use: No     Allergies   Hydrocodone   Review of Systems Review of Systems  All other systems reviewed and are negative.    Physical Exam Updated Vital Signs BP 129/66   Pulse 76   Temp 98.3 F (36.8 C) (Oral)   Resp 10   Ht 5\' 7"  (1.702 m)   Wt 126.6 kg   SpO2 98%   BMI 43.70 kg/m   Physical Exam Vitals signs and nursing note reviewed.  Constitutional:      Appearance: She is well-developed.     Comments: NAD;  elevated BMI  HENT:     Head: Normocephalic and atraumatic.  Eyes:     Conjunctiva/sclera: Conjunctivae normal.  Neck:     Musculoskeletal: Neck supple.  Cardiovascular:     Rate and Rhythm: Normal rate and regular rhythm.  Pulmonary:     Effort: Pulmonary effort is normal.     Breath sounds: Normal breath sounds.  Abdominal:     General: Bowel sounds are normal.     Palpations: Abdomen is soft.  Musculoskeletal: Normal range of motion.  Skin:    General: Skin is warm and dry.  Neurological:     General: No focal deficit present.     Mental Status: She is alert and oriented to person, place, and time.  Psychiatric:        Behavior: Behavior normal.      ED Treatments / Results  Labs (all labs ordered are listed, but only abnormal results are displayed) Labs Reviewed  CBC WITH DIFFERENTIAL/PLATELET - Abnormal; Notable for the following components:      Result Value   WBC 15.2 (*)    RBC 5.42 (*)    Neutro Abs 11.0 (*)    Abs Immature Granulocytes 0.21 (*)    All other components within normal limits  COMPREHENSIVE METABOLIC PANEL  URINALYSIS, ROUTINE W REFLEX MICROSCOPIC  TSH    EKG  EKG Interpretation  Date/Time:  Saturday January 02 2019 10:57:12 EDT Ventricular Rate:  83 PR Interval:    QRS Duration: 93 QT Interval:  365 QTC Calculation: 429 R Axis:   58 Text Interpretation: Sinus rhythm Confirmed by Nat Christen [phone]) on 01/02/2019 12:02:31 PM   Radiology Dg Chest Portable 1 View  Result Date: 01/02/2019 CLINICAL DATA:  33 year old with weakness. EXAM: PORTABLE CHEST 1 VIEW COMPARISON:  10/22/2018 FINDINGS: Heart and mediastinum are within normal limits. Trachea is midline. Lungs are clear. Negative for a pneumothorax. Bone structures are unremarkable. IMPRESSION: No acute cardiopulmonary disease. Electronically Signed   By: Markus Daft M.D.   On: 01/02/2019 13:31    Procedures Procedures (including critical care time)  Medications Ordered in ED Medications - No data to display   Initial Impression / Assessment and Plan / ED Course  I have reviewed the triage vital signs and the nursing notes.  Pertinent labs & imaging results that were available during my care of the patient were reviewed by me and considered in my medical decision making (see chart for details).        Patient is hemodynamically stable.  Will obtain CBC, chemistry, urinalysis, TSH.   Patient rechecked prior to discharge.  Discussed test results.  Stable for outpatient management.  No red flags identified. Final Clinical Impressions(s) / ED Diagnoses   Final diagnoses:  Weakness    ED Discharge Orders    None       Nat Christen, MD 01/02/19 1056    Nat Christen, MD 01/02/19 1416

## 2019-01-20 ENCOUNTER — Telehealth: Payer: Self-pay | Admitting: Family Medicine

## 2019-01-20 NOTE — Telephone Encounter (Signed)
Pt is calling and would like to have a copy of her flu faxed to her employer. Please fax to [phone] [facility] [employer]. [email or web address]

## 2019-01-21 NOTE — Telephone Encounter (Signed)
Spoke with pt. Informed her that she would have to come in and sign ROI in order for [name] to fax her flu imm. To her employer. Pt stated that she doesn't have gas and that she has to go to work tomorrow. I again informed her that to only was is for her to sign the ROI form. Pt didn't not state if she was going to come in today. She than asked about her son Alisa Graff  referral to a Diplomatic Services operational officer and that she had not received anything information from them.  Salvatore Marvel, CMA

## 2019-02-12 ENCOUNTER — Encounter: Payer: Self-pay | Admitting: Family Medicine

## 2019-02-12 ENCOUNTER — Ambulatory Visit (INDEPENDENT_AMBULATORY_CARE_PROVIDER_SITE_OTHER): Payer: Self-pay | Admitting: Family Medicine

## 2019-02-12 ENCOUNTER — Other Ambulatory Visit: Payer: Self-pay

## 2019-02-12 VITALS — BP 124/78 | HR 91

## 2019-02-12 DIAGNOSIS — J449 Chronic obstructive pulmonary disease, unspecified: Secondary | ICD-10-CM

## 2019-02-12 DIAGNOSIS — F419 Anxiety disorder, unspecified: Secondary | ICD-10-CM

## 2019-02-12 DIAGNOSIS — F17200 Nicotine dependence, unspecified, uncomplicated: Secondary | ICD-10-CM

## 2019-02-12 DIAGNOSIS — M79602 Pain in left arm: Secondary | ICD-10-CM

## 2019-02-12 NOTE — Progress Notes (Signed)
Patient Name: Mackenzie Gibson Date of Birth: 07-Apr-1985 Date of Visit: 02/12/19 PCP: Shirley, [name], DO  Chief Complaint: Left arm pain  Subjective: Mackenzie Gibson is a pleasant 33 y.o. with medical history significant for bronchitis, hidradenitis suppurativa, bipolar disorder, anxiety, tobacco use disorder, migraines presenting today for left arm pain.   Left Arm pain Left arm intermittent pain that shoots up into her shoulder and makes her fingers and hands numb a month ago. Associated symptoms are tingling lips occasional dizzy spells as well as sternal pain that only occurs when she presses on it and a rash that comes and goes.  She also has no she dated migraines which are twice a week.  Has some lightheadedness but denies shortness of breath and chest tightness.  Her last episode she said she took her oxygen which was 90 and pulse was 120 she tried to sit down and calm herself down and heart rate was still 120.  Her concern is that she does not want to have heart problems and wants to know if there is a test for blood clots in her arm. When questioned further she said that she was hospitalized a month ago in any pain but did not have any IVs.  No injury or trauma to the left arm.  For the arm pain she has tried ibuprofen and Motrin which does not help. She says with smoking the pain is worse and when she does not smoke the pain is not as bad.  But she smokes for her anxiety.  She is no longer taking Flexeril as this did not help the pain.  Bronchitis Was seen at [facility], ED 1 month ago for cough and bronchitis was given azithromycin and prednisone.  Patient said after steroids and antibiotics she felt funny and continues to feel funny intermittently.  No shortness of breath no chest tightness still with some phlegm but otherwise has resolved.  Anxiety Says that a month and a half ago she had a move and recognizes that she has a stress in her life. Has 4 children at home and she stays  home with him in school is starting back so that has become stressful.  She also says that her boyfriend is stressing her out as well.    Tobacco Use disorder Current smoker used to smoke 1 pack/day now she is down to 10 to 12 cigarettes.  Says that smoking helps her anxiety because she is skeptical of medicine.  She would rather smoke then take anxiety medication at this time.   ROS: Per HPI.   I have reviewed the patient's medical, surgical, family, and social history as appropriate.  Vitals:   02/12/19 1617  BP: 124/78  Pulse: 91  SpO2: 98%   General: Appears well, no acute distress. Age appropriate.  Sitting in chair staring off at the wall. Cardiac: RRR, normal heart sounds, no murmurs Respiratory: CTAB, normal effort Extremities: No edema or cyanosis.  Left upper extremity tenderness, with chain like semi-mobile lumps under skin that are tender but have the ability to decompress under pressure, normal range of motion and strength. Skin: Warm and dry, no rashes noted Neuro: alert and oriented, no focal deficits Psych: normal affect  Left arm pain Last seen for this issue in September.  Was thought to be hypertonic musculature and was prescribed Flexeril.  Patient is having continued intermittent pain without resolution with Flexeril.  Appears to be no skin changes but potentially hypertonic biceps on the left. With  running my hand over the skin I do appreciate some bumpiness skin in a grouped/string like fashion. No reason for a picture for her simply because the skin is not visually deformed there is no rash present. Does not appear to be infectious such as an abscess. Patient w/ h/o hidradenitis suppurativa but this is not consistent with that. 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  Although unlikely something to consider in smoking cessation will help stop the progression.  Platelets wnl on CBC. WBC 12.7 increased from  recent prednisone use although decreased from last 1 month ago 15.2 during acute bronchitis. Electrolyte panel wnl. If condition continues consider work-up for Buerger's syndrome. With her tobacco use disorder history as well as arm pain and lumps under the skin would keep this on my differential. -CBC w/ diff -Electrolyte panel -Smoking cessation -Tylenol as needed for pain -Follow-up if symptoms worsen or fail to improve -Consider Buerger's syndrome work-up though smoking cessation will stop the progression of disease.  Anxiety Likely life stress induced.  Increased heart rate 91.  Talking very fast and fidgeting with hands worried she may be experiencing a heart attack or stroke.  Understand she has anxiety and that her heart rate goes up sometimes. Takes baths and smokes cigarettes for her anxiety. Does not want medication at this time. PHQ 2 score is 0 and GAD-7 score is 5.  Needs better coping mechanisms as well as support at home with children.  Patient offered discussion about all management but does not want medication at this time wants to continue with cigarettes. -Continue to assess GAD-7 score -Counsel on anxiety medications at subsequent visits -Follow-up as needed   Asthmatic bronchitis , chronic ([place]) Seen in any pain in the ED 1 month ago was treated with azithromycin and prednisone.  Done with steroid and antibiotics.  Patient feels better with continued mucus production.  Lungs are clear bilaterally.  No coughing or congestion. -Continue albuterol as needed -Continue Qvar daily -Follow-up as needed  Tobacco use disorder Continues to smoke nicotine does not have a desire to stop at this time believes that smoking helps with her anxiety but understands that can be detrimental and she has increased left arm pain with smoking. -Counseled on smoking cessation -Consider exploring other options for cessation at subsequent visits  Return to care if symptoms worsen or fail to  improve.   Gerlene Fee, [facility] PGY-1   *Discussed patient with Dr. Phill Myron

## 2019-02-12 NOTE — Patient Instructions (Addendum)
It was very nice to meet you today. Please enjoy the rest of your week. Today you were seen for left are pain. We got labwork, they will come to your Mychart. In the meantime please use a heating pad and tylenol for pain relief. Follow up as needed or sooner if needed.   Please call the clinic at [phone] if your symptoms worsen or you have any concerns. It was our pleasure to serve you.

## 2019-02-13 DIAGNOSIS — F419 Anxiety disorder, unspecified: Secondary | ICD-10-CM | POA: Insufficient documentation

## 2019-02-13 LAB — CBC WITH DIFFERENTIAL/PLATELET
Basophils Absolute: 0.1 10*3/uL (ref 0.0–0.2)
Basos: 1 %
EOS (ABSOLUTE): 0.5 10*3/uL — ABNORMAL HIGH (ref 0.0–0.4)
Eos: 4 %
Hematocrit: 41.7 % (ref 34.0–46.6)
Hemoglobin: 14.1 g/dL (ref 11.1–15.9)
Immature Grans (Abs): 0.1 10*3/uL (ref 0.0–0.1)
Immature Granulocytes: 1 %
Lymphocytes Absolute: 2.9 10*3/uL (ref 0.7–3.1)
Lymphs: 23 %
MCH: 27.6 pg (ref 26.6–33.0)
MCHC: 33.8 g/dL (ref 31.5–35.7)
MCV: 82 fL (ref 79–97)
Monocytes Absolute: 0.7 10*3/uL (ref 0.1–0.9)
Monocytes: 6 %
Neutrophils Absolute: 8.4 10*3/uL — ABNORMAL HIGH (ref 1.4–7.0)
Neutrophils: 65 %
Platelets: 216 10*3/uL (ref 150–450)
RBC: 5.11 x10E6/uL (ref 3.77–5.28)
RDW: 13.7 % (ref 11.7–15.4)
WBC: 12.7 10*3/uL — ABNORMAL HIGH (ref 3.4–10.8)

## 2019-02-13 LAB — ELECTROLYTE PANEL
CO2: 22 mmol/L (ref 20–29)
Chloride: 106 mmol/L (ref 96–106)
Potassium: 4.2 mmol/L (ref 3.5–5.2)
Sodium: 141 mmol/L (ref 134–144)

## 2019-02-13 NOTE — Assessment & Plan Note (Signed)
>>  ASSESSMENT AND PLAN FOR TOBACCO USE DISORDER WRITTEN ON 02/13/2019  5:34 PM BY AUTRY-LOTT, SIMONE, DO  Continues to smoke nicotine does not have a desire to stop at this time believes that smoking helps with her anxiety but understands that can be detrimental and she has increased left arm pain with smoking. -Counseled on smoking cessation -Consider exploring other options for cessation at subsequent visits

## 2019-02-13 NOTE — Assessment & Plan Note (Signed)
Seen in any pain in the ED 1 month ago was treated with azithromycin and prednisone.  Done with steroid and antibiotics.  Patient feels better with continued mucus production.  Lungs are clear bilaterally.  No coughing or congestion. -Continue albuterol as needed -Continue Qvar daily -Follow-up as needed

## 2019-02-13 NOTE — Assessment & Plan Note (Addendum)
Likely life stress induced.  Increased heart rate 91.  Talking very fast and fidgeting with hands worried she may be experiencing a heart attack or stroke.  Understand she has anxiety and that her heart rate goes up sometimes. Takes baths and smokes cigarettes for her anxiety. Does not want medication at this time. PHQ 2 score is 0 and GAD-7 score is 5.  Needs better coping mechanisms as well as support at home with children.  Patient offered discussion about all management but does not want medication at this time wants to continue with cigarettes. -Continue to assess GAD-7 score -Counsel on anxiety medications at subsequent visits -Follow-up as needed

## 2019-02-13 NOTE — Assessment & Plan Note (Signed)
Continues to smoke nicotine does not have a desire to stop at this time believes that smoking helps with her anxiety but understands that can be detrimental and she has increased left arm pain with smoking. -Counseled on smoking cessation -Consider exploring other options for cessation at subsequent visits

## 2019-02-13 NOTE — Assessment & Plan Note (Addendum)
Last seen for this issue in September.  Was thought to be hypertonic musculature and was prescribed Flexeril.  Patient is having continued intermittent pain without resolution with Flexeril.  Appears to be no skin changes but potentially hypertonic biceps on the left. With running my hand over the skin I do appreciate some bumpiness skin in a grouped/string like fashion. No reason for a picture for her simply because the skin is not visually deformed there is no rash present. Does not appear to be infectious such as an abscess. Patient w/ h/o hidradenitis suppurativa but this is not consistent with that. 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  Although unlikely something to consider in smoking cessation will help stop the progression.  Platelets wnl on CBC. WBC 12.7 increased from recent prednisone use although decreased from last 1 month ago 15.2 during acute bronchitis. Electrolyte panel wnl. If condition continues consider work-up for Buerger's syndrome. With her tobacco use disorder history as well as arm pain and lumps under the skin would keep this on my differential. -CBC w/ diff -Electrolyte panel -Smoking cessation -Tylenol as needed for pain -Follow-up if symptoms worsen or fail to improve -Consider Buerger's syndrome work-up though smoking cessation will stop the progression of disease.

## 2019-02-15 ENCOUNTER — Telehealth: Payer: Self-pay | Admitting: Family Medicine

## 2019-02-15 ENCOUNTER — Encounter: Payer: Self-pay | Admitting: Family Medicine

## 2019-02-15 NOTE — Progress Notes (Signed)
Sent letter to my chart.

## 2019-02-15 NOTE — Telephone Encounter (Signed)
Patient seen by Autry-Lott on 02-12-2019. Patient says she 'laid out of work on Thursday 02-11-2019 for the reason she was seen on Friday and needs a doctors note excusing her from work Thursday and Friday so she does not get fired for laying out on Thursday.' Patient also said she was 'taking care of her sick daughter on Thursday, who was not seen at our clinic.'   Patient wants this note sent to her MyChart today as she returns to work tomorrow.

## 2019-02-15 NOTE — Telephone Encounter (Signed)
Will forward to Dr. Janus Molder who saw patient for this visit. Cloee Dunwoody,CMA

## 2019-02-15 NOTE — Telephone Encounter (Signed)
Sent letter to my chart excusing her on 02/12/2019 for the day she came to the clinic.

## 2019-04-23 DIAGNOSIS — B349 Viral infection, unspecified: Secondary | ICD-10-CM | POA: Diagnosis not present

## 2019-04-23 DIAGNOSIS — R079 Chest pain, unspecified: Secondary | ICD-10-CM | POA: Diagnosis not present

## 2019-05-26 ENCOUNTER — Ambulatory Visit (INDEPENDENT_AMBULATORY_CARE_PROVIDER_SITE_OTHER): Payer: Medicaid Other | Admitting: Family Medicine

## 2019-05-26 ENCOUNTER — Other Ambulatory Visit: Payer: Self-pay

## 2019-05-26 ENCOUNTER — Encounter: Payer: Self-pay | Admitting: Family Medicine

## 2019-05-26 ENCOUNTER — Other Ambulatory Visit (HOSPITAL_COMMUNITY)
Admission: RE | Admit: 2019-05-26 | Discharge: 2019-05-26 | Disposition: A | Discharge status: Home / Self Care | Payer: Medicaid Other | Source: Ambulatory Visit | Attending: Family Medicine | Admitting: Family Medicine

## 2019-05-26 VITALS — BP 128/70 | HR 100 | Ht 67.0 in | Wt 293.0 lb

## 2019-05-26 DIAGNOSIS — L732 Hidradenitis suppurativa: Secondary | ICD-10-CM | POA: Diagnosis not present

## 2019-05-26 DIAGNOSIS — R102 Pelvic and perineal pain: Secondary | ICD-10-CM | POA: Diagnosis not present

## 2019-05-26 DIAGNOSIS — N938 Other specified abnormal uterine and vaginal bleeding: Secondary | ICD-10-CM | POA: Insufficient documentation

## 2019-05-26 DIAGNOSIS — N898 Other specified noninflammatory disorders of vagina: Secondary | ICD-10-CM

## 2019-05-26 DIAGNOSIS — G8929 Other chronic pain: Secondary | ICD-10-CM

## 2019-05-26 DIAGNOSIS — R87612 Low grade squamous intraepithelial lesion on cytologic smear of cervix (LGSIL): Secondary | ICD-10-CM

## 2019-05-26 LAB — POCT WET PREP (WET MOUNT)
Clue Cells Wet Prep Whiff POC: NEGATIVE
Trichomonas Wet Prep HPF POC: ABSENT
WBC, Wet Prep HPF POC: >20

## 2019-05-26 MED ORDER — CLINDAMYCIN PHOSPHATE 1 % EX SOLN: Freq: Two times a day (BID) | CUTANEOUS | 2 refills | Status: DC

## 2019-05-26 NOTE — Patient Instructions (Signed)
Thank you for coming to see me today. It was a pleasure! Today we talked about:   For the hidradenitis suppurativa or the areas on your skin, I think at this point you would benefit from seeing a dermatologist as they have different medications that they can prescribe.  I have placed a referral for dermatology and you should hear from them within the next 2 weeks and if you do not then please let us know.  I have refilled her clindamycin cream that you can continue to use.  For your irregular bleeding and pain with intercourse, I have placed a referral to OB/GYN.  I will update you with your Pap and STD results on MyChart and call you if there is any abnormalities.  Please follow-up with me in 1 month or sooner as needed, may do a virtual visit.  If you have any questions or concerns, please do not hesitate to call the office at [phone].  Take Care,   [name] Laurens Matheny, DO

## 2019-05-26 NOTE — Progress Notes (Signed)
   SUBJECTIVE:   CHIEF COMPLAINT / HPI:   Pelvic pain  Vaginal discharge: Patient reports pain during intercourse has gotten worse. She reports irregular periods have continued. She is here for repeat pap smear after having a colpo 1 year ago which for ASCUS and HPV +. No bx was taken. She has had a tubal ligation in 2016. She denies any nausea, change in bowel habits.   Hidradenitis suppurativa: -Patient reports that she is continuing to have the bumps/boils in several places on her body. She states that they are increasing around her belly. She has tried chlorhexidine wash with minimal improvement.   -She states that lesions are in her groin, armpits and underneath her breast. Reports that some areas will often drain. Sometimes they are bloody but this is after she picks it. She does not like the look of it as they often cause scarring.   -She has started to gain back some weight that she has lost given increased stress at home. She has also continued to smoke.   -She denies any fevers, chills.  -She did not notice any improvement after completing the doxycycline. The cleocin T cream helped some and she would like a refill  PERTINENT  PMH / PSH: PCOS, folliculitis/hidradenitissuppurativa,and obesity, Bipolar disorder  OBJECTIVE:  BP 128/70   Pulse 100   Ht 5\' 7"  (1.702 m)   Wt 293 lb (132.9 kg)   LMP 05/07/2019 (Exact Date)   SpO2 98%   BMI 45.89 kg/m   General: NAD, pleasant Neck: Supple, no LAD Respiratory: normal work of breathing Gastrointestinal: soft, nontender, nondistended GU/GYN: External genitalia within normal limits. Vaginal mucosa pink, moist, normal rugae.  Nonfriable cervix without lesions, no discharge or bleeding noted on speculum exam.  Bimanual exam revealed some palpable fibroids. No cervical motion tenderness. No adnexal masses bilaterally. Exam performed in the presence of a chaperone.  ASSESSMENT/PLAN:   Hidradenitis suppurativa Patient provided with  mroeclindamycin cream to apply to skin and counseled on trying not to pick at them.  Patient also advised to wear loose clothing.  Counseled on the importance of weight loss along with smoking cessation for this to improve long-term. -given continued issue will refer to dermatology due to extent   Low grade squamous intraepith lesion on cytologic smear cervix (lgsil) Colposcopy on 04/30/2018 with no bx.  Repeat pap smear performed today with LSIL on cytology as well as continued HPV positive. Patient has referral for OB/GYN for other symptoms of fibroids as well as dyspareunia but patient would like colpo done in our clinic as previously. Schedule for 4/1 at 11:00am.   Chronic pelvic pain in female Patient with vaginal discharge and pain with intercourse. Previous pelvic US showed fibroids, but sx are worsening. Will refer to OB/GYN. Papsmear as above. Wet prep with no signs of BV, trich, yeast and GC/chlamydia negative.    [name] Zale Marcotte, DO PGY-3, [facility]

## 2019-05-27 LAB — CBC WITH DIFFERENTIAL/PLATELET
Basophils Absolute: 0.1 10*3/uL (ref 0.0–0.2)
Basos: 1 %
EOS (ABSOLUTE): 0.4 10*3/uL (ref 0.0–0.4)
Eos: 4 %
Hematocrit: 43.4 % (ref 34.0–46.6)
Hemoglobin: 14.3 g/dL (ref 11.1–15.9)
Immature Grans (Abs): 0.1 10*3/uL (ref 0.0–0.1)
Immature Granulocytes: 1 %
Lymphocytes Absolute: 2.4 10*3/uL (ref 0.7–3.1)
Lymphs: 21 %
MCH: 26.9 pg (ref 26.6–33.0)
MCHC: 32.9 g/dL (ref 31.5–35.7)
MCV: 82 fL (ref 79–97)
Monocytes Absolute: 0.6 10*3/uL (ref 0.1–0.9)
Monocytes: 5 %
Neutrophils Absolute: 7.8 10*3/uL — ABNORMAL HIGH (ref 1.4–7.0)
Neutrophils: 68 %
Platelets: 218 10*3/uL (ref 150–450)
RBC: 5.32 x10E6/uL — ABNORMAL HIGH (ref 3.77–5.28)
RDW: 14 % (ref 11.7–15.4)
WBC: 11.3 10*3/uL — ABNORMAL HIGH (ref 3.4–10.8)

## 2019-05-27 LAB — CYTOLOGY - PAP
Chlamydia: NEGATIVE
Comment: NEGATIVE
Comment: NEGATIVE
Comment: NORMAL
High risk HPV: POSITIVE — AB
Neisseria Gonorrhea: NEGATIVE

## 2019-05-27 LAB — HEPATITIS C ANTIBODY: Hep C Virus Ab: <0.1 s/co ratio (ref 0.0–0.9)

## 2019-05-27 LAB — HIV ANTIBODY (ROUTINE TESTING W REFLEX): HIV Screen 4th Generation wRfx: NONREACTIVE

## 2019-05-27 LAB — HEMOGLOBIN A1C
Est. average glucose Bld gHb Est-mCnc: 94 mg/dL
Hgb A1c MFr Bld: 4.9 % (ref 4.8–5.6)

## 2019-05-28 ENCOUNTER — Encounter: Payer: Self-pay | Admitting: Family Medicine

## 2019-05-28 ENCOUNTER — Telehealth: Payer: Self-pay

## 2019-05-28 NOTE — Telephone Encounter (Signed)
Patient LVM on nurse line stating she has reviewed her results on mychart and wants to discuss next steps. I am assuming a Colpo? Happy to call her to discuss and schedule her in [facility].

## 2019-05-30 NOTE — Assessment & Plan Note (Addendum)
Patient with vaginal discharge and pain with intercourse. Previous pelvic US showed fibroids, but sx are worsening. Will refer to OB/GYN. Papsmear as above. Wet prep with no signs of BV, trich, yeast and GC/chlamydia negative.

## 2019-05-30 NOTE — Assessment & Plan Note (Signed)
Colposcopy on 04/30/2018 with no bx.  Repeat pap smear performed today with LSIL on cytology as well as continued HPV positive. Patient has referral for OB/GYN for other symptoms of fibroids as well as dyspareunia but patient would like colpo done in our clinic as previously. Schedule for 4/1 at 11:00am.

## 2019-05-30 NOTE — Assessment & Plan Note (Signed)
Patient provided with mroeclindamycin cream to apply to skin and counseled on trying not to pick at them.  Patient also advised to wear loose clothing.  Counseled on the importance of weight loss along with smoking cessation for this to improve long-term. -given continued issue will refer to dermatology due to extent

## 2019-06-01 NOTE — Telephone Encounter (Signed)
Discussed results over MyChart and patient scheduled for colpo clinic and also has referral for OB/GYN

## 2019-06-03 ENCOUNTER — Ambulatory Visit (INDEPENDENT_AMBULATORY_CARE_PROVIDER_SITE_OTHER): Payer: Medicaid Other | Admitting: Family Medicine

## 2019-06-03 ENCOUNTER — Other Ambulatory Visit: Payer: Self-pay

## 2019-06-03 VITALS — BP 128/82 | HR 91 | Ht 67.0 in | Wt 292.6 lb

## 2019-06-03 DIAGNOSIS — R87619 Unspecified abnormal cytological findings in specimens from cervix uteri: Secondary | ICD-10-CM | POA: Diagnosis not present

## 2019-06-03 NOTE — Assessment & Plan Note (Signed)
Clinically normal colposcopy 2021 Recommend repeat pap with cotesting next year and could consider adding subtype analysius of HPV for 16/18/45.

## 2019-06-03 NOTE — Progress Notes (Signed)
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 Her PCP has set her up with an OBGYN appt. She would also like to discuss with the OBGYN possibility of a hysterectomy as she is concerned about her FH of ovarian cancer.)Mother and MGM had cervical cancer. Another female relative had ovarian cancer). She has dx of PCOS. She has 4 children.  Patient given informed consent, signed copy in the chart.  Placed in lithotomy position. Cervix viewed with speculum and colposcope after application of acetic acid.  Vaginal sidewall retractors were needed for adequate visualization.  Colposcopy adequate (entire squamocolumnar junctions seen  in entirety) ?  Yes easily Acetowhite lesions? none Punctation? none Mosaicism?  none Abnormal vasculature?  npne Biopsies? none ECC? no Complications? no  COMMENTS:  Patient was given post procedure instructions.    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 Recommend repeat pap with cotesting next year and could consider adding subtype analysius of HPV for 16/18/45.  I discussed with her and reassured her of normal colposcopic findings. She has a lot of concerns about cancer in relation to her reported FH of cervical and ovarian cancers. She has already been set up for GYN evaluation for her bleeding so I will let them discuss work up for her intermenstrual bleeding and she can talk with them re her desire for hysterectomy,

## 2019-06-29 DIAGNOSIS — L732 Hidradenitis suppurativa: Secondary | ICD-10-CM | POA: Diagnosis not present

## 2019-07-12 DIAGNOSIS — R062 Wheezing: Secondary | ICD-10-CM | POA: Diagnosis not present

## 2019-07-12 DIAGNOSIS — R079 Chest pain, unspecified: Secondary | ICD-10-CM | POA: Diagnosis not present

## 2019-07-12 DIAGNOSIS — R05 Cough: Secondary | ICD-10-CM | POA: Diagnosis not present

## 2019-07-15 ENCOUNTER — Telehealth: Payer: Self-pay

## 2019-07-15 NOTE — Telephone Encounter (Signed)
Patient calls nurse line regarding productive cough (clear, thick) with intermittent shortness of breath with exertion. Patient denies fever, body aches, fatigue or changes in smell or taste. Patient was seen in respiratory clinic on 5/10, COVID negative.   Patient states symptoms are not getting any better.   Spoke with Dr. Owens Shark regarding patient, advised that patient have office visit for evaluation. ATC appointment made for tomorrow AM.   Strict ED precautions given.   Talbot Grumbling, RN

## 2019-07-16 ENCOUNTER — Ambulatory Visit (INDEPENDENT_AMBULATORY_CARE_PROVIDER_SITE_OTHER): Payer: Medicaid Other | Admitting: Family Medicine

## 2019-07-16 ENCOUNTER — Other Ambulatory Visit: Payer: Self-pay

## 2019-07-16 DIAGNOSIS — J069 Acute upper respiratory infection, unspecified: Secondary | ICD-10-CM

## 2019-07-16 MED ORDER — SALINE SPRAY 0.65 % NA SOLN: 1.0000 | NASAL | 1 refills | Status: DC | PRN

## 2019-07-16 MED ORDER — BENZONATATE 200 MG PO CAPS: 200.0000 mg | ORAL_CAPSULE | Freq: Three times a day (TID) | ORAL | 0 refills | Status: DC | PRN

## 2019-07-16 NOTE — Patient Instructions (Signed)
Believe that your symptoms are due to a cold.  You are currently on a steroid burst for the wheezing heard on exam.  I would use your albuterol inhaler as needed for wheeze or cough.  I also recommend that you restart the doxycycline.  If you have any pneumonia that we are not able to get here on exam or seen on a chest x-ray, doxycycline will help treat that.  You should begin to feel better in a few days.  If you do not improve by the end of next week then do not hesitate to return to our office or go to the emergency room if you worsen over the weekend.  For your cough, try honey scheduled; may use in tea.   For your nasal congestion and runny nose, try using Afrin (generic is Oxymetazoline) twice daily for 3 days.  Do not use for longer that 3 days.   You may also try a nasal saline spray such as Ocean Spray in order to help break up the mucus in your nose.   You may try guaifenesin (Mucinex) to help with the congestion.    Some other therapies you can try are: push fluids, rest, gargle warm salt water, quit smoking and return office visit prn if symptoms persist or worsen.   Drinking warm liquids such as teas and soups can help with secretions and cough.  Be sure to drink plenty of fluids. A mist humidifier or vaporizer can work well to help with secretions and cough.  It is very important to clean the humidifier between use according to the instructions.    It was good to see you.  If you're still having trouble in the next week, come back and see [name].    Of course, if you start having trouble breathing, worsening fevers, vomiting and unable to hold down any fluids, or you have other concerns, don't hesitate to come back or go to the ED after hours.

## 2019-07-16 NOTE — Progress Notes (Signed)
   SUBJECTIVE:   CHIEF COMPLAINT / HPI:   Hidradenitis suppurativa: Patient was seen  by dermatology and was given cream and body wash and doxycycline twice per day. She stopped taking the doxycycline as she did not know if it was causing her respiratory symptoms as below.  Viral URI Tested twice for covid and both were negative.  Patient reports that she had a fever off and on of 101 and then had a cough and wen to ED. She had a CXR at [facility] Monday.  In the ED, they gave prednisone and shot of steroid as well as prednisone. She went back home rested and has been coughing a ton and now it is worse. She is having severe SOB on exertion. She also has sore throat and trouble swallowing due to pain. Fevers stopped Tuesday. No other sick contacts. Has not had a cigarette in 5 days. Diarrhea but it could be from from the doxycycline. She has had some runny nose. No swelling in her legs.   PERTINENT  PMH / PSH: PCOS, folliculitis/hidradenitissuppurativa,and obesity, Bipolar disorder  OBJECTIVE:  BP 118/62   Pulse 85   Ht 5\' 7"  (1.702 m)   Wt 287 lb 3.2 oz (130.3 kg)   LMP 07/02/2019 (Exact Date)   SpO2 97%   BMI 44.98 kg/m   Ambulation with pulse ox: 97%  General: NAD, pleasant Neck: Supple Respiratory: normal work of breathing Psych: AOx3, appropriate affect  ASSESSMENT/PLAN:   Viral URI with cough Covid negative x2.  CXR WNL in ED.  Patient with continued symptoms but they are improving.  Currently on prednisone and doxycycline for hidradenitis suppurativa which would treat any sort of infection or possible exacerbation that she is having.  Patient does have some minimal wheezing on exam with no history of asthma but has albuterol inhaler with her.  Only desatted to 97% with ambulation in the hallway.  Patient reassured and is to return if she is not feeling better by next week.    [name] Davison Ohms, DO PGY-3, [facility]

## 2019-07-19 ENCOUNTER — Encounter: Payer: Self-pay | Admitting: Obstetrics and Gynecology

## 2019-07-19 ENCOUNTER — Other Ambulatory Visit: Payer: Self-pay

## 2019-07-19 ENCOUNTER — Ambulatory Visit (INDEPENDENT_AMBULATORY_CARE_PROVIDER_SITE_OTHER): Payer: Medicaid Other | Admitting: Obstetrics and Gynecology

## 2019-07-19 VITALS — BP 126/71 | HR 81 | Ht 67.0 in | Wt 288.0 lb

## 2019-07-19 DIAGNOSIS — R102 Pelvic and perineal pain: Secondary | ICD-10-CM | POA: Diagnosis not present

## 2019-07-19 DIAGNOSIS — D259 Leiomyoma of uterus, unspecified: Secondary | ICD-10-CM

## 2019-07-19 DIAGNOSIS — N938 Other specified abnormal uterine and vaginal bleeding: Secondary | ICD-10-CM | POA: Diagnosis not present

## 2019-07-19 NOTE — Patient Instructions (Signed)
Vaginal Hysterectomy, Care After Refer to this sheet in the next few weeks. These instructions provide you with information about caring for yourself after your procedure. Your health care provider may also give you more specific instructions. Your treatment has been planned according to current medical practices, but problems sometimes occur. Call your health care provider if you have any problems or questions after your procedure. What can I expect after the procedure? After the procedure, it is common to have:  Pain.  Soreness and numbness in your incision areas.  Vaginal bleeding and discharge.  Constipation.  Temporary problems emptying the bladder.  Feelings of sadness or other emotions. Follow these instructions at home: Medicines  Take over-the-counter and prescription medicines only as told by your health care provider.  If you were prescribed an antibiotic medicine, take it as told by your health care provider. Do not stop taking the antibiotic even if you start to feel better.  Do not drive or operate heavy machinery while taking prescription pain medicine. Activity  Return to your normal activities as told by your health care provider. Ask your health care provider what activities are safe for you.  Get regular exercise as told by your health care provider. You may be told to take short walks every day and go farther each time.  Do not lift anything that is heavier than 10 lb (4.5 kg). General instructions   Do not put anything in your vagina for 6 weeks after your surgery or as told by your health care provider. This includes tampons and douches.  Do not have sex until your health care provider says you can.  Do not take baths, swim, or use a hot tub until your health care provider approves.  Drink enough fluid to keep your urine clear or pale yellow.  Do not drive for 24 hours if you were given a sedative.  Keep all follow-up visits as told by your health  care provider. This is important. Contact a health care provider if:  Your pain medicine is not helping.  You have a fever.  You have redness, swelling, or pain at your incision site.  You have blood, pus, or a bad-smelling discharge from your vagina.  You continue to have difficulty urinating. Get help right away if:  You have severe abdominal or back pain.  You have heavy bleeding from your vagina.  You have chest pain or shortness of breath. This information is not intended to replace advice given to you by your health care provider. Make sure you discuss any questions you have with your health care provider. Document Revised: 10/12/2015 Document Reviewed: 03/05/2015 Elsevier Patient Education  2020 [place]. Vaginal Hysterectomy  A vaginal hysterectomy is a procedure to remove all or part of the uterus through a small incision in the vagina. In this procedure, your health care provider may remove your entire uterus, including the lower end (cervix). You may need a vaginal hysterectomy to treat:  Uterine fibroids.  A condition that causes the lining of the uterus to grow in other areas (endometriosis).  Problems with pelvic support.  Cancer of the cervix, ovaries, uterus, or tissue that lines the uterus (endometrium).  Excessive (dysfunctional) uterine bleeding. When removing your uterus, your health care provider may also remove the organs that produce eggs (ovaries) and the tubes that carry eggs to your uterus (fallopian tubes). After a vaginal hysterectomy, you will no longer be able to have a baby. You will also no longer get your  menstrual period. Tell a health care provider about:  Any allergies you have.  All medicines you are taking, including vitamins, herbs, eye drops, creams, and over-the-counter medicines.  Any problems you or family members have had with anesthetic medicines.  Any blood disorders you have.  Any surgeries you have had.  Any medical  conditions you have.  Whether you are pregnant or may be pregnant. What are the risks? Generally, this is a safe procedure. However, problems may occur, including:  Bleeding.  Infection.  A blood clot that forms in your leg and travels to your lungs (pulmonary embolism).  Damage to surrounding organs.  Pain during sex. What happens before the procedure?  Ask your health care provider what organs will be removed during surgery.  Ask your health care provider about: ? Changing or stopping your regular medicines. This is especially important if you are taking diabetes medicines or blood thinners. ? Taking medicines such as aspirin and ibuprofen. These medicines can thin your blood. Do not take these medicines before your procedure if your health care provider instructs you not to.  Follow instructions from your health care provider about eating or drinking restrictions.  Do not use any tobacco products, such as cigarettes, chewing tobacco, and e-cigarettes. If you need help quitting, ask your health care provider.  Plan to have someone take you home after discharge from the hospital. What happens during the procedure?  To reduce your risk of infection: ? Your health care team will wash or sanitize their hands. ? Your skin will be washed with soap.  An IV tube will be inserted into one of your veins.  You may be given antibiotic medicine to help prevent infection.  You will be given one or more of the following: ? A medicine to help you relax (sedative). ? A medicine to numb the area (local anesthetic). ? A medicine to make you fall asleep (general anesthetic). ? A medicine that is injected into an area of your body to numb everything beyond the injection site (regional anesthetic).  Your surgeon will make an incision in your vagina.  Your surgeon will locate and remove all or part of your uterus.  Your ovaries and fallopian tubes may be removed at the same time.  The  incision will be closed with stitches (sutures) that dissolve over time. The procedure may vary among health care providers and hospitals. What happens after the procedure?  Your blood pressure, heart rate, breathing rate, and blood oxygen level will be monitored often until the medicines you were given have worn off.  You will be encouraged to get up and walk around after a few hours to help prevent complications.  You may have IV tubes in place for a few days.  You will be given pain medicine as needed.  Do not drive for 24 hours if you were given a sedative. This information is not intended to replace advice given to you by your health care provider. Make sure you discuss any questions you have with your health care provider. Document Revised: 10/12/2015 Document Reviewed: 03/05/2015 Elsevier Patient Education  2020 [employer].

## 2019-07-19 NOTE — Progress Notes (Signed)
Mackenzie Gibson presents for follow up of her DUB. Cycles are monthly now without cyclic Provera. She does have pain with her cycles and intercourse. Some BTB as well H/O ? Fibroids H/O abnormal pap smears She desires definite therapy in form of hyst. H/O TSVD x 4 ( largest 8# 3 oz) H/O BTL  PE AF VSS Lungs clear Heart RRR Abd soft + BS  GU nl EGBUS uterus small < 10 weeks mobile slightly tender no adnexal masses or tenderness Exam limited by pt habitus  A/P DUB        Uterine fibroids        Pelvic pain        Dysmenorrhea  Pt desires TVH. Reviewed with pt. Pt informed unable to guarantee that her pain will resolved with surgery. Pt verbalized understanding. Will check GYN U/S.  Hyst papers signed today TVH/BS reviewed with pt. R/B/Post op care. Information provided to pt as well. Will schedule once U/S returns. F/U with post op appt.

## 2019-07-21 NOTE — Assessment & Plan Note (Addendum)
Covid negative x2.  CXR WNL in ED.  Patient with continued symptoms but they are improving.  Currently on prednisone and doxycycline for hidradenitis suppurativa which would treat any sort of infection or possible exacerbation that she is having.  Patient does have some minimal wheezing on exam with no history of asthma but has albuterol inhaler with her.  Only desatted to 97% with ambulation in the hallway.  Patient reassured and is to return if she is not feeling better by next week.

## 2019-07-26 ENCOUNTER — Ambulatory Visit: Admission: RE | Admit: 2019-07-26 | Payer: Medicaid Other | Source: Ambulatory Visit

## 2019-08-03 ENCOUNTER — Other Ambulatory Visit: Payer: Self-pay

## 2019-08-03 ENCOUNTER — Ambulatory Visit
Admission: RE | Admit: 2019-08-03 | Discharge: 2019-08-03 | Disposition: A | Discharge status: Home / Self Care | Payer: Medicaid Other | Source: Ambulatory Visit | Attending: Obstetrics and Gynecology | Admitting: Obstetrics and Gynecology

## 2019-08-03 DIAGNOSIS — N939 Abnormal uterine and vaginal bleeding, unspecified: Secondary | ICD-10-CM | POA: Diagnosis not present

## 2019-08-03 DIAGNOSIS — N938 Other specified abnormal uterine and vaginal bleeding: Secondary | ICD-10-CM

## 2019-08-10 DIAGNOSIS — L732 Hidradenitis suppurativa: Secondary | ICD-10-CM | POA: Diagnosis not present

## 2019-08-19 ENCOUNTER — Encounter: Payer: Self-pay | Admitting: Family Medicine

## 2019-08-19 ENCOUNTER — Ambulatory Visit (INDEPENDENT_AMBULATORY_CARE_PROVIDER_SITE_OTHER): Payer: Medicaid Other | Admitting: Family Medicine

## 2019-08-19 ENCOUNTER — Other Ambulatory Visit: Payer: Self-pay

## 2019-08-19 VITALS — BP 150/100 | HR 90 | Ht 67.0 in | Wt 290.1 lb

## 2019-08-19 DIAGNOSIS — N3001 Acute cystitis with hematuria: Secondary | ICD-10-CM

## 2019-08-19 DIAGNOSIS — R399 Unspecified symptoms and signs involving the genitourinary system: Secondary | ICD-10-CM | POA: Diagnosis not present

## 2019-08-19 DIAGNOSIS — N390 Urinary tract infection, site not specified: Secondary | ICD-10-CM | POA: Insufficient documentation

## 2019-08-19 LAB — POCT URINALYSIS DIP (MANUAL ENTRY)
Bilirubin, UA: NEGATIVE
Glucose, UA: NEGATIVE mg/dL
Ketones, POC UA: NEGATIVE mg/dL
Nitrite, UA: NEGATIVE
Protein Ur, POC: 100 mg/dL — AB
Spec Grav, UA: >=1.03 — AB (ref 1.010–1.025)
Urobilinogen, UA: 0.2 E.U./dL
pH, UA: 6 (ref 5.0–8.0)

## 2019-08-19 LAB — POCT UA - MICROSCOPIC ONLY

## 2019-08-19 MED ORDER — CEPHALEXIN 500 MG PO CAPS
500.0000 mg | ORAL_CAPSULE | Freq: Four times a day (QID) | ORAL | 0 refills | Status: AC
Start: 2019-08-19 — End: 2019-08-26

## 2019-08-19 NOTE — Patient Instructions (Signed)
Urinary Tract Infection, Adult A urinary tract infection (UTI) is an infection of any part of the urinary tract. The urinary tract includes:  The kidneys.  The ureters.  The bladder.  The urethra. These organs make, store, and get rid of pee (urine) in the body. What are the causes? This is caused by germs (bacteria) in your genital area. These germs grow and cause swelling (inflammation) of your urinary tract. What increases the risk? You are more likely to develop this condition if:  You have a small, thin tube (catheter) to drain pee.  You cannot control when you pee or poop (incontinence).  You are female, and: ? You use these methods to prevent pregnancy:  A medicine that kills sperm (spermicide).  A device that blocks sperm (diaphragm). ? You have low levels of a female hormone (estrogen). ? You are pregnant.  You have genes that add to your risk.  You are sexually active.  You take antibiotic medicines.  You have trouble peeing because of: ? A prostate that is bigger than normal, if you are female. ? A blockage in the part of your body that drains pee from the bladder (urethra). ? A kidney stone. ? A nerve condition that affects your bladder (neurogenic bladder). ? Not getting enough to drink. ? Not peeing often enough.  You have other conditions, such as: ? Diabetes. ? A weak disease-fighting system (immune system). ? Sickle cell disease. ? Gout. ? Injury of the spine. What are the signs or symptoms? Symptoms of this condition include:  Needing to pee right away (urgently).  Peeing often.  Peeing small amounts often.  Pain or burning when peeing.  Blood in the pee.  Pee that smells bad or not like normal.  Trouble peeing.  Pee that is cloudy.  Fluid coming from the vagina, if you are female.  Pain in the belly or lower back. Other symptoms include:  Throwing up (vomiting).  No urge to eat.  Feeling mixed up (confused).  Being tired  and grouchy (irritable).  A fever.  Watery poop (diarrhea). How is this treated? This condition may be treated with:  Antibiotic medicine.  Other medicines.  Drinking enough water. Follow these instructions at home:  Medicines  Take over-the-counter and prescription medicines only as told by your doctor.  If you were prescribed an antibiotic medicine, take it as told by your doctor. Do not stop taking it even if you start to feel better. General instructions  Make sure you: ? Pee until your bladder is empty. ? Do not hold pee for a long time. ? Empty your bladder after sex. ? Wipe from front to back after pooping if you are a female. Use each tissue one time when you wipe.  Drink enough fluid to keep your pee pale yellow.  Keep all follow-up visits as told by your doctor. This is important. Contact a doctor if:  You do not get better after 1-2 days.  Your symptoms go away and then come back. Get help right away if:  You have very bad back pain.  You have very bad pain in your lower belly.  You have a fever.  You are sick to your stomach (nauseous).  You are throwing up. Summary  A urinary tract infection (UTI) is an infection of any part of the urinary tract.  This condition is caused by germs in your genital area.  There are many risk factors for a UTI. These include having a small, thin   tube to drain pee and not being able to control when you pee or poop.  Treatment includes antibiotic medicines for germs.  Drink enough fluid to keep your pee pale yellow. This information is not intended to replace advice given to you by your health care provider. Make sure you discuss any questions you have with your health care provider. Document Revised: 02/05/2018 Document Reviewed: 08/28/2017 Elsevier Patient Education  2020 [employer]  

## 2019-08-19 NOTE — Progress Notes (Signed)
    SUBJECTIVE:   CHIEF COMPLAINT / HPI:   Dysuria Patient reports symptoms started yesterday. Felt nauseous throughout the day. Now having dysuria. Also reports urgency. Denies frequency. Has not drank much today for fear of peeing due to pain. Reports some pelvic pain, but is due for a hysterectomy in August. Does report some mild hematuria. Had a UTI years ago but cannot remember what it felt like. Does report some constipation. Has IBS. No vaginal discharge. Does report bilateral back pain. No fevers.   Of note on doxycyline but has been out of medications for 2 weeks.   PERTINENT  PMH / PSH: hidradenitis suppurativa  OBJECTIVE:   BP (!) 150/100 Comment: provider informed  Pulse 90   Ht 5\' 7"  (1.702 m)   Wt 290 lb 2 oz (131.6 kg)   LMP 08/02/2019   SpO2 97%   BMI 45.44 kg/m   Gen: awake and alert, NAD Cardio: RRR, no MRG Resp: CTAB, no wheezes, rales or rhonchi MSK: no CVA tenderness GI: soft, non tender, non   ASSESSMENT/PLAN:   UTI (urinary tract infection) Patient symptoms and UA consistent with UTI.  Will give Rx for Keflex x7 days.  Advised to follow-up in 2 weeks for repeat UA to ensure that hematuria is resolved.  Patient to follow-up sooner if no improvement in symptoms.  ED precautions discussed.     Caroline More, [place]

## 2019-08-19 NOTE — Assessment & Plan Note (Signed)
Patient symptoms and UA consistent with UTI.  Will give Rx for Keflex x7 days.  Advised to follow-up in 2 weeks for repeat UA to ensure that hematuria is resolved.  Patient to follow-up sooner if no improvement in symptoms.  ED precautions discussed.

## 2019-08-31 ENCOUNTER — Encounter: Payer: Self-pay | Admitting: Family Medicine

## 2019-08-31 ENCOUNTER — Ambulatory Visit (INDEPENDENT_AMBULATORY_CARE_PROVIDER_SITE_OTHER): Payer: Medicaid Other | Admitting: Family Medicine

## 2019-08-31 ENCOUNTER — Other Ambulatory Visit: Payer: Self-pay

## 2019-08-31 VITALS — BP 122/80 | HR 88 | Ht 67.0 in | Wt 296.4 lb

## 2019-08-31 DIAGNOSIS — N3001 Acute cystitis with hematuria: Secondary | ICD-10-CM | POA: Diagnosis not present

## 2019-08-31 DIAGNOSIS — R300 Dysuria: Secondary | ICD-10-CM | POA: Diagnosis not present

## 2019-08-31 LAB — POCT URINALYSIS DIP (MANUAL ENTRY)
Bilirubin, UA: NEGATIVE
Blood, UA: NEGATIVE
Glucose, UA: NEGATIVE mg/dL
Ketones, POC UA: NEGATIVE mg/dL
Leukocytes, UA: NEGATIVE
Nitrite, UA: NEGATIVE
Protein Ur, POC: NEGATIVE mg/dL
Spec Grav, UA: 1.025 (ref 1.010–1.025)
Urobilinogen, UA: 0.2 E.U./dL
pH, UA: 6 (ref 5.0–8.0)

## 2019-08-31 MED ORDER — CEPHALEXIN 500 MG PO CAPS
500.0000 mg | ORAL_CAPSULE | Freq: Four times a day (QID) | ORAL | 0 refills | Status: AC
Start: 2019-08-31 — End: 2019-09-08

## 2019-08-31 NOTE — Patient Instructions (Signed)
Thank you for coming to see me today. It was a pleasure! Today we talked about:   For your symptoms of urinary tract infection, I have sent an antibiotic to your pharmacy.  Please take the Keflex if you have continued symptoms over the next few days.  We have gotten a urine culture on you which will result in the next few days to show [name] if you have bacteria growing in your urine and if the antibiotics we have given we will treat that.  If you are feeling well then there is no need to take the medication.  Please follow-up with our clinic if your symptoms persist or worsen or sooner as needed.  If you have any questions or concerns, please do not hesitate to call the office at [phone].  Take Care,   [name] Ji Feldner, DO

## 2019-08-31 NOTE — Assessment & Plan Note (Signed)
Patient symptoms c/w  UTI, but could also be symptoms continuing to improve.  Will give Rx for Keflex x7 days if patient does not have improvement in symptoms over the next couple of days.  UA today with no signs of infection but will obtain urine culture.   Hematuria resolved. Patient to follow-up sooner if no improvement in symptoms.  ED precautions discussed.

## 2019-08-31 NOTE — Progress Notes (Signed)
° °  SUBJECTIVE:   CHIEF COMPLAINT / HPI:   Dysuria Patient reports symptoms never completely resolved after she was seen in the office on 6/17.  Reports that she did complete the antibiotics given. Still having dysuria. Also reports urgency. Denies frequency. Reports some pelvic pain, but is due for a hysterectomy in August. Had a UTI years ago but cannot remember what it felt like. Does report some constipation. Has IBS. No vaginal discharge. Does report bilateral back pain. No fevers.   PERTINENT  PMH / PSH: Hidradenitis suppurativa  OBJECTIVE:  BP 122/80    Pulse 88    Ht 5\' 7"  (1.702 m)    Wt 296 lb 6 oz (134.4 kg)    LMP 08/02/2019    SpO2 99%    BMI 46.42 kg/m   General: NAD, pleasant Neck: Supple Respiratory: normal work of breathing Psych: AOx3, appropriate affect  ASSESSMENT/PLAN:   UTI (urinary tract infection) Patient symptoms c/w  UTI, but could also be symptoms continuing to improve.  Will give Rx for Keflex x7 days if patient does not have improvement in symptoms over the next couple of days.  UA today with no signs of infection but will obtain urine culture.   Hematuria resolved. Patient to follow-up sooner if no improvement in symptoms.  ED precautions discussed.    [name] Moncerrath Berhe, DO PGY-3, [facility]

## 2019-10-18 NOTE — H&P (Signed)
Mackenzie Gibson is an 34 y.o. [id number] female with a H/O heavy cycles and DUB. Improved with Provera but still continued to have heavy cycles. U/S was normal. Pt with H/O BTL. Desires definite therapy.   H/O BTL SAB x1  TSVD x 4 ( largest 8 # 3 oz)  Menstrual History: Menarche age: 3 No LMP recorded. (Menstrual status: Irregular Periods).    Past Medical History:  Diagnosis Date  . Anxiety   . Bipolar disorder ([place])   . Bipolar disorder ([place])   . 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 Depression   . HPV (human papilloma virus) infection   . PCOS (polycystic ovarian syndrome)   . Pregnancy induced hypertension     Past Surgical History:  Procedure Laterality Date  . CHOLECYSTECTOMY    . EYE SURGERY    . TUBAL LIGATION Bilateral 03/03/2015   Procedure: POST PARTUM TUBAL LIGATION;  Surgeon: Guss Bunde, MD;  Location: [facility];  Service: Gynecology;  Laterality: Bilateral;    Family History  Problem Relation Age of Onset  . Depression Mother   . Bipolar disorder Mother   . Rheum arthritis Mother   . Cervical cancer Mother   . Depression Father   . High blood pressure Father   . Bipolar disorder Maternal Aunt   . Cervical cancer Sister   . Cervical cancer Maternal Grandmother     Social History:  reports that she has been smoking cigarettes. She has been smoking about 1.00 pack per day. She has never used smokeless tobacco. She reports current alcohol use. She reports that she does not use drugs.  Allergies:  Allergies  Allergen Reactions  . Hydrocodone Anaphylaxis and Other (See Comments)     Tongue swelling    No medications prior to admission.    Review of Systems  Constitutional: Negative.   Respiratory: Negative.   Cardiovascular: Negative.   Gastrointestinal: Negative.   Genitourinary: Positive for menstrual problem and pelvic pain.    There were no vitals taken for this visit. Physical Exam Constitutional:      Appearance: Normal  appearance.  Cardiovascular:     Rate and Rhythm: Normal rate and regular rhythm.  Pulmonary:     Effort: Pulmonary effort is normal.     Breath sounds: Normal breath sounds.  Abdominal:     General: Bowel sounds are normal.     Palpations: Abdomen is soft.  Genitourinary:    Comments: Nl EGBUS, uterus < 8 weeks size, mobile, no masses or tenderness Neurological:     Mental Status: She is alert.     No results found for this or any previous visit (from the past 24 hour(s)).  No results found.  Assessment/Plan: Heavy cycles/DUB  Pt desires definite therapy. TVH with possible salpingectomy discussed with pt. R/B/Post op care reviewed. Pt verbalized understanding and desires to proceed.  Chancy Milroy 10/18/2019, 11:34 AM

## 2019-10-22 ENCOUNTER — Other Ambulatory Visit (HOSPITAL_COMMUNITY)
Admission: RE | Admit: 2019-10-22 | Discharge: 2019-10-22 | Disposition: A | Discharge status: Home / Self Care | Payer: Medicaid Other | Source: Ambulatory Visit | Attending: Obstetrics and Gynecology | Admitting: Obstetrics and Gynecology

## 2019-10-22 DIAGNOSIS — Z20822 Contact with and (suspected) exposure to covid-19: Secondary | ICD-10-CM | POA: Insufficient documentation

## 2019-10-22 DIAGNOSIS — Z01812 Encounter for preprocedural laboratory examination: Secondary | ICD-10-CM | POA: Insufficient documentation

## 2019-10-22 LAB — SARS CORONAVIRUS 2 (TAT 6-24 HRS): SARS Coronavirus 2: NEGATIVE

## 2019-10-25 ENCOUNTER — Encounter (HOSPITAL_BASED_OUTPATIENT_CLINIC_OR_DEPARTMENT_OTHER)
Admission: RE | Admit: 2019-10-25 | Discharge: 2019-10-25 | Disposition: A | Discharge status: Home / Self Care | Payer: Medicaid Other | Source: Ambulatory Visit | Attending: Obstetrics and Gynecology | Admitting: Obstetrics and Gynecology

## 2019-10-25 ENCOUNTER — Telehealth: Payer: Self-pay

## 2019-10-25 ENCOUNTER — Encounter (HOSPITAL_BASED_OUTPATIENT_CLINIC_OR_DEPARTMENT_OTHER): Payer: Self-pay | Admitting: Obstetrics and Gynecology

## 2019-10-25 DIAGNOSIS — I10 Essential (primary) hypertension: Secondary | ICD-10-CM | POA: Diagnosis not present

## 2019-10-25 DIAGNOSIS — N920 Excessive and frequent menstruation with regular cycle: Secondary | ICD-10-CM | POA: Diagnosis not present

## 2019-10-25 DIAGNOSIS — F17210 Nicotine dependence, cigarettes, uncomplicated: Secondary | ICD-10-CM | POA: Diagnosis not present

## 2019-10-25 DIAGNOSIS — Z79899 Other long term (current) drug therapy: Secondary | ICD-10-CM | POA: Diagnosis not present

## 2019-10-25 DIAGNOSIS — R102 Pelvic and perineal pain: Secondary | ICD-10-CM | POA: Diagnosis present

## 2019-10-25 MED ORDER — DEXTROSE 5 % IV SOLN
3.0000 g | INTRAVENOUS | Status: AC
  Administered 2019-10-26: 3 g via INTRAVENOUS
  Filled 2019-10-25: qty 3

## 2019-10-25 NOTE — Telephone Encounter (Signed)
Called, no answer, left message that surgery location has changed.

## 2019-10-25 NOTE — Progress Notes (Signed)

## 2019-10-25 NOTE — Anesthesia Preprocedure Evaluation (Addendum)
Anesthesia Evaluation  Patient identified by MRN, date of birth, ID band Patient awake    Reviewed: Allergy & Precautions, NPO status , Patient's Chart, lab work & pertinent test results  History of Anesthesia Complications (+) PONV and history of anesthetic complications  Airway Mallampati: II  TM Distance: >3 FB Neck ROM: Full    Dental no notable dental hx.    Pulmonary asthma , Current Smoker,    Pulmonary exam normal        Cardiovascular hypertension, Normal cardiovascular exam     Neuro/Psych  Headaches, Anxiety Depression Bipolar Disorder    GI/Hepatic negative GI ROS, Neg liver ROS,   Endo/Other  Morbid obesity  Renal/GU negative Renal ROS  negative genitourinary   Musculoskeletal negative musculoskeletal ROS (+)   Abdominal   Peds  Hematology negative hematology ROS (+)   Anesthesia Other Findings Day of surgery medications reviewed with patient.  Reproductive/Obstetrics Uterine fibroids                            Anesthesia Physical Anesthesia Plan  ASA: III  Anesthesia Plan: General   Post-op Pain Management:    Induction: Intravenous  PONV Risk Score and Plan: 4 or greater and Scopolamine patch - Pre-op, Midazolam, Ondansetron, Dexamethasone and Treatment may vary due to age or medical condition  Airway Management Planned: Oral ETT  Additional Equipment: None  Intra-op Plan:   Post-operative Plan: Extubation in OR  Informed Consent: I have reviewed the patients History and Physical, chart, labs and discussed the procedure including the risks, benefits and alternatives for the proposed anesthesia with the patient or authorized representative who has indicated his/her understanding and acceptance.     Dental advisory given  Plan Discussed with: CRNA  Anesthesia Plan Comments:        Anesthesia Quick Evaluation

## 2019-10-26 ENCOUNTER — Encounter (HOSPITAL_BASED_OUTPATIENT_CLINIC_OR_DEPARTMENT_OTHER): Payer: Self-pay | Admitting: Obstetrics and Gynecology

## 2019-10-26 ENCOUNTER — Observation Stay (HOSPITAL_BASED_OUTPATIENT_CLINIC_OR_DEPARTMENT_OTHER): Payer: Medicaid Other | Admitting: Anesthesiology

## 2019-10-26 ENCOUNTER — Other Ambulatory Visit: Payer: Self-pay

## 2019-10-26 ENCOUNTER — Observation Stay (HOSPITAL_BASED_OUTPATIENT_CLINIC_OR_DEPARTMENT_OTHER)
Admission: RE | Admit: 2019-10-26 | Discharge: 2019-10-26 | Disposition: A | Discharge status: Home / Self Care | Payer: Medicaid Other | Attending: Obstetrics and Gynecology | Admitting: Obstetrics and Gynecology

## 2019-10-26 ENCOUNTER — Encounter (HOSPITAL_BASED_OUTPATIENT_CLINIC_OR_DEPARTMENT_OTHER)
Admission: RE | Disposition: A | Discharge status: Home / Self Care | Payer: Self-pay | Source: Home / Self Care | Attending: Obstetrics and Gynecology

## 2019-10-26 DIAGNOSIS — Z79899 Other long term (current) drug therapy: Secondary | ICD-10-CM | POA: Diagnosis not present

## 2019-10-26 DIAGNOSIS — N920 Excessive and frequent menstruation with regular cycle: Secondary | ICD-10-CM

## 2019-10-26 DIAGNOSIS — F17210 Nicotine dependence, cigarettes, uncomplicated: Secondary | ICD-10-CM | POA: Diagnosis not present

## 2019-10-26 DIAGNOSIS — F419 Anxiety disorder, unspecified: Secondary | ICD-10-CM | POA: Diagnosis not present

## 2019-10-26 DIAGNOSIS — G43909 Migraine, unspecified, not intractable, without status migrainosus: Secondary | ICD-10-CM | POA: Diagnosis not present

## 2019-10-26 DIAGNOSIS — N871 Moderate cervical dysplasia: Secondary | ICD-10-CM | POA: Diagnosis not present

## 2019-10-26 DIAGNOSIS — I10 Essential (primary) hypertension: Secondary | ICD-10-CM | POA: Insufficient documentation

## 2019-10-26 DIAGNOSIS — Z9889 Other specified postprocedural states: Secondary | ICD-10-CM

## 2019-10-26 DIAGNOSIS — R87613 High grade squamous intraepithelial lesion on cytologic smear of cervix (HGSIL): Secondary | ICD-10-CM | POA: Diagnosis not present

## 2019-10-26 DIAGNOSIS — N390 Urinary tract infection, site not specified: Secondary | ICD-10-CM | POA: Diagnosis not present

## 2019-10-26 DIAGNOSIS — N939 Abnormal uterine and vaginal bleeding, unspecified: Secondary | ICD-10-CM | POA: Diagnosis not present

## 2019-10-26 HISTORY — DX: Other specified postprocedural states: R11.2

## 2019-10-26 HISTORY — DX: Family history of other specified conditions: Z84.89

## 2019-10-26 HISTORY — DX: Other specified postprocedural states: Z98.890

## 2019-10-26 HISTORY — PX: VAGINAL HYSTERECTOMY: SHX2639

## 2019-10-26 LAB — TYPE AND SCREEN
ABO/RH(D): A POS
Antibody Screen: NEGATIVE

## 2019-10-26 LAB — CBC
HCT: 42 % (ref 36.0–46.0)
HCT: 39.3 % (ref 36.0–46.0)
Hemoglobin: 13.9 g/dL (ref 12.0–15.0)
Hemoglobin: 12.9 g/dL (ref 12.0–15.0)
MCH: 27.6 pg (ref 26.0–34.0)
MCH: 27.2 pg (ref 26.0–34.0)
MCHC: 33.1 g/dL (ref 30.0–36.0)
MCHC: 32.8 g/dL (ref 30.0–36.0)
MCV: 83.5 fL (ref 80.0–100.0)
MCV: 82.9 fL (ref 80.0–100.0)
Platelets: 214 10*3/uL (ref 150–400)
Platelets: 194 10*3/uL (ref 150–400)
RBC: 5.03 MIL/uL (ref 3.87–5.11)
RBC: 4.74 MIL/uL (ref 3.87–5.11)
RDW: 14.1 % (ref 11.5–15.5)
RDW: 13.7 % (ref 11.5–15.5)
WBC: 10.7 10*3/uL — ABNORMAL HIGH (ref 4.0–10.5)
WBC: 15.2 10*3/uL — ABNORMAL HIGH (ref 4.0–10.5)
nRBC: 0 % (ref 0.0–0.2)
nRBC: 0 % (ref 0.0–0.2)

## 2019-10-26 LAB — BASIC METABOLIC PANEL
Anion gap: 9 (ref 5–15)
Anion gap: 11 (ref 5–15)
BUN: 8 mg/dL (ref 6–20)
BUN: 9 mg/dL (ref 6–20)
CO2: 22 mmol/L (ref 22–32)
CO2: 22 mmol/L (ref 22–32)
Calcium: 9.2 mg/dL (ref 8.9–10.3)
Calcium: 8.7 mg/dL — ABNORMAL LOW (ref 8.9–10.3)
Chloride: 107 mmol/L (ref 98–111)
Chloride: 104 mmol/L (ref 98–111)
Creatinine, Ser: 0.76 mg/dL (ref 0.44–1.00)
Creatinine, Ser: 0.79 mg/dL (ref 0.44–1.00)
GFR calc Af Amer: >60 mL/min (ref >60)
GFR calc Af Amer: >60 mL/min (ref >60)
GFR calc non Af Amer: >60 mL/min (ref >60)
GFR calc non Af Amer: >60 mL/min (ref >60)
Glucose, Bld: 110 mg/dL — ABNORMAL HIGH (ref 70–99)
Glucose, Bld: 133 mg/dL — ABNORMAL HIGH (ref 70–99)
Potassium: 4.2 mmol/L (ref 3.5–5.1)
Potassium: 4.6 mmol/L (ref 3.5–5.1)
Sodium: 138 mmol/L (ref 135–145)
Sodium: 137 mmol/L (ref 135–145)

## 2019-10-26 LAB — POCT PREGNANCY, URINE: Preg Test, Ur: NEGATIVE

## 2019-10-26 SURGERY — HYSTERECTOMY, VAGINAL
Anesthesia: General | Site: Vagina | Laterality: Bilateral

## 2019-10-26 MED ORDER — SUGAMMADEX SODIUM 200 MG/2ML IV SOLN
INTRAVENOUS | Status: DC | PRN
  Administered 2019-10-26: 200 mg via INTRAVENOUS

## 2019-10-26 MED ORDER — LACTATED RINGERS IV SOLN: INTRAVENOUS | Status: DC

## 2019-10-26 MED ORDER — ROCURONIUM BROMIDE 100 MG/10ML IV SOLN
INTRAVENOUS | Status: DC | PRN
  Administered 2019-10-26: 60 mg via INTRAVENOUS

## 2019-10-26 MED ORDER — ONDANSETRON HCL 4 MG/2ML IJ SOLN
INTRAMUSCULAR | Status: AC
  Filled 2019-10-26: qty 2

## 2019-10-26 MED ORDER — PROPOFOL 10 MG/ML IV BOLUS
INTRAVENOUS | Status: DC | PRN
  Administered 2019-10-26: 200 mg via INTRAVENOUS

## 2019-10-26 MED ORDER — SOD CITRATE-CITRIC ACID 500-334 MG/5ML PO SOLN
ORAL | Status: AC
  Filled 2019-10-26: qty 30

## 2019-10-26 MED ORDER — OXYCODONE HCL 5 MG PO TABS: 5.0000 mg | ORAL_TABLET | Freq: Once | ORAL | Status: DC | PRN

## 2019-10-26 MED ORDER — FENTANYL CITRATE (PF) 100 MCG/2ML IJ SOLN
INTRAMUSCULAR | Status: AC
  Filled 2019-10-26: qty 2

## 2019-10-26 MED ORDER — ACETAMINOPHEN 500 MG PO TABS
1000.0000 mg | ORAL_TABLET | Freq: Once | ORAL | Status: AC
  Administered 2019-10-26: 1000 mg via ORAL

## 2019-10-26 MED ORDER — MIDAZOLAM HCL 5 MG/5ML IJ SOLN
INTRAMUSCULAR | Status: DC | PRN
  Administered 2019-10-26: 2 mg via INTRAVENOUS

## 2019-10-26 MED ORDER — HYDROMORPHONE HCL 2 MG PO TABS
2.0000 mg | ORAL_TABLET | ORAL | Status: DC | PRN
  Administered 2019-10-26 (×2): 2 mg via ORAL
  Filled 2019-10-26 (×2): qty 1

## 2019-10-26 MED ORDER — PROMETHAZINE HCL 25 MG/ML IJ SOLN
INTRAMUSCULAR | Status: AC
  Filled 2019-10-26: qty 1

## 2019-10-26 MED ORDER — ROCURONIUM BROMIDE 10 MG/ML (PF) SYRINGE
PREFILLED_SYRINGE | INTRAVENOUS | Status: AC
  Filled 2019-10-26: qty 10

## 2019-10-26 MED ORDER — OXYCODONE HCL 5 MG/5ML PO SOLN: 5.0000 mg | Freq: Once | ORAL | Status: DC | PRN

## 2019-10-26 MED ORDER — LIDOCAINE 2% (20 MG/ML) 5 ML SYRINGE
INTRAMUSCULAR | Status: AC
  Filled 2019-10-26: qty 5

## 2019-10-26 MED ORDER — MIDAZOLAM HCL 2 MG/2ML IJ SOLN
INTRAMUSCULAR | Status: AC
  Filled 2019-10-26: qty 2

## 2019-10-26 MED ORDER — IBUPROFEN 800 MG PO TABS
800.0000 mg | ORAL_TABLET | Freq: Three times a day (TID) | ORAL | 0 refills | Status: DC
Start: 2019-10-26 — End: 2020-01-10

## 2019-10-26 MED ORDER — DEXAMETHASONE SODIUM PHOSPHATE 4 MG/ML IJ SOLN
INTRAMUSCULAR | Status: DC | PRN
  Administered 2019-10-26: 5 mg via INTRAVENOUS

## 2019-10-26 MED ORDER — KETOROLAC TROMETHAMINE 15 MG/ML IJ SOLN
INTRAMUSCULAR | Status: AC
  Filled 2019-10-26: qty 1

## 2019-10-26 MED ORDER — HYDROMORPHONE HCL 1 MG/ML IJ SOLN: 1.0000 mg | INTRAMUSCULAR | Status: DC | PRN

## 2019-10-26 MED ORDER — LIDOCAINE HCL (CARDIAC) PF 100 MG/5ML IV SOSY
PREFILLED_SYRINGE | INTRAVENOUS | Status: DC | PRN
  Administered 2019-10-26: 100 mg via INTRAVENOUS

## 2019-10-26 MED ORDER — SIMETHICONE 80 MG PO CHEW: 80.0000 mg | CHEWABLE_TABLET | Freq: Four times a day (QID) | ORAL | Status: DC | PRN

## 2019-10-26 MED ORDER — ONDANSETRON HCL 4 MG/2ML IJ SOLN
4.0000 mg | Freq: Four times a day (QID) | INTRAMUSCULAR | Status: DC | PRN
  Filled 2019-10-26: qty 2

## 2019-10-26 MED ORDER — KETOROLAC TROMETHAMINE 30 MG/ML IJ SOLN
INTRAMUSCULAR | Status: AC
  Filled 2019-10-26: qty 1

## 2019-10-26 MED ORDER — ZOLPIDEM TARTRATE 5 MG PO TABS: 5.0000 mg | ORAL_TABLET | Freq: Every evening | ORAL | Status: DC | PRN

## 2019-10-26 MED ORDER — CEFAZOLIN SODIUM-DEXTROSE 2-4 GM/100ML-% IV SOLN
INTRAVENOUS | Status: AC
  Filled 2019-10-26: qty 100

## 2019-10-26 MED ORDER — KETOROLAC TROMETHAMINE 30 MG/ML IJ SOLN
30.0000 mg | Freq: Four times a day (QID) | INTRAMUSCULAR | Status: DC
  Administered 2019-10-26: 30 mg via INTRAVENOUS
  Filled 2019-10-26: qty 1

## 2019-10-26 MED ORDER — FENTANYL CITRATE (PF) 100 MCG/2ML IJ SOLN
INTRAMUSCULAR | Status: DC | PRN
Start: 2019-10-26 — End: 2019-10-26
  Administered 2019-10-26: 100 ug via INTRAVENOUS
  Administered 2019-10-26 (×2): 50 ug via INTRAVENOUS

## 2019-10-26 MED ORDER — ACETAMINOPHEN 500 MG PO TABS: 1000.0000 mg | ORAL_TABLET | ORAL | Status: DC

## 2019-10-26 MED ORDER — FENTANYL CITRATE (PF) 100 MCG/2ML IJ SOLN
25.0000 ug | INTRAMUSCULAR | Status: DC | PRN
  Administered 2019-10-26: 50 ug via INTRAVENOUS
  Administered 2019-10-26: 25 ug via INTRAVENOUS

## 2019-10-26 MED ORDER — SOD CITRATE-CITRIC ACID 500-334 MG/5ML PO SOLN
30.0000 mL | ORAL | Status: AC
  Administered 2019-10-26: 30 mL via ORAL

## 2019-10-26 MED ORDER — PROPOFOL 10 MG/ML IV BOLUS
INTRAVENOUS | Status: AC
  Filled 2019-10-26: qty 20

## 2019-10-26 MED ORDER — ONDANSETRON HCL 4 MG PO TABS: 4.0000 mg | ORAL_TABLET | Freq: Four times a day (QID) | ORAL | Status: DC | PRN

## 2019-10-26 MED ORDER — DEXAMETHASONE SODIUM PHOSPHATE 10 MG/ML IJ SOLN
INTRAMUSCULAR | Status: AC
  Filled 2019-10-26: qty 1

## 2019-10-26 MED ORDER — IBUPROFEN 600 MG PO TABS: 800.0000 mg | ORAL_TABLET | Freq: Three times a day (TID) | ORAL | Status: DC

## 2019-10-26 MED ORDER — SUGAMMADEX SODIUM 500 MG/5ML IV SOLN
INTRAVENOUS | Status: AC
  Filled 2019-10-26: qty 5

## 2019-10-26 MED ORDER — PROMETHAZINE HCL 25 MG/ML IJ SOLN
6.2500 mg | INTRAMUSCULAR | Status: AC | PRN
  Administered 2019-10-26 (×2): 12.5 mg via INTRAVENOUS

## 2019-10-26 MED ORDER — KETOROLAC TROMETHAMINE 15 MG/ML IJ SOLN
15.0000 mg | INTRAMUSCULAR | Status: AC
  Administered 2019-10-26: 15 mg via INTRAVENOUS

## 2019-10-26 MED ORDER — POVIDONE-IODINE 10 % EX SWAB: 2.0000 "application " | Freq: Once | CUTANEOUS | Status: DC

## 2019-10-26 MED ORDER — HYDROMORPHONE HCL 2 MG PO TABS
2.0000 mg | ORAL_TABLET | ORAL | 0 refills | Status: DC | PRN
Start: 2019-10-26 — End: 2019-11-24

## 2019-10-26 MED ORDER — ACETAMINOPHEN 500 MG PO TABS
ORAL_TABLET | ORAL | Status: AC
  Filled 2019-10-26: qty 2

## 2019-10-26 MED ORDER — CEFAZOLIN SODIUM-DEXTROSE 1-4 GM/50ML-% IV SOLN
INTRAVENOUS | Status: AC
  Filled 2019-10-26: qty 50

## 2019-10-26 MED ORDER — ONDANSETRON HCL 4 MG/2ML IJ SOLN
INTRAMUSCULAR | Status: DC | PRN
  Administered 2019-10-26: 4 mg via INTRAVENOUS

## 2019-10-26 SURGICAL SUPPLY — 27 items
BLADE SURG 10 STRL SS (BLADE) ×3 IMPLANT
CNTNR URN SCR LID CUP LEK RST (MISCELLANEOUS) ×1 IMPLANT
CONT SPEC 4OZ STRL OR WHT (MISCELLANEOUS) ×3
COVER WAND RF STERILE (DRAPES) ×3 IMPLANT
GLOVE BIO SURGEON STRL SZ7.5 (GLOVE) ×3 IMPLANT
GLOVE BIOGEL PI IND STRL 6.5 (GLOVE) ×1 IMPLANT
GLOVE BIOGEL PI IND STRL 7.0 (GLOVE) ×2 IMPLANT
GLOVE BIOGEL PI IND STRL 8 (GLOVE) ×1 IMPLANT
GLOVE BIOGEL PI INDICATOR 6.5 (GLOVE) ×2
GLOVE BIOGEL PI INDICATOR 7.0 (GLOVE) ×4
GLOVE BIOGEL PI INDICATOR 8 (GLOVE) ×2
GOWN STRL REUS W/ TWL LRG LVL3 (GOWN DISPOSABLE) ×3 IMPLANT
GOWN STRL REUS W/ TWL XL LVL3 (GOWN DISPOSABLE) ×1 IMPLANT
GOWN STRL REUS W/TWL LRG LVL3 (GOWN DISPOSABLE) ×9
GOWN STRL REUS W/TWL XL LVL3 (GOWN DISPOSABLE) ×3
KIT TURNOVER KIT B (KITS) ×3 IMPLANT
NS IRRIG 1000ML POUR BTL (IV SOLUTION) ×3 IMPLANT
PACK VAGINAL WOMENS (CUSTOM PROCEDURE TRAY) ×3 IMPLANT
PAD OB MATERNITY 4.3X12.25 (PERSONAL CARE ITEMS) ×3 IMPLANT
SUT VIC AB 2-0 CT1 18 (SUTURE) ×3 IMPLANT
SUT VIC AB 2-0 CT1 27 (SUTURE) ×3
SUT VIC AB 2-0 CT1 TAPERPNT 27 (SUTURE) ×1 IMPLANT
SUT VIC AB PLUS 45CM 1-MO-4 (SUTURE) ×6 IMPLANT
SUT VICRYL 1 TIES 12X18 (SUTURE) ×3 IMPLANT
TOWEL GREEN STERILE FF (TOWEL DISPOSABLE) ×6 IMPLANT
TRAY FOLEY W/BAG SLVR 14FR LF (SET/KITS/TRAYS/PACK) ×3 IMPLANT
UNDERPAD 30X36 HEAVY ABSORB (UNDERPADS AND DIAPERS) ×3 IMPLANT

## 2019-10-26 NOTE — Anesthesia Procedure Notes (Signed)
Procedure Name: Intubation Date/Time: 10/26/2019 7:37 AM Performed by: Bufford Spikes, CRNA Pre-anesthesia Checklist: Patient identified, Emergency Drugs available, Suction available and Patient being monitored Patient Re-evaluated:Patient Re-evaluated prior to induction Oxygen Delivery Method: Circle system utilized Preoxygenation: Pre-oxygenation with 100% oxygen Induction Type: IV induction Ventilation: Mask ventilation without difficulty Laryngoscope Size: Miller and 2 Grade View: Grade II Tube type: Oral Tube size: 7.0 mm Number of attempts: 1 Airway Equipment and Method: Stylet Placement Confirmation: ETT inserted through vocal cords under direct vision,  positive ETCO2 and breath sounds checked- equal and bilateral Secured at: 21 cm Tube secured with: Tape Dental Injury: Teeth and Oropharynx as per pre-operative assessment

## 2019-10-26 NOTE — Anesthesia Postprocedure Evaluation (Signed)
Anesthesia Post Note  Patient: Mackenzie Gibson  Procedure(s) Performed: HYSTERECTOMY VAGINAL (Bilateral Vagina )     Patient location during evaluation: PACU Anesthesia Type: General Level of consciousness: awake and alert and oriented Pain management: pain level controlled Vital Signs Assessment: post-procedure vital signs reviewed and stable Respiratory status: spontaneous breathing, nonlabored ventilation and respiratory function stable Cardiovascular status: blood pressure returned to baseline Postop Assessment: no apparent nausea or vomiting Anesthetic complications: no   No complications documented.  Last Vitals:  Vitals:   10/26/19 0945 10/26/19 1000  BP: 126/81 140/85  Pulse: 82 86  Resp: 18 20  Temp:    SpO2: 94% 100%    Last Pain:  Vitals:   10/26/19 1000  TempSrc:   PainSc: [place]

## 2019-10-26 NOTE — Discharge Instructions (Signed)
Vaginal Hysterectomy, Care After Refer to this sheet in the next few weeks. These instructions provide you with information about caring for yourself after your procedure. Your health care provider may also give you more specific instructions. Your treatment has been planned according to current medical practices, but problems sometimes occur. Call your health care provider if you have any problems or questions after your procedure. What can I expect after the procedure? After the procedure, it is common to have:  Pain.  Soreness and numbness in your incision areas.  Vaginal bleeding and discharge.  Constipation.  Temporary problems emptying the bladder.  Feelings of sadness or other emotions. Follow these instructions at home: Medicines  Take over-the-counter and prescription medicines only as told by your health care provider.  If you were prescribed an antibiotic medicine, take it as told by your health care provider. Do not stop taking the antibiotic even if you start to feel better.  Do not drive or operate heavy machinery while taking prescription pain medicine. Activity  Return to your normal activities as told by your health care provider. Ask your health care provider what activities are safe for you.  Get regular exercise as told by your health care provider. You may be told to take short walks every day and go farther each time.  Do not lift anything that is heavier than 10 lb (4.5 kg). General instructions   Do not put anything in your vagina for 6 weeks after your surgery or as told by your health care provider. This includes tampons and douches.  Do not have sex until your health care provider says you can.  Do not take baths, swim, or use a hot tub until your health care provider approves.  Drink enough fluid to keep your urine clear or pale yellow.  Do not drive for 24 hours if you were given a sedative.  Keep all follow-up visits as told by your health  care provider. This is important. Contact a health care provider if:  Your pain medicine is not helping.  You have a fever.  You have redness, swelling, or pain at your incision site.  You have blood, pus, or a bad-smelling discharge from your vagina.  You continue to have difficulty urinating. Get help right away if:  You have severe abdominal or back pain.  You have heavy bleeding from your vagina.  You have chest pain or shortness of breath. This information is not intended to replace advice given to you by your health care provider. Make sure you discuss any questions you have with your health care provider. Document Revised: 10/12/2015 Document Reviewed: 03/05/2015 Elsevier Patient Education  [place].   No Ibuprofen until 11:00pm 2mg  Dilaudid taken at 3:00pm, next dose at 7:00pm if needed   Post Anesthesia Home Care Instructions  Activity: Get plenty of rest for the remainder of the day. A responsible individual must stay with you for 24 hours following the procedure.  For the next 24 hours, DO NOT: -Drive a car -Paediatric nurse -Drink alcoholic beverages -Take any medication unless instructed by your physician -Make any legal decisions or sign important papers.  Meals: Start with liquid foods such as gelatin or soup. Progress to regular foods as tolerated. Avoid greasy, spicy, heavy foods. If nausea and/or vomiting occur, drink only clear liquids until the nausea and/or vomiting subsides. Call your physician if vomiting continues.  Special Instructions/Symptoms: Your throat may feel dry or sore from the anesthesia or the breathing tube  placed in your throat during surgery. If this causes discomfort, gargle with warm salt water. The discomfort should disappear within 24 hours.  If you had a scopolamine patch placed behind your ear for the management of post- operative nausea and/or vomiting:  1. The medication in the patch is effective for 72 hours,  after which it should be removed.  Wrap patch in a tissue and discard in the trash. Wash hands thoroughly with soap and water. 2. You may remove the patch earlier than 72 hours if you experience unpleasant side effects which may include dry mouth, dizziness or visual disturbances. 3. Avoid touching the patch. Wash your hands with soap and water after contact with the patch.

## 2019-10-26 NOTE — Interval H&P Note (Signed)
History and Physical Interval Note:  10/26/2019 7:16 AM  Mackenzie Gibson  has presented today for surgery, with the diagnosis of FIBROIDS.  The various methods of treatment have been discussed with the patient and family. After consideration of risks, benefits and other options for treatment, the patient has consented to  Procedure(s): HYSTERECTOMY VAGINAL WITH SALPINGECTOMY (Bilateral) as a surgical intervention.  The patient's history has been reviewed, patient examined, no change in status, stable for surgery.  I have reviewed the patient's chart and labs.  Questions were answered to the patient's satisfaction.     Chancy Milroy

## 2019-10-26 NOTE — Op Note (Signed)
[name] PROCEDURE DATE: 10/26/2019  PREOPERATIVE DIAGNOSIS:   Menorrhagia POSTOPERATIVE DIAGNOSIS:   Menorrhagia SURGEON:   Chancy Milroy M.D., FACOG ASSISTANT: Mora Bellman, M.D.  An experienced assistant was required given the standard of surgical care given the complexity of the case.  This assistant was needed for exposure, dissection, suctioning, retraction and for overall help during the procedure.   OPERATION:  Total Vaginal hysterectomy  ANESTHESIA:  General endotracheal.  INDICATIONS: The patient is a 34 y.o. [id number] with history of menorrhagia. The patient made a decision to undergo definite surgical treatment. On the preoperative visit, the risks, benefits, indications, and alternatives of the procedure were reviewed with the patient.  On the day of surgery, the risks of surgery were again discussed with the patient including but not limited to: bleeding which may require transfusion or reoperation; infection which may require antibiotics; injury to bowel, bladder, ureters or other surrounding organs; need for additional procedures; thromboembolic phenomenon, incisional problems and other postoperative/anesthesia complications. Written informed consent was obtained.    OPERATIVE FINDINGS: A 10 week size uterus with normal tubes and ovaries bilaterally.  ESTIMATED BLOOD LOSS: 150 ml FLUIDS:  As recorded URINE OUTPUT:  As recorded SPECIMENS:  Uterus and cervix sent to pathology COMPLICATIONS:  None immediate.  DESCRIPTION OF PROCEDURE:  The patient received intravenous antibiotics and had sequential compression devices applied to her lower extremities while in the preoperative area.  She was then taken to the operating room where general anesthesia was administered and was found to be adequate.  She was placed in the dorsal lithotomy position, and was prepped and draped in a sterile manner.  The patients bladder was drained with a catheter. After an adequate timeout was  performed, attention was turned to her pelvis.  A weighted speculum was then placed in the vagina, and the anterior and posterior lips of the cervix were grasped bilaterally with tenaculums.  The cervix was then circumferentially incised, and the bladder was dissected off the pubocervical fascia without complication.  The posterior cul-de-sac was entered sharply without difficulty.   A long weighted speculum was inserted into the posterior cul-de-sac.  The Zeplin clamp was then used to clamp the uterosacral ligaments on either side.  They were then cut and sutured ligated with 0 Vicryl.  Of note, all sutures used in this case were 0 Vicryl unless otherwise noted.   The cardinal ligaments were then clamped, cut and ligated. The anterior cul-de-sac was then entered sharpely. The uterine vessels and broad ligaments were then serially clamped with the  clamps, cut, and suture ligated on both sides. The final pedicle involving the IP was clamped, cut and ligated with a free and in a [place] fashion. This was repeat on the opposite size. The specimen was then passed off. I was unable to see the tubes bilaterally.   All pedicles from the uterosacral ligament to the cornua were examined hemostasis was confirmed. The perineum was closed with 2/0 Vicryl in a purse string fashion.  The vaginal cuff was closed using 2/0 Vicryl.  All instruments were then removed from the pelvis.  The patient tolerated the procedure well.  All instruments, needles, and sponge counts were correct x 2. The patient was taken to the recovery room in stable condition.

## 2019-10-26 NOTE — Transfer of Care (Signed)
Immediate Anesthesia Transfer of Care Note  Patient: Mackenzie Gibson  Procedure(s) Performed: HYSTERECTOMY VAGINAL (Bilateral Vagina )  Patient Location: PACU  Anesthesia Type:General  Level of Consciousness: awake, alert  and oriented  Airway & Oxygen Therapy: Patient Spontanous Breathing and Patient connected to face mask oxygen  Post-op Assessment: Report given to RN and Post -op Vital signs reviewed and stable  Post vital signs: Reviewed and stable  Last Vitals:  Vitals Value Taken Time  BP 130/74 10/26/19 0839  Temp    Pulse 84 10/26/19 0840  Resp 21 10/26/19 0840  SpO2 100 % 10/26/19 0840  Vitals shown include unvalidated device data.  Last Pain:  Vitals:   10/26/19 0643  TempSrc: Oral  PainSc: 0-No pain         Complications: No complications documented.

## 2019-10-27 ENCOUNTER — Encounter (HOSPITAL_BASED_OUTPATIENT_CLINIC_OR_DEPARTMENT_OTHER): Payer: Self-pay | Admitting: Obstetrics and Gynecology

## 2019-10-27 LAB — SURGICAL PATHOLOGY

## 2019-10-27 NOTE — Discharge Summary (Signed)
Physician Discharge Summary  Patient ID: Mackenzie Gibson MRN: [id number] DOB/AGE: 35-Mar-1987 35 y.o.  Admit date: 10/26/2019 Discharge date: 10/26/19  Admission Diagnoses: Menorrhagia   Discharge Diagnoses:  Active Problems:   Post-operative state   Menorrhagia with regular cycle   Discharged Condition: good  Hospital Course: Ms Mackenzie Gibson was admitted with above Dx. She underwent TVH on 10/26/19 without problems. See OP note for additional information. Pt's post op course was unremarkable. She progressed to ambulating, voiding, tolerating diet and good oral pain control. Pt desired discharge hone on DOS. Discharge instructions, medications and follow up were reviewed with the pt. She verbalized understanding and was discharged home  Consults: None  Significant Diagnostic Studies: labs  Treatments: surgery: TVH  Discharge Exam: Blood pressure (!) 144/90, pulse 78, temperature (!) 97.3 F (36.3 C), resp. rate 18, height 5\' 7"  (1.702 m), weight 135.3 kg, SpO2 100 %.  Lungs clear Heart RRR Abd soft + BS GU no bleeding Ext non tender  Disposition: Discharge disposition: 01-Home or Self Care       Discharge Instructions    Call MD for:  difficulty breathing, headache or visual disturbances   Complete by: As directed    Call MD for:  extreme fatigue   Complete by: As directed    Call MD for:  hives   Complete by: As directed    Call MD for:  persistant dizziness or light-headedness   Complete by: As directed    Call MD for:  persistant nausea and vomiting   Complete by: As directed    Call MD for:  redness, tenderness, or signs of infection (pain, swelling, redness, odor or green/yellow discharge around incision site)   Complete by: As directed    Call MD for:  severe uncontrolled pain   Complete by: As directed    Call MD for:  temperature >100.4   Complete by: As directed    Diet - low sodium heart healthy   Complete by: As directed    Increase activity slowly    Complete by: As directed    Sexual Activity Restrictions   Complete by: As directed    Pelvic rest x 4 weeks     Allergies as of 10/26/2019      Reactions   Hydrocodone Anaphylaxis, Other (See Comments)    Tongue swelling      Medication List    STOP taking these medications   benzonatate 200 MG capsule Commonly known as: TESSALON   clindamycin 1 % external solution Commonly known as: Cleocin-T   PanOxyl Foaming Wash 10 % Liqd Generic drug: Benzoyl Peroxide   Qvar 40 MCG/ACT inhaler Generic drug: beclomethasone     TAKE these medications   albuterol 108 (90 Base) MCG/ACT inhaler Commonly known as: VENTOLIN HFA Inhale 1 puff into the lungs every 6 (six) hours as needed for wheezing or shortness of breath. What changed: how much to take   Clindamycin-Benzoyl Per (Refr) gel Apply 1 application topically 2 (two) times daily.   doxycycline 100 MG EC tablet Commonly known as: DORYX Take 100 mg by mouth 2 (two) times daily.   HYDROmorphone 2 MG tablet Commonly known as: DILAUDID Take 1 tablet (2 mg total) by mouth every 4 (four) hours as needed for severe pain.   ibuprofen 800 MG tablet Commonly known as: ADVIL Take 1 tablet (800 mg total) by mouth 3 (three) times daily. What changed:   medication strength  how much to take  when to take this  reasons to take this       [place]             . Schedule an appointment as soon as possible for a visit in 4 weeks.   Why: Post op appt with Dr. Gardiner Gibson Contact information: [facility]              Signed: Chancy Gibson 10/27/2019, 7:26 AM

## 2019-11-02 ENCOUNTER — Telehealth: Payer: Self-pay | Admitting: Obstetrics and Gynecology

## 2019-11-02 NOTE — Telephone Encounter (Signed)
  Returned patients call [facility]home phone number listed. Patient post vaginal hysterectomy on 8/24. Patient did not answer. Mailbox full and not able to accept messages at this time.   Called mobile phone listed on account, LM for patient to call the office at her convenience.   My Chart Message sent.

## 2019-11-02 NOTE — Telephone Encounter (Signed)
Patient had surgery last week and she said she has not been feeling good, keep going from cold to Hot and feel like she's going to  pass out

## 2019-11-09 ENCOUNTER — Ambulatory Visit: Payer: Medicaid Other

## 2019-11-11 ENCOUNTER — Ambulatory Visit (INDEPENDENT_AMBULATORY_CARE_PROVIDER_SITE_OTHER): Payer: Medicaid Other | Admitting: Family Medicine

## 2019-11-11 ENCOUNTER — Encounter: Payer: Self-pay | Admitting: Family Medicine

## 2019-11-11 ENCOUNTER — Other Ambulatory Visit: Payer: Self-pay

## 2019-11-11 VITALS — BP 130/72 | HR 99 | Ht 67.0 in | Wt 298.5 lb

## 2019-11-11 DIAGNOSIS — D72829 Elevated white blood cell count, unspecified: Secondary | ICD-10-CM | POA: Diagnosis not present

## 2019-11-11 DIAGNOSIS — E282 Polycystic ovarian syndrome: Secondary | ICD-10-CM

## 2019-11-11 DIAGNOSIS — T402X5A Adverse effect of other opioids, initial encounter: Secondary | ICD-10-CM

## 2019-11-11 DIAGNOSIS — K5903 Drug induced constipation: Secondary | ICD-10-CM | POA: Diagnosis not present

## 2019-11-11 DIAGNOSIS — L732 Hidradenitis suppurativa: Secondary | ICD-10-CM

## 2019-11-11 LAB — GLUCOSE, POCT (MANUAL RESULT ENTRY): POC Glucose: 168 mg/dl — AB (ref 70–99)

## 2019-11-11 MED ORDER — PANOXYL FOAMING WASH 10 % EX LIQD: 1.0000 | Freq: Every day | CUTANEOUS | 0 refills | Status: DC

## 2019-11-11 MED ORDER — DOXYCYCLINE HYCLATE 50 MG PO TBEC: 50.0000 mg | DELAYED_RELEASE_TABLET | Freq: Two times a day (BID) | ORAL | 1 refills | Status: DC

## 2019-11-11 MED ORDER — SENNA 8.6 MG PO CAPS: 1.0000 | ORAL_CAPSULE | Freq: Two times a day (BID) | ORAL | 0 refills | Status: DC | PRN

## 2019-11-11 MED ORDER — POLYETHYLENE GLYCOL 3350 17 GM/SCOOP PO POWD: 17.0000 g | Freq: Two times a day (BID) | ORAL | 1 refills | Status: DC | PRN

## 2019-11-11 NOTE — Assessment & Plan Note (Signed)
Miralax 2 caps BID for 7 days. Stop if develop diarrhea. Senna 1 tab BID with Miralax. Discontinue Colace.

## 2019-11-11 NOTE — Assessment & Plan Note (Signed)
>>  ASSESSMENT AND PLAN FOR POLYCYSTIC OVARIAN DISEASE WRITTEN ON 11/11/2019  7:11 PM BY BRIMAGE, VONDRA, DO  Patient concerned that CBG was elevated on recent lab work. CBG today 168 (non-fasting). Previously Rx metformin for insulin resistance. Most recent a1c 4.9 about 5 months ago. Follow up with GYN. - Consider repeat a1c, if elevated consider restarting Metformin

## 2019-11-11 NOTE — Patient Instructions (Signed)
It was great seeing you today!   Visit Remembers: - Stop by the pharmacy to pick up your prescriptions  - Continue to work on your healthy eating habits and incorporating exercise into your daily life.  - Medicine Changes:  -Miralax 2 capfuls for the next week. Stop if you get diarrhea.   Call Dr. Valinda Hoar office regarding your pain and vaginal bleeding to check your sutures after your hysterectomy.    Regarding lab work today:  Due to recent changes in healthcare laws, you may see the results of your imaging and laboratory studies on MyChart before your provider has had a chance to review them.  I understand that in some cases there may be results that are confusing or concerning to you. Not all laboratory results come back in the same time frame and you may be waiting for multiple results in order to interpret others.  Please give [name] 72 hours in order for your provider to thoroughly review all the results before contacting the office for clarification of your results. If everything is normal, you will get a letter in the mail or a message in My Chart. Please give [name] a call if you do not hear from [name] after 2 weeks.  Please bring all of your medications with you to each visit.    If you haven't already, sign up for My Chart to have easy access to your labs results, and communication with your primary care physician.  Feel free to call with any questions or concerns at any time, at [phone].   Take care,  Dr. Rushie [facility]

## 2019-11-11 NOTE — Progress Notes (Signed)
   SUBJECTIVE:   CHIEF COMPLAINT / HPI:   Chief Complaint  Patient presents with  . Follow-up     Mackenzie Gibson is a 34 y.o. female here for to discuss labs results.  Patient had hysterectomy about 2 weeks ago. She has been taking Dilaudid for pain and requests refill. Endorses some bright red blood after her surgery from her vagina and constipation. Has tried colace but reports anxiety and panic attacks with Colace. She states she has felt "off" since surgery.  Has had sweaty palms and feet, palpitation, face paraesthesias. States her partner tells her she get really red during these episodes. Patient states symptoms happen after she takes Colace. Would like something different for constipation. Has hx of anxiety, panic disorder and bipolar disease.   PERTINENT  PMH / PSH: reviewed and updated as appropriate   OBJECTIVE:   BP 130/72   Pulse 99   Ht 5\' 7"  (1.702 m)   Wt 298 lb 8 oz (135.4 kg)   LMP 09/29/2019   SpO2 98%   BMI 46.75 kg/m    GEN: anxious, well developed female in no acute distress  CV: regular rate and rhythm, no murmurs appreciated  RESP: no increased work of breathing, clear to ascultation bilaterally  ABD: Bowel sounds present. Soft, LLQ tenderness, mild distension  MSK: normal ROM SKIN: warm, dry,  multiple healing site from hidradenitis suppurativa on abdomen    ASSESSMENT/PLAN:   Hidradenitis suppurativa Refilled doxycyline 50 mg BID, Panoxyl foaming wash  POLYCYSTIC OVARIAN DISEASE Patient concerned that CBG was elevated on recent lab work. CBG today 168 (non-fasting). Previously Rx metformin for insulin resistance. Most recent a1c 4.9 about 5 months ago. Follow up with GYN. - Consider repeat a1c, if elevated consider restarting Metformin   Constipation due to opioid therapy Miralax 2 caps BID for 7 days. Stop if develop diarrhea. Senna 1 tab BID with Miralax. Discontinue Colace.    Reviewed recent lab work discussed. WBC elevated likely 2/2  to stress reaction after recent surgery. Will repeat today. Discussed elevated glucose with patient.    PHQ9 : 2.    [name], DO PGY-2, [facility] 11/11/2019

## 2019-11-11 NOTE — Assessment & Plan Note (Addendum)
Patient concerned that CBG was elevated on recent lab work. CBG today 168 (non-fasting). Previously Rx metformin for insulin resistance. Most recent a1c 4.9 about 5 months ago. Follow up with GYN. - Consider repeat a1c, if elevated consider restarting Metformin

## 2019-11-11 NOTE — Assessment & Plan Note (Signed)
Refilled doxycyline 50 mg BID, Panoxyl foaming wash

## 2019-11-12 ENCOUNTER — Telehealth: Payer: Self-pay

## 2019-11-12 LAB — CBC
Hematocrit: 42.9 % (ref 34.0–46.6)
Hemoglobin: 14.4 g/dL (ref 11.1–15.9)
MCH: 28.2 pg (ref 26.6–33.0)
MCHC: 33.6 g/dL (ref 31.5–35.7)
MCV: 84 fL (ref 79–97)
Platelets: 265 10*3/uL (ref 150–450)
RBC: 5.11 x10E6/uL (ref 3.77–5.28)
RDW: 13.6 % (ref 11.7–15.4)
WBC: 13.2 10*3/uL — ABNORMAL HIGH (ref 3.4–10.8)

## 2019-11-12 NOTE — Telephone Encounter (Signed)
Received fax from pharmacy, PA needed on Doxycycline. Clinical questions submitted via Cover My Meds. Waiting on response, could take up to 72 hours.  Cover My Meds info: Key: [id number]

## 2019-11-15 NOTE — Telephone Encounter (Signed)
Medication approved. The pharmacy has been updated.     

## 2019-11-24 ENCOUNTER — Encounter: Payer: Self-pay | Admitting: Obstetrics and Gynecology

## 2019-11-24 ENCOUNTER — Ambulatory Visit (INDEPENDENT_AMBULATORY_CARE_PROVIDER_SITE_OTHER): Payer: Medicaid Other | Admitting: Obstetrics and Gynecology

## 2019-11-24 ENCOUNTER — Other Ambulatory Visit: Payer: Self-pay

## 2019-11-24 VITALS — BP 143/89 | HR 86 | Ht 67.0 in | Wt 305.5 lb

## 2019-11-24 DIAGNOSIS — Z9889 Other specified postprocedural states: Secondary | ICD-10-CM

## 2019-11-24 DIAGNOSIS — E282 Polycystic ovarian syndrome: Secondary | ICD-10-CM

## 2019-11-24 MED ORDER — METFORMIN HCL 500 MG PO TABS: ORAL_TABLET | ORAL | 5 refills | Status: DC

## 2019-11-24 NOTE — Patient Instructions (Addendum)
Health Maintenance, Female Adopting a healthy lifestyle and getting preventive care are important in promoting health and wellness. Ask your health care provider about:  The right schedule for you to have regular tests and exams.  Things you can do on your own to prevent diseases and keep yourself healthy. What should I know about diet, weight, and exercise? Eat a healthy diet   Eat a diet that includes plenty of vegetables, fruits, low-fat dairy products, and lean protein.  Do not eat a lot of foods that are high in solid fats, added sugars, or sodium. Maintain a healthy weight Body mass index (BMI) is used to identify weight problems. It estimates body fat based on height and weight. Your health care provider can help determine your BMI and help you achieve or maintain a healthy weight. Get regular exercise Get regular exercise. This is one of the most important things you can do for your health. Most adults should:  Exercise for at least 150 minutes each week. The exercise should increase your heart rate and make you sweat (moderate-intensity exercise).  Do strengthening exercises at least twice a week. This is in addition to the moderate-intensity exercise.  Spend less time sitting. Even light physical activity can be beneficial. Watch cholesterol and blood lipids Have your blood tested for lipids and cholesterol at 35 years of age, then have this test every 5 years. Have your cholesterol levels checked more often if:  Your lipid or cholesterol levels are high.  You are older than 35 years of age.  You are at high risk for heart disease. What should I know about cancer screening? Depending on your health history and family history, you may need to have cancer screening at various ages. This may include screening for:  Breast cancer.  Cervical cancer.  Colorectal cancer.  Skin cancer.  Lung cancer. What should I know about heart disease, diabetes, and high blood  pressure? Blood pressure and heart disease  High blood pressure causes heart disease and increases the risk of stroke. This is more likely to develop in people who have high blood pressure readings, are of African descent, or are overweight.  Have your blood pressure checked: ? Every 3-5 years if you are 18-39 years of age. ? Every year if you are 40 years old or older. Diabetes Have regular diabetes screenings. This checks your fasting blood sugar level. Have the screening done:  Once every three years after age 40 if you are at a normal weight and have a low risk for diabetes.  More often and at a younger age if you are overweight or have a high risk for diabetes. What should I know about preventing infection? Hepatitis B If you have a higher risk for hepatitis B, you should be screened for this virus. Talk with your health care provider to find out if you are at risk for hepatitis B infection. Hepatitis C Testing is recommended for:  Everyone born from 1945 through 1965.  Anyone with known risk factors for hepatitis C. Sexually transmitted infections (STIs)  Get screened for STIs, including gonorrhea and chlamydia, if: ? You are sexually active and are younger than 35 years of age. ? You are older than 35 years of age and your health care provider tells you that you are at risk for this type of infection. ? Your sexual activity has changed since you were last screened, and you are at increased risk for chlamydia or gonorrhea. Ask your health care provider if   you are at risk.  Ask your health care provider about whether you are at high risk for HIV. Your health care provider may recommend a prescription medicine to help prevent HIV infection. If you choose to take medicine to prevent HIV, you should first get tested for HIV. You should then be tested every 3 months for as long as you are taking the medicine. Pregnancy  If you are about to stop having your period (premenopausal) and  you may become pregnant, seek counseling before you get pregnant.  Take 400 to 800 micrograms (mcg) of folic acid every day if you become pregnant.  Ask for birth control (contraception) if you want to prevent pregnancy. Osteoporosis and menopause Osteoporosis is a disease in which the bones lose minerals and strength with aging. This can result in bone fractures. If you are 92 years old or older, or if you are at risk for osteoporosis and fractures, ask your health care provider if you should:  Be screened for bone loss.  Take a calcium or vitamin D supplement to lower your risk of fractures.  Be given hormone replacement therapy (HRT) to treat symptoms of menopause. Follow these instructions at home: Lifestyle  Do not use any products that contain nicotine or tobacco, such as cigarettes, e-cigarettes, and chewing tobacco. If you need help quitting, ask your health care provider.  Do not use street drugs.  Do not share needles.  Ask your health care provider for help if you need support or information about quitting drugs. Alcohol use  Do not drink alcohol if: ? Your health care provider tells you not to drink. ? You are pregnant, may be pregnant, or are planning to become pregnant.  If you drink alcohol: ? Limit how much you use to 0-1 drink a day. ? Limit intake if you are breastfeeding.  Be aware of how much alcohol is in your drink. In the [place] one drink equals one 12 oz bottle of beer (355 mL), one 5 oz glass of wine (148 mL), or one 1 oz glass of hard liquor (44 mL). General instructions  Schedule regular health, dental, and eye exams.  Stay current with your vaccines.  Tell your health care provider if: ? You often feel depressed. ? You have ever been abused or do not feel safe at home. Summary  Adopting a healthy lifestyle and getting preventive care are important in promoting health and wellness.  Follow your health care provider's instructions about healthy  diet, exercising, and getting tested or screened for diseases.  Follow your health care provider's instructions on monitoring your cholesterol and blood pressure. This information is not intended to replace advice given to you by your health care provider. Make sure you discuss any questions you have with your health care provider. Document Revised: 02/11/2018 Document Reviewed: 02/11/2018 Elsevier Patient Education  [place].  Diet for Polycystic Ovary Syndrome Polycystic ovary syndrome (PCOS) is a disorder of the chemicals (hormones) that regulate a woman's reproductive system, including monthly periods (menstruation). The condition causes important hormones to be out of balance. PCOS can:  Stop your periods or make them irregular.  Cause cysts to develop on your ovaries.  Make it difficult to get pregnant.  Stop your body from responding to the effects of insulin (insulin resistance). Insulin resistance can lead to obesity and diabetes. Changing what you eat can help you manage PCOS and improve your health. Following a balanced diet can help you lose weight and improve the way  that your body uses insulin. What are tips for following this plan?  Follow a balanced diet for meals and snacks. Eat breakfast, lunch, dinner, and one or two snacks every day.  Include protein in each meal and snack.  Choose whole grains instead of products that are made with refined flour.  Eat a variety of foods.  Exercise regularly as told by your health care provider. Aim to do 30 or more minutes of exercise on most days of the week.  If you are overweight or obese: ? Pay attention to how many calories you eat. Cutting down on calories can help you lose weight. ? Work with your health care provider or a diet and nutrition specialist (dietitian) to figure out how many calories you need each day. What foods can I eat?  Fruits Include a variety of colors and types. All fruits are helpful for  PCOS. Vegetables Include a variety of colors and types. All vegetables are helpful for PCOS. Grains Whole grains, such as whole wheat. Whole-grain breads, crackers, cereals, and pasta. Unsweetened oatmeal, bulgur, barley, quinoa, and brown rice. Tortillas made from corn or whole-wheat flour. Meats and other proteins Low-fat (lean) proteins, such as fish, chicken, beans, eggs, and tofu. Dairy Low-fat dairy products, such as skim milk, cheese sticks, and yogurt. Beverages Low-fat or fat-free drinks, such as water, low-fat milk, sugar-free drinks, and small amounts of 100% fruit juice. Seasonings and condiments Ketchup. Mustard. Barbecue sauce. Relish. Low-fat or fat-free mayonnaise. Fats and oils Olive oil or canola oil. Walnuts and almonds. The items listed above may not be a complete list of recommended foods and beverages. Contact a dietitian for more options. What foods are not recommended? Foods that are high in calories or fat. Fried foods. Sweets. Products that are made from refined white flour, including white bread, pastries, white rice, and pasta. The items listed above may not be a complete list of foods and beverages to avoid. Contact a dietitian for more information. Summary  PCOS is a hormonal imbalance that affects a woman's reproductive system.  You can help to manage your PCOS by exercising regularly and eating a healthy, varied diet of vegetables, fruit, whole grains, low-fat (lean) protein, and low-fat dairy products.  Changing what you eat can improve the way that your body uses insulin, help your hormones reach normal levels, and help you lose weight. This information is not intended to replace advice given to you by your health care provider. Make sure you discuss any questions you have with your health care provider. Document Revised: 06/10/2018 Document Reviewed: 12/23/2016 Elsevier Patient Education  [place].

## 2019-11-24 NOTE — Progress Notes (Signed)
Ms Piechocki presents for post op follow up [facility] on 10/26/19 Pathology reviewed with pt Doing well. Some constipation. Concerns about PCOS She has used OCP's in the past and Metformin She is working on wt loss  PE AF VSS Lungs clear Heart RRR Abd soft + BS obese GU nl EGBUS cuff healing well  A/P Post OP        PCOS Return to ADL's as tolerates. Unable to take OCP's due to smoker and age. Will try Metformin. Encouraged to continue with wt loss. Discuss wt loss surgery with PCP. Will check A1c. F/U in 3 months

## 2019-11-25 LAB — HEMOGLOBIN A1C
Est. average glucose Bld gHb Est-mCnc: 100 mg/dL
Hgb A1c MFr Bld: 5.1 % (ref 4.8–5.6)

## 2020-01-03 ENCOUNTER — Encounter: Payer: Self-pay | Admitting: Family Medicine

## 2020-01-03 DIAGNOSIS — J441 Chronic obstructive pulmonary disease with (acute) exacerbation: Secondary | ICD-10-CM

## 2020-01-07 ENCOUNTER — Other Ambulatory Visit: Payer: Self-pay | Admitting: Family Medicine

## 2020-01-07 NOTE — Progress Notes (Signed)
Error.  Gerlene Fee, DO 01/07/2020, 10:33 PM PGY-2, [place]

## 2020-01-10 MED ORDER — IBUPROFEN 800 MG PO TABS
800.0000 mg | ORAL_TABLET | Freq: Three times a day (TID) | ORAL | 0 refills | Status: DC
Start: 2020-01-10 — End: 2020-05-24

## 2020-01-10 MED ORDER — ALBUTEROL SULFATE HFA 108 (90 BASE) MCG/ACT IN AERS: 2.0000 | INHALATION_SPRAY | RESPIRATORY_TRACT | 11 refills | Status: DC | PRN

## 2020-01-10 NOTE — Telephone Encounter (Signed)
Called patient however there was no answer. MyChart message sent instead.   [name], DO PGY-2, [facility] 01/10/2020 4:46 PM

## 2020-01-20 ENCOUNTER — Telehealth (INDEPENDENT_AMBULATORY_CARE_PROVIDER_SITE_OTHER): Payer: Medicaid Other | Admitting: Family Medicine

## 2020-01-20 ENCOUNTER — Other Ambulatory Visit: Payer: Self-pay

## 2020-01-20 DIAGNOSIS — E282 Polycystic ovarian syndrome: Secondary | ICD-10-CM | POA: Diagnosis not present

## 2020-01-20 DIAGNOSIS — E781 Pure hyperglyceridemia: Secondary | ICD-10-CM

## 2020-01-20 NOTE — Progress Notes (Signed)
Telehealth Encounter I connected with Mackenzie Gibson (MRN [id number]) on 01/20/2020 by MyChart, along with Iver Nestle, PhD, RD, LDN. I verified that I am speaking with the correct person using two identifiers, and that the patient was in a private environment conducive to confidentiality. The patient agreed to proceed.  Provider was [name] Provider was located at [facility] during this telehealth encounter; Dr. Jenne Campus was at home; patient was at home  Appt start time: 1000 end time: 1042   Reason for telehealth visit: nutrition counseling for PCOS and weight management  Relevant history/background: Patient is a 34 yo female with hx of PCOS, bipolar disease, obesity and hypertriglyceridemia.     Assessment:  Usual eating pattern: 2 meals and 0-1 snacks per day. Frequent foods and beverages: pasta, mac-n-cheese, baked chicken, baked pork chops, water, sweet tea and 2% milk,.   Usual physical activity: walking twice a day, hour walk 3.2 miles . Sleep: Patient estimates average of 6-7  hours of sleep/night.  24-hr recall:  Up at  6AM B (8 AM)- pumpkin spice coffee, sweentened almond milk large,  Snk ( AM)- NA L (1 PM)-  Bacon, cheese, avocado ranch dressing, water simply skinny beverage additive 16 oz,   Snk ( PM)- NA  D (8 PM)- tacos 3: shredding cheese, sour cream, ground beef, tomatoes, lettuce, mild taco sauce, water 16 oz  Snk ( PM)- NA  Typical day? Yes.     Intervention: Completed diet and exercise history, and established: - SMART behavioral goals (Smart Goals: Specific, Measurable, Action-oriented, Realistic, Time-based.)    - Walk 5 times a week for one hour  - Eat protein containing breakfast daily  - 7 times eat a meal that is half vegetables    - Method of documenting progress.  Planner book   For recommendations and goals, see Patient Instructions.    Follow-up: with Dr. Susa Simmonds on 02/01/20  [name]

## 2020-01-20 NOTE — Patient Instructions (Addendum)
  Diet Recommendations for Insulin Resistance  Carbohydrate includes starch, sugar, and fiber.  Of these, only sugar and starch raise blood glucose.  (Fiber is found in fruits, vegetables [especially skin, seeds, and stalks] and whole grains.)   Starchy (carb) foods: Bread, rice, pasta, potatoes, corn, cereal, grits, crackers, bagels, muffins, all baked goods.  (Fruit, milk, and yogurt also have carbohydrate, but most of these foods will not spike your blood sugar as most starchy foods will.)  A few fruits do cause high blood sugars; use small portions of bananas (limit to 1/2 at a time), grapes, watermelon, oranges, and most tropical fruits.   Protein foods: Meat, fish, poultry, eggs, dairy foods, and beans such as pinto and kidney beans (beans also provide carbohydrate).   1. Eat at least REAL 3 meals and 1-2 snacks per day. Never go more than 4-5 hours while awake without eating. Eat breakfast within the first hour of getting up.   2. Limit starchy foods to TWO per meal and ONE per snack. ONE portion of a starchy food is equal to the following:   - ONE slice of bread (or its equivalent, such as half of a hamburger bun).   - 1/2 cup of a "scoopable" starchy food such as potatoes or rice.   - 15 grams of Total Carbohydrate as shown on food label.   - Every 4 ounces of a sweet drink (including fruit juice). 3. Include at every meal: a protein food, a carb food, and vegetables and/or fruit.   - Obtain twice the volume of veg's as protein or carbohydrate foods for both lunch and dinner.   - Fresh or frozen veg's are best.   - Keep frozen veg's on hand for a quick vegetable serving.      GOALS : 1- Walk 5 times a week for one hour  2- Eat protein containing breakfast daily  3- 7 times a week eat a meal that is half vegetables   We will follow up at your scheduled visit on 11/30 in person.  Please be sure to bring your goal documentation with you.

## 2020-02-01 ENCOUNTER — Ambulatory Visit (INDEPENDENT_AMBULATORY_CARE_PROVIDER_SITE_OTHER): Payer: Medicaid Other | Admitting: Family Medicine

## 2020-02-01 ENCOUNTER — Other Ambulatory Visit: Payer: Self-pay

## 2020-02-01 ENCOUNTER — Encounter: Payer: Self-pay | Admitting: Family Medicine

## 2020-02-01 VITALS — BP 124/88 | HR 82 | Ht 67.0 in | Wt 301.0 lb

## 2020-02-01 DIAGNOSIS — L732 Hidradenitis suppurativa: Secondary | ICD-10-CM | POA: Diagnosis not present

## 2020-02-01 DIAGNOSIS — E282 Polycystic ovarian syndrome: Secondary | ICD-10-CM

## 2020-02-01 DIAGNOSIS — R600 Localized edema: Secondary | ICD-10-CM

## 2020-02-01 DIAGNOSIS — D72829 Elevated white blood cell count, unspecified: Secondary | ICD-10-CM | POA: Diagnosis not present

## 2020-02-01 MED ORDER — DOXYCYCLINE HYCLATE 100 MG PO TABS: 100.0000 mg | ORAL_TABLET | Freq: Every day | ORAL | 1 refills | Status: DC

## 2020-02-01 NOTE — Progress Notes (Signed)
   SUBJECTIVE:   CHIEF COMPLAINT / HPI:   Chief Complaint  Patient presents with  . Hypertension  . Weight Check     Mackenzie Gibson is a 34 y.o. female here for BP check.   ? Elevated BP Patient believes her blood pressure may be elevated as her hands are swollen. Does not take medication for hypertension. Denies chest pain, shortness of breath, leg swelling. Endorses chronic headaches.   Dizziness  Patient had a recent nutrition visit with Dr. Jenne Campus and myself. Patient was experiencing dizziness at that time related likely hypoglycemia. Patient reports sx resolved after she starting eating breakfast. She has been eating hard boiled eggs to increase her protein intake. She went off her diet during thanksgiving but still did not drink soda. States her home scale said 245 lb last week and today she has gained weight. Pt with hx of PCOS and obesity.    PERTINENT  PMH / PSH: reviewed   OBJECTIVE:   BP 124/88   Pulse 82   Ht 5\' 7"  (1.702 m)   Wt (!) 301 lb (136.5 kg)   LMP 09/29/2019   SpO2 99%   BMI 47.14 kg/m    GEN: well appearing female, in no acute distress  CV: regular rate and rhythm RESP: no increased work of breathing, clear to ascultation bilaterally ABD: obese abdomen  MSK: right knee: normal ROM, no bony tenderness, no joint line tenderness, no effusion, no edema, normal gait, no LE edema or calf tenderness, mild bilateral non-pitting edema  SKIN: warm, dry    ASSESSMENT/PLAN:   Obesity, morbid (HCC) Body mass index is 47.14 kg/m.Patient not taking metformin 2/2 to GI side effects. Declined ER metformin. Meet with Dr. Jenne Campus, RD, recently. She is working on eating more veggies with meals and increasing her protein intake. Continue to work on goals. Follow up with Dr. Jenne Campus.  - lipid panel today    Leukocytosis Persistent after hysterectomy not likely stress reaction. Patient is a smoker which could also be the cause. Doubt infection. Possibly normal  variant.  - Obtain CBC w/ diff  - Obtain Blood smear   Hidradenitis suppurativa Start 100 mg daily for prophylaxis. Discontinue 50 mg BID as pt's pharmacy not carrying the lower dose. Follow with [facility] as needed. Continue benoxyl peroxide body wash.   Hand edema Mild. Patient able to remove ring. BP normal (124/88). Likely in the setting of excess salt intake during the holidays. Allow to normalize in the next week. If not improving, follow up in clinic.      Mackenzie Hensen, DO PGY-2, [facility] 02/01/2020

## 2020-02-01 NOTE — Assessment & Plan Note (Signed)
Start 100 mg daily for prophylaxis. Discontinue 50 mg BID as pt's pharmacy not carrying the lower dose. Follow with [facility] as needed. Continue benoxyl peroxide body wash.

## 2020-02-01 NOTE — Assessment & Plan Note (Addendum)
Body mass index is 47.14 kg/m.Patient not taking metformin 2/2 to GI side effects. Declined ER metformin. Meet with Dr. Jenne Campus, RD, recently. She is working on eating more veggies with meals and increasing her protein intake. Continue to work on goals. Follow up with Dr. Jenne Campus.  - lipid panel today

## 2020-02-01 NOTE — Assessment & Plan Note (Addendum)
Persistent after hysterectomy not likely stress reaction. Patient is a smoker which could also be the cause. Doubt infection. Possibly normal variant.  - Obtain CBC w/ diff  - Obtain Blood smear

## 2020-02-01 NOTE — Patient Instructions (Signed)
It was great seeing you today! Please check-out at the front desk before leaving the clinic.  Visit Remembers: - Stop by the pharmacy to pick up your prescriptions - Continue to work on your healthy eating habits and incorporating exercise into your daily life. Remember to eat protein at breakfast  - Your goal is to have an BP <120/80 - Medicine Changes: none   Regarding lab work today:  Due to recent changes in healthcare laws, you may see the results of your imaging and laboratory studies on MyChart before your provider has had a chance to review them.  I understand that in some cases there may be results that are confusing or concerning to you. Not all laboratory results come back in the same time frame and you may be waiting for multiple results in order to interpret others.  Please give [name] 72 hours in order for your provider to thoroughly review all the results before contacting the office for clarification of your results. If everything is normal, you will get a letter in the mail or a message in My Chart. Please give [name] a call if you do not hear from [name] after 2 weeks.  Please bring all of your medications with you to each visit.    If you haven't already, sign up for My Chart to have easy access to your labs results, and communication with your primary care physician.  Feel free to call with any questions or concerns at any time, at [phone].   Take care,  Dr. Rushie [facility]

## 2020-02-01 NOTE — Assessment & Plan Note (Addendum)
Mild. Patient able to remove ring. BP normal (124/88). Likely in the setting of excess salt intake during the holidays. Allow to normalize in the next week. If not improving, follow up in clinic.

## 2020-02-02 ENCOUNTER — Encounter: Payer: Self-pay | Admitting: General Practice

## 2020-02-02 LAB — CBC WITH DIFFERENTIAL/PLATELET
Basophils Absolute: 0.1 10*3/uL (ref 0.0–0.2)
Basos: 1 %
EOS (ABSOLUTE): 0.3 10*3/uL (ref 0.0–0.4)
Eos: 3 %
Hematocrit: 41.9 % (ref 34.0–46.6)
Hemoglobin: 13.8 g/dL (ref 11.1–15.9)
Immature Grans (Abs): 0.1 10*3/uL (ref 0.0–0.1)
Immature Granulocytes: 1 %
Lymphocytes Absolute: 2.2 10*3/uL (ref 0.7–3.1)
Lymphs: 23 %
MCH: 27.5 pg (ref 26.6–33.0)
MCHC: 32.9 g/dL (ref 31.5–35.7)
MCV: 84 fL (ref 79–97)
Monocytes Absolute: 0.4 10*3/uL (ref 0.1–0.9)
Monocytes: 4 %
Neutrophils Absolute: 6.6 10*3/uL (ref 1.4–7.0)
Neutrophils: 68 %
Platelets: 191 10*3/uL (ref 150–450)
RBC: 5.01 x10E6/uL (ref 3.77–5.28)
RDW: 13.6 % (ref 11.7–15.4)
WBC: 9.7 10*3/uL (ref 3.4–10.8)

## 2020-02-02 LAB — LIPID PANEL
Chol/HDL Ratio: 4.5 ratio — ABNORMAL HIGH (ref 0.0–4.4)
Cholesterol, Total: 122 mg/dL (ref 100–199)
HDL: 27 mg/dL — ABNORMAL LOW (ref >39)
LDL Chol Calc (NIH): 58 mg/dL (ref 0–99)
Triglycerides: 227 mg/dL — ABNORMAL HIGH (ref 0–149)
VLDL Cholesterol Cal: 37 mg/dL (ref 5–40)

## 2020-02-23 ENCOUNTER — Ambulatory Visit
Admission: EM | Admit: 2020-02-23 | Discharge: 2020-02-23 | Disposition: A | Discharge status: Home / Self Care | Payer: BLUE CROSS/BLUE SHIELD

## 2020-02-23 ENCOUNTER — Other Ambulatory Visit: Payer: Self-pay

## 2020-02-23 DIAGNOSIS — A084 Viral intestinal infection, unspecified: Secondary | ICD-10-CM

## 2020-02-23 NOTE — ED Triage Notes (Signed)
Pt needs work note to return to work after having diarrhea 2 days ago

## 2020-02-23 NOTE — ED Provider Notes (Signed)
[place]   [id number] 02/23/20 Arrival Time: 64  CC: ABDOMINAL DISCOMFORT  SUBJECTIVE:  Mackenzie Gibson is a 34 y.o. female who presented to the urgent care with a complaint of abdominal tenderness, nausea and vomiting that occurred 2 days ago.  Patient states symptoms are now resolved and needed a work note to return to work.  Denies close contacts with similar symptoms, recent travel or antibiotic use.  Has tried OTC medications with relief.  Denies alleviating or aggravating factors.  Denies similar symptoms in the past.   Denies fever, chills, appetite changes, weight changes,  chest pain, SOB,  constipation, hematochezia, melena, dysuria, difficulty urinating, increased frequency or urgency, flank pain, loss of bowel or bladder function, vaginal discharge, vaginal odor, vaginal bleeding, dyspareunia, pelvic pain.     Patient's last menstrual period was 09/29/2019.  ROS: As per HPI.  All other pertinent ROS negative.     Past Medical History:  Diagnosis Date   Anxiety    Bipolar disorder ([place])    Bipolar disorder ([place])    Chronic hypertension in pregnancy 03/02/2015   Depression    Family history of adverse reaction to anesthesia    mom "woke up during" surgery    HPV (human papilloma virus) infection    PCOS (polycystic ovarian syndrome)    PONV (postoperative nausea and vomiting)    Pregnancy induced hypertension    Past Surgical History:  Procedure Laterality Date   CHOLECYSTECTOMY     EYE SURGERY     TUBAL LIGATION Bilateral 03/03/2015   Procedure: POST PARTUM TUBAL LIGATION;  Surgeon: Guss Bunde, MD;  Location: [facility];  Service: Gynecology;  Laterality: Bilateral;   VAGINAL HYSTERECTOMY Bilateral 10/26/2019   Procedure: HYSTERECTOMY VAGINAL;  Surgeon: Chancy Milroy, MD;  Location: [facility];  Service: Gynecology;  Laterality: Bilateral;   Allergies  Allergen Reactions   Hydrocodone Anaphylaxis and Other (See  Comments)     Tongue swelling   No current facility-administered medications on file prior to encounter.   Current Outpatient Medications on File Prior to Encounter  Medication Sig Dispense Refill   albuterol (VENTOLIN HFA) 108 (90 Base) MCG/ACT inhaler Inhale 2 puffs into the lungs every 4 (four) hours as needed for wheezing or shortness of breath. (Patient not taking: Reported on 02/01/2020) 6.7 g 11   Benzoyl Peroxide (PANOXYL FOAMING WASH) 10 % LIQD Apply 1 each topically daily. 140 g 0   doxycycline (VIBRA-TABS) 100 MG tablet Take 1 tablet (100 mg total) by mouth daily. 30 tablet 1   ibuprofen (ADVIL) 800 MG tablet Take 1 tablet (800 mg total) by mouth 3 (three) times daily. (Patient not taking: Reported on 02/01/2020) 30 tablet 0   metFORMIN (GLUCOPHAGE) 500 MG tablet Take one tablet by mouth daily for one week. Then increase to one tablet twice a day for one week.  Then two tablets twice a day. 60 tablet 5   Social History   Socioeconomic History   Marital status: Divorced    Spouse name: Not on file   Number of children: Not on file   Years of education: Not on file   Highest education level: Not on file  Occupational History   Not on file  Tobacco Use   Smoking status: Current Every Day Smoker    Packs/day: 1.00    Types: Cigarettes   Smokeless tobacco: Never Used  Vaping Use   Vaping Use: Never used  Substance and Sexual Activity   Alcohol  use: Yes    Comment: occasionally, once/month   Drug use: No   Sexual activity: Yes    Birth control/protection: None  Other Topics Concern   Not on file  Social History Narrative   Not on file   Social Determinants of Health   Financial Resource Strain: Not on file  Food Insecurity: Food Insecurity Present   Worried About [place] in the Last Year: Sometimes true   Ran Out of Food in the Last Year: Sometimes true  Transportation Needs: No Transportation Needs   Lack of Transportation  (Medical): No   Lack of Transportation (Non-Medical): No  Physical Activity: Not on file  Stress: Not on file  Social Connections: Not on file  Intimate Partner Violence: Not on file   Family History  Problem Relation Age of Onset   Depression Mother    Bipolar disorder Mother    Rheum arthritis Mother    Cervical cancer Mother    Depression Father    High blood pressure Father    Bipolar disorder Maternal Aunt    Cervical cancer Sister    Cervical cancer Maternal Grandmother      OBJECTIVE:  Vitals:   02/23/20 1216  BP: 139/83  Pulse: 83  Resp: 18  Temp: 98.3 F (36.8 C)  SpO2: 98%    General appearance: Alert; NAD HEENT: NCAT.  Oropharynx clear.  Lungs: clear to auscultation bilaterally without adventitious breath sounds Heart: regular rate and rhythm.  Radial pulses 2+ symmetrical bilaterally Abdomen: soft, non-distended; normal active bowel sounds; non-tender to light and deep palpation; nontender at McBurney's point; negative Murphy's sign; negative rebound; no guarding Back: no CVA tenderness Extremities: no edema; symmetrical with no gross deformities Skin: warm and dry Neurologic: normal gait Psychological: alert and cooperative; normal mood and affect  LABS: No results found for this or any previous visit (from the past 24 hour(s)).  DIAGNOSTIC STUDIES: No results found.   ASSESSMENT & PLAN:  1. Viral gastroenteritis     No orders of the defined types were placed in this encounter.  Patient stable at discharge.  Symptom is now resolved.  Work note was given. Discharge instructions  Get rest and drink fluids Work note was given Follow-up with PCP Return or go to ED if you develop any new or worsening of your symptoms If you experience new or worsening symptoms return or go to ER such as fever, chills, nausea, vomiting, diarrhea, bloody or dark tarry stools, constipation, urinary symptoms, worsening abdominal discomfort, symptoms that do  not improve with medications, inability to keep fluids down, etc...  Reviewed expectations re: course of current medical issues. Questions answered. Outlined signs and symptoms indicating need for more acute intervention. Patient verbalized understanding. After Visit Summary given.   Emerson Monte, [place] 02/23/20 1305

## 2020-02-23 NOTE — Discharge Instructions (Addendum)
Get rest and drink fluids Work note was given Follow-up with PCP Return or go to ED if you develop any new or worsening of your symptoms If you experience new or worsening symptoms return or go to ER such as fever, chills, nausea, vomiting, diarrhea, bloody or dark tarry stools, constipation, urinary symptoms, worsening abdominal discomfort, symptoms that do not improve with medications, inability to keep fluids down, etc..Marland Kitchen

## 2020-03-22 DIAGNOSIS — Z20822 Contact with and (suspected) exposure to covid-19: Secondary | ICD-10-CM | POA: Diagnosis not present

## 2020-03-30 DIAGNOSIS — S6991XA Unspecified injury of right wrist, hand and finger(s), initial encounter: Secondary | ICD-10-CM | POA: Diagnosis not present

## 2020-03-30 DIAGNOSIS — M79641 Pain in right hand: Secondary | ICD-10-CM | POA: Diagnosis not present

## 2020-03-30 DIAGNOSIS — M79601 Pain in right arm: Secondary | ICD-10-CM | POA: Diagnosis not present

## 2020-03-31 ENCOUNTER — Telehealth: Payer: Self-pay

## 2020-03-31 NOTE — Telephone Encounter (Signed)
Transition Care Management Follow-up Telephone Call  Date of discharge and from where: 03/30/2020 [facility] ED  How have you been since you were released from the hospital? Doing much better,   Any questions or concerns? No  Items Reviewed:  Did the pt receive and understand the discharge instructions provided? Yes   Medications obtained and verified? Yes   Other? No   Any new allergies since your discharge? No   Dietary orders reviewed? Yes  Do you have support at home? Yes    Functional Questionnaire: (I = Independent and D = Dependent) ADLs: I  Bathing/Dressing- I  Meal Prep- I  Eating- I  Maintaining continence- I  Transferring/Ambulation- I  Managing Meds- I  Follow up appointments reviewed:   PCP Hospital f/u appt confirmed? No  Confirmed [name], DO is PCP. Patient stated she will finished round of prednisone that was ordered and then see if symptoms continue she will call for follow up.  [facility] f/u appt confirmed? No    Are transportation arrangements needed? No   If their condition worsens, is the pt aware to call PCP or go to the Emergency Dept.? Yes  Was the patient provided with contact information for the PCP's office or ED? Yes  Was to pt encouraged to call back with questions or concerns? Yes

## 2020-05-09 ENCOUNTER — Other Ambulatory Visit: Payer: Self-pay

## 2020-05-09 ENCOUNTER — Ambulatory Visit (INDEPENDENT_AMBULATORY_CARE_PROVIDER_SITE_OTHER): Payer: Medicaid Other | Admitting: Family Medicine

## 2020-05-09 ENCOUNTER — Ambulatory Visit
Admission: RE | Admit: 2020-05-09 | Discharge: 2020-05-09 | Disposition: A | Discharge status: Home / Self Care | Payer: Medicaid Other | Source: Ambulatory Visit | Attending: Family Medicine | Admitting: Family Medicine

## 2020-05-09 VITALS — BP 124/90 | HR 83 | Wt 301.8 lb

## 2020-05-09 DIAGNOSIS — M79672 Pain in left foot: Secondary | ICD-10-CM

## 2020-05-09 DIAGNOSIS — S99922A Unspecified injury of left foot, initial encounter: Secondary | ICD-10-CM | POA: Diagnosis not present

## 2020-05-09 DIAGNOSIS — M25572 Pain in left ankle and joints of left foot: Secondary | ICD-10-CM

## 2020-05-09 DIAGNOSIS — M25561 Pain in right knee: Secondary | ICD-10-CM

## 2020-05-09 DIAGNOSIS — M7989 Other specified soft tissue disorders: Secondary | ICD-10-CM | POA: Diagnosis not present

## 2020-05-09 NOTE — Progress Notes (Deleted)
    SUBJECTIVE:   CHIEF COMPLAINT / HPI:   Knee pain    PERTINENT  PMH / PSH: ***  OBJECTIVE:   BP 124/90   Pulse 83   Wt (!) 301 lb 12.8 oz (136.9 kg)   LMP 09/29/2019   SpO2 98%   BMI 47.27 kg/m   ***  ASSESSMENT/PLAN:   No problem-specific Assessment & Plan notes found for this encounter.     Gifford Shave, MD [place]   {    This will disappear when note is signed, click to select method of visit    :1}

## 2020-05-09 NOTE — Progress Notes (Signed)
    SUBJECTIVE:   CHIEF COMPLAINT / HPI:   Right Knee pain  Patient reports chronic right knee pain which is worsened over the past few weeks.  She reported she tried to exercise to stretch the knee out which sometimes helps with the pain but it is now swollen and extremely painful.  The knee pain has caused her to fall.  She reports that she had an x-ray in 2019 which showed some arthritis but no other findings.  No acute injury to the knee.  Left ankle and pinky toe pain  Patient reports that the cause of her right knee pain she has been walking differently and she fell 2 days ago.  When she landed she twisted her ankle and hurt her pinky toe.  The pinky toe is bruised and is swollen.   OBJECTIVE:   BP 124/90   Pulse 83   Wt (!) 301 lb 12.8 oz (136.9 kg)   LMP 09/29/2019   SpO2 98%   BMI 47.27 kg/m   General: Well-appearing 35 year old female in no acute distress Respiratory: Normal work of breathing MSK: Right knee with joint effusion, no obvious bruising, erythema, warmth.  Tender to palpation along medial and lateral joint lines.  Pain with varus and valgus pressure along the joint line.  Left ankle is tender on the lateral aspect and left fifth digit is bruised and edematous. ASSESSMENT/PLAN:   Acute left ankle pain Patient fell approximately 2 days ago and has swelling in her foot and ankle.  Most likely a sprain but given injury and physical exam will obtain x-rays of the ankle as well as the foot to rule out any fractures.  Recommended rest, elevation, ice and NSAIDs.  Follow-up as needed.  Right knee pain Patient reports history of arthritis in the right knee.  Physical exam consistent with arthritic flare.  Discussed a knee injection and patient would not like this at this time.  We will obtain imaging of the right knee to verify no acute injury and a referral has been placed for sports medicine.  She will consider a steroid injection and may get one at the sports medicine  clinic.     Gifford Shave, MD [place]

## 2020-05-09 NOTE — Patient Instructions (Signed)
It was great seeing you today.  I think your right knee pain is most likely due to osteoarthritis.  We will get an x-ray of it to confirm.  I believe you would benefit from a steroid injection in the knee.  Please consider this.  I have sent a referral in for you to see sports medicine so they can help you with a knee sleeve and possibly give you an injection.  Regarding your left ankle I am going to get some x-rays of that.  Please continue ibuprofen or Tylenol for the pain, ice, heat, elevation.  I will call you with those results or post them to your MyChart.  If you have any questions, concerns please feel free to call the clinic.  Happy of wonderful afternoon!

## 2020-05-10 DIAGNOSIS — M25561 Pain in right knee: Secondary | ICD-10-CM | POA: Insufficient documentation

## 2020-05-10 DIAGNOSIS — M25572 Pain in left ankle and joints of left foot: Secondary | ICD-10-CM | POA: Insufficient documentation

## 2020-05-10 NOTE — Assessment & Plan Note (Signed)
Patient fell approximately 2 days ago and has swelling in her foot and ankle.  Most likely a sprain but given injury and physical exam will obtain x-rays of the ankle as well as the foot to rule out any fractures.  Recommended rest, elevation, ice and NSAIDs.  Follow-up as needed.

## 2020-05-10 NOTE — Assessment & Plan Note (Signed)
Patient reports history of arthritis in the right knee.  Physical exam consistent with arthritic flare.  Discussed a knee injection and patient would not like this at this time.  We will obtain imaging of the right knee to verify no acute injury and a referral has been placed for sports medicine.  She will consider a steroid injection and may get one at the sports medicine clinic.

## 2020-05-12 ENCOUNTER — Ambulatory Visit: Payer: Medicaid Other | Admitting: Family Medicine

## 2020-05-17 ENCOUNTER — Ambulatory Visit: Payer: Medicaid Other | Admitting: Family Medicine

## 2020-05-24 ENCOUNTER — Other Ambulatory Visit: Payer: Self-pay

## 2020-05-24 ENCOUNTER — Ambulatory Visit (INDEPENDENT_AMBULATORY_CARE_PROVIDER_SITE_OTHER): Payer: Medicaid Other | Admitting: Family Medicine

## 2020-05-24 ENCOUNTER — Encounter: Payer: Self-pay | Admitting: Family Medicine

## 2020-05-24 VITALS — BP 155/84 | Ht 67.0 in | Wt 301.0 lb

## 2020-05-24 DIAGNOSIS — M25561 Pain in right knee: Secondary | ICD-10-CM | POA: Diagnosis not present

## 2020-05-24 MED ORDER — DICLOFENAC SODIUM 75 MG PO TBEC: 75.0000 mg | DELAYED_RELEASE_TABLET | Freq: Two times a day (BID) | ORAL | 1 refills | Status: DC

## 2020-05-24 NOTE — Patient Instructions (Signed)
Your pain is due to synovitis, very mild arthritis. These are the different medications you can take for this: Tylenol 500mg  1-2 tabs three times a day for pain. Capsaicin, aspercreme, or biofreeze topically up to four times a day may also help with pain. Diclofenac 75mg  twice a day with food - do not take the meloxicam, ibuprofen, or aleve with this. Cortisone injections are an option with or without draining your knee. It's important that you continue to stay active. Straight leg raises, knee extensions 3 sets of 10 once a day (add ankle weight if these become too easy). Consider physical therapy to strengthen muscles around the joint that hurts to take pressure off of the joint itself. Shoe inserts with good arch support may be helpful. Heat or ice 15 minutes at a time 3-4 times a day as needed to help with pain. Follow up with me in 1 month (sooner if you're struggling).

## 2020-05-24 NOTE — Progress Notes (Signed)
PCP: [name], DO  Subjective:   HPI: Patient is a 35 y.o. female here for right knee pain.  Patient reports for over a month she's had anterior right knee pain. Associated swelling that is worse at the end of the day. No acute injury or trauma. Had issues with this knee 2-3 years ago and recalls having it aspirated with improvement. Also reports chronic right wrist pain with some persistent swelling (over 20 years) with burning quality of pain. Having right ankle pain as well. Has a sister with rheumatoid arthritis.  Past Medical History:  Diagnosis Date  . Anxiety   . Bipolar disorder ([place])   . Bipolar disorder ([place])   . 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 Depression   . Family history of adverse reaction to anesthesia    mom "woke up during" surgery   . HPV (human papilloma virus) infection   . PCOS (polycystic ovarian syndrome)   . PONV (postoperative nausea and vomiting)   . Pregnancy induced hypertension     Current Outpatient Medications on File Prior to Visit  Medication Sig Dispense Refill  . albuterol (VENTOLIN HFA) 108 (90 Base) MCG/ACT inhaler Inhale 2 puffs into the lungs every 4 (four) hours as needed for wheezing or shortness of breath. (Patient not taking: Reported on 02/01/2020) 6.7 g 11  . Benzoyl Peroxide (PANOXYL FOAMING WASH) 10 % LIQD Apply 1 each topically daily. 140 g 0  . doxycycline (VIBRA-TABS) 100 MG tablet Take 1 tablet (100 mg total) by mouth daily. 30 tablet 1  . metFORMIN (GLUCOPHAGE) 500 MG tablet Take one tablet by mouth daily for one week. Then increase to one tablet twice a day for one week.  Then two tablets twice a day. 60 tablet 5   No current facility-administered medications on file prior to visit.    Past Surgical History:  Procedure Laterality Date  . CHOLECYSTECTOMY    . EYE SURGERY    . TUBAL LIGATION Bilateral 03/03/2015   Procedure: POST PARTUM TUBAL LIGATION;  Surgeon: Guss Bunde, MD;  Location: [facility];  Service: Gynecology;  Laterality: Bilateral;  . VAGINAL HYSTERECTOMY Bilateral 10/26/2019   Procedure: HYSTERECTOMY VAGINAL;  Surgeon: Chancy Milroy, MD;  Location: [facility];  Service: Gynecology;  Laterality: Bilateral;    Allergies  Allergen Reactions  . Hydrocodone Anaphylaxis and Other (See Comments)     Tongue swelling    Social History   Socioeconomic History  . Marital status: Divorced    Spouse name: Not on file  . Number of children: Not on file  . Years of education: Not on file  . Highest education level: Not on file  Occupational History  . Not on file  Tobacco Use  . Smoking status: Current Every Day Smoker    Packs/day: 1.00    Types: Cigarettes  . Smokeless tobacco: Never Used  Vaping Use  . Vaping Use: Never used  Substance and Sexual Activity  . Alcohol use: Yes    Comment: occasionally, once/month  . Drug use: No  . Sexual activity: Yes    Birth control/protection: None  Other Topics Concern  . Not on file  Social History Narrative  . Not on file   Social Determinants of Health   Financial Resource Strain: Not on file  Food Insecurity: Food Insecurity Present  . Worried About Charity fundraiser in the Last Year: Sometimes true  . Ran Out of Food in the Last Year: Sometimes true  Transportation Needs: No Transportation Needs  . Lack of Transportation (Medical): No  . Lack of Transportation (Non-Medical): No  Physical Activity: Not on file  Stress: Not on file  Social Connections: Not on file  Intimate Partner Violence: Not on file    Family History  Problem Relation Age of Onset  . Depression Mother   . Bipolar disorder Mother   . Rheum arthritis Mother   . Cervical cancer Mother   . Depression Father   . High blood pressure Father   . Bipolar disorder Maternal Aunt   . Cervical cancer Sister   . Cervical cancer Maternal Grandmother     BP (!) 155/84   Ht 5\' 7"  (1.702 m)   Wt (!) 301 lb (136.5 kg)   LMP  09/29/2019   BMI 47.14 kg/m   No flowsheet data found.  No flowsheet data found.  Review of Systems: See HPI above.     Objective:  Physical Exam:  Gen: NAD, comfortable in exam room  Right knee: Mild effusion.  No other gross deformity, ecchymoses. TTP medial and lateral joint lines, post patellar facets. FROM with normal strength. Negative ant/post drawers. Negative valgus/varus testing. Negative lachman. Negative mcmurrays, apleys. NV intact distally.  Limited MSK u/s right knee: confirmed small knee effusion, anechoic.   Assessment & Plan:  1. Right knee pain - independently reviewed radiographs - very mild arthropathy noted medially and laterally but these were not weight bearing.  She does have an effusion but exam is otherwise reassuring. Consistent with synovitis, possible flare of her mild arthritis.  Discussed conservative measures - icing, tylenol, topical medications, oral diclofenac.  Consider aspiration and/or injection if not improving, physical therapy.  Consider the possibility of rheumatologic cause but no warmth, synovitis of right wrist or ankle.  F/u in 1 month.

## 2020-05-29 ENCOUNTER — Other Ambulatory Visit: Payer: Self-pay

## 2020-05-29 ENCOUNTER — Ambulatory Visit (INDEPENDENT_AMBULATORY_CARE_PROVIDER_SITE_OTHER): Payer: Medicaid Other | Admitting: Family Medicine

## 2020-05-29 ENCOUNTER — Encounter: Payer: Self-pay | Admitting: Family Medicine

## 2020-05-29 DIAGNOSIS — Z1159 Encounter for screening for other viral diseases: Secondary | ICD-10-CM | POA: Diagnosis not present

## 2020-05-29 DIAGNOSIS — F17200 Nicotine dependence, unspecified, uncomplicated: Secondary | ICD-10-CM

## 2020-05-29 DIAGNOSIS — M25561 Pain in right knee: Secondary | ICD-10-CM

## 2020-05-29 DIAGNOSIS — Z114 Encounter for screening for human immunodeficiency virus [HIV]: Secondary | ICD-10-CM

## 2020-05-29 DIAGNOSIS — E282 Polycystic ovarian syndrome: Secondary | ICD-10-CM

## 2020-05-29 LAB — POCT GLYCOSYLATED HEMOGLOBIN (HGB A1C): Hemoglobin A1C: 4.8 % (ref 4.0–5.6)

## 2020-05-29 MED ORDER — DICLOFENAC SODIUM 3 % EX GEL: 2.0000 g | Freq: Four times a day (QID) | CUTANEOUS | 1 refills | Status: DC | PRN

## 2020-05-29 NOTE — Progress Notes (Unsigned)
     SUBJECTIVE:   Chief compliant/HPI: annual examination  Mackenzie Gibson is a 34 y.o. who presents today for an annual exam.   Reviewed and updated history.   Review of systems form notable for right knee pain and swelling. She recently had a work-up at Sports Medicine.   Pt has decreased her cigarrette intake and is working on quitting smoking. Denies alcohol use and illicit drug use.    OBJECTIVE:   BP 136/69   Pulse 88   Ht 5' 7" (1.702 m)   Wt (!) 300 lb 3.2 oz (136.2 kg)   LMP 09/29/2019   SpO2 99%   BMI 47.02 kg/m    GEN: pleasant, well appearing female, in no acute distress  CV: regular rate and rhythm, no murmurs appreciated  RESP: no increased work of breathing, clear to ascultation bilaterally ABD: Obese abdomen,. Soft, Nontender, Nondistended.  MSK: no LE edema, or calf tenderness  SKIN: warm, dry NEURO: moves all extremities appropriately PSYCH: Normal affect, appropriate speech and behavior    ASSESSMENT/PLAN:   Tobacco use disorder Patient gradually decreasing her cigarette intake. She is motivated to quit. Smoking cessation instruction/counseling given:  counseled patient on the dangers of tobacco use, advised patient to stop smoking, and reviewed strategies to maximize success.    Right knee pain Following with Sports Medicine. States her insurance did not cover the NSAID prescribed. Continue Tylenol TID PRN. Start Voltaren gel QID PRN. Defer additional work up to sports medicine.   Morbid obesity (HCC) Pt with hx of PCOS and morbid obesity. Pt would like to lose weight. Previously met with registered dietician. Referral placed to healthy weight and wellness. Repeat a1c today. Reviewed non-fasting lipid panel from Nov 2021. Follows with GYN for PCOS.     Annual Examination  See AVS for age appropriate recommendations  PHQ score 3, reviewed and discussed.  Blood pressure reviewed and at near goal.      Considered the following items based  upon USPSTF recommendations: HIV testing: ordered Hepatitis C: requested Hepatitis B: not indicated Syphilis if at high risk: {declined GC/CTdeclined Lipid panel (nonfasting or fasting) discussed based upon AHA recommendations and not ordered.  Reviewed from Nov 2021. Consider repeat every 4-6 years.  Reviewed risk factors for latent tuberculosis and not indicated. 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  Immunizations   Follow up in 6 months or sooner if indicated.    Vondra Brimage, DO [facility]         

## 2020-05-29 NOTE — Patient Instructions (Signed)
It was great seeing you today!  Please check-out at the front desk before leaving the clinic. I'd like to see you back for any new issues we're happy to fit you in, just give [name] a call!  For right knee pain: Start applying the topical pain relieving gel. Continue taking Tylenol 1000 mg three times a day as needed.   We discussed weight loss and you were healthy weight and wellness clinic.   Visit Remembers: - Stop by the pharmacy to pick up your prescriptions  - Continue to work on your healthy eating habits and incorporating exercise into your daily life.  - Your goal is to have an BP <120/80 - Medicine Changes: Start Voltaren gel for knee pain.  - To Do: Be sure to follow up with the Sports Medicine clinic for your right knee pain.    Regarding lab work today:  Due to recent changes in healthcare laws, you may see the results of your imaging and laboratory studies on MyChart before your provider has had a chance to review them.  I understand that in some cases there may be results that are confusing or concerning to you. Not all laboratory results come back in the same time frame and you may be waiting for multiple results in order to interpret others.  Please give [name] 72 hours in order for your provider to thoroughly review all the results before contacting the office for clarification of your results. If everything is normal, you will get a letter in the mail or a message in My Chart. Please give [name] a call if you do not hear from [name] after 2 weeks.  Please bring all of your medications with you to each visit.    If you haven't already, sign up for My Chart to have easy access to your labs results, and communication with your primary care physician.  Feel free to call with any questions or concerns at any time, at [phone].   Take care,  Dr. Rushie [facility]

## 2020-05-30 LAB — HEPATITIS C ANTIBODY: Hep C Virus Ab: <0.1 s/co ratio (ref 0.0–0.9)

## 2020-05-30 LAB — HIV ANTIBODY (ROUTINE TESTING W REFLEX): HIV Screen 4th Generation wRfx: NONREACTIVE

## 2020-05-31 ENCOUNTER — Encounter: Payer: Self-pay | Admitting: Family Medicine

## 2020-05-31 NOTE — Assessment & Plan Note (Addendum)
Pt with hx of PCOS and morbid obesity. Pt would like to lose weight. Previously met with registered dietician. Referral placed to [facility]. Repeat a1c today. Reviewed non-fasting lipid panel from Nov 2021. Follows with GYN for PCOS.

## 2020-05-31 NOTE — Assessment & Plan Note (Signed)
Following with Sports Medicine. States her insurance did not cover the NSAID prescribed. Continue Tylenol TID PRN. Start Voltaren gel QID PRN. Defer additional work up to sports medicine.

## 2020-05-31 NOTE — Assessment & Plan Note (Signed)
Patient gradually decreasing her cigarette intake. She is motivated to quit. Smoking cessation instruction/counseling given:  counseled patient on the dangers of tobacco use, advised patient to stop smoking, and reviewed strategies to maximize success.

## 2020-05-31 NOTE — Assessment & Plan Note (Signed)
>>  ASSESSMENT AND PLAN FOR TOBACCO USE DISORDER WRITTEN ON 05/31/2020  9:18 AM BY BRIMAGE, VONDRA, DO  Patient gradually decreasing her cigarette intake. She is motivated to quit. Smoking cessation instruction/counseling given:  counseled patient on the dangers of tobacco use, advised patient to stop smoking, and reviewed strategies to maximize success.

## 2020-06-15 ENCOUNTER — Encounter: Payer: Self-pay | Admitting: Family Medicine

## 2020-06-26 ENCOUNTER — Ambulatory Visit (INDEPENDENT_AMBULATORY_CARE_PROVIDER_SITE_OTHER): Payer: Medicaid Other | Admitting: Family Medicine

## 2020-06-26 ENCOUNTER — Other Ambulatory Visit: Payer: Self-pay

## 2020-06-26 ENCOUNTER — Encounter: Payer: Self-pay | Admitting: Family Medicine

## 2020-06-26 VITALS — BP 137/83 | Ht 67.0 in | Wt 300.0 lb

## 2020-06-26 DIAGNOSIS — M25561 Pain in right knee: Secondary | ICD-10-CM

## 2020-06-26 NOTE — Patient Instructions (Signed)
We will go ahead with an MRI of your right knee. Follow up with me (ideally in the afternoon) after the MRI for a no charge visit to go over results. Start the diclofenac gel your family doctor prescribed. Icing, compression, elevation as needed.

## 2020-06-26 NOTE — Progress Notes (Signed)
PCP: [name], DO  Subjective:   HPI: Patient is a 34 y.o. female here for right knee pain.  3/23: Patient reports for over a month she's had anterior right knee pain. Associated swelling that is worse at the end of the day. No acute injury or trauma. Had issues with this knee 2-3 years ago and recalls having it aspirated with improvement. Also reports chronic right wrist pain with some persistent swelling (over 20 years) with burning quality of pain. Having right ankle pain as well. Has a sister with rheumatoid arthritis.  4/25: Patient reports she feels about the same compared to last visit. Knee swells a lot especially with activity and by end of day. Better when seated and after a little bit of mobility. Pain medial more than lateral right knee. Joint pain throbbing at times. Unable to get oral diclofenac due to insurance but had topical voltaren sent in.  Past Medical History:  Diagnosis Date  . Anxiety   . Bipolar disorder ([place])   . Bipolar disorder ([place])   . 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 Depression   . Family history of adverse reaction to anesthesia    mom "woke up during" surgery   . HPV (human papilloma virus) infection   . Morbid obesity ([place])   . PCOS (polycystic ovarian syndrome)   . PONV (postoperative nausea and vomiting)   . Pregnancy induced hypertension     Current Outpatient Medications on File Prior to Visit  Medication Sig Dispense Refill  . albuterol (VENTOLIN HFA) 108 (90 Base) MCG/ACT inhaler Inhale 2 puffs into the lungs every 4 (four) hours as needed for wheezing or shortness of breath. (Patient not taking: Reported on 02/01/2020) 6.7 g 11  . Benzoyl Peroxide (PANOXYL FOAMING WASH) 10 % LIQD Apply 1 each topically daily. 140 g 0  . diclofenac (VOLTAREN) 75 MG EC tablet Take 1 tablet (75 mg total) by mouth 2 (two) times daily. 60 tablet 1  . Diclofenac Sodium 3 % GEL Apply 2 g topically 4 (four) times daily as needed. 100  g 1  . doxycycline (VIBRA-TABS) 100 MG tablet Take 1 tablet (100 mg total) by mouth daily. 30 tablet 1  . metFORMIN (GLUCOPHAGE) 500 MG tablet Take one tablet by mouth daily for one week. Then increase to one tablet twice a day for one week.  Then two tablets twice a day. 60 tablet 5   No current facility-administered medications on file prior to visit.    Past Surgical History:  Procedure Laterality Date  . CHOLECYSTECTOMY    . EYE SURGERY    . TUBAL LIGATION Bilateral 03/03/2015   Procedure: POST PARTUM TUBAL LIGATION;  Surgeon: Guss Bunde, MD;  Location: [facility];  Service: Gynecology;  Laterality: Bilateral;  . VAGINAL HYSTERECTOMY Bilateral 10/26/2019   Procedure: HYSTERECTOMY VAGINAL;  Surgeon: Chancy Milroy, MD;  Location: [facility];  Service: Gynecology;  Laterality: Bilateral;    Allergies  Allergen Reactions  . Hydrocodone Anaphylaxis and Other (See Comments)     Tongue swelling    Social History   Socioeconomic History  . Marital status: Divorced    Spouse name: Not on file  . Number of children: Not on file  . Years of education: Not on file  . Highest education level: Not on file  Occupational History  . Not on file  Tobacco Use  . Smoking status: Current Every Day Smoker    Packs/day: 0.50    Types: Cigarettes  .  Smokeless tobacco: Never Used  Vaping Use  . Vaping Use: Never used  Substance and Sexual Activity  . Alcohol use: Yes    Comment: occasionally, once/month  . Drug use: No  . Sexual activity: Yes    Birth control/protection: None  Other Topics Concern  . Not on file  Social History Narrative  . Not on file   Social Determinants of Health   Financial Resource Strain: Not on file  Food Insecurity: Food Insecurity Present  . Worried About Charity fundraiser in the Last Year: Sometimes true  . Ran Out of Food in the Last Year: Sometimes true  Transportation Needs: No Transportation Needs  . Lack of Transportation  (Medical): No  . Lack of Transportation (Non-Medical): No  Physical Activity: Not on file  Stress: Not on file  Social Connections: Not on file  Intimate Partner Violence: Not on file    Family History  Problem Relation Age of Onset  . Depression Mother   . Bipolar disorder Mother   . Rheum arthritis Mother   . Cervical cancer Mother   . Depression Father   . High blood pressure Father   . Bipolar disorder Maternal Aunt   . Cervical cancer Sister   . Cervical cancer Maternal Grandmother     BP 137/83   Ht 5\' 7"  (1.702 m)   Wt 300 lb (136.1 kg)   LMP 09/29/2019   BMI 46.99 kg/m   No flowsheet data found.  No flowsheet data found.  Review of Systems: See HPI above.     Objective:  Physical Exam:  Gen: NAD, comfortable in exam room  Right knee: Minimal effusion.  No other gross deformity, ecchymoses, swelling. TTP medial > lateral joint line and post patellar facets. FROM with normal strength. Negative ant/post drawers. Negative valgus/varus testing. Negative lachman. Negative mcmurrays, apleys. NV intact distally.  Assessment & Plan:  1. Right knee pain - radiographs with very mild arthropathy but not weight bearing.  Today with less of an effusion than last visit, not enough to aspirate for analysis.  She declined steroid injection trial as gets very symptomatic with hyperglycemia.  Will go ahead with MRI to further assess given lack of improvement with conservative treatment.  Continue icing, topical medications including voltaren gel in meantime.  Consider possibility of rheumatologic cause as well.

## 2020-07-19 ENCOUNTER — Other Ambulatory Visit: Payer: Medicaid Other

## 2020-08-02 ENCOUNTER — Ambulatory Visit (INDEPENDENT_AMBULATORY_CARE_PROVIDER_SITE_OTHER): Payer: Self-pay | Admitting: Family Medicine

## 2020-08-02 DIAGNOSIS — Z1331 Encounter for screening for depression: Secondary | ICD-10-CM

## 2020-08-02 DIAGNOSIS — E7849 Other hyperlipidemia: Secondary | ICD-10-CM

## 2020-08-02 DIAGNOSIS — R0602 Shortness of breath: Secondary | ICD-10-CM

## 2020-08-02 DIAGNOSIS — R5383 Other fatigue: Secondary | ICD-10-CM

## 2020-08-16 ENCOUNTER — Ambulatory Visit (INDEPENDENT_AMBULATORY_CARE_PROVIDER_SITE_OTHER): Payer: Self-pay | Admitting: Family Medicine

## 2020-08-24 ENCOUNTER — Other Ambulatory Visit: Payer: Self-pay

## 2020-08-24 ENCOUNTER — Ambulatory Visit (INDEPENDENT_AMBULATORY_CARE_PROVIDER_SITE_OTHER): Payer: Medicaid Other | Admitting: Family Medicine

## 2020-08-24 ENCOUNTER — Encounter (INDEPENDENT_AMBULATORY_CARE_PROVIDER_SITE_OTHER): Payer: Self-pay | Admitting: Family Medicine

## 2020-08-24 VITALS — BP 121/86 | HR 86 | Temp 97.9°F | Ht 68.0 in | Wt 301.0 lb

## 2020-08-24 DIAGNOSIS — Z1331 Encounter for screening for depression: Secondary | ICD-10-CM

## 2020-08-24 DIAGNOSIS — E282 Polycystic ovarian syndrome: Secondary | ICD-10-CM | POA: Diagnosis not present

## 2020-08-24 DIAGNOSIS — Z0289 Encounter for other administrative examinations: Secondary | ICD-10-CM

## 2020-08-24 DIAGNOSIS — R6889 Other general symptoms and signs: Secondary | ICD-10-CM | POA: Diagnosis not present

## 2020-08-24 DIAGNOSIS — F3160 Bipolar disorder, current episode mixed, unspecified: Secondary | ICD-10-CM | POA: Diagnosis not present

## 2020-08-24 DIAGNOSIS — I159 Secondary hypertension, unspecified: Secondary | ICD-10-CM | POA: Insufficient documentation

## 2020-08-24 DIAGNOSIS — Z720 Tobacco use: Secondary | ICD-10-CM

## 2020-08-24 DIAGNOSIS — R5383 Other fatigue: Secondary | ICD-10-CM | POA: Insufficient documentation

## 2020-08-24 DIAGNOSIS — R0602 Shortness of breath: Secondary | ICD-10-CM | POA: Insufficient documentation

## 2020-08-24 DIAGNOSIS — Z6841 Body Mass Index (BMI) 40.0 and over, adult: Secondary | ICD-10-CM

## 2020-08-25 LAB — COMPREHENSIVE METABOLIC PANEL
ALT: 16 IU/L (ref 0–32)
AST: 11 IU/L (ref 0–40)
Albumin/Globulin Ratio: 1.6 (ref 1.2–2.2)
Albumin: 4.5 g/dL (ref 3.8–4.8)
Alkaline Phosphatase: 75 IU/L (ref 44–121)
BUN/Creatinine Ratio: 17 (ref 9–23)
BUN: 11 mg/dL (ref 6–20)
Bilirubin Total: 0.3 mg/dL (ref 0.0–1.2)
CO2: 23 mmol/L (ref 20–29)
Calcium: 9.3 mg/dL (ref 8.7–10.2)
Chloride: 102 mmol/L (ref 96–106)
Creatinine, Ser: 0.66 mg/dL (ref 0.57–1.00)
Globulin, Total: 2.8 g/dL (ref 1.5–4.5)
Glucose: 77 mg/dL (ref 65–99)
Potassium: 4.6 mmol/L (ref 3.5–5.2)
Sodium: 140 mmol/L (ref 134–144)
Total Protein: 7.3 g/dL (ref 6.0–8.5)
eGFR: 117 mL/min/{1.73_m2} (ref >59)

## 2020-08-25 LAB — LIPID PANEL
Chol/HDL Ratio: 4.6 ratio — ABNORMAL HIGH (ref 0.0–4.4)
Cholesterol, Total: 123 mg/dL (ref 100–199)
HDL: 27 mg/dL — ABNORMAL LOW (ref >39)
LDL Chol Calc (NIH): 60 mg/dL (ref 0–99)
Triglycerides: 221 mg/dL — ABNORMAL HIGH (ref 0–149)
VLDL Cholesterol Cal: 36 mg/dL (ref 5–40)

## 2020-08-25 LAB — CBC WITH DIFFERENTIAL/PLATELET
Basophils Absolute: 0.1 10*3/uL (ref 0.0–0.2)
Basos: 1 %
EOS (ABSOLUTE): 0.4 10*3/uL (ref 0.0–0.4)
Eos: 3 %
Hematocrit: 44.5 % (ref 34.0–46.6)
Hemoglobin: 14.6 g/dL (ref 11.1–15.9)
Immature Grans (Abs): 0.1 10*3/uL (ref 0.0–0.1)
Immature Granulocytes: 1 %
Lymphocytes Absolute: 2.7 10*3/uL (ref 0.7–3.1)
Lymphs: 24 %
MCH: 28.3 pg (ref 26.6–33.0)
MCHC: 32.8 g/dL (ref 31.5–35.7)
MCV: 86 fL (ref 79–97)
Monocytes Absolute: 0.5 10*3/uL (ref 0.1–0.9)
Monocytes: 4 %
Neutrophils Absolute: 7.3 10*3/uL — ABNORMAL HIGH (ref 1.4–7.0)
Neutrophils: 67 %
Platelets: 223 10*3/uL (ref 150–450)
RBC: 5.15 x10E6/uL (ref 3.77–5.28)
RDW: 13.8 % (ref 11.7–15.4)
WBC: 11.1 10*3/uL — ABNORMAL HIGH (ref 3.4–10.8)

## 2020-08-25 LAB — FOLATE: Folate: 3.8 ng/mL (ref >3.0)

## 2020-08-25 LAB — VITAMIN D 25 HYDROXY (VIT D DEFICIENCY, FRACTURES): Vit D, 25-Hydroxy: 27.6 ng/mL — ABNORMAL LOW (ref 30.0–100.0)

## 2020-08-25 LAB — TSH: TSH: 2.95 u[IU]/mL (ref 0.450–4.500)

## 2020-08-25 LAB — T4, FREE: Free T4: 1.2 ng/dL (ref 0.82–1.77)

## 2020-08-25 LAB — HEMOGLOBIN A1C
Est. average glucose Bld gHb Est-mCnc: 97 mg/dL
Hgb A1c MFr Bld: 5 % (ref 4.8–5.6)

## 2020-08-25 LAB — VITAMIN B12: Vitamin B-12: 327 pg/mL (ref 232–1245)

## 2020-08-25 LAB — INSULIN, RANDOM: INSULIN: 23.5 u[IU]/mL (ref 2.6–24.9)

## 2020-08-31 NOTE — Progress Notes (Signed)
Dear Dr. Wendy Gibson,   Thank you for referring Mackenzie Gibson to our clinic. The following note includes my evaluation and treatment recommendations.  Chief Complaint:   Mackenzie Gibson (MR# [id number]) is a 35 y.o. female who presents for evaluation and treatment of obesity and related comorbidities. Current BMI is Body mass index is 45.77 kg/m. Mackenzie Gibson has been struggling with her weight for many years and has been unsuccessful in either losing weight, maintaining weight loss, or reaching her healthy weight goal.  Mackenzie Gibson is currently in the action stage of change and ready to dedicate time achieving and maintaining a healthier weight. [employer] Gibson is interested in becoming our patient and working on intensive lifestyle modifications including (but not limited to) diet and exercise for weight loss.  Mackenzie Gibson lives with her 3 children, ages 10/8, and 5, and her fiance, [employer].  She works at [employer] for 30 hours per week.  She hopes to lose 120 pounds in 1 year.  Identifies PCOS as the reason for her weight gain.  Weight Watchers was used in the past.  She says that exercise was key to her weight loss in the past.  Craves sweets in the evenings.  Snacks on chips, chocolate, candy/sweets, which is her "worst habit".  Mackenzie Gibson's habits were reviewed today and are as follows: Her family eats meals together, her desired weight loss is 120 pounds, she has been heavy most of her life, she started gaining weight at age 58, her heaviest weight ever was 300 pounds, she craves sweets, she is frequently drinking liquids with calories, she frequently makes poor food choices, and she struggles with emotional eating.  Depression Screen Mackenzie Gibson's Food and Mood (modified PHQ-9) score was 15.  Depression screen [employer] Gibson 2/9 08/24/2020  Decreased Interest 3  Down, Depressed, Hopeless 3  PHQ - 2 Score 6  Altered sleeping 1  Tired, decreased energy 3  Change in appetite 1  Feeling bad or failure  about yourself  2  Trouble concentrating 2  Moving slowly or fidgety/restless 0  Suicidal thoughts 0  PHQ-9 Score 15  Difficult doing work/chores Somewhat difficult  Some recent data might be hidden   Assessment/Plan:   Orders Placed This Encounter  Procedures   Vitamin B12   CBC with Differential/Platelet   Comprehensive metabolic panel   Folate   Hemoglobin A1c   Insulin, random   Lipid panel   T4, free   TSH   VITAMIN D 25 Hydroxy (Vit-D Deficiency, Fractures)   EKG 12-Lead   Medications Discontinued During This Encounter  Medication Reason   albuterol (VENTOLIN HFA) 108 (90 Base) MCG/ACT inhaler    Benzoyl Peroxide (PANOXYL FOAMING WASH) 10 % LIQD    diclofenac (VOLTAREN) 75 MG EC tablet    Diclofenac Sodium 3 % GEL    doxycycline (VIBRA-TABS) 100 MG tablet    metFORMIN (GLUCOPHAGE) 500 MG tablet    1. Other fatigue Mackenzie Gibson admits to daytime somnolence and reports waking up still tired. Patent has a history of symptoms of daytime fatigue, morning fatigue, and snoring. [employer] generally gets 6 or 7 hours of sleep per night, and states that she has generally restful sleep. Snoring is present. Apneic episodes are present. Epworth Sleepiness Score is 8.  Mackenzie Gibson does feel that her weight is causing her energy to be lower than it should be. Fatigue may be related to obesity, depression or many other causes. Labs will be ordered, and in the meanwhile, Mackenzie Gibson will focus on self  care including making healthy food choices, increasing physical activity and focusing on stress reduction.  Check EKG and labs today.  - EKG 12-Lead - Vitamin B12 - CBC with Differential/Platelet - Comprehensive metabolic panel - Folate - Lipid panel - T4, free - TSH - VITAMIN D 25 Hydroxy (Vit-D Deficiency, Fractures)  2. SOBOE (shortness of breath on exertion) [employer]enzie Gibson notes increasing shortness of breath with exercising and seems to be worsening over time with weight gain. She notes  getting out of breath sooner with activity than she used to. This has gotten worse recently. [employer]enzie Gibson denies shortness of breath at rest or orthopnea.  M[employer]enzie Gibson does feel that she gets out of breath more easily that she used to when she exercises. [employer]enzie Gibson's shortness of breath appears to be obesity related and exercise induced. She has agreed to work on weight loss and gradually increase exercise to treat her exercise induced shortness of breath. Will continue to monitor closely.  Will check IC and labs today.  - Vitamin B12 - CBC with Differential/Platelet - Comprehensive metabolic panel - Folate - Lipid panel - T4, free - TSH - VITAMIN D 25 Hydroxy (Vit-D Deficiency, Fractures)  3. h/o Secondary hypertension ( w/ pregnancy) Per patient, she has history of hypertension with and after pregnancy.  She was on blood pressure medication for 6 months or so and then came off.  Plan:  Blood pressure is at goal today.  Will monitor.  Decrease salt.  Follow prudent nutritional plan and weight loss.  Will check labs today.  BP Readings from Last 3 Encounters:  08/24/20 121/86  06/26/20 137/83  05/29/20 136/69   Lab Results  Component Value Date   CREATININE 0.66 08/24/2020   - Vitamin B12 - CBC with Differential/Platelet - Comprehensive metabolic panel - Folate - Lipid panel - T4, free - TSH - VITAMIN D 25 Hydroxy (Vit-D Deficiency, Fractures)  4. PCOS (polycystic ovarian syndrome) Mackenzie Gibson identifies PCOS as the reason for her obesity.  No medications now or in the past for this.  Diagnosed by OB/GYN.  Plan:  Consider metformin in the future if warranted.  Weight loss via prudent nutritional plan.  She will continue to focus on protein-rich, low simple carbohydrate foods. We reviewed the importance of hydration, regular exercise for stress reduction, and restorative sleep. Will check A1c and insulin level today.  Counseling PCOS is a leading cause of menstrual irregularities and  infertility. It is also associated with obesity, hirsutism (excessive hair growth on the face, chest, or back), and cardiovascular risk factors such as high cholesterol and insulin resistance. Insulin resistance appears to play a central role.  Women with PCOS have been shown to have impaired appetite-regulating hormones. Metformin is one medication that can improve metabolic parameters.  Women with polycystic ovary syndrome (PCOS) have an increased risk for cardiovascular disease (CVD) - European Journal of Preventive Cardiology.  - Hemoglobin A1c - Insulin, random  5. Tobacco abuse Course: [employer]enzie Gibson has smoked 1 pack per day for 14 years.  She recently cut back to 1/2 pack per day.   Plan: counseled patient on the dangers of tobacco use, advised patient to stop smoking, and reviewed strategies to maximize success.  6. Bipolar disorder, mixed, with emotional eating Medication: None. With GAD.  Some emotional eating - eats when bored.  PHQ-9 is 15.  She had therapists in the past, but has not seen in ~1 year.  Increased stress lately.  She declines medication.  Management per PCP, she says.  Plan:  I recommend she re-establish with a counselor, follow-up with PCP if she feels emotions are worsening.  Stable now.  Has always declined medications for treatment.Discussed cues and consequences, how thoughts affect eating, model of thoughts, feelings, and behaviors, and strategies for change by focusing on the cue. Discussed cognitive distortions, coping thoughts, and how to change your thoughts.  7. Depression screening [employer] Gibson was screened for depression as part of her new patient workup today.  PHQ-9 is 15.  Mackenzie Gibson had a positive depression screening. Depression is commonly associated with obesity and often results in emotional eating behaviors. We will monitor this closely and work on CBT to help improve the non-hunger eating patterns. Referral to Psychology may be required if no improvement  is seen as she continues in our clinic.  8. Class 3 severe obesity with serious comorbidity and body mass index (BMI) of 45.0 to 49.9 in adult, unspecified obesity type ([place])  Mackenzie Gibson is currently in the action stage of change and her goal is to continue with weight loss efforts. I recommend [employer]enzie Gibson begin the structured treatment plan as follows:  She has agreed to the Category 4 Plan.  Exercise goals:  As is.    Behavioral modification strategies: increasing lean protein intake, decreasing simple carbohydrates, decreasing liquid calories, decreasing eating out, no skipping meals, and planning for success.  She was informed of the importance of frequent follow-up visits to maximize her success with intensive lifestyle modifications for her multiple health conditions. She was informed we would discuss her lab results at her next visit unless there is a critical issue that needs to be addressed sooner. [employer] agreed to keep her next visit at the agreed upon time to discuss these results.  Objective:   Blood pressure 121/86, pulse 86, temperature 97.9 F (36.6 C), height 5\' 8"  (1.727 m), weight (!) 301 lb (136.5 kg), last menstrual period 09/29/2019, SpO2 98 %. Body mass index is 45.77 kg/m.  EKG: Normal sinus rhythm, rate 82 bpm.  Indirect Calorimeter completed today shows a VO2 of 409 and a REE of 2822.  Her calculated basal metabolic rate is 8657 thus her basal metabolic rate is better than expected.  General: Cooperative, alert, well developed, in no acute distress. HEENT: Conjunctivae and lids unremarkable. Cardiovascular: Regular rhythm.  Lungs: Normal work of breathing. Neurologic: No focal deficits.   Lab Results  Component Value Date   CREATININE 0.66 08/24/2020   BUN 11 08/24/2020   NA 140 08/24/2020   K 4.6 08/24/2020   CL 102 08/24/2020   CO2 23 08/24/2020   Lab Results  Component Value Date   ALT 16 08/24/2020   AST 11 08/24/2020   ALKPHOS 75 08/24/2020    BILITOT 0.3 08/24/2020   Lab Results  Component Value Date   HGBA1C 5.0 08/24/2020   HGBA1C 4.8 05/29/2020   HGBA1C 5.1 11/24/2019   HGBA1C 4.9 05/26/2019   HGBA1C 4.9 01/02/2018   Lab Results  Component Value Date   INSULIN 23.5 08/24/2020   Lab Results  Component Value Date   TSH 2.950 08/24/2020   Lab Results  Component Value Date   CHOL 123 08/24/2020   HDL 27 (L) 08/24/2020   LDLCALC 60 08/24/2020   TRIG 221 (H) 08/24/2020   CHOLHDL 4.6 (H) 08/24/2020   Lab Results  Component Value Date   VD25OH 27.6 (L) 08/24/2020   Lab Results  Component Value Date   WBC 11.1 (H) 08/24/2020   HGB 14.6 08/24/2020   HCT 44.5 08/24/2020  MCV 86 08/24/2020   PLT 223 08/24/2020   Attestation Statements:   This is the patient's first visit at [facility]. The patient's NEW PATIENT PACKET was reviewed at length. Included in the packet: current and past health history, medications, allergies, ROS, gynecologic history (women only), surgical history, family history, social history, weight history, weight loss surgery history (for those that have had weight loss surgery), nutritional evaluation, mood and food questionnaire, PHQ9, Epworth questionnaire, sleep habits questionnaire, patient life and health improvement goals questionnaire. These will all be scanned into the patient's chart under media.   During the visit, I independently reviewed the patient's EKG, bioimpedance scale results, and indirect calorimeter results. I used this information to tailor a meal plan for the patient that will help her to lose weight and will improve her obesity-related conditions going forward. I performed a medically necessary appropriate examination and/or evaluation. I discussed the assessment and treatment plan with the patient. The patient was provided an opportunity to ask questions and all were answered. The patient agreed with the plan and demonstrated an understanding of the  instructions. Labs were ordered at this visit and will be reviewed at the next visit unless more critical results need to be addressed immediately. Clinical information was updated and documented in the EMR.   Time spent on visit including pre-visit chart review and post-visit charting and care was 48 minutes.   I, Water quality scientist, CMA, am acting as Location manager for [employer], DO.  I have reviewed the above documentation for accuracy and completeness, and I agree with the above. Marjory Sneddon, D.O.  [employer] [place] was signed into law in 2016 which includes the topic of electronic health records.  This provides immediate access to information in MyChart.  This includes consultation notes, operative notes, office notes, lab results and pathology reports.  If you have any questions about what you read please let us know at your next visit so we can discuss your concerns and take corrective action if need be.  We are right here with you.

## 2020-09-07 ENCOUNTER — Other Ambulatory Visit: Payer: Self-pay

## 2020-09-07 ENCOUNTER — Ambulatory Visit (INDEPENDENT_AMBULATORY_CARE_PROVIDER_SITE_OTHER): Payer: Medicaid Other | Admitting: Family Medicine

## 2020-09-07 VITALS — BP 125/79 | HR 82 | Temp 97.9°F | Ht 68.0 in | Wt 301.0 lb

## 2020-09-07 DIAGNOSIS — E559 Vitamin D deficiency, unspecified: Secondary | ICD-10-CM | POA: Diagnosis not present

## 2020-09-07 DIAGNOSIS — E8881 Metabolic syndrome: Secondary | ICD-10-CM

## 2020-09-07 DIAGNOSIS — E781 Pure hyperglyceridemia: Secondary | ICD-10-CM | POA: Diagnosis not present

## 2020-09-07 DIAGNOSIS — Z6841 Body Mass Index (BMI) 40.0 and over, adult: Secondary | ICD-10-CM

## 2020-09-07 MED ORDER — VITAMIN D (ERGOCALCIFEROL) 1.25 MG (50000 UNIT) PO CAPS: 50000.0000 [IU] | ORAL_CAPSULE | ORAL | 0 refills | Status: DC

## 2020-09-07 MED ORDER — METFORMIN HCL 500 MG PO TABS: 500.0000 mg | ORAL_TABLET | Freq: Every day | ORAL | 0 refills | Status: DC

## 2020-09-17 DIAGNOSIS — H5213 Myopia, bilateral: Secondary | ICD-10-CM | POA: Diagnosis not present

## 2020-09-20 DIAGNOSIS — J020 Streptococcal pharyngitis: Secondary | ICD-10-CM | POA: Diagnosis not present

## 2020-09-20 DIAGNOSIS — U071 COVID-19: Secondary | ICD-10-CM | POA: Diagnosis not present

## 2020-09-20 DIAGNOSIS — R059 Cough, unspecified: Secondary | ICD-10-CM | POA: Diagnosis not present

## 2020-09-20 DIAGNOSIS — R0981 Nasal congestion: Secondary | ICD-10-CM | POA: Diagnosis not present

## 2020-09-20 NOTE — Progress Notes (Signed)
Chief Complaint:   Mackenzie Gibson is here to discuss her progress with her obesity treatment plan along with follow-up of her obesity related diagnoses.   Today's visit was #: 2 Starting weight: 301 lbs Starting date: 08/24/2020 Today's weight: 301 lbs Today's date: 09/07/2020 Weight change since last visit: 0 Total lbs lost to date: 0 Body mass index is 45.77 kg/m.   Interim History:  Mackenzie Gibson is here today for her first follow-up office visit since starting the program with [name].  All blood work/ lab tests that were recently ordered by myself or an outside provider were reviewed with patient today per their request.   Extended time was spent counseling her on all new disease processes that were discovered or preexisting ones that are worsening.  she understands that many of these abnormalities will need to monitored regularly along with the current treatment plan of prudent dietary changes, in which we are making each and every office visit, to improve these health parameters.  Unable to follow meal plan because she was too busy and says she works too much.  Skips meals or unable to follow plan.  Eats [employer] for dinner most days.  Plan:  she has used MFP in the past.  Since she is eating a lot of off plan foods, she will change to journaling.  She will bring in a log of date, calories per day, and protein per day.  Current Meal Plan: the Category 4 Plan for 30% of the time.  Current Exercise Plan: None.  Assessment/Plan:   Meds ordered this encounter  Medications   Vitamin D, Ergocalciferol, (DRISDOL) 1.25 MG (50000 UNIT) CAPS capsule    Sig: Take 1 capsule (50,000 Units total) by mouth every 7 (seven) days.    Dispense:  4 capsule    Refill:  0   metFORMIN (GLUCOPHAGE) 500 MG tablet    Sig: Take 1 tablet (500 mg total) by mouth daily with breakfast.    Dispense:  30 tablet    Refill:  0    No rf until OV    1. Hypertriglyceridemia Course: Not at goal.  Lipid-lowering medications: None.  With low HDL.  Plan:  New.  Discussed labs with patient today.  Dietary changes: Increase soluble fiber, decrease simple carbohydrates, decrease saturated fat. Exercise changes: Moderate to vigorous-intensity aerobic activity 150 minutes per week or as tolerated. We will continue to monitor along with PCP/specialists as it pertains to her weight loss journey.  Counseling done.  Follow prudent nutritional plan, eventually increase exercise, decrease simple carbs.  Lab Results  Component Value Date   CHOL 123 08/24/2020   HDL 27 (L) 08/24/2020   LDLCALC 60 08/24/2020   TRIG 221 (H) 08/24/2020   CHOLHDL 4.6 (H) 08/24/2020   Lab Results  Component Value Date   ALT 16 08/24/2020   AST 11 08/24/2020   ALKPHOS 75 08/24/2020   BILITOT 0.3 08/24/2020   2. Insulin resistance At goal. Goal is HgbA1c < 5.7, fasting insulin closer to 5.  Medication: metformin 500 mg daily.  In the past, she used metformin and lost weight with it.  Tolerated it well.  She says that in 2 months, she lost 50 pounds.  She has taken it for PCOS and weight loss in the past.  Plan:  New.  Discussed labs with patient today.  Start metformin 500 mg daily (she will start 1/2 tablet daily for 2-4 days and then increase if tolerated).  She  will continue to focus on protein-rich, low simple carbohydrate foods. We reviewed the importance of hydration, regular exercise for stress reduction, and restorative sleep.    Lab Results  Component Value Date   HGBA1C 5.0 08/24/2020   Lab Results  Component Value Date   INSULIN 23.5 08/24/2020   - Start metFORMIN (GLUCOPHAGE) 500 MG tablet; Take 1 tablet (500 mg total) by mouth daily with breakfast.  Dispense: 30 tablet; Refill: 0  3. Vitamin D deficiency Not at goal.  She is not currently taking a vitamin D supplement.  Plan:  New.  Discussed labs with patient today.    - Discussed importance of vitamin D to their health and well-being.  -  possible symptoms of low Vitamin D can be low energy, depressed mood, muscle aches, joint aches, osteoporosis etc. - low Vitamin D levels may be linked to an increased risk of cardiovascular events and even increased risk of cancers- such as colon and breast.  - I recommend pt take weekly prescription vit D - see script below   - Informed patient this may be a lifelong thing, and she was encouraged to continue to take the medicine until told otherwise.   - we will need to monitor levels regularly (every 3-4 mo on average) to keep levels within normal limits.  - weight loss will likely improve availability of vitamin D, thus encouraged [name] to continue with meal plan and their weight loss efforts to further improve this condition - pt's questions and concerns regarding this condition addressed.  Lab Results  Component Value Date   VD25OH 27.6 (L) 08/24/2020   - Start Vitamin D, Ergocalciferol, (DRISDOL) 1.25 MG (50000 UNIT) CAPS capsule; Take 1 capsule (50,000 Units total) by mouth every 7 (seven) days.  Dispense: 4 capsule; Refill: 0  4. Class 3 severe obesity with serious comorbidity and body mass index (BMI) of 45.0 to 49.9 in adult, unspecified obesity type [facility])  Course: Mackenzie Gibson is currently in the action stage of change. As such, her goal is to continue with weight loss efforts.   Nutrition goals: She has agreed to keeping a food journal and adhering to recommended goals of 2000-2200 calories and 120+ grams of protein.   Exercise goals:  As is.  Behavioral modification strategies: decreasing simple carbohydrates, decreasing liquid calories, no skipping meals, keeping healthy foods in the home, and planning for success.  [name] has agreed to follow-up with our clinic in 2 weeks with first available provider.  She agrees that every 2 week visits are needed. She was informed of the importance of frequent follow-up visits to maximize her success with intensive lifestyle modifications  for her multiple health conditions.   Objective:   Blood pressure 125/79, pulse 82, temperature 97.9 F (36.6 C), height 5\' 8"  (1.727 m), weight (!) 301 lb (136.5 kg), last menstrual period 09/29/2019, SpO2 98 %. Body mass index is 45.77 kg/m.  General: Cooperative, alert, well developed, in no acute distress. HEENT: Conjunctivae and lids unremarkable. Cardiovascular: Regular rhythm.  Lungs: Normal work of breathing. Neurologic: No focal deficits.   Lab Results  Component Value Date   CREATININE 0.66 08/24/2020   BUN 11 08/24/2020   NA 140 08/24/2020   K 4.6 08/24/2020   CL 102 08/24/2020   CO2 23 08/24/2020   Lab Results  Component Value Date   ALT 16 08/24/2020   AST 11 08/24/2020   ALKPHOS 75 08/24/2020   BILITOT 0.3 08/24/2020   Lab Results  Component Value Date  HGBA1C 5.0 08/24/2020   HGBA1C 4.8 05/29/2020   HGBA1C 5.1 11/24/2019   HGBA1C 4.9 05/26/2019   HGBA1C 4.9 01/02/2018   Lab Results  Component Value Date   INSULIN 23.5 08/24/2020   Lab Results  Component Value Date   TSH 2.950 08/24/2020   Lab Results  Component Value Date   CHOL 123 08/24/2020   HDL 27 (L) 08/24/2020   LDLCALC 60 08/24/2020   TRIG 221 (H) 08/24/2020   CHOLHDL 4.6 (H) 08/24/2020   Lab Results  Component Value Date   VD25OH 27.6 (L) 08/24/2020   Lab Results  Component Value Date   WBC 11.1 (H) 08/24/2020   HGB 14.6 08/24/2020   HCT 44.5 08/24/2020   MCV 86 08/24/2020   PLT 223 08/24/2020   Attestation Statements:   Reviewed by clinician on day of visit: allergies, medications, problem list, medical history, surgical history, family history, social history, and previous encounter notes.  Time spent on visit including pre-visit chart review and post-visit care and charting was 40 minutes.   I, Water quality scientist, CMA, am acting as Location manager for [employer], DO.  I have reviewed the above documentation for accuracy and completeness, and I agree with the above.  Marjory Sneddon, D.O.  The [place] was signed into law in 2016 which includes the topic of electronic health records.  This provides immediate access to information in MyChart.  This includes consultation notes, operative notes, office notes, lab results and pathology reports.  If you have any questions about what you read please let us know at your next visit so we can discuss your concerns and take corrective action if need be.  We are right here with you.

## 2020-09-21 ENCOUNTER — Encounter (INDEPENDENT_AMBULATORY_CARE_PROVIDER_SITE_OTHER): Payer: Self-pay | Admitting: Family Medicine

## 2020-09-21 ENCOUNTER — Telehealth (INDEPENDENT_AMBULATORY_CARE_PROVIDER_SITE_OTHER): Payer: Self-pay

## 2020-09-21 ENCOUNTER — Telehealth (INDEPENDENT_AMBULATORY_CARE_PROVIDER_SITE_OTHER): Payer: Medicaid Other | Admitting: Family Medicine

## 2020-09-21 ENCOUNTER — Other Ambulatory Visit: Payer: Self-pay

## 2020-09-21 DIAGNOSIS — Z6841 Body Mass Index (BMI) 40.0 and over, adult: Secondary | ICD-10-CM

## 2020-09-21 DIAGNOSIS — K5900 Constipation, unspecified: Secondary | ICD-10-CM | POA: Diagnosis not present

## 2020-09-21 DIAGNOSIS — E559 Vitamin D deficiency, unspecified: Secondary | ICD-10-CM | POA: Diagnosis not present

## 2020-09-21 DIAGNOSIS — E8881 Metabolic syndrome: Secondary | ICD-10-CM | POA: Diagnosis not present

## 2020-09-21 MED ORDER — VITAMIN D (ERGOCALCIFEROL) 1.25 MG (50000 UNIT) PO CAPS: 50000.0000 [IU] | ORAL_CAPSULE | ORAL | 0 refills | Status: DC

## 2020-09-21 MED ORDER — POLYETHYLENE GLYCOL 3350 17 GM/SCOOP PO POWD: 17.0000 g | Freq: Every day | ORAL | 1 refills | Status: DC | PRN

## 2020-09-21 MED ORDER — METFORMIN HCL 500 MG PO TABS: 500.0000 mg | ORAL_TABLET | Freq: Every day | ORAL | 0 refills | Status: DC

## 2020-09-21 NOTE — Telephone Encounter (Signed)
See my chart message

## 2020-09-28 DIAGNOSIS — E8881 Metabolic syndrome: Secondary | ICD-10-CM | POA: Insufficient documentation

## 2020-09-28 DIAGNOSIS — E88819 Insulin resistance, unspecified: Secondary | ICD-10-CM | POA: Insufficient documentation

## 2020-09-28 DIAGNOSIS — E559 Vitamin D deficiency, unspecified: Secondary | ICD-10-CM | POA: Insufficient documentation

## 2020-09-28 NOTE — Progress Notes (Signed)
TeleHealth Visit:  Due to the COVID-19 pandemic, this visit was completed with telemedicine (audio/video) technology to reduce patient and provider exposure as well as to preserve personal protective equipment.   Mackenzie Gibson has verbally consented to this TeleHealth visit. The patient is located at home, the provider is located at the [facility] office. The participants in this visit include the listed provider and patient. The visit was conducted today via video.  Chief Complaint: Mackenzie Gibson is here to discuss her progress with her obesity treatment plan along with follow-up of her obesity related diagnoses. Mackenzie Gibson is on keeping a food journal and adhering to recommended goals of 2000-2200 calories and 120 grams of protein and states she is following her eating plan approximately 90% of the time. Mackenzie Gibson states she is walking 9,000-10,000 steps all day 5 times per week.  Today's visit was #: 3 Starting weight: 301 lbs Starting date: 08/24/2020  Interim History: Mackenzie Gibson currently has COVID and strep. She has been following Category 4 and is down 3 lbs (298 lbs at home today). 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 She has cut out frappes. She is packing food to take to work. She will go to [employer] for her birthday and plans on cheating.  She is on Paxlovid and Amoxicillin for strep and Covid.  Subjective:   1. Vitamin D deficiency Mackenzie Gibson's Vitamin D was low at 27.6. She is on weekly Vitamin D.  Lab Results  Component Value Date   VD25OH 27.6 (L) 08/24/2020    2. Insulin resistance Mackenzie Gibson is taking 250 mg of Metformin. She felt light headed but feels this is because she was getting ill.  Lab Results  Component Value Date   INSULIN 23.5 08/24/2020   Lab Results  Component Value Date   HGBA1C 5.0 08/24/2020    3. Constipation, unspecified constipation type Mackenzie Gibson notes constipation since starting on plan.  She is needing to strain to have a bowel movement. She denies blood in stool.   Assessment/Plan:   1. Vitamin D deficiency Low Vitamin D level contributes to fatigue and are associated with obesity, breast, and colon cancer. She agrees to continue to take prescription Vitamin D 50,000 IU every week. We will refill Vitamin D for 1 month with no refills and she will follow-up for routine testing of Vitamin D, at least 2-3 times per year to avoid over-replacement.  - Vitamin D, Ergocalciferol, (DRISDOL) 1.25 MG (50000 UNIT) CAPS capsule; Take 1 capsule (50,000 Units total) by mouth every 7 (seven) days.  Dispense: 4 capsule; Refill: 0  2. Insulin resistance We will refill Metformin 500 mg daily with breakfast. She can take 1/2 tablet until tolerating well and then increase to 500 mg.   - metFORMIN (GLUCOPHAGE) 500 MG tablet; Take 1 tablet (500 mg total) by mouth daily with breakfast.  Dispense: 30 tablet; Refill: 0  3. Constipation, unspecified constipation type Mackenzie Gibson will start Miralax PRN. May take daily.   - polyethylene glycol powder (GLYCOLAX/MIRALAX) 17 GM/SCOOP powder; Take 17 g by mouth daily as needed.  Dispense: 3350 g; Refill: 1  4. Obesity: Current BMI  Mackenzie Gibson is currently in the action stage of change. As such, her goal is to continue with weight loss efforts. She has agreed to keeping a food journal and adhering to recommended goals of 2000-2200 calories and 120 grams of protein daily.  Exercise goals:  As is.  Behavioral modification strategies: meal planning and  cooking strategies and better snacking choices.  Mackenzie Gibson has agreed to follow-up with our clinic in 2-3 weeks.  Objective:   VITALS: Per patient if applicable, see vitals. GENERAL: Alert and in no acute distress. CARDIOPULMONARY: No increased WOB. Speaking in clear sentences.  PSYCH: Pleasant and cooperative. Speech normal rate and rhythm. Affect is appropriate. Insight and judgement are appropriate.  Attention is focused, linear, and appropriate.  NEURO: Oriented as arrived to appointment on time with no prompting.   Lab Results  Component Value Date   CREATININE 0.66 08/24/2020   BUN 11 08/24/2020   NA 140 08/24/2020   K 4.6 08/24/2020   CL 102 08/24/2020   CO2 23 08/24/2020   Lab Results  Component Value Date   ALT 16 08/24/2020   AST 11 08/24/2020   ALKPHOS 75 08/24/2020   BILITOT 0.3 08/24/2020   Lab Results  Component Value Date   HGBA1C 5.0 08/24/2020   HGBA1C 4.8 05/29/2020   HGBA1C 5.1 11/24/2019   HGBA1C 4.9 05/26/2019   HGBA1C 4.9 01/02/2018   Lab Results  Component Value Date   INSULIN 23.5 08/24/2020   Lab Results  Component Value Date   TSH 2.950 08/24/2020   Lab Results  Component Value Date   CHOL 123 08/24/2020   HDL 27 (L) 08/24/2020   LDLCALC 60 08/24/2020   TRIG 221 (H) 08/24/2020   CHOLHDL 4.6 (H) 08/24/2020   Lab Results  Component Value Date   VD25OH 27.6 (L) 08/24/2020   Lab Results  Component Value Date   WBC 11.1 (H) 08/24/2020   HGB 14.6 08/24/2020   HCT 44.5 08/24/2020   MCV 86 08/24/2020   PLT 223 08/24/2020   No results found for: IRON, TIBC, FERRITIN  Attestation Statements:   Reviewed by clinician on day of visit: allergies, medications, problem list, medical history, surgical history, family history, social history, and previous encounter notes.  I, Lizbeth Bark, RMA, am acting as Location manager for [employer], [place].   I have reviewed the above documentation for accuracy and completeness, and I agree with the above. - Georgianne Fick, FNP

## 2020-10-12 ENCOUNTER — Encounter (INDEPENDENT_AMBULATORY_CARE_PROVIDER_SITE_OTHER): Payer: Self-pay | Admitting: Family Medicine

## 2020-10-12 ENCOUNTER — Other Ambulatory Visit: Payer: Self-pay

## 2020-10-12 ENCOUNTER — Ambulatory Visit (INDEPENDENT_AMBULATORY_CARE_PROVIDER_SITE_OTHER): Payer: Medicaid Other | Admitting: Family Medicine

## 2020-10-12 VITALS — BP 121/80 | HR 79 | Temp 97.8°F | Ht 68.0 in | Wt 296.0 lb

## 2020-10-12 DIAGNOSIS — E559 Vitamin D deficiency, unspecified: Secondary | ICD-10-CM | POA: Diagnosis not present

## 2020-10-12 DIAGNOSIS — Z6841 Body Mass Index (BMI) 40.0 and over, adult: Secondary | ICD-10-CM

## 2020-10-12 DIAGNOSIS — E8881 Metabolic syndrome: Secondary | ICD-10-CM | POA: Diagnosis not present

## 2020-10-12 DIAGNOSIS — M5441 Lumbago with sciatica, right side: Secondary | ICD-10-CM

## 2020-10-12 MED ORDER — VITAMIN D (ERGOCALCIFEROL) 1.25 MG (50000 UNIT) PO CAPS: 50000.0000 [IU] | ORAL_CAPSULE | ORAL | 0 refills | Status: DC

## 2020-10-12 MED ORDER — METFORMIN HCL 500 MG PO TABS
500.0000 mg | ORAL_TABLET | Freq: Every day | ORAL | 0 refills | Status: DC
Start: 2020-10-12 — End: 2020-11-01

## 2020-10-16 NOTE — Progress Notes (Signed)
Chief Complaint:   Mackenzie Gibson is here to discuss her progress with her obesity treatment plan along with follow-up of her obesity related diagnoses. Mackenzie Gibson is on keeping a food journal and adhering to recommended goals of 2000-2200 calories and 120 grams of protein and states she is following her eating plan approximately 80% of the time. Mackenzie Gibson states she is doing 0 minutes 0 times per week.  Today's visit was #: 4 Starting weight: 301 lbs Starting date: 08/24/2020 Today's weight: 296 lbs Today's date: 10/12/2020 Total lbs lost to date: 5 lbs Total lbs lost since last in-office visit: 5 lbs  Interim History: Mackenzie Gibson is journaling and/or following Category 4. She was not sure what her protein goal should be. She packs food to take to work.She denies intake of sweet tea or soda.  Subjective:   1. Vitamin D deficiency Mackenzie Gibson Vitamin D was low at 27.6 on weekly Vitamin D prescription.  Lab Results  Component Value Date   VD25OH 27.6 (L) 08/24/2020    2. Insulin resistance Mackenzie Gibson hunger is well controlled on Metformin. Mackenzie Gibson states Metformin helps with appetite.  She is taking 250 mg every day.  Lab Results  Component Value Date   INSULIN 23.5 08/24/2020   Lab Results  Component Value Date   HGBA1C 5.0 08/24/2020    3. Acute right-sided low back pain with right-sided sciatica Mackenzie Gibson describes right lower back pain that radiates to right leg and groin. She states it exacerbated by standing at work. Pain relieved by 800 mg ibuprofen.  Assessment/Plan:   1. Vitamin D deficiency She agrees to continue to take prescription Vitamin D 50,000 IU every week and will follow-up for routine testing of Vitamin D, at least 2-3 times per year to avoid over-replacement.  - Vitamin D, Ergocalciferol, (DRISDOL) 1.25 MG (50000 UNIT) CAPS capsule; Take 1 capsule (50,000 Units total) by mouth every 7 (seven) days.  Dispense: 4 capsule; Refill: 0  2. Insulin  resistance Mackenzie Gibson will continue to work on weight loss, exercise, and decreasing simple carbohydrates to help decrease the risk of diabetes. We will refill Metformin 500 mg every day for 1 month with no refills. She may take 250 mg twice daily. Mackenzie Gibson agreed to follow-up with [name] as directed to closely monitor her progress.  - metFORMIN (GLUCOPHAGE) 500 MG tablet; Take 1 tablet (500 mg total) by mouth daily with breakfast.  Dispense: 30 tablet; Refill: 0  3. Acute right-sided low back pain with right-sided sciatica Mackenzie Gibson may take 800 mg (200 mg x 4 pills) OTC ibuprofen with food every 8 hr PRN for one week. See PCP if pain continues past one week.   4. Obesity: Current BMI 45.02 Mackenzie Gibson is currently in the action stage of change. As such, her goal is to continue with weight loss efforts. She has agreed to the Category 4 Plan or keeping a food journal and adhering to recommended goals of 2000-2200 calories and 120 grams of protein daily.   Mackenzie Gibson may have one protein shake per day. I demonstrated how to use my fitness pal to Mackenzie Gibson.  Exercise goals: No exercise has been prescribed at this time.  Behavioral modification strategies: increasing lean protein intake.  Mackenzie Gibson has agreed to follow-up with our clinic in 2-3 weeks.  Objective:   Blood pressure 121/80, pulse 79, temperature 97.8 F (36.6 C), height '5\' 8"'$  (1.727 m), weight 296 lb (134.3 kg), last menstrual period 09/29/2019, SpO2 98 %. Body mass index is 45.01 kg/m.  General: Cooperative, alert, well developed, in no acute distress. HEENT: Conjunctivae and lids unremarkable. Cardiovascular: Regular rhythm.  Lungs: Normal work of breathing. Neurologic: No focal deficits.   Lab Results  Component Value Date   CREATININE 0.66 08/24/2020   BUN 11 08/24/2020   NA 140 08/24/2020   K 4.6 08/24/2020   CL 102 08/24/2020   CO2 23 08/24/2020   Lab Results  Component Value Date   ALT 16 08/24/2020   AST 11 08/24/2020    ALKPHOS 75 08/24/2020   BILITOT 0.3 08/24/2020   Lab Results  Component Value Date   HGBA1C 5.0 08/24/2020   HGBA1C 4.8 05/29/2020   HGBA1C 5.1 11/24/2019   HGBA1C 4.9 05/26/2019   HGBA1C 4.9 01/02/2018   Lab Results  Component Value Date   INSULIN 23.5 08/24/2020   Lab Results  Component Value Date   TSH 2.950 08/24/2020   Lab Results  Component Value Date   CHOL 123 08/24/2020   HDL 27 (L) 08/24/2020   LDLCALC 60 08/24/2020   TRIG 221 (H) 08/24/2020   CHOLHDL 4.6 (H) 08/24/2020   Lab Results  Component Value Date   VD25OH 27.6 (L) 08/24/2020   Lab Results  Component Value Date   WBC 11.1 (H) 08/24/2020   HGB 14.6 08/24/2020   HCT 44.5 08/24/2020   MCV 86 08/24/2020   PLT 223 08/24/2020   No results found for: IRON, TIBC, FERRITIN  Attestation Statements:   Reviewed by clinician on day of visit: allergies, medications, problem list, medical history, surgical history, family history, social history, and previous encounter notes.  I, Lizbeth Bark, RMA, am acting as Location manager for [employer], [place].   I have reviewed the above documentation for accuracy and completeness, and I agree with the above. -  Georgianne Fick, FNP

## 2020-10-19 ENCOUNTER — Telehealth: Payer: Self-pay

## 2020-10-19 NOTE — Telephone Encounter (Signed)
Patient calls nurse line requesting lice shampoo. Reports that she has tried Nix shampoo and Rid lice shampoo, however, reports that these have not been working.   Please advise if alternative lice shampoo can be sent to the pharmacy.   *Patient states that she is getting over [place] and would like to avoid coming into the office. We also do not have any appointments until 8/26.  Please advise.   Talbot Grumbling, RN

## 2020-10-20 ENCOUNTER — Encounter: Payer: Self-pay | Admitting: Family Medicine

## 2020-10-23 NOTE — Progress Notes (Deleted)
    SUBJECTIVE:   CHIEF COMPLAINT / HPI: head lice  Head lice: Patient reports that her child developed lice on ***. They have tried Nix and Rid without improvement.   Healthcare Maintenance: Covid 3rd shot, pap smear  PERTINENT  PMH / PSH: non-contributory  OBJECTIVE:   LMP 09/29/2019   Nursing note and vitals reviewed GEN: *** resting comfortably in chair, NAD, WNWD HEENT: NCAT. PERRLA. Sclera without injection or icterus. MMM.  Neck: Supple.  Cardiac: Regular rate and rhythm. Normal S1/S2. No murmurs, rubs, or gallops appreciated. 2+ radial pulses. Lungs: Clear bilaterally to ascultation. No increased WOB, no accessory muscle usage. No w/r/r. Abdomen: Normoactive bowel sounds. No tenderness to deep or light palpation. No rebound or guarding.  ***  Neuro: AOx3 *** Ext: no edema Psych: Pleasant and appropriate    ASSESSMENT/PLAN:   No problem-specific Assessment & Plan notes found for this encounter.     Gladys Damme, MD [place]

## 2020-10-24 ENCOUNTER — Ambulatory Visit: Payer: Medicaid Other | Admitting: Family Medicine

## 2020-10-27 ENCOUNTER — Other Ambulatory Visit: Payer: Self-pay

## 2020-10-27 ENCOUNTER — Ambulatory Visit (INDEPENDENT_AMBULATORY_CARE_PROVIDER_SITE_OTHER): Payer: Medicaid Other | Admitting: Family Medicine

## 2020-10-27 VITALS — BP 134/71 | HR 87 | Wt 297.0 lb

## 2020-10-27 DIAGNOSIS — R1031 Right lower quadrant pain: Secondary | ICD-10-CM | POA: Diagnosis not present

## 2020-10-27 DIAGNOSIS — B850 Pediculosis due to Pediculus humanus capitis: Secondary | ICD-10-CM

## 2020-10-27 MED ORDER — PERMETHRIN 1 % EX LIQD: Freq: Once | CUTANEOUS | 3 refills | Status: AC

## 2020-10-27 NOTE — Progress Notes (Signed)
    SUBJECTIVE:   CHIEF COMPLAINT / HPI:   Head lice Patient's 36-year-old daughter had a significant amount of lice and the entire family became infected.  She has done several over-the-counter treatments but does not have anyone to can check her height at home and is wanting to make sure that it is common.  Right sided pelvic pain Patient has right-sided pelvic pain for the last 2 months.  She is currently working on a job and when she is standing a lot and notices that the burning/pain sensation will worsen with long standing and some turning movements.  Also notes some discomfort with intercourse in the same region.  Patient does still have an ovary and fallopian tube present on the right side.  PERTINENT  PMH / PSH: Reviewed  OBJECTIVE:   BP 134/71   Pulse 87   Wt 297 lb (134.7 kg)   LMP 09/29/2019   BMI 45.16 kg/m   General: NAD, well-appearing, well-nourished Respiratory: No respiratory distress, breathing comfortably, able to speak in full sentences Skin: warm and dry, no rashes noted on exposed skin Psych: Appropriate affect and mood Head: nits of lice noted in patient's hair, no identified within the last. Abdomen: At bedtime noted in groin folds, right lower quadrant tender to palpation toward the inguinal area, no mass identified on physical exam though difficult to palpate deeply  ASSESSMENT/PLAN:   Right lower quadrant pain Right lower quadrant pain close to the inguinal area, seems most consistent with a possible hernia.  Cannot confirm via physical exam.  We will get a noncontrast CT scan of the abdomen/pelvis to further evaluate.  Patient does have a history of polycystic ovarian disease and was concerned that this could be contributing to the picture although the pain appears more superficial than the ovaries.  Did consider meralgia paresthetica but will need to rule out other causes first and physical exam was less consistent with the distribution pattern..  Head  lice Nits were present in the hair, no living lice noted but cannot exclude the eggs being present in the nits.  Recommended retreatment with permethrin for the entire family at the same time for both treatment days.  Provided refills on the permethrin and patient able to call in for more as needed.  Patient aware of protocol for discarding clothing items and using extremely hot water to clean after lice exposure.   Mackenzie Gibson, Mackenzie Gibson

## 2020-10-27 NOTE — Patient Instructions (Addendum)
I recommend retreating with the next, I did send in a prescription for it again but it may not be covered it may be cheaper over-the-counter.  It is probably the best treatment that were unable to do.  I recommend treating everybody who is in contact with a child they are all on the same days.  Make sure that anything that came into possible contact with the lice is washed and really hot water to try and get rid of them.  The problem is that he has may be spreading it to each other again if its not treated completely.  If this continues and does not get any better by time of the second treatment just let me know and we will try something else.  For your pelvic pain it could be related to a hernia.  We will get a CT scan without contrast to evaluate and see.  We will call you with the results if they are abnormal.  If everything is normal you may does receive a letter in the mail.

## 2020-10-30 ENCOUNTER — Telehealth: Payer: Self-pay

## 2020-10-30 MED ORDER — SPINOSAD 0.9 % EX SUSP: 1.0000 "application " | Freq: Once | CUTANEOUS | 3 refills | Status: AC

## 2020-10-30 NOTE — Telephone Encounter (Signed)
Contacted the pt to inform her about her CT appt at [facility]. Pt understood. Pt also stated that the Lice shampoo that you prescribed her medicaid do not pay for it and she was wondering if you can prescribe her something different.  Lavell Anchors, CMA

## 2020-11-01 ENCOUNTER — Ambulatory Visit (INDEPENDENT_AMBULATORY_CARE_PROVIDER_SITE_OTHER): Payer: Medicaid Other | Admitting: Family Medicine

## 2020-11-01 ENCOUNTER — Encounter (INDEPENDENT_AMBULATORY_CARE_PROVIDER_SITE_OTHER): Payer: Self-pay | Admitting: Family Medicine

## 2020-11-01 ENCOUNTER — Other Ambulatory Visit: Payer: Self-pay

## 2020-11-01 VITALS — BP 144/83 | HR 87 | Temp 98.3°F | Ht 68.0 in | Wt 295.0 lb

## 2020-11-01 DIAGNOSIS — Z6841 Body Mass Index (BMI) 40.0 and over, adult: Secondary | ICD-10-CM

## 2020-11-01 DIAGNOSIS — E559 Vitamin D deficiency, unspecified: Secondary | ICD-10-CM | POA: Diagnosis not present

## 2020-11-01 DIAGNOSIS — E8881 Metabolic syndrome: Secondary | ICD-10-CM | POA: Diagnosis not present

## 2020-11-01 MED ORDER — METFORMIN HCL 500 MG PO TABS: ORAL_TABLET | ORAL | 0 refills | Status: DC

## 2020-11-01 MED ORDER — VITAMIN D (ERGOCALCIFEROL) 1.25 MG (50000 UNIT) PO CAPS: 50000.0000 [IU] | ORAL_CAPSULE | ORAL | 0 refills | Status: DC

## 2020-11-02 NOTE — Progress Notes (Signed)
Chief Complaint:   Mackenzie Gibson is here to discuss her progress with her obesity treatment plan along with follow-up of her obesity related diagnoses. Mackenzie Gibson is on the Category 4 Plan and keeping a food journal and adhering to recommended goals of [phone] calories and 120 grams protein and states she is following her eating plan approximately 100% of the time. Mackenzie Gibson states she is not currently exercising.  Today's visit was #: 5 Starting weight: 301 lbs Starting date: 08/24/2020 Today's weight: 295 lbs Today's date: 11/01/2020 Total lbs lost to date: 6 Total lbs lost since last in-office visit: 1  Interim History: Mackenzie Gibson is here for a follow up office visit.  We reviewed her meal plan and questions were answered.  Patient's food recall appears to be accurate and consistent with what is on plan when she is following it.   When eating on plan, her hunger and cravings are well controlled.   Pt states she is logging everything in My[employer]itnessPal and is hitting her calorie and protein goals everyday. She did not bring in log.  Assessment/Plan:  No orders of the defined types were placed in this encounter.   Medications Discontinued During This Encounter  Medication Reason   Vitamin D, Ergocalciferol, (DRISDOL) 1.25 MG (50000 UNIT) CAPS capsule Reorder   metFORMIN (GLUCOPHAGE) 500 MG tablet Reorder     Meds ordered this encounter  Medications   metFORMIN (GLUCOPHAGE) 500 MG tablet    Sig: '250mg'$  in am with breakfast and '250mg'$  with dinner    Dispense:  30 tablet    Refill:  0    Ov for rf, 30 d supply   Vitamin D, Ergocalciferol, (DRISDOL) 1.25 MG (50000 UNIT) CAPS capsule    Sig: Take 1 capsule (50,000 Units total) by mouth every 7 (seven) days.    Dispense:  4 capsule    Refill:  0    Ov for RF; 30 d supply     1. Insulin resistance At goal. Goal is HgbA1c < 5.7, fasting insulin closer to 5.  Medication: Metformin.    Plan: Refill and change Metformin to  250 mg BID. She will continue to focus on protein-rich, low simple carbohydrate foods. We reviewed the importance of hydration, regular exercise for stress reduction, and restorative sleep.   Lab Results  Component Value Date   HGBA1C 5.0 08/24/2020   Lab Results  Component Value Date   INSULIN 23.5 08/24/2020   Refill and Change- metFORMIN (GLUCOPHAGE) 500 MG tablet; '250mg'$  in am with breakfast and '250mg'$  with dinner  Dispense: 30 tablet; Refill: 0  2. Vitamin D deficiency Not at goal.   Plan: Continue to take prescription Vitamin D '@50'$ ,000 IU every week as prescribed.  Follow-up for routine testing of Vitamin D, at least 2-3 times per year to avoid over-replacement.  Lab Results  Component Value Date   VD25OH 27.6 (L) 08/24/2020   Refill- Vitamin D, Ergocalciferol, (DRISDOL) 1.25 MG (50000 UNIT) CAPS capsule; Take 1 capsule (50,000 Units total) by mouth every 7 (seven) days.  Dispense: 4 capsule; Refill: 0  3. Obesity with current BMI of 44.9  Mackenzie Gibson is currently in the action stage of change. As such, her goal is to continue with weight loss efforts. She has agreed to the Category 4 Plan and keeping a food journal and adhering to recommended goals of [phone] calories and 120 grams protein.   Pt provided with log to document calorie and protein totals per day. Importance of  accurate journaling was discussed with pt.  Exercise goals: All adults should avoid inactivity. Some physical activity is better than none, and adults who participate in any amount of physical activity gain some health benefits. Start 5 minutes of walking per day.  Behavioral modification strategies: keeping a strict food journal.  Mackenzie Gibson has agreed to follow-up with our clinic in 2-3 weeks. She was informed of the importance of frequent follow-up visits to maximize her success with intensive lifestyle modifications for her multiple health conditions.   Objective:   Blood pressure (!) 144/83, pulse 87,  temperature 98.3 F (36.8 C), height '5\' 8"'$  (1.727 m), weight 295 lb (133.8 kg), last menstrual period 09/29/2019, SpO2 97 %. Body mass index is 44.85 kg/m.  General: Cooperative, alert, well developed, in no acute distress. HEENT: Conjunctivae and lids unremarkable. Cardiovascular: Regular rhythm.  Lungs: Normal work of breathing. Neurologic: No focal deficits.   Lab Results  Component Value Date   CREATININE 0.66 08/24/2020   BUN 11 08/24/2020   NA 140 08/24/2020   K 4.6 08/24/2020   CL 102 08/24/2020   CO2 23 08/24/2020   Lab Results  Component Value Date   ALT 16 08/24/2020   AST 11 08/24/2020   ALKPHOS 75 08/24/2020   BILITOT 0.3 08/24/2020   Lab Results  Component Value Date   HGBA1C 5.0 08/24/2020   HGBA1C 4.8 05/29/2020   HGBA1C 5.1 11/24/2019   HGBA1C 4.9 05/26/2019   HGBA1C 4.9 01/02/2018   Lab Results  Component Value Date   INSULIN 23.5 08/24/2020   Lab Results  Component Value Date   TSH 2.950 08/24/2020   Lab Results  Component Value Date   CHOL 123 08/24/2020   HDL 27 (L) 08/24/2020   LDLCALC 60 08/24/2020   TRIG 221 (H) 08/24/2020   CHOLHDL 4.6 (H) 08/24/2020   Lab Results  Component Value Date   VD25OH 27.6 (L) 08/24/2020   Lab Results  Component Value Date   WBC 11.1 (H) 08/24/2020   HGB 14.6 08/24/2020   HCT 44.5 08/24/2020   MCV 86 08/24/2020   PLT 223 08/24/2020    Attestation Statements:   Reviewed by clinician on day of visit: allergies, medications, problem list, medical history, surgical history, family history, social history, and previous encounter notes.  Coral Ceo, CMA, am acting as transcriptionist for [employer], DO.  I have reviewed the above documentation for accuracy and completeness, and I agree with the above. Marjory Sneddon, D.O.  The [place] was signed into law in 2016 which includes the topic of electronic health records.  This provides immediate access to information in  MyChart.  This includes consultation notes, operative notes, office notes, lab results and pathology reports.  If you have any questions about what you read please let us know at your next visit so we can discuss your concerns and take corrective action if need be.  We are right here with you.

## 2020-11-03 ENCOUNTER — Ambulatory Visit (HOSPITAL_COMMUNITY): Payer: Medicaid Other

## 2020-11-15 DIAGNOSIS — Z9049 Acquired absence of other specified parts of digestive tract: Secondary | ICD-10-CM | POA: Diagnosis not present

## 2020-11-15 DIAGNOSIS — K8050 Calculus of bile duct without cholangitis or cholecystitis without obstruction: Secondary | ICD-10-CM | POA: Diagnosis not present

## 2020-11-15 DIAGNOSIS — K859 Acute pancreatitis without necrosis or infection, unspecified: Secondary | ICD-10-CM | POA: Diagnosis not present

## 2020-11-15 DIAGNOSIS — T383X5A Adverse effect of insulin and oral hypoglycemic [antidiabetic] drugs, initial encounter: Secondary | ICD-10-CM | POA: Diagnosis not present

## 2020-11-15 DIAGNOSIS — F17210 Nicotine dependence, cigarettes, uncomplicated: Secondary | ICD-10-CM | POA: Diagnosis not present

## 2020-11-15 DIAGNOSIS — K760 Fatty (change of) liver, not elsewhere classified: Secondary | ICD-10-CM | POA: Diagnosis not present

## 2020-11-16 ENCOUNTER — Telehealth: Payer: Self-pay

## 2020-11-16 NOTE — Telephone Encounter (Signed)
Transition Care Management Follow-up Telephone Call Date of discharge and from where: 11/15/2020-[facility] How have you been since you were released from the hospital? Patient stated she is doing ok. Any questions or concerns? No  Items Reviewed: Did the pt receive and understand the discharge instructions provided? Yes  Medications obtained and verified? Yes  Other? No  Any new allergies since your discharge? No  Dietary orders reviewed? N/A Do you have support at home? Yes   Home Care and Equipment/Supplies: Were home health services ordered? not applicable If so, what is the name of the agency? N/A  Has the agency set up a time to come to the patient's home? not applicable Were any new equipment or medical supplies ordered?  No What is the name of the medical supply agency? N/A Were you able to get the supplies/equipment? not applicable Do you have any questions related to the use of the equipment or supplies? No  Functional Questionnaire: (I = Independent and D = Dependent) ADLs: I  Bathing/Dressing- I  Meal Prep- I  Eating- I  Maintaining continence- I  Transferring/Ambulation- I  Managing Meds- I  Follow up appointments reviewed:  PCP Hospital f/u appt confirmed? No   Specialist Hospital f/u appt confirmed? Yes  Scheduled to see Weight Management on 11/23/2020 @ 11:40 am. Are transportation arrangements needed? No  If their condition worsens, is the pt aware to call PCP or go to the Emergency Dept.? Yes Was the patient provided with contact information for the PCP's office or ED? Yes Was to pt encouraged to call back with questions or concerns? Yes

## 2020-11-21 ENCOUNTER — Ambulatory Visit (INDEPENDENT_AMBULATORY_CARE_PROVIDER_SITE_OTHER): Payer: Medicaid Other | Admitting: Family Medicine

## 2020-11-22 ENCOUNTER — Other Ambulatory Visit: Payer: Self-pay

## 2020-11-22 ENCOUNTER — Ambulatory Visit (INDEPENDENT_AMBULATORY_CARE_PROVIDER_SITE_OTHER): Payer: Medicaid Other | Admitting: Family Medicine

## 2020-11-22 VITALS — BP 147/69 | HR 89 | Ht 68.0 in | Wt 300.4 lb

## 2020-11-22 DIAGNOSIS — S39011D Strain of muscle, fascia and tendon of abdomen, subsequent encounter: Secondary | ICD-10-CM

## 2020-11-22 DIAGNOSIS — K5909 Other constipation: Secondary | ICD-10-CM

## 2020-11-22 DIAGNOSIS — K5904 Chronic idiopathic constipation: Secondary | ICD-10-CM

## 2020-11-22 DIAGNOSIS — Z23 Encounter for immunization: Secondary | ICD-10-CM | POA: Diagnosis not present

## 2020-11-22 DIAGNOSIS — K5900 Constipation, unspecified: Secondary | ICD-10-CM

## 2020-11-22 DIAGNOSIS — K8500 Idiopathic acute pancreatitis without necrosis or infection: Secondary | ICD-10-CM | POA: Diagnosis not present

## 2020-11-22 MED ORDER — ONDANSETRON 4 MG PO TBDP: 4.0000 mg | ORAL_TABLET | Freq: Three times a day (TID) | ORAL | 0 refills | Status: AC | PRN

## 2020-11-22 MED ORDER — POLYETHYLENE GLYCOL 3350 17 GM/SCOOP PO POWD
17.0000 g | Freq: Every day | ORAL | 1 refills | Status: AC
Start: 2020-11-22 — End: ?

## 2020-11-22 MED ORDER — DICLOFENAC SODIUM 1 % EX GEL: 4.0000 g | Freq: Four times a day (QID) | CUTANEOUS | 1 refills | Status: DC

## 2020-11-22 NOTE — Progress Notes (Signed)
   SUBJECTIVE:   CHIEF COMPLAINT / HPI:   Chief Complaint  Patient presents with   Hosp f/u     Mackenzie Gibson is a 35 y.o. female here for hospital follow-up.  Patient states she was diagnosed with pancreatitis.  Continues to have intermittent vomiting when she eats fatty foods.  She works at [employer] and often eats her meals at work.  She tries to alter her meals but states they are not many options for her.  Continues to have lower right-sided abdominal pain that radiates to her back.  Has a burning sensation when she touches the area.  No dysuria, fever, or diarrhea. Not having regular bowel movements.  Suffers from constipation.  Previously took MiraLAX for this but has not taken any recently.  Last BM with a couple days ago.   PERTINENT  PMH / PSH: reviewed and updated as appropriate   OBJECTIVE:   BP (!) 147/69   Pulse 89   Ht 5\' 8"  (1.727 m)   Wt (!) 300 lb 6.4 oz (136.3 kg)   LMP 09/29/2019   SpO2 100%   BMI 45.68 kg/m    GEN: pleasant well appearing female, in no acute distress  CV: regular rate and rhythm, no murmurs appreciated  RESP: no increased work of breathing, clear to ascultation bilaterally ABD: Bowel sounds present. Soft, obese, mild tenderness to light palpation of the right side of abdomen and right back, no overlying skin changes, no CVA tenderness bilaterally, unable to adequately assess McBurney's point due to abdominal adiposity SKIN: warm, dry, no rash on visible skin    ASSESSMENT/PLAN:   Morbid obesity (HCC) Body mass index is 45.68 kg/m.  Has an appointment with healthy weight and wellness tomorrow.  Reviewed healthy healthy eating habits and need for exercise.  Chronic constipation Reviewed maintenance and cleanout doses of MiraLAX.  Prescription sent to pharmacy.  Constipation may be adding to acute abdominal pain.   Abdominal Pain  Reviewed ED notes from [facility] on 11/15/2020.  Lipase twice the normal limit.  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  History of cholecystectomy.  Recommended clear liquid diet and discussed full and soft diet.  Patient to increase diet as tolerated.  Avoid fatty foods.  Advised to let her body be her guide.  Rx Zofran for nausea/vomiting.  Patient is well-hydrated. She remains afebrile. Doubt acute appendicitis. No CVA tenderness. Not likely kidney stone. Giving hx of burning sensation suspect abdominal muscle strain. Continue Mobic. Recommended heat and Lidocaine patches.  ED and return precautions given.  Patient agrees with plan     [name], DO PGY-3, [facility] 11/22/2020

## 2020-11-22 NOTE — Patient Instructions (Addendum)
Be sure to follow drink plenty of clear liquids. A good rule of thumb is to allow you body to be your guide of what to eat. Stop by the pharmacy to pick up the Zofran.  Trial voltaren (diclofenac) gel for your lower belly pain.    You are constipated and need help to clean out the large amount of stool (poop) in the intestine. This guide tells you what medicine to use.  What do I need to know before starting the clean out?  It will take about 4 to 6 hours to take the medicine.  After taking the medicine, you should have a large stool within 24 hours.  Plan to stay close to a bathroom until the stool has passed. After the intestine is cleaned out, you will need to take a daily medicine.   Remember:  Constipation can last a long time. It may take 6 to 12 months for you to get back to regular bowel movements (BMs). Be patient. Things will get better slowly over time.  If you have questions, call your doctor at this number:     [phone]   What medicine do I need to take?  You need to take Miralax, a powder that you mix in a clear liquid.  Follow these steps: ?    Stir the Miralax powder into water, juice, or Gatorade. Your Miralax dose is: 8 capfuls of Miralax powder in 32  ounces of liquid. Take 1 tablet of senna daily.  ?    Drink 4 to 8 ounces every 30 minutes. It will take 4 to 6 hours to finish the medicine. ?    After the medicine is gone, drink more water or juice. This will help with the cleanout.   -     If the medicine gives you an upset stomach, slow down or stop.   Do I need to keep taking medicine?                                                                                                      After the clean out, you will take a daily (maintenance) medicine for at least 6 months. Your Miralax dose is:    1 capful of powder in 8 ounces of liquid every day   You should go to the doctor for follow-up appointments as directed.  What if I get constipated  again?  Some people need to have the clean out more than one time for the problem to go away. Contact your doctor to ask if you should repeat the clean out. It is OK to do it again, but you should wait at least a week before repeating the clean out.    Will I have any problems with the medicine?   You may have stomach pain or cramping during the clean out. This might mean you have to go to the bathroom.   Take some time to sit on the toilet. The pain will go away when the stool is gone. You may want to read while you  wait. A warm bath may also help.   What should I eat and drink?  Drink lots of water and juice. Fruits and vegetables are good foods to eat. Try to avoid greasy and fatty foods. Goal is to get 25-35 grams of fiber daily.

## 2020-11-23 ENCOUNTER — Encounter (INDEPENDENT_AMBULATORY_CARE_PROVIDER_SITE_OTHER): Payer: Self-pay | Admitting: Family Medicine

## 2020-11-23 ENCOUNTER — Ambulatory Visit (INDEPENDENT_AMBULATORY_CARE_PROVIDER_SITE_OTHER): Payer: Medicaid Other | Admitting: Family Medicine

## 2020-11-23 VITALS — BP 136/86 | HR 65 | Temp 98.1°F | Ht 68.0 in | Wt 294.0 lb

## 2020-11-23 DIAGNOSIS — E8881 Metabolic syndrome: Secondary | ICD-10-CM | POA: Diagnosis not present

## 2020-11-23 DIAGNOSIS — Z6841 Body Mass Index (BMI) 40.0 and over, adult: Secondary | ICD-10-CM | POA: Diagnosis not present

## 2020-11-23 DIAGNOSIS — E559 Vitamin D deficiency, unspecified: Secondary | ICD-10-CM | POA: Diagnosis not present

## 2020-11-23 MED ORDER — METFORMIN HCL 500 MG PO TABS: ORAL_TABLET | ORAL | 0 refills | Status: DC

## 2020-11-23 MED ORDER — VITAMIN D (ERGOCALCIFEROL) 1.25 MG (50000 UNIT) PO CAPS: 50000.0000 [IU] | ORAL_CAPSULE | ORAL | 0 refills | Status: DC

## 2020-11-23 NOTE — Progress Notes (Signed)
Chief Complaint:   Mackenzie Gibson is here to discuss her progress with her obesity treatment plan along with follow-up of her obesity related diagnoses. Mackenzie Gibson is on the Category 4 Plan or keeping a food journal and adhering to recommended goals of 2000-2200 calories and 120 grams of protein daily and states she is following her eating plan approximately 60% of the time. [name] states she is walking 10,000 steps daily.   Today's visit was #: 6 Starting weight: 301 lbs Starting date: 08/24/2020 Today's weight: 294 lbs Today's date: 11/23/2020 Total lbs lost to date: 7 Total lbs lost since last in-office visit: 1  Interim History: Mackenzie Gibson was seen in the hospital for pancreatitis on 11/15/2020. Her lipase was 106 ( no amylase checked) and serum creatinine was 0.89.    her abdominal ultrasound showed hepatic stenosis only.   Her primary care physician is treating her nausea with Zofran, and will be obtaining a ABD CT scan in the near future. She is on miralax for some constipation as well.   She did not bring in her journal or phone where she logged her foods:  endorses she lost her phone in the deep fryer, and her kid tore up her journaling log papers.  Subjective:   1. Insulin resistance [name] has a diagnosis of insulin resistance based on her elevated fasting insulin level >5. She continues to work on diet and exercise to decrease her risk of diabetes.  2. Vitamin D deficiency [name] is currently taking prescription vitamin D 50,000 IU each week. She denies nausea, vomiting or muscle weakness.  Assessment/Plan:    Medications Discontinued During This Encounter  Medication Reason   metFORMIN (GLUCOPHAGE) 500 MG tablet Reorder   Vitamin D, Ergocalciferol, (DRISDOL) 1.25 MG (50000 UNIT) CAPS capsule Reorder     Meds ordered this encounter  Medications   metFORMIN (GLUCOPHAGE) 500 MG tablet    Sig: 250mg  po with lunch daily    Dispense:  15 tablet    Refill:  0     Ov for rf, 30 d supply   Vitamin D, Ergocalciferol, (DRISDOL) 1.25 MG (50000 UNIT) CAPS capsule    Sig: Take 1 capsule (50,000 Units total) by mouth every 7 (seven) days.    Dispense:  4 capsule    Refill:  0    Ov for RF; 30 d supply     1. Insulin resistance [name] agreed to decrease metformin to 250 mg q daily with lunch, and we will refill for 1 month. She will follow her meal plan and journal her intake. [name] agreed to follow-up with [name] as directed to closely monitor her progress.  - metFORMIN (GLUCOPHAGE) 500 MG tablet; 250 mg po with lunch daily  Dispense: 15 tablet; Refill: 0  2. Vitamin D deficiency Low Vitamin D level contributes to fatigue and are associated with obesity, breast, and colon cancer. We will refill prescription Vitamin D 50,000 IU every week for 1 month. [name] will follow-up for routine testing of Vitamin D, at least 2-3 times per year to avoid over-replacement.  - Vitamin D, Ergocalciferol, (DRISDOL) 1.25 MG (50000 UNIT) CAPS capsule; Take 1 capsule (50,000 Units total) by mouth every 7 (seven) days.  Dispense: 4 capsule; Refill: 0  3. Obesity with current BMI of 44.8 [name] is currently in the action stage of change. As such, her goal is to continue with weight loss efforts. She has agreed to the Category 4 Plan or keeping a food journal and adhering to  recommended goals of 2000-2200 calories and 120 grams of protein daily.   Mackenzie Gibson is to bring in her journaling log for her next office visit.  -  I highly recommend she eat healthy and decrease fatty meats/meals as per the plan.  Exercise goals: As is.  Behavioral modification strategies: decreasing simple carbohydrates, decreasing liquid calories, and keeping a strict food journal.  Mackenzie Gibson has agreed to follow-up with our clinic in 2 weeks. She was informed of the importance of frequent follow-up visits to maximize her success with intensive lifestyle modifications for her multiple health  conditions.   Objective:   Blood pressure 136/86, pulse 65, temperature 98.1 F (36.7 C), height 5\' 8"  (1.727 m), weight 294 lb (133.4 kg), last menstrual period 09/29/2019, SpO2 100 %. Body mass index is 44.7 kg/m.  General: Cooperative, alert, well developed, in no acute distress. HEENT: Conjunctivae and lids unremarkable. Cardiovascular: Regular rhythm.  Lungs: Normal work of breathing. Neurologic: No focal deficits.   Lab Results  Component Value Date   CREATININE 0.66 08/24/2020   BUN 11 08/24/2020   NA 140 08/24/2020   K 4.6 08/24/2020   CL 102 08/24/2020   CO2 23 08/24/2020   Lab Results  Component Value Date   ALT 16 08/24/2020   AST 11 08/24/2020   ALKPHOS 75 08/24/2020   BILITOT 0.3 08/24/2020   Lab Results  Component Value Date   HGBA1C 5.0 08/24/2020   HGBA1C 4.8 05/29/2020   HGBA1C 5.1 11/24/2019   HGBA1C 4.9 05/26/2019   HGBA1C 4.9 01/02/2018   Lab Results  Component Value Date   INSULIN 23.5 08/24/2020   Lab Results  Component Value Date   TSH 2.950 08/24/2020   Lab Results  Component Value Date   CHOL 123 08/24/2020   HDL 27 (L) 08/24/2020   LDLCALC 60 08/24/2020   TRIG 221 (H) 08/24/2020   CHOLHDL 4.6 (H) 08/24/2020   Lab Results  Component Value Date   VD25OH 27.6 (L) 08/24/2020   Lab Results  Component Value Date   WBC 11.1 (H) 08/24/2020   HGB 14.6 08/24/2020   HCT 44.5 08/24/2020   MCV 86 08/24/2020   PLT 223 08/24/2020   No results found for: IRON, TIBC, FERRITIN  Attestation Statements:   Reviewed by clinician on day of visit: allergies, medications, problem list, medical history, surgical history, family history, social history, and previous encounter notes.   Wilhemena Durie, am acting as transcriptionist for [employer], DO.  I have reviewed the above documentation for accuracy and completeness, and I agree with the above. Marjory Sneddon, D.O.  The [place] was signed into law in 2016  which includes the topic of electronic health records.  This provides immediate access to information in MyChart.  This includes consultation notes, operative notes, office notes, lab results and pathology reports.  If you have any questions about what you read please let us know at your next visit so we can discuss your concerns and take corrective action if need be.  We are right here with you.

## 2020-11-26 ENCOUNTER — Encounter: Payer: Self-pay | Admitting: Family Medicine

## 2020-11-26 DIAGNOSIS — K5909 Other constipation: Secondary | ICD-10-CM | POA: Insufficient documentation

## 2020-11-26 NOTE — Assessment & Plan Note (Signed)
Reviewed maintenance and cleanout doses of MiraLAX.  Prescription sent to pharmacy.  Constipation may be adding to acute abdominal pain.

## 2020-11-26 NOTE — Assessment & Plan Note (Signed)
Body mass index is 45.68 kg/m.  Has an appointment with healthy weight and wellness tomorrow.  Reviewed healthy healthy eating habits and need for exercise.

## 2020-12-06 ENCOUNTER — Encounter (INDEPENDENT_AMBULATORY_CARE_PROVIDER_SITE_OTHER): Payer: Self-pay

## 2020-12-07 ENCOUNTER — Ambulatory Visit (INDEPENDENT_AMBULATORY_CARE_PROVIDER_SITE_OTHER): Payer: Medicaid Other | Admitting: Family Medicine

## 2020-12-23 DIAGNOSIS — J40 Bronchitis, not specified as acute or chronic: Secondary | ICD-10-CM | POA: Diagnosis not present

## 2020-12-23 DIAGNOSIS — F17200 Nicotine dependence, unspecified, uncomplicated: Secondary | ICD-10-CM | POA: Diagnosis not present

## 2020-12-28 ENCOUNTER — Ambulatory Visit (INDEPENDENT_AMBULATORY_CARE_PROVIDER_SITE_OTHER): Payer: Medicaid Other | Admitting: Family Medicine

## 2020-12-28 ENCOUNTER — Encounter (INDEPENDENT_AMBULATORY_CARE_PROVIDER_SITE_OTHER): Payer: Self-pay | Admitting: Family Medicine

## 2020-12-28 ENCOUNTER — Other Ambulatory Visit: Payer: Self-pay

## 2020-12-28 VITALS — BP 125/79 | HR 80 | Temp 98.1°F | Ht 68.0 in | Wt 291.0 lb

## 2020-12-28 DIAGNOSIS — E559 Vitamin D deficiency, unspecified: Secondary | ICD-10-CM | POA: Diagnosis not present

## 2020-12-28 DIAGNOSIS — E669 Obesity, unspecified: Secondary | ICD-10-CM | POA: Diagnosis not present

## 2020-12-28 DIAGNOSIS — Z8719 Personal history of other diseases of the digestive system: Secondary | ICD-10-CM

## 2020-12-28 DIAGNOSIS — Z6841 Body Mass Index (BMI) 40.0 and over, adult: Secondary | ICD-10-CM | POA: Diagnosis not present

## 2020-12-28 MED ORDER — VITAMIN D (ERGOCALCIFEROL) 1.25 MG (50000 UNIT) PO CAPS: 50000.0000 [IU] | ORAL_CAPSULE | ORAL | 0 refills | Status: DC

## 2020-12-28 NOTE — Progress Notes (Signed)
Chief Complaint:   [name] is here to discuss her progress with her obesity treatment plan along with follow-up of her obesity related diagnoses. Mackenzie Gibson is on the Category 4 Plan and states she is following her eating plan approximately 100% of the time. Mackenzie Gibson states she is walking 11000 steps 5 times per week.  Today's visit was #: 7 Starting weight: 301 lbs Starting date: 08/24/2020 Today's weight: 291 lbs Today's date: 12/28/2020 Total lbs lost to date: 10 Total lbs lost since last in-office visit: 3  Interim History: Mackenzie Gibson quit her job yesterday and is starting at [employer] next week. She has some familial stress with her son fracturing his tibia, daughter having a UTI, and a 15 year old at home. She thinks the change of job will lead to greater ease of following plan.  Subjective:   1. Vitamin D deficiency Pt denies nausea, vomiting, and muscle weakness but notes fatigue. Her last Vit D level was 27.6.  2. H/O acute pancreatitis Recent diagnosis on 11/15/2020. Emergency Department told pt this was likely due to Metformin.  Assessment/Plan:   1. Vitamin D deficiency Low Vitamin D level contributes to fatigue and are associated with obesity, breast, and colon cancer. She agrees to continue to take prescription Vitamin D 50,000 IU every week and will follow-up for routine testing of Vitamin D, at least 2-3 times per year to avoid over-replacement.  Refill- Vitamin D, Ergocalciferol, (DRISDOL) 1.25 MG (50000 UNIT) CAPS capsule; Take 1 capsule (50,000 Units total) by mouth every 7 (seven) days.  Dispense: 4 capsule; Refill: 0  2. H/O acute pancreatitis Pt encouraged to restart Metformin.  3. Obesity with BMI of 44.3  Mackenzie Gibson is currently in the action stage of change. As such, her goal is to continue with weight loss efforts. She has agreed to the Category 4 Plan.   Exercise goals:  Use resistance bands 10-15 minutes 3 times a week.  Behavioral  modification strategies: increasing lean protein intake, meal planning and cooking strategies, keeping healthy foods in the home, and planning for success.  Mackenzie Gibson has agreed to follow-up with our clinic in 2-3 weeks. She was informed of the importance of frequent follow-up visits to maximize her success with intensive lifestyle modifications for her multiple health conditions.   Objective:   Blood pressure 125/79, pulse 80, temperature 98.1 F (36.7 C), height 5\' 8"  (1.727 m), weight 291 lb (132 kg), last menstrual period 09/29/2019, SpO2 99 %. Body mass index is 44.25 kg/m.  General: Cooperative, alert, well developed, in no acute distress. HEENT: Conjunctivae and lids unremarkable. Cardiovascular: Regular rhythm.  Lungs: Normal work of breathing. Neurologic: No focal deficits.   Lab Results  Component Value Date   CREATININE 0.66 08/24/2020   BUN 11 08/24/2020   NA 140 08/24/2020   K 4.6 08/24/2020   CL 102 08/24/2020   CO2 23 08/24/2020   Lab Results  Component Value Date   ALT 16 08/24/2020   AST 11 08/24/2020   ALKPHOS 75 08/24/2020   BILITOT 0.3 08/24/2020   Lab Results  Component Value Date   HGBA1C 5.0 08/24/2020   HGBA1C 4.8 05/29/2020   HGBA1C 5.1 11/24/2019   HGBA1C 4.9 05/26/2019   HGBA1C 4.9 01/02/2018   Lab Results  Component Value Date   INSULIN 23.5 08/24/2020   Lab Results  Component Value Date   TSH 2.950 08/24/2020   Lab Results  Component Value Date   CHOL 123 08/24/2020   HDL 27 (  L) 08/24/2020   LDLCALC 60 08/24/2020   TRIG 221 (H) 08/24/2020   CHOLHDL 4.6 (H) 08/24/2020   Lab Results  Component Value Date   VD25OH 27.6 (L) 08/24/2020   Lab Results  Component Value Date   WBC 11.1 (H) 08/24/2020   HGB 14.6 08/24/2020   HCT 44.5 08/24/2020   MCV 86 08/24/2020   PLT 223 08/24/2020    Attestation Statements:   Reviewed by clinician on day of visit: allergies, medications, problem list, medical history, surgical history,  family history, social history, and previous encounter notes.  Time spent on visit including pre-visit chart review and post-visit care and charting was 25 minutes.   Coral Ceo, CMA, am acting as transcriptionist for Coralie Common, MD.  I have reviewed the above documentation for accuracy and completeness, and I agree with the above. - Coralie Common, MD

## 2021-01-10 ENCOUNTER — Ambulatory Visit (INDEPENDENT_AMBULATORY_CARE_PROVIDER_SITE_OTHER): Payer: Medicaid Other | Admitting: Family Medicine

## 2021-01-10 ENCOUNTER — Encounter (INDEPENDENT_AMBULATORY_CARE_PROVIDER_SITE_OTHER): Payer: Self-pay

## 2021-02-01 ENCOUNTER — Ambulatory Visit (HOSPITAL_COMMUNITY)
Admission: RE | Admit: 2021-02-01 | Discharge: 2021-02-01 | Disposition: A | Discharge status: Home / Self Care | Payer: Medicaid Other | Source: Ambulatory Visit | Attending: Family Medicine | Admitting: Family Medicine

## 2021-02-01 ENCOUNTER — Ambulatory Visit (INDEPENDENT_AMBULATORY_CARE_PROVIDER_SITE_OTHER): Payer: Medicaid Other | Admitting: Family Medicine

## 2021-02-01 ENCOUNTER — Encounter: Payer: Self-pay | Admitting: Family Medicine

## 2021-02-01 ENCOUNTER — Other Ambulatory Visit: Payer: Self-pay

## 2021-02-01 DIAGNOSIS — Z791 Long term (current) use of non-steroidal anti-inflammatories (NSAID): Secondary | ICD-10-CM | POA: Insufficient documentation

## 2021-02-01 DIAGNOSIS — M545 Low back pain, unspecified: Secondary | ICD-10-CM | POA: Diagnosis not present

## 2021-02-01 MED ORDER — CYCLOBENZAPRINE HCL 10 MG PO TABS: 10.0000 mg | ORAL_TABLET | Freq: Three times a day (TID) | ORAL | 0 refills | Status: DC | PRN

## 2021-02-01 NOTE — Patient Instructions (Signed)
Xrays are ordered at [facility]. Someone should call from the [facility]. I sent in a prescription for a muscle relaxer to take at night. I will call with the kidney blood test results and x ray results.  I recommend topical treatment for the back. The voltaren gel you have is safe. Two other things to try, both over the counter. Lidocaine (numbing medicine) cream for the back. Capsaicin is an excellent heat rub for the back.  Figure out which of these topicals works best for you. Back problems are tough.  I hope something I have recommended helps.

## 2021-02-01 NOTE — Assessment & Plan Note (Signed)
Will check BMP

## 2021-02-01 NOTE — Progress Notes (Signed)
    SUBJECTIVE:   CHIEF COMPLAINT / HPI:   Patient fell down four steps one month ago striking elbow, shoulder and low back.  Shoulder and elbow pain have resolved.  Low back pain lingers.  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  She has taken "so much ibuprofen, I am worried about my kidneys.  Not previously evaluated for this fall.  Does complain that pain rarely shoots down both legs Rt>Lt.  No change in bowel or bladder.  No fever.  Not immunocompromised.  No hx of malignancy.  Did have previous low back pain, which was mild and "not nearly this bad.    OBJECTIVE:   BP (!) 134/97   Pulse 86   Ht 5\' 8"  (1.727 m)   Wt 293 lb 6 oz (133.1 kg)   LMP 09/29/2019   SpO2 100%   BMI 44.61 kg/m   Lungs clear Cardiac RRR without m or g Back.  Indicates pain begins at midline upper lumbar and radiates down both ileal crests.  Bilateral paraspinous muscle tenderness. Normal leg strength, sensation and reflexes.  Normal gait.    ASSESSMENT/PLAN:   NSAID long-term use Will check BMP  Low back pain Will check plain films.  I would not be surprised by underlying facet arthritis or DDD.  Doubt fracture.   Physical therapy. Topicals Muscle relaxer at night.     Zenia Resides, MD [place]

## 2021-02-01 NOTE — Assessment & Plan Note (Signed)
Will check plain films.  I would not be surprised by underlying facet arthritis or DDD.  Doubt fracture.   Physical therapy. Topicals Muscle relaxer at night.

## 2021-02-02 LAB — BASIC METABOLIC PANEL
BUN/Creatinine Ratio: 13 (ref 9–23)
BUN: 11 mg/dL (ref 6–20)
CO2: 23 mmol/L (ref 20–29)
Calcium: 9.5 mg/dL (ref 8.7–10.2)
Chloride: 106 mmol/L (ref 96–106)
Creatinine, Ser: 0.83 mg/dL (ref 0.57–1.00)
Glucose: 91 mg/dL (ref 70–99)
Potassium: 5.1 mmol/L (ref 3.5–5.2)
Sodium: 141 mmol/L (ref 134–144)
eGFR: 94 mL/min/{1.73_m2} (ref >59)

## 2021-02-05 ENCOUNTER — Telehealth: Payer: Self-pay

## 2021-02-05 NOTE — Telephone Encounter (Signed)
Called, given results and answered questions.  She will follow up in 2-4 weeks with Dr. Susa Simmonds.

## 2021-02-05 NOTE — Telephone Encounter (Signed)
Attempted to call.  Mailbox full.  Will try again later.

## 2021-02-05 NOTE — Telephone Encounter (Signed)
Patient calls nurse line regarding results of lumbar spine X-ray.   Please advise.   Talbot Grumbling, RN

## 2021-02-06 ENCOUNTER — Ambulatory Visit: Payer: Medicaid Other | Attending: Family Medicine

## 2021-02-15 ENCOUNTER — Other Ambulatory Visit: Payer: Self-pay

## 2021-02-15 ENCOUNTER — Ambulatory Visit (INDEPENDENT_AMBULATORY_CARE_PROVIDER_SITE_OTHER): Payer: Medicaid Other | Admitting: Family Medicine

## 2021-02-15 ENCOUNTER — Encounter: Payer: Self-pay | Admitting: Family Medicine

## 2021-02-15 DIAGNOSIS — M545 Low back pain, unspecified: Secondary | ICD-10-CM | POA: Diagnosis not present

## 2021-02-15 DIAGNOSIS — E559 Vitamin D deficiency, unspecified: Secondary | ICD-10-CM

## 2021-02-15 DIAGNOSIS — E8881 Metabolic syndrome: Secondary | ICD-10-CM

## 2021-02-15 MED ORDER — IBUPROFEN 800 MG PO TABS: 800.0000 mg | ORAL_TABLET | Freq: Three times a day (TID) | ORAL | 0 refills | Status: DC | PRN

## 2021-02-15 NOTE — Progress Notes (Signed)
° °  SUBJECTIVE:   CHIEF COMPLAINT / HPI:   Mackenzie Gibson is a 35 y.o. female here for imaging results and discuss weight.     She continues to have back pain since her fall. She is not taking muscle relaxer as she does not like they way it makes her feel.   She has been going to her healhty weight and wellness appointment. She reports she follows the diet outlined but has only lost 10 lbs in the last 9 months.  She previously treied [employer], slim fast diet without much success. She works out at [employer] and at home but her back pain limits her exercise ability. Has arthritis in her knees.   PERTINENT  PMH / PSH: reviewed and updated as appropriate   OBJECTIVE:   BP 133/84    Pulse 81    Ht 5\' 8"  (1.727 m)    Wt 295 lb 4 oz (133.9 kg)    LMP 09/29/2019    SpO2 100%    BMI 44.89 kg/m    GEN: well appearing female in no acute distress  CVS: well perfused  RESP: speaking in full sentences without pause, no respiratory distress  MSK lumbar spine:  right > left paraspinal tenderness, mild midline spinous process tenderness, mild pain with flexion, good extension   ASSESSMENT/PLAN:   Morbid obesity (HCC) Body mass index is 44.89 kg/m. She has had sub-optimal success with several diets and changes to her lifestyle. She has chronic bilateral knee pain and low back pain,  PCOS and insulin resistance.  Discussed bariatric surgery and patient would like to discuss with surgeon.  Referral for bariatric surgery placed.   Low back pain Revierwed lumbar xray with showed facet degenerative changes are greatest at L4-5, slightly worse on the right without fracture. Advised her to try at least one session of physical therapy to learn her home exercises and she is agreeable. She reports missing her initial PT appointment and was told she needed a new referral.  Referral placed for PT.  Refilled ibuprofen for PRN. Take 1000 mg Tylenol BID for pain.     [name], DO PGY-3, [facility] 02/16/2021

## 2021-02-15 NOTE — Patient Instructions (Addendum)
For your back pain, Take 1500 mg Tylenol twice a day, take Iburofen as needed for pain. Be sure to stop by the pharmacy to pick up Lidocaine patches. Apply for 12 hours and then remove.  Watch for worsening symptoms such as an increasing weakness or loss of sensation legs, increasing pain and the loss of bladder or bowel function. Should any of these occur, go to the emergency department immediately.        IF YOU HAVE BEEN REFERRED TO A SPECIALIST, IT MAY TAKE 1-2 WEEKS TO SCHEDULE/PROCESS THE REFERRAL. IF YOU HAVE NOT HEARD FROM US/SPECIALIST IN TWO WEEKS, PLEASE GIVE [name] A CALL AT [phone].

## 2021-02-16 LAB — VITAMIN D 25 HYDROXY (VIT D DEFICIENCY, FRACTURES): Vit D, 25-Hydroxy: 27.3 ng/mL — ABNORMAL LOW (ref 30.0–100.0)

## 2021-02-16 LAB — HEMOGLOBIN A1C
Est. average glucose Bld gHb Est-mCnc: 100 mg/dL
Hgb A1c MFr Bld: 5.1 % (ref 4.8–5.6)

## 2021-02-16 MED ORDER — VITAMIN D (ERGOCALCIFEROL) 1.25 MG (50000 UNIT) PO CAPS: 50000.0000 [IU] | ORAL_CAPSULE | ORAL | 0 refills | Status: DC

## 2021-02-16 NOTE — Assessment & Plan Note (Signed)
Revierwed lumbar xray with showed facet degenerative changes are greatest at L4-5, slightly worse on the right without fracture. Advised her to try at least one session of physical therapy to learn her home exercises and she is agreeable. She reports missing her initial PT appointment and was told she needed a new referral.  Referral placed for PT.  Refilled ibuprofen for PRN. Take 1000 mg Tylenol BID for pain.

## 2021-02-16 NOTE — Assessment & Plan Note (Signed)
Completed vit d therapy. Recheck today.

## 2021-02-16 NOTE — Assessment & Plan Note (Addendum)
Body mass index is 44.89 kg/m. She has had sub-optimal success with several diets and changes to her lifestyle. She has chronic bilateral knee pain and low back pain,  PCOS and insulin resistance.  Discussed bariatric surgery and patient would like to discuss with surgeon.  Referral for bariatric surgery placed.

## 2021-03-08 ENCOUNTER — Ambulatory Visit: Payer: Medicaid Other | Admitting: Family Medicine

## 2021-03-13 ENCOUNTER — Ambulatory Visit: Payer: Medicaid Other | Attending: Family Medicine

## 2021-03-13 NOTE — Therapy (Incomplete)
OUTPATIENT PHYSICAL THERAPY THORACOLUMBAR EVALUATION   Patient Name: Mackenzie Gibson MRN: [id number] DOB:[birth year], 36 y.o., female Today's Date: 03/13/2021    Past Medical History:  Diagnosis Date   Anxiety    Arthritis    Back pain    Bipolar disorder ([place])    Bipolar disorder ([place])    Chronic hypertension in pregnancy 03/02/2015   Constipation    Depression    Family history of adverse reaction to anesthesia    mom "woke up during" surgery    Gallbladder problem    High blood pressure    HPV (human papilloma virus) infection    Joint pain    Morbid obesity (HCC)    PCOS (polycystic ovarian syndrome)    PONV (postoperative nausea and vomiting)    Pregnancy induced hypertension    SOBOE (shortness of breath on exertion)    Swallowing difficulty    Past Surgical History:  Procedure Laterality Date   CHOLECYSTECTOMY     EYE SURGERY     TUBAL LIGATION Bilateral 03/03/2015   Procedure: POST PARTUM TUBAL LIGATION;  Surgeon: Guss Bunde, MD;  Location: [facility];  Service: Gynecology;  Laterality: Bilateral;   VAGINAL HYSTERECTOMY Bilateral 10/26/2019   Procedure: HYSTERECTOMY VAGINAL;  Surgeon: Chancy Milroy, MD;  Location: [facility];  Service: Gynecology;  Laterality: Bilateral;   Patient Active Problem List   Diagnosis Date Noted   NSAID long-term use 02/01/2021   Low back pain 02/01/2021   Chronic constipation 11/26/2020   Vitamin D deficiency 09/28/2020   Insulin resistance 09/28/2020   Class 3 severe obesity with serious comorbidity and body mass index (BMI) of 45.0 to 49.9 in adult ([place]) 09/21/2020   h/o Secondary hypertension ( w/ pregnancy) 08/24/2020   Other fatigue 08/24/2020   SOBOE (shortness of breath on exertion) 08/24/2020   PCOS (polycystic ovarian syndrome) 08/24/2020   Tobacco abuse 08/24/2020   Right knee pain 05/10/2020   Leukocytosis 02/01/2020   Hand edema 02/01/2020   Post-operative state 10/26/2019   Anxiety 02/13/2019    Hidradenitis suppurativa 04/23/2018   Migraine 12/05/2017   S/P tubal ligation 03/03/2015   Asthmatic bronchitis , chronic ([place]) 02/09/2015   Obesity, morbid ([place])    Bipolar disorder, mixed ([place]) 01/12/2014   Chronic pelvic pain in female 01/12/2014   Eczema, dyshidrotic 12/20/2013   Tobacco use disorder 03/06/2009   Morbid obesity ([place]) 10/20/2006   PANIC DISORDER 10/20/2006   POLYCYSTIC OVARIAN DISEASE 03/04/2004    PCP: [name], DO  REFERRING PROVIDER: Dickie La, MD  REFERRING DIAG: Bilateral low back pain, unspecified chronicity, unspecified whether sciatica present  THERAPY DIAG:  No diagnosis found.  ONSET DATE: ***  SUBJECTIVE:  SUBJECTIVE STATEMENT: *** PERTINENT HISTORY:  ***  PAIN:  Are you having pain? {yes/no:20286} VAS scale: ***/10 Pain location: *** Pain orientation: {Pain Orientation:25161}  PAIN TYPE: {type:313116} Pain description: {PAIN DESCRIPTION:21022940}  Aggravating factors: *** Relieving factors: ***  PRECAUTIONS: {Therapy precautions:24002}  WEIGHT BEARING RESTRICTIONS {Yes ***/No:24003}  FALLS:  Has patient fallen in last 6 months? {yes/no:20286}, Number of falls: ***  LIVING ENVIRONMENT: Lives with: {[facility] lives with:25569::"lives with their family"} Lives in: {Lives in:25570} Stairs: {yes/no:20286}; {Stairs:24000} Has following equipment at home: {Assistive devices:23999}  OCCUPATION: ***  PLOF: {PLOF:24004}  PATIENT GOALS ***   OBJECTIVE:   DIAGNOSTIC FINDINGS:  Xray 02/01/21-FINDINGS: 5 non rib-bearing lumbar type vertebral bodies are present. Facet degenerative changes are greatest at L4-5, slightly worse on the right. No acute or healing fractures are present. No significant listhesis is present. Lumbar lordosis is  preserved. Soft tissues are unremarkable.   IMPRESSION: 1. Facet degenerative changes are greatest at L4-5, slightly worse on the right. 2. No acute or healing fracture.   PATIENT SURVEYS:  {rehab surveys:24030}  SCREENING FOR RED FLAGS: Bowel or bladder incontinence: {Yes/No:304960894} Spinal tumors: {Yes/No:304960894} Cauda equina syndrome: {Yes/No:304960894} Compression fracture: {Yes/No:304960894} Abdominal aneurysm: {Yes/No:304960894}  COGNITION:  Overall cognitive status: {cognition:24006}     SENSATION:  Light touch: {intact/deficits:24005}  Stereognosis: {intact/deficits:24005}  Hot/Cold: {intact/deficits:24005}  Proprioception: {intact/deficits:24005}  MUSCLE LENGTH: Hamstrings: Right *** deg; Left *** deg Thomas test: Right *** deg; Left *** deg  POSTURE:  ***  PALPATION: ***  LUMBARAROM/PROM  A/PROM A/PROM  03/13/2021  Flexion   Extension   Right lateral flexion   Left lateral flexion   Right rotation   Left rotation    (Blank rows = not tested)  LE AROM/PROM:  A/PROM Right 03/13/2021 Left 03/13/2021  Hip flexion    Hip extension    Hip abduction    Hip adduction    Hip internal rotation    Hip external rotation    Knee flexion    Knee extension    Ankle dorsiflexion    Ankle plantarflexion    Ankle inversion    Ankle eversion     (Blank rows = not tested)  LE MMT:  MMT Right 03/13/2021 Left 03/13/2021  Hip flexion    Hip extension    Hip abduction    Hip adduction    Hip internal rotation    Hip external rotation    Knee flexion    Knee extension    Ankle dorsiflexion    Ankle plantarflexion    Ankle inversion    Ankle eversion     (Blank rows = not tested)  LUMBAR SPECIAL TESTS:  {lumbar special test:25242}  FUNCTIONAL TESTS:  {Functional tests:24029}  GAIT: Distance walked: *** Assistive device utilized: {Assistive devices:23999} Level of assistance: {Levels of assistance:24026} Comments: ***    TODAY'S  TREATMENT  ***   PATIENT EDUCATION:  Education details: *** Person educated: {Person educated:25204} Education method: {Education Method:25205} Education comprehension: {Education Comprehension:25206}   HOME EXERCISE PROGRAM: ***  ASSESSMENT:  CLINICAL IMPRESSION: Patient is a *** y.o. *** who was seen today for physical therapy evaluation and treatment for ***. Objective impairments include {opptimpairments:25111}. These impairments are limiting patient from {activity limitations:25113}. Personal factors including {Personal factors:25162} are also affecting patient's functional outcome. Patient will benefit from skilled PT to address above impairments and improve overall function.  REHAB POTENTIAL: {rehabpotential:25112}  CLINICAL DECISION MAKING: {clinical decision making:25114}  EVALUATION COMPLEXITY: {Evaluation complexity:25115}   GOALS: Goals reviewed with patient? {yes/no:20286}  SHORT  TERM GOALS:  STG Name Target Date Goal status  1 *** Baseline:  {follow up:25551} {GOALSTATUS:25110}  2 *** Baseline:  {follow up:25551} {GOALSTATUS:25110}  3 *** Baseline: {follow up:25551} {GOALSTATUS:25110}  4 *** Baseline: {follow up:25551} {GOALSTATUS:25110}  5 *** Baseline: {follow up:25551} {GOALSTATUS:25110}  6 *** Baseline: {follow up:25551} {GOALSTATUS:25110}  7 *** Baseline: {follow up:25551} {GOALSTATUS:25110}   LONG TERM GOALS:   LTG Name Target Date Goal status  1 *** Baseline: {follow up:25551} {GOALSTATUS:25110}  2 *** Baseline: {follow up:25551} {GOALSTATUS:25110}  3 *** Baseline: {follow up:25551} {GOALSTATUS:25110}  4 *** Baseline: {follow up:25551} {GOALSTATUS:25110}  5 *** Baseline: {follow up:25551} {GOALSTATUS:25110}  6 *** Baseline: {follow up:25551} {GOALSTATUS:25110}  7 *** Baseline: {follow up:25551} {GOALSTATUS:25110}   PLAN: PT FREQUENCY: {rehab frequency:25116}  PT DURATION: {rehab duration:25117}  PLANNED INTERVENTIONS: {rehab  planned interventions:25118::"Therapeutic exercises","Therapeutic activity","Neuro Muscular re-education","Balance training","Gait training","Patient/Family education","Joint mobilization"}  PLAN FOR NEXT SESSION: ***   Elizaveta Mattice 03/13/2021, 5:58 AM

## 2021-03-16 DIAGNOSIS — J029 Acute pharyngitis, unspecified: Secondary | ICD-10-CM | POA: Diagnosis not present

## 2021-03-16 DIAGNOSIS — J329 Chronic sinusitis, unspecified: Secondary | ICD-10-CM | POA: Diagnosis not present

## 2021-03-16 DIAGNOSIS — R051 Acute cough: Secondary | ICD-10-CM | POA: Diagnosis not present

## 2021-04-12 ENCOUNTER — Encounter: Payer: Self-pay | Admitting: Family Medicine

## 2021-04-24 DIAGNOSIS — J40 Bronchitis, not specified as acute or chronic: Secondary | ICD-10-CM | POA: Diagnosis not present

## 2021-06-18 ENCOUNTER — Ambulatory Visit (INDEPENDENT_AMBULATORY_CARE_PROVIDER_SITE_OTHER): Payer: Medicaid Other | Admitting: Family Medicine

## 2021-06-18 ENCOUNTER — Encounter: Payer: Self-pay | Admitting: Family Medicine

## 2021-06-18 VITALS — BP 130/94 | HR 82 | Ht 68.0 in | Wt 299.2 lb

## 2021-06-18 DIAGNOSIS — E559 Vitamin D deficiency, unspecified: Secondary | ICD-10-CM | POA: Diagnosis not present

## 2021-06-18 DIAGNOSIS — Z0000 Encounter for general adult medical examination without abnormal findings: Secondary | ICD-10-CM

## 2021-06-18 DIAGNOSIS — T39315A Adverse effect of propionic acid derivatives, initial encounter: Secondary | ICD-10-CM

## 2021-06-18 DIAGNOSIS — Z113 Encounter for screening for infections with a predominantly sexual mode of transmission: Secondary | ICD-10-CM

## 2021-06-18 DIAGNOSIS — R7303 Prediabetes: Secondary | ICD-10-CM | POA: Diagnosis not present

## 2021-06-18 LAB — POCT GLYCOSYLATED HEMOGLOBIN (HGB A1C): Hemoglobin A1C: 5.2 % (ref 4.0–5.6)

## 2021-06-18 MED ORDER — VITAMIN D (CHOLECALCIFEROL) 50 MCG (2000 UT) PO CAPS: 2.0000 | ORAL_CAPSULE | Freq: Every day | ORAL | 3 refills | Status: DC

## 2021-06-18 NOTE — Patient Instructions (Addendum)
Call about weight loss surgery:  ?[facility] ?Address: [address] [place], [place], [place] [address] ?Phone: [phone] ? ?With your history of Vit D being low: Get at least 30 minutes sun daily or take 3000 IU of vitamin D daily.  ? ? ?It was great seeing you today.  I'll see your Friday to discuss.  ? ?Visit Remembers: ?- Stop by the pharmacy to pick up your prescriptions  ?- Continue to work on your healthy eating habits and incorporating exercise into your daily life.  ?- Your goal is to have an BP < 130/80 ? ? ?Regarding lab work today:  ?Due to recent changes in healthcare laws, you may see the results of your imaging and laboratory studies on MyChart before your provider has had a chance to review them.  I understand that in some cases there may be results that are confusing or concerning to you. Not all laboratory results come back in the same time frame and you may be waiting for multiple results in order to interpret others.  Please give [name] 72 hours in order for your provider to thoroughly review all the results before contacting the office for clarification of your results. If everything is normal, you will get a letter in the mail or a message in My Chart. Please give [name] a call if you do not hear from [name] after 2 weeks. ? ?Please bring all of your medications with you to each visit.  ? ? ?Feel free to call with any questions or concerns at any time, at [phone]. ?  ?Take care,  ?Dr. Susa Simmonds ?[place]  ? ? ? ?
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
## 2021-06-19 LAB — LIPID PANEL
Chol/HDL Ratio: 5.1 ratio — ABNORMAL HIGH (ref 0.0–4.4)
Cholesterol, Total: 137 mg/dL (ref 100–199)
HDL: 27 mg/dL — ABNORMAL LOW (ref >39)
LDL Chol Calc (NIH): 70 mg/dL (ref 0–99)
Triglycerides: 243 mg/dL — ABNORMAL HIGH (ref 0–149)
VLDL Cholesterol Cal: 40 mg/dL (ref 5–40)

## 2021-06-19 LAB — BASIC METABOLIC PANEL
BUN/Creatinine Ratio: 16 (ref 9–23)
BUN: 11 mg/dL (ref 6–20)
CO2: 23 mmol/L (ref 20–29)
Calcium: 9.5 mg/dL (ref 8.7–10.2)
Chloride: 105 mmol/L (ref 96–106)
Creatinine, Ser: 0.69 mg/dL (ref 0.57–1.00)
Glucose: 68 mg/dL — ABNORMAL LOW (ref 70–99)
Potassium: 4.6 mmol/L (ref 3.5–5.2)
Sodium: 141 mmol/L (ref 134–144)
eGFR: 115 mL/min/{1.73_m2} (ref >59)

## 2021-06-19 LAB — HIV ANTIBODY (ROUTINE TESTING W REFLEX): HIV Screen 4th Generation wRfx: NONREACTIVE

## 2021-06-19 LAB — HCV AB W REFLEX TO QUANT PCR: HCV Ab: NONREACTIVE

## 2021-06-19 LAB — HCV INTERPRETATION

## 2021-06-20 LAB — RPR W/REFLEX TO TREPSURE: RPR: NONREACTIVE

## 2021-06-20 LAB — T PALLIDUM ANTIBODY, EIA: T pallidum Antibody, EIA: NEGATIVE

## 2021-06-21 NOTE — Assessment & Plan Note (Signed)
Advised daily supplementation and sun exposure (wearing sunscreen).  ?

## 2021-06-22 ENCOUNTER — Encounter: Payer: Self-pay | Admitting: Family Medicine

## 2021-06-22 ENCOUNTER — Ambulatory Visit (INDEPENDENT_AMBULATORY_CARE_PROVIDER_SITE_OTHER): Payer: Medicaid Other | Admitting: Family Medicine

## 2021-06-22 ENCOUNTER — Ambulatory Visit
Admission: RE | Admit: 2021-06-22 | Discharge: 2021-06-22 | Disposition: A | Discharge status: Home / Self Care | Payer: Medicaid Other | Source: Ambulatory Visit | Attending: Family Medicine | Admitting: Family Medicine

## 2021-06-22 ENCOUNTER — Other Ambulatory Visit (HOSPITAL_COMMUNITY)
Admission: RE | Admit: 2021-06-22 | Discharge: 2021-06-22 | Disposition: A | Discharge status: Home / Self Care | Payer: Medicaid Other | Source: Ambulatory Visit | Attending: Family Medicine | Admitting: Family Medicine

## 2021-06-22 VITALS — BP 124/78 | HR 80 | Wt 300.0 lb

## 2021-06-22 DIAGNOSIS — M7731 Calcaneal spur, right foot: Secondary | ICD-10-CM | POA: Diagnosis not present

## 2021-06-22 DIAGNOSIS — M79671 Pain in right foot: Secondary | ICD-10-CM | POA: Diagnosis not present

## 2021-06-22 DIAGNOSIS — L732 Hidradenitis suppurativa: Secondary | ICD-10-CM

## 2021-06-22 DIAGNOSIS — Z113 Encounter for screening for infections with a predominantly sexual mode of transmission: Secondary | ICD-10-CM

## 2021-06-22 DIAGNOSIS — M159 Polyosteoarthritis, unspecified: Secondary | ICD-10-CM | POA: Diagnosis not present

## 2021-06-22 MED ORDER — NAPROXEN 500 MG PO TABS: 500.0000 mg | ORAL_TABLET | Freq: Two times a day (BID) | ORAL | 0 refills | Status: DC

## 2021-06-22 MED ORDER — DOXYCYCLINE HYCLATE 50 MG PO CAPS: 50.0000 mg | ORAL_CAPSULE | Freq: Two times a day (BID) | ORAL | 2 refills | Status: AC

## 2021-06-22 NOTE — Progress Notes (Signed)
? ?  SUBJECTIVE:  ? ?CHIEF COMPLAINT / HPI:  ? ? ?Mackenzie Gibson is a 36 y.o. female here for:  ? ? ?Left heel pain  ?Patient reports right heel pain that started about 3 months ago.  States the pain is worsening.  Pain is worse when she first wakes up and starts walking in the morning.  Feels like " a hammer smash the back of my heel."  Pain worse with walking and standing long periods of time especially at her job.  She has been taking 800 mg of Advil daily.  This is not taking the pain completely away.  Feels like there is a growth on the back of her foot.  She switched shoes from memory foam to work crocs as crocs don't make feet hurt as bad.  This is a new pain for her ? ? ?Hidradenitis suppuratia ?Feels like she is having a flare.  Has lesions on her thighs, stomach, under breasts and armpits. ? ? ?PERTINENT  PMH / PSH: reviewed and updated as appropriate  ? ?OBJECTIVE:  ? ?BP 124/78   Pulse 80   Wt 300 lb (136.1 kg)   LMP 09/29/2019   BMI 45.61 kg/m?   ? ?GEN: well appearing female in no acute distress  ?CVS: well perfused  ?RESP: speaking in full sentences without pause, no respiratory distress  ?MSK: Right ankle: Tender to palpation Achilles insertion.  No visible erythema, swelling or ecchymosis.  Questionable bony outgrowth at the heel compared to left ankle.  Negative anterior posterior drawer.  Normal Thompson test.  Pain with plantar and dorsiflexion.  Strength 5/5 plantarflexion, dorsiflexion, eversion and inversion ?SKIN: Erythematous boils and papules underneath breast, abdominal pannus, axilla ? ? ?ASSESSMENT/PLAN:  ? ?Right heel pain ?History and exam consistent with Achilles tendinitis.  Thompson's test is normal doubt Achilles rupture.  Suspect bone spur may be aggravating the tendon.  Obtain foot x-ray.  Naprosyn 500 mg twice daily as needed with Tylenol 1000 mg 3 times daily as needed.  Recent BMP reviewed with normal creatinine.  Consider referral to sports medicine if not  improving. ? ?Hidradenitis suppurativa ?Acute flare Resume 50 mg BID doxycycline.  Continue PanOxyl body wash.  ?  ?Right Knee and Low Back Pain  ?History of osteoarthritis.  Reviewed lumbar x-ray from December 2022 and right knee x-rays from March 2022.  Naprosyn as above.  Discussed referral to physical therapy and patient agreeable. ? ?[name], DO ?PGY-3, [facility] ?06/22/2021  ? ? ? ? ? ? ? ? ?

## 2021-06-22 NOTE — Patient Instructions (Signed)
It was great seeing you today!  ? ?As discusssed, take Naprosyn twice a day as needed for heel pain. You can take Tylenol 1000 mg three times a day as needed for pain as well.   ?  ?I'd like to see you back in 4 weeks if your pain does not improve but if you need to be seen earlier than that for any new issues we're happy to fit you in, just give [name] a call! ? ? ?Go to one of the addresses below to get an xray of your foot  ?A ?[facility] ?[place] [facility] ? [phone] ?Open ? Closes 5:30PM ? ?B ?[facility] ?[place] ? [phone] ?Open ? Closes 5PM ? ? ?A referral was placed for physical therapy for your back and knee pain.  Please allow 2 weeks before calling to check on the status of your referral.   ?

## 2021-06-22 NOTE — Assessment & Plan Note (Addendum)
Acute flare Resume 50 mg BID doxycycline.  Continue PanOxyl body wash.  ?

## 2021-06-25 ENCOUNTER — Encounter: Payer: Self-pay | Admitting: Family Medicine

## 2021-06-25 LAB — URINE CYTOLOGY ANCILLARY ONLY
Chlamydia: NEGATIVE
Comment: NEGATIVE
Comment: NEGATIVE
Comment: NORMAL
Neisseria Gonorrhea: NEGATIVE
Trichomonas: NEGATIVE

## 2021-06-28 ENCOUNTER — Other Ambulatory Visit: Payer: Self-pay | Admitting: Family Medicine

## 2021-06-28 DIAGNOSIS — M79671 Pain in right foot: Secondary | ICD-10-CM

## 2021-07-06 ENCOUNTER — Encounter: Payer: Self-pay | Admitting: Family Medicine

## 2021-07-06 ENCOUNTER — Ambulatory Visit: Payer: Medicaid Other | Admitting: Family Medicine

## 2021-07-06 DIAGNOSIS — M79671 Pain in right foot: Secondary | ICD-10-CM | POA: Diagnosis not present

## 2021-07-06 MED ORDER — GABAPENTIN 100 MG PO CAPS: ORAL_CAPSULE | ORAL | 0 refills | Status: DC

## 2021-07-06 MED ORDER — MELOXICAM 15 MG PO TABS: 15.0000 mg | ORAL_TABLET | Freq: Every day | ORAL | 2 refills | Status: DC

## 2021-07-06 NOTE — Patient Instructions (Addendum)
? ? ? ?  We are also placing some shoe inserts in both your left shoe in the right shoe.  Let see if those help.   ? ?Stop taking the ibuprofen and Aleve/Naprosyn, and start taking 1 meloxicam in the morning with food.   ? ?I have also called in some gabapentin.  You can take 1 3 times a day but it may make you sleepy so try it at home first.  If it does make you sleepy and you cannot take it at work, then you can take 1 when you get home from work and 1 at bedtime.  This will gradually help with the pain.  Does not going to immediately get rid of the pain. ? ?  We will have to see how that affects you as far as drowsiness with there or not you can take it at work.  I would like to see you back in 1 week.  I do not expect miraculous improvement by that time but we will see if you have had 5 or 10% improvement with conservative rest.  This will probably be a 2 or 84-monthprocess. ? ?Be sure to bring your work shoes with you at next office visit and will make you some custom molded orthotics.  We also may consider corticosteroid injection or nitroglycerin patch therapy or other. ? ?Please call if you have any questions in the interim.  It is nice to meet you! ?

## 2021-07-06 NOTE — Progress Notes (Signed)
?  Mackenzie Gibson - 36 y.o. female MRN [id number]  Date of birth: 1985/11/03 ? ? ? ?SUBJECTIVE:    ?  ?Chief Complaint:/ HPI:  ?3 to 4 months of right heel pain.  Has pain on the plantar portion of the heel at the origin of the fascia and has also developed posterior heel pain.  Notes that she has been walking differently with her foot externally rotated to decrease pain.  She works standing on her feet most days.  No prior injury.  Pain is 10 out of 10 at times.  Worse first step in the morning.  Keeping her from doing many activities.  No numbness or tingling in her toes. ? ? ?OBJECTIVE: BP 131/85   Ht '5\' 8"'$  (1.727 m)   Wt 300 lb (136.1 kg)   LMP 09/29/2019   BMI 45.61 kg/m?   ?Physical Exam:  Vital signs are reviewed. ?general: Well-developed female no acute distress BMI 45. ?Feet: Bilateral pes planus.Some fat herniation medially on bilateral heels ?Extremely tender to palpation at the origin of the plantar fascia and this reproduces her pain.  Also has positive Tinel test with pain at the plantar nerve on the medial heel prior to it split into medial and lateral plantar nerves.  Mild tenderness palpation posterior heel over the retrocalcaneal bursa area.  Achilles is intact.  Minimal pain with resisted plantar flexion dorsiflexion. ? ?ASSESSMENT & PLAN: ? ?See problem based charting & AVS for pt instructions. ?No problem-specific Assessment & Plan notes found for this encounter. ?#1.  Planter fasciitis right heel ?2.  Plantar nerve neuritis ?3.  Retrocalcaneal bursitis ?PLAN: I think the retrocalcaneal bursitis in the neuritis are secondary to abnormal gait from compensation pain in her plantar fascia.  We tried her in a cam walker boot as I wanted to totally restrict motion for the next week or 2 but she found it very uncomfortable.  We then tried her in a postop shoe with shoe insert on that side including scaphoid pad.  Follow-up 1 week.  Consider corticosteroid injection at that time.  We will also do  evaluation for possible orthotics. ?

## 2021-07-06 NOTE — Progress Notes (Signed)
PCP: [name], DO ? ?Subjective:  ? ?HPI: ?Patient is a 36 y.o. female here for right achilles and heel pain x4 months. Patient describes sudden-onset of sharp and burning pain at the distal achilles tendon and medial/posterior calcaneus that has gotten worse in the last month. The pain is constant and begins right when she first gets out of bed in the mornings. She's tried stretch exercises, changing to different shoes like Crocs and using inserts, but none of these have helped. Naproxen 500 mg twice a day or Advil 800 mg twice a day relieves the pain. The pain has made it difficult to ambulate,  and for the last 3 months she has been externally rotating the right foot when she walks, which helps with the heel pain. No history of recent trauma or falls. Patient had X-rays of her foot on 04/21 that was normal except for small superior/inferior calcaneal spurs.  ? 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  ? ?Past Medical History:  ?Diagnosis Date  ? Anxiety   ? Arthritis   ? Back pain   ? Bipolar disorder ([place])   ? Bipolar disorder ([place])   ? Chronic hypertension in pregnancy 03/02/2015  ? Constipation   ? Depression   ? Family history of adverse reaction to anesthesia   ? mom "woke up during" surgery   ? Gallbladder problem   ? High blood pressure   ? HPV (human papilloma virus) infection   ? Joint pain   ? Morbid obesity ([place])   ? PCOS (polycystic ovarian syndrome)   ? PONV (postoperative nausea and vomiting)   ? Pregnancy induced hypertension   ? SOBOE (shortness of breath on exertion)   ? Swallowing difficulty   ? ? ?Current Outpatient Medications on File Prior to Visit  ?Medication Sig Dispense Refill  ? Cholecalciferol (VITAMIN D3) 50 MCG (2000 UT) CAPS Take 2 capsules (4,000 Units total) by mouth daily. 30 capsule 3  ? diclofenac Sodium (VOLTAREN) 1 % GEL Apply 4 g  topically 4 (four) times daily. 150 g 1  ? doxycycline (VIBRAMYCIN) 50 MG capsule Take 1 capsule (50 mg total) by mouth 2 (two) times daily. 60 capsule 2  ? metFORMIN (GLUCOPHAGE) 500 MG tablet '250mg'$  po with lunch daily 15 tablet 0  ? ondansetron (ZOFRAN ODT) 4 MG disintegrating tablet Take 1 tablet (4 mg total) by mouth every 8 (eight) hours as needed for nausea or vomiting. 10 tablet 0  ? polyethylene glycol powder (GLYCOLAX/MIRALAX) 17 GM/SCOOP powder Take 17 g by mouth daily. For clean out put 8 capfuls in 32 oz of clear liquid and drink over the next 4 hours. 3350 g 1  ? Vitamin D, Ergocalciferol, (DRISDOL) 1.25 MG (50000 UNIT) CAPS capsule Take 1 capsule (50,000 Units total) by mouth every 7 (seven) days. 10 capsule 0  ? ?No current facility-administered medications on file prior to visit.  ? ? ?Past Surgical History:  ?Procedure Laterality Date  ? CHOLECYSTECTOMY    ? EYE SURGERY    ? TUBAL LIGATION Bilateral 03/03/2015  ? Procedure: POST PARTUM TUBAL LIGATION;  Surgeon: Guss Bunde, MD;  Location: [facility];  Service: Gynecology;  Laterality: Bilateral;  ? VAGINAL HYSTERECTOMY Bilateral 10/26/2019  ? Procedure: HYSTERECTOMY VAGINAL;  Surgeon: Chancy Milroy, MD;  Location: [facility];  Service: Gynecology;  Laterality: Bilateral;  ? ? ?  Allergies  ?Allergen Reactions  ? Hydrocodone Anaphylaxis and Other (See Comments)  ?   Tongue swelling  ? ? ?BP 131/85   Ht '5\' 8"'$  (1.727 m)   Wt 300 lb (136.1 kg)   LMP 09/29/2019   BMI 45.61 kg/m?  ? ?   ? View : No data to display.  ?  ?  ?  ? ? ?   ? View : No data to display.  ?  ?  ?  ? ? ?    ?Objective:  ?Physical Exam: ? ?Gen: Anxious, uncomfortable in exam room ?Lungs: No increased WOB  ? ?MSK: right foot -- pes planus. Corpus adiposum along medial arch. No changes in skin color or swelling. Acutely TTP at distal achilles tendon. Mildly TTP at posterior calcaneus. Acutely TTP at medial insertion of plantar fascia. Otherwise not TTP on dorsal foot  or plantar foot. Pain with passive dorsiflexion and plantarflexion and against resistance. Pain with eversion and inversion of foot. Strength with dorsiflexion and plantarflexion 4/5. Tinel's test at medial insertion plantar fascia. positive. Anterior drawer negative. Talar tilt negative. Calcaneal compression test negative. Thompson's test negative.  ? ?Neurovascular intact distally ? ?Right foot X-rays:  ?No fractures or dislocations. Small superior and inferior calcaneal spurs present. No soft tissue changes that can be noted. ?  ?Assessment & Plan:  ?1. Possible right plantar fasciitis vs achilles tendinitis --  ?Patient is a 36 y.o. female who is anxious and frustrated by four months of right distal achilles tendon and posterior calcaneal pain that is affecting her ability to walk. Physical exam is suggestive of plantar fasciitis (tenderness at medial insertion of plantar fascia, pain with dorsiflexion, pain at medial heel and hindfoot). Patient has several risk factors for plantar fasciitis, including pes planus, being middle age, higher BMI, and working long hours on hard surface floors. The pain at the distal achilles tendon could be secondary to chronic adjustment of her gait these past few months. Her physical exam is reassuring that she doesn't have any injury of the achilles tendon (negative Thompson's, full ROM).  ? ?We discussed conservative treatment for plantar fasciitis, which may take 3 months or more before it gets better. We tried a boot in clinic which was too uncomfortable for her, so we went with a post-op shoe which she liked better. We also provided arch support pads with foot inserts. We agreed to follow up in one week to re-evaluate. She requested medication for pain control, so we will try gabapentin and meloxicam (patient is allergic to hydrocodone, so holding off on trying tramadol).  ? ?If patient is not improving, we could consider mold inserts, cortisol shot, although this option  makes her anxious as she has a fear of needles. Patient could also consider shockwave treatment for plantar fasciitis.  ? ?Dominica Severin, MS3  ?[facility] ?

## 2021-07-09 ENCOUNTER — Ambulatory Visit: Payer: Medicaid Other | Attending: Family Medicine

## 2021-07-09 DIAGNOSIS — M5459 Other low back pain: Secondary | ICD-10-CM | POA: Diagnosis not present

## 2021-07-09 DIAGNOSIS — M159 Polyosteoarthritis, unspecified: Secondary | ICD-10-CM | POA: Insufficient documentation

## 2021-07-09 DIAGNOSIS — M25571 Pain in right ankle and joints of right foot: Secondary | ICD-10-CM | POA: Diagnosis not present

## 2021-07-09 NOTE — Therapy (Addendum)
[facility] [place], [place], [address] Phone: [phone]   Fax:  [phone]  Physical Therapy Evaluation  Patient Details  Name: Mackenzie Gibson MRN: [id number] Date of Birth: 1985/11/18 Referring Provider (PT): Ardelia Mems, MD   Encounter Date: 07/09/2021   PT End of Session - 07/09/21 0948     Visit Number 1    Number of Visits 10    Date for PT Re-Evaluation 09/28/21    PT Start Time 0950    PT Stop Time 1030    PT Time Calculation (min) 40 min    Activity Tolerance Patient tolerated treatment well    Behavior During Therapy [facility] for tasks assessed/performed             Past Medical History:  Diagnosis Date   Anxiety    Arthritis    Back pain    Bipolar disorder ([place])    Bipolar disorder ([place])    Chronic hypertension in pregnancy 03/02/2015   Constipation    Depression    Family history of adverse reaction to anesthesia    mom "woke up during" surgery    Gallbladder problem    High blood pressure    HPV (human papilloma virus) infection    Joint pain    Morbid obesity ([place])    PCOS (polycystic ovarian syndrome)    PONV (postoperative nausea and vomiting)    Pregnancy induced hypertension    SOBOE (shortness of breath on exertion)    Swallowing difficulty     Past Surgical History:  Procedure Laterality Date   CHOLECYSTECTOMY     EYE SURGERY     TUBAL LIGATION Bilateral 03/03/2015   Procedure: POST PARTUM TUBAL LIGATION;  Surgeon: Guss Bunde, MD;  Location: [facility];  Service: Gynecology;  Laterality: Bilateral;   VAGINAL HYSTERECTOMY Bilateral 10/26/2019   Procedure: HYSTERECTOMY VAGINAL;  Surgeon: Chancy Milroy, MD;  Location: [facility];  Service: Gynecology;  Laterality: Bilateral;    There were no vitals filed for this visit.    Subjective Assessment - 07/09/21 0948     Subjective Patient reports that her back has been bothering her for about 6 months and her right  heel has been hurting for about 4 months. She saw a sports medicine doctor last week and was given a boot to wear. However, she "does not like it" and will not wear it. She notes that she loves that insoles that she was given.  She did not have back pain until she fell last year when she slipped on her steps in December. She notes that when her pain is bad she will sleep all day. She has needed to modify her work schedule due to her pain. She has never had any pain like this before. She notes that her pain was hurting so bad that she was unable to get up and go to the bathroom. She was told to rest by her sports doctor.    Limitations Walking    How long can you walk comfortably? hurts to walk around her house    Patient Stated Goals reduced pain,    Currently in Pain? Yes    Pain Score 10-Worst pain ever    Pain Location Back    Pain Orientation Right    Pain Type Chronic pain    Pain Radiating Towards none reported    Pain Onset More than a month ago    Pain Frequency Intermittent    Aggravating  Factors  walking/standing 5-6 hours at work, lifting    Pain Relieving Factors sitting, resting    Effect of Pain on Daily Activities limits her at work    Multiple Pain Sites Yes    Pain Score 10    Pain Location Ankle    Pain Orientation Right    Pain Type Chronic pain    Pain Radiating Towards none    Pain Onset More than a month ago    Pain Frequency Constant    Aggravating Factors  sitting, standing, walking    Pain Relieving Factors sleeping    Effect of Pain on Daily Activities limits her ability to complete household activities due to pain                [facility] PT Assessment - 07/09/21 0001       Assessment   Medical Diagnosis OA of multiple joints    Referring Provider (PT) Ardelia Mems, MD    Hand Dominance Right    Next MD Visit none with referring provider   Sports medicine: 07/13/21   Prior Therapy No      Precautions   Precautions None      Restrictions   Weight  Bearing Restrictions No      Balance Screen   Has the patient fallen in the past 6 months Yes    How many times? 1   fall from steps in November/December 2022   Has the patient had a decrease in activity level because of a fear of falling?  No    Is the patient reluctant to leave their home because of a fear of falling?  No      Home Ecologist residence    Living Arrangements Children;Spouse/significant other    [place] to enter    Entrance Stairs-Number of Steps 4    Entrance Stairs-Rails Left    [place] One level      Prior Function   Level of Independence Independent    Vocation Full time employment    Vocation Requirements 8-9 hours      Cognition   Overall Cognitive Status Within Functional Limits for tasks assessed    Attention Focused    Focused Attention Appears intact    Memory Appears intact    Awareness Appears intact    Problem Solving Appears intact      Observation/Other Assessments   Other Surveys  Oswestry Disability Index;Lower Extremity Functional Scale    Oswestry Disability Index  44%   22/50   Lower Extremity Functional Scale  16.25%   13/80     Sensation   Additional Comments Patient reports no numbness or tingling      ROM / Strength   AROM / PROM / Strength Strength;AROM      AROM   AROM Assessment Site Lumbar;Ankle    Right/Left Ankle Right;Left    Right Ankle Dorsiflexion -8   limited by pain   Right Ankle Plantar Flexion 49   limited by pain   Right Ankle Inversion 22    Right Ankle Eversion 15    Left Ankle Dorsiflexion 4    Left Ankle Plantar Flexion 42    Left Ankle Inversion 34    Left Ankle Eversion 22    Lumbar Flexion 50    Lumbar Extension 10    Lumbar - Right Side Bend 50% limited   familiar low back pain   Lumbar - Left Side Bend 50% limited  nonpainful   Lumbar - Right Rotation WFL   right sided low back pain   Lumbar - Left Rotation WFL   nonpainful     Strength   Strength  Assessment Site Hip;Knee;Ankle    Right/Left Hip Right;Left    Right Hip Flexion 4/5   familiar low back pain   Left Hip Flexion 4/5    Right/Left Knee Right;Left    Right Knee Flexion 3+/5   limited by right ankle pain   Right Knee Extension 5/5    Left Knee Flexion 5/5    Left Knee Extension 5/5    Right/Left Ankle Right;Left    Right Ankle Dorsiflexion 3/5   painful   Left Ankle Dorsiflexion 4-/5      Palpation   Palpation comment TTP: right achilles tendon, achilles insertion, plantar fascia, QL, and obliques      Special Tests    Special Tests Ankle/Foot Special Tests    Ankle/Foot Special Tests  Great Toe Extension Test      Great Toe Extension Test    Comments Positive on RLE      Ambulation/Gait   Ambulation/Gait Yes    Ambulation/Gait Assistance 6: Modified independent (Device/Increase time)    Assistive device None    Gait Pattern Antalgic    Ambulation Surface Level;Indoor    Gait velocity decreased                        Objective measurements completed on examination: See above findings.                     PT Long Term Goals - 07/09/21 1556       PT LONG TERM GOAL #1   Title Patient will be independent with her HEP.    Time 5    Period Weeks    Status New    Target Date 08/13/21      PT LONG TERM GOAL #2   Title Patient will be able to demonstrate at least 4 degrees of right ankle AROM for improved gait mechanics.    Time 5    Period Weeks    Status New    Target Date 08/13/21      PT LONG TERM GOAL #3   Title Patient will be able to complete her daily activities without her familiar symptoms exceeding 7/10.    Time 5    Period Weeks    Status New    Target Date 08/13/21      PT LONG TERM GOAL #4   Title Patient will be able to improve her LEFS score to at least 26% for improved functional mobility.    Time 5    Period Weeks    Status New    Target Date 08/13/21                    Plan -  07/09/21 1548     Clinical Impression Statement Patient is a 36 year old female presenting to physical therapy with chronic right sided low back and right ankle pain. She presented with moderate to high pain severity and irritability with AROM and palpation to these areas being able to reproduce her familiar pain. She also exhibited an altered gait pattern due to her pain. She would benefit from skilled physical therapy to address her remaining impairments to return to maximize her functional mobility.    Personal Factors and Comorbidities Time since onset of  injury/illness/exacerbation;Other    Examination-Activity Limitations Locomotion Level;Transfers;Bend;Carry    Examination-Participation Restrictions Occupation    Stability/Clinical Decision Making Evolving/Moderate complexity    Clinical Decision Making Moderate    Rehab Potential Fair    PT Frequency 2x / week    PT Duration Other (comment)   5 weeks   PT Treatment/Interventions Neuromuscular re-education;Therapeutic exercise;Therapeutic activities;Functional mobility training;Patient/family education;Manual techniques;Gait training;Passive range of motion    PT Next Visit Plan nustep, lower trunk rotations, core strengthening progression, gastroc/soleus stretching    Consulted and Agree with Plan of Care Patient             Patient will benefit from skilled therapeutic intervention in order to improve the following deficits and impairments:  Abnormal gait, Decreased range of motion, Difficulty walking, Pain, Decreased activity tolerance, Decreased mobility, Decreased strength  Visit Diagnosis: Pain in right ankle and joints of right foot  Other low back pain     Problem List Patient Active Problem List   Diagnosis Date Noted   Pain of right heel 07/06/2021   NSAID long-term use 02/01/2021   Low back pain 02/01/2021   Chronic constipation 11/26/2020   Vitamin D deficiency 09/28/2020   Insulin resistance 09/28/2020    Class 3 severe obesity with serious comorbidity and body mass index (BMI) of 45.0 to 49.9 in adult ([place]) 09/21/2020   h/o Secondary hypertension ( w/ pregnancy) 08/24/2020   Other fatigue 08/24/2020   SOBOE (shortness of breath on exertion) 08/24/2020   PCOS (polycystic ovarian syndrome) 08/24/2020   Tobacco abuse 08/24/2020   Right knee pain 05/10/2020   Leukocytosis 02/01/2020   Hand edema 02/01/2020   Post-operative state 10/26/2019   Anxiety 02/13/2019   Hidradenitis suppurativa 04/23/2018   Migraine 12/05/2017   S/P tubal ligation 03/03/2015   Asthmatic bronchitis , chronic ([place]) 02/09/2015   Obesity, morbid ([place])    Bipolar disorder, mixed ([place]) 01/12/2014   Chronic pelvic pain in female 01/12/2014   Eczema, dyshidrotic 12/20/2013   Tobacco use disorder 03/06/2009   Morbid obesity ([place]) 10/20/2006   PANIC DISORDER 10/20/2006   POLYCYSTIC OVARIAN DISEASE 03/04/2004    Darlin Coco, PT 07/09/2021, 4:04 PM  [facility] [address] [place], [place], [address] Phone: [phone]   Fax:  [phone]  Name: Mackenzie Gibson MRN: [id number] Date of Birth: Dec 20, 1985  PHYSICAL THERAPY DISCHARGE SUMMARY  Visits from Start of Care: 1  Current functional level related to goals / functional outcomes: Patient is being discharged as she did not return to physical therapy following her evaluation.    Remaining deficits: See evaluation   Education / Equipment: Educated on the benefits of PT    Patient agrees to discharge. Patient goals were not met. Patient is being discharged due to not returning since the last visit.  Jacqulynn Cadet, PT, DPT

## 2021-07-13 ENCOUNTER — Ambulatory Visit (INDEPENDENT_AMBULATORY_CARE_PROVIDER_SITE_OTHER): Payer: Medicaid Other | Admitting: Family Medicine

## 2021-07-13 VITALS — BP 136/83 | Ht 68.0 in | Wt 300.0 lb

## 2021-07-13 DIAGNOSIS — M79671 Pain in right foot: Secondary | ICD-10-CM | POA: Diagnosis not present

## 2021-07-13 MED ORDER — LIDOCAINE 5 % EX OINT: TOPICAL_OINTMENT | CUTANEOUS | 0 refills | Status: AC

## 2021-07-13 NOTE — Progress Notes (Signed)
PCP: [name], DO ? ?Subjective:  ? ?HPI: ?Patient is a 36 y.o. female presenting for follow up of right heel pain that started over 4 months ago. At the last appointment (05/05) we discussed management for plantar fasciitis, plantar nerve neuritis, and retrocalcaneal bursitis. Patient was give postop shoe to wear, which she's been wearing about 2-3 hours per day (was not able to wear it at work).  ? ?She tried Meloxicam twice but it did not help. She liked the inserts we gave her last time and thinks it helped. Patient reports that she's not having pain at the plantar surface of her foot anymore. Patient's primary concern today is the constant burning at the retrocalcaneal area above a bony prominence resembling Haglund deformity which she thinks is a bone spur.  ? ? ? ?BP 136/83   Ht '5\' 8"'$  (1.727 m)   Wt 300 lb (136.1 kg)   LMP 09/29/2019   BMI 45.61 kg/m?  ? ? ? ?    ?Objective:  ?Physical Exam: ? ?Gen: NAD, comfortable in exam room.  ?Pysch: Tfrustrated by burning pain at the right retrocalcaneal.  ? ?MSK: Right foot --  ?Mild Haglund deformity. No swelling or bruising. TTP at posterior calcaneus over retrocalcaneal bursa area. Not TTP at the origin of the plantar fascia. No TTP along achilles tendon. Full ROM intact. Negative talar tilt test.  ?Achilles intact and full strength, nontender to palpation, no defect. ? ?Neurovascular intact distally.  ? ?  ?Assessment & Plan:  ?1. Retrocalcaneal bursitis --  ?2 ? ?In summary, we discussed the following plan:  ?Lidocaine cream for pain.  ?Try physical therapy for a few weeks.  ?Continue wearing insoles, call/return if you need another one.  ?Follow up appointment in 3-4 weeks.  ? ?We can consider orthotics but these are generally not covered by insurance. We can consider an injection in the future if she's still having a lot of pain.  ? ?Dominica Severin, MS4 ?[facility]  ? ?  ?

## 2021-07-13 NOTE — Patient Instructions (Addendum)
Lets try the lidocaine cream and have you go ahead with the physical therapy. Wear the insoles. ? ?Let me see you in a few weeks. Great to see yoU! ?

## 2021-07-14 ENCOUNTER — Encounter: Payer: Self-pay | Admitting: Family Medicine

## 2021-07-14 NOTE — Progress Notes (Signed)
[place] Attending Note: ?I have seen and examined this patient with the medical student. I have  reviewed the history, physical examination, assessment and plan as documented in the medical student's note.  I agree with the medical student's note and findings, assessment and treatment plan as documented with the following additions or changes:  ?Plantar fascia pain is resolved ?2. Plantar neruritis is resolved ?Continued but slightly improved pain at area of retrocalcaneal bursa. Explained difference in bone spur and mild haglund deformity. No specific tx for haglund deformity as I do not think  iut is causative. Continue insoles. Retry meloxicam if she wants. Lidocaine patches ?RTC 2-4 weeks. Could still consider custom molded orthotics but she brings her work shoes today and they are also Crocs, so unsure a custom molded orthotic would be beneficial. Changed out scaphoid pads to firmer / l;arger today. ?

## 2021-07-17 ENCOUNTER — Ambulatory Visit: Payer: Medicaid Other | Admitting: Physical Therapy

## 2021-08-07 ENCOUNTER — Encounter: Payer: Self-pay | Admitting: *Deleted

## 2021-08-10 ENCOUNTER — Ambulatory Visit (INDEPENDENT_AMBULATORY_CARE_PROVIDER_SITE_OTHER): Payer: Medicaid Other | Admitting: Family Medicine

## 2021-08-10 VITALS — BP 150/84 | Ht 67.0 in | Wt 300.0 lb

## 2021-08-10 DIAGNOSIS — M79671 Pain in right foot: Secondary | ICD-10-CM | POA: Diagnosis not present

## 2021-08-10 MED ORDER — NITROGLYCERIN 0.2 MG/HR TD PT24: MEDICATED_PATCH | TRANSDERMAL | 1 refills | Status: DC

## 2021-08-10 MED ORDER — NITROGLYCERIN 0.2 MG/HR TD PT24: MEDICATED_PATCH | TRANSDERMAL | 3 refills | Status: DC

## 2021-08-10 NOTE — Assessment & Plan Note (Signed)
Continues to have pain, mostly over the Achilles and retrocalcaneal bursa at this time.  She did request an injection, but discussed with her that this is last resort in this area as that is not helpful long-term and can lead to increased risk of tendon rupture.  She was given new green insoles, men size 9-9-1/2 with small scaphoid pads and placed a right heel lift into the shoe given her continued pain.  We will see how she does with this and also have her start nitro patches, given protocol and advised of side effects.  Follow-up in 6 weeks.  She is unable to tolerate a boot due to working restrictions.

## 2021-08-10 NOTE — Progress Notes (Unsigned)
   Mackenzie Gibson is a 36 y.o. female who presents to [facility] today for the following:  Right Heel Pain F/U Has been using green inserts with scaphoid pads Did PT once, but pain was so bad she couldn't go back Has also tried meloxicam without improvement Pain was also made worse by swimming in the pool Pain is worse with dorsiflexion and eversion She finds it more comfortable to walk with her foot everted States that the insoles are helping her some, but she finds that the scaphoid pads frequently fall off She reports that she has difficulty with squatting The pain is now mostly in the region of the Achilles, nothing extending along the plantar aspect of the foot Reports that lidocaine cream is sometimes helpful  PMH reviewed.  ROS as above. Medications reviewed.  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  No other obvious deformity, erythema, swelling, or ecchymosis, ulcers, calluses, blisters b/l - Palpation: There is TTP at retrocalcaneal bursa and at insertion of the Achilles at the calcaneus - Strength: Normal strength with dorsiflexion, plantarflexion, inversion, and eversion of foot; flexion and extension of toes b/l - ROM: Full ROM b/l - Neuro/vasc: NV intact distally bilaterally   No results found.   Assessment and Plan: 1) Pain of right heel Continues to have pain, mostly over the Achilles and retrocalcaneal bursa at this time.  She did request an injection, but discussed with her that this is last resort in this area as that is not helpful long-term and can lead to increased risk of tendon rupture.  She was given new green insoles, men size 9-9-1/2 with small scaphoid pads and placed a right heel lift into the shoe given her continued pain.  We will see how she does with this and also have her start nitro patches, given  protocol and advised of side effects.  Follow-up in 6 weeks.  She is unable to tolerate a boot due to working restrictions.   [place] Constable, D.O.  PGY-4 [facility]  08/10/2021 11:45 AM

## 2021-08-10 NOTE — Patient Instructions (Addendum)
Cut patch into one - fourth pieces Place a one fourth piece of patch on  skin over affected area, changing to a new piece every 24 hours.   You may experience a headache during the first 1-2 days and maybe up to 2 weeks of using the patch; these should improve and go away. If you experience headaches after beginning nitroglycerin patch treatment, you may take your preferred over the counter pain medicine such as tylenol or advil as directed on the box. Another side effect of the nitroglycerin patch can be skin irritation  or rash related to the patch adhesive.   Please notify our office if you develop  severe headaches or rash, and stop the patch.  Please call our office with any questions or problems. Tendon healing with nitroglycerin patch may require 12 to 24 weeks depending on the extent of injury. Men should not use if taking Viagra, Cialis, or Levitra as it may cause an unsafe lowering of the blood pressure..  Use with caution if you have migraines or rosacea.   Follow up with [name] in 6 weeks if needed.

## 2021-08-11 NOTE — Progress Notes (Signed)
[facility]: Attending Note: I have reviewed the chart, discussed wit the Sports Medicine Fellow. I agree with assessment and treatment plan as detailed in the [place] note. Continued pain.  Does seem to be localized more now to the retrocalcaneal bursa/Achilles tendon insertion area.  She has received some benefit from the temporary insoles and they are quite worn so we will replace those today and add a heel lift to see if we can take some pressure off the Achilles.  She asked about neck steps and I told her that full immobilization in the cam walker boot would probably be the next step if this does not improve and that is really not a great option for her because she cannot work in that so we decided today to start the nitroglycerin patch as an additional treatment.  Follow-up 4 weeks.

## 2021-09-21 ENCOUNTER — Ambulatory Visit: Payer: Medicaid Other | Admitting: Family Medicine

## 2021-10-03 ENCOUNTER — Other Ambulatory Visit: Payer: Self-pay

## 2021-10-03 DIAGNOSIS — E559 Vitamin D deficiency, unspecified: Secondary | ICD-10-CM

## 2021-10-04 MED ORDER — VITAMIN D (CHOLECALCIFEROL) 50 MCG (2000 UT) PO CAPS: 2.0000 | ORAL_CAPSULE | Freq: Every day | ORAL | 3 refills | Status: DC

## 2021-10-09 NOTE — Telephone Encounter (Signed)
Pt is scheduled to come in 08/16 for lab work

## 2021-10-10 ENCOUNTER — Encounter (INDEPENDENT_AMBULATORY_CARE_PROVIDER_SITE_OTHER): Payer: Self-pay

## 2021-10-11 ENCOUNTER — Other Ambulatory Visit: Payer: Self-pay | Admitting: Student

## 2021-10-11 DIAGNOSIS — E559 Vitamin D deficiency, unspecified: Secondary | ICD-10-CM

## 2021-10-16 ENCOUNTER — Other Ambulatory Visit: Payer: Self-pay | Admitting: Student

## 2021-10-16 DIAGNOSIS — E559 Vitamin D deficiency, unspecified: Secondary | ICD-10-CM

## 2021-10-17 ENCOUNTER — Other Ambulatory Visit: Payer: Medicaid Other

## 2021-11-13 ENCOUNTER — Ambulatory Visit
Admission: EM | Admit: 2021-11-13 | Discharge: 2021-11-13 | Disposition: A | Discharge status: Home / Self Care | Payer: Medicaid Other | Attending: Nurse Practitioner | Admitting: Nurse Practitioner

## 2021-11-13 DIAGNOSIS — J069 Acute upper respiratory infection, unspecified: Secondary | ICD-10-CM | POA: Insufficient documentation

## 2021-11-13 DIAGNOSIS — Z1152 Encounter for screening for COVID-19: Secondary | ICD-10-CM

## 2021-11-13 DIAGNOSIS — Z20818 Contact with and (suspected) exposure to other bacterial communicable diseases: Secondary | ICD-10-CM | POA: Diagnosis not present

## 2021-11-13 LAB — POCT RAPID STREP A (OFFICE): Rapid Strep A Screen: NEGATIVE

## 2021-11-13 MED ORDER — BENZONATATE 100 MG PO CAPS: 100.0000 mg | ORAL_CAPSULE | Freq: Three times a day (TID) | ORAL | 0 refills | Status: DC | PRN

## 2021-11-13 MED ORDER — BENZONATATE 100 MG PO CAPS: 100.0000 mg | ORAL_CAPSULE | Freq: Three times a day (TID) | ORAL | 0 refills | Status: AC | PRN

## 2021-11-13 NOTE — Discharge Instructions (Addendum)
Your symptoms and exam findings are most consistent with a viral upper respiratory infection. These usually run their course in about 10 days.  If your symptoms last longer than 10 days without improvement, please follow up with your primary care provider.  If your symptoms, worsen, please go to the Emergency Room.    We have tested you today for strep throat (negative; throat culture pending), and COVID-19 .  You will see the results in Mychart and we will call you with positive results.    Please stay home and isolate until you are aware of the results.    Some things that can make you feel better are: - Increased rest - Increasing fluid with water/sugar free electrolytes - Acetaminophen and ibuprofen as needed for fever/pain.  - Salt water gargling, chloraseptic spray and throat lozenges - OTC guaifenesin (Mucinex).  - Saline sinus flushes or a neti pot.  - Humidifying the air. - Tessalon perles for dry cough as needed every 8 hours.

## 2021-11-13 NOTE — ED Triage Notes (Signed)
Pt present sore throat with dizziness and chills, symptoms started on Saturday. Pt tried OTC medication with no relief

## 2021-11-13 NOTE — ED Provider Notes (Signed)
[facility]    CSN: [id number] Arrival date & time: 11/13/21  1000      History   Chief Complaint Chief Complaint  Patient presents with   Sore Throat    HPI Mackenzie Gibson is a 36 y.o. female.   Patient presents for a few days of body aches, nasal congestion, runny nose, sneezing, sore throat, nausea, decreased appetite, and fatigue.  She denies fever, chills, cough, shortness of breath/wheezing, chest pain or tightness, chest congestion, sinus pressure/headache, tooth or ear pain, abdominal pain, vomiting, diarrhea, and new rash.  Also reports she is having some dizzy spells; these have slowly improved over the weekend.  She has tried drinking more water and this seems to help.  Reports both of her children are currently sick with similar symptoms.  Reports she tested negative yesterday for COVID-19 at home.  Reports she has had 2 Moderna vaccines.  Taking ibuprofen and Tylenol for symptoms without much relief.    Past Medical History:  Diagnosis Date   Anxiety    Arthritis    Back pain    Bipolar disorder ([place])    Bipolar disorder ([place])    Chronic hypertension in pregnancy 03/02/2015   Constipation    Depression    Family history of adverse reaction to anesthesia    mom "woke up during" surgery    Gallbladder problem    High blood pressure    HPV (human papilloma virus) infection    Joint pain    Morbid obesity (H[facility])    PCOS (polycystic ovarian syndrome)    PONV (postoperative nausea and vomiting)    Pregnancy induced hypertension    SOBOE (shortness of breath on exertion)    Swallowing difficulty     Patient Active Problem List   Diagnosis Date Noted   Pain of right heel 07/06/2021   NSAID long-term use 02/01/2021   Low back pain 02/01/2021   Chronic constipation 11/26/2020   Vitamin D deficiency 09/28/2020   Insulin resistance 09/28/2020   Class 3 severe obesity with serious comorbidity and body mass index (BMI) of 45.0 to 49.9 in adult ([place])  09/21/2020   h/o Secondary hypertension ( w/ pregnancy) 08/24/2020   Other fatigue 08/24/2020   SOBOE (shortness of breath on exertion) 08/24/2020   PCOS (polycystic ovarian syndrome) 08/24/2020   Tobacco abuse 08/24/2020   Right knee pain 05/10/2020   Leukocytosis 02/01/2020   Hand edema 02/01/2020   Post-operative state 10/26/2019   Anxiety 02/13/2019   Hidradenitis suppurativa 04/23/2018   Migraine 12/05/2017   S/P tubal ligation 03/03/2015   Asthmatic bronchitis , chronic ([place]) 02/09/2015   Obesity, morbid ([place])    Bipolar disorder, mixed ([place]) 01/12/2014   Chronic pelvic pain in female 01/12/2014   Eczema, dyshidrotic 12/20/2013   Tobacco use disorder 03/06/2009   Morbid obesity ([place]) 10/20/2006   PANIC DISORDER 10/20/2006   POLYCYSTIC OVARIAN DISEASE 03/04/2004    Past Surgical History:  Procedure Laterality Date   CHOLECYSTECTOMY     EYE SURGERY     TUBAL LIGATION Bilateral 03/03/2015   Procedure: POST PARTUM TUBAL LIGATION;  Surgeon: Guss Bunde, MD;  Location: [facility];  Service: Gynecology;  Laterality: Bilateral;   VAGINAL HYSTERECTOMY Bilateral 10/26/2019   Procedure: HYSTERECTOMY VAGINAL;  Surgeon: Chancy Milroy, MD;  Location: [facility];  Service: Gynecology;  Laterality: Bilateral;    OB History     Gravida  5   Para  4   Term  4  Preterm  0   AB  1   Living  4      SAB  1   IAB  0   Ectopic  0   Multiple  0   Live Births  4            Home Medications    Prior to Admission medications   Medication Sig Start Date End Date Taking? Authorizing Provider  benzonatate (TESSALON) 100 MG capsule Take 1 capsule (100 mg total) by mouth 3 (three) times daily as needed for cough. Do not take with alcohol or while driving or operating heavy machinery 11/13/21   Eulogio Bear, NP  Cholecalciferol (VITAMIN D3) 50 MCG (2000 UT) CAPS Take 2 capsules (4,000 Units total) by mouth daily. 10/04/21   Erskine Emery, MD   diclofenac Sodium (VOLTAREN) 1 % GEL Apply 4 g topically 4 (four) times daily. Patient not taking: Reported on 07/09/2021 11/22/20   [name], DO  gabapentin (NEURONTIN) 100 MG capsule Take one by mouth 2 or 3 times a day for pain as directed. 07/06/21   Dickie La, MD  lidocaine (XYLOCAINE) 5 % ointment Apply a small amount to heel area 3 or 4 times a day as needed 07/13/21   Dickie La, MD  meloxicam (MOBIC) 15 MG tablet Take 1 tablet (15 mg total) by mouth daily. 07/06/21   Dickie La, MD  metFORMIN (GLUCOPHAGE) 500 MG tablet '250mg'$  po with lunch daily Patient not taking: Reported on 07/09/2021 11/23/20   Mellody Dance, DO  nitroGLYCERIN (NITRO-DUR) 0.2 mg/hr patch Cut patch into one - fourth pieces Place a one fourth piece of patch on  skin over affected area, changing to a new piece every 24 hours. 08/10/21   Dickie La, MD  nitroGLYCERIN (NITRODUR - DOSED IN MG/24 HR) 0.2 mg/hr patch Use 1/4 patch daily to the affected area 08/10/21   Dickie La, MD  ondansetron (ZOFRAN ODT) 4 MG disintegrating tablet Take 1 tablet (4 mg total) by mouth every 8 (eight) hours as needed for nausea or vomiting. 11/22/20   Brimage, Ronnette Juniper, DO  polyethylene glycol powder (GLYCOLAX/MIRALAX) 17 GM/SCOOP powder Take 17 g by mouth daily. For clean out put 8 capfuls in 32 oz of clear liquid and drink over the next 4 hours. 11/22/20   [name], DO  Vitamin D, Ergocalciferol, (DRISDOL) 1.25 MG (50000 UNIT) CAPS capsule Take 1 capsule (50,000 Units total) by mouth every 7 (seven) days. 02/16/21   [name], DO    Family History Family History  Problem Relation Age of Onset   Cancer Mother    Depression Mother    Bipolar disorder Mother    Rheum arthritis Mother    Cervical cancer Mother    Anxiety disorder Mother    Alcoholism Mother    Obesity Mother    Depression Father    High blood pressure Father    Cervical cancer Sister    Cervical cancer Maternal Grandmother    Bipolar disorder Maternal  Aunt     Social History Social History   Tobacco Use   Smoking status: Every Day    Packs/day: 0.50    Years: 14.00    Total pack years: 7.00    Types: Cigarettes   Smokeless tobacco: Never  Vaping Use   Vaping Use: Never used  Substance Use Topics   Alcohol use: Yes    Comment: occasionally, once/month   Drug use: No  Allergies   Hydrocodone   Review of Systems Review of Systems Per HPI  Physical Exam Triage Vital Signs ED Triage Vitals [11/13/21 1045]  Enc Vitals Group     BP 133/83     Pulse Rate 80     Resp 18     Temp 98.4 F (36.9 C)     Temp Source Oral     SpO2 97 %     Weight      Height      Head Circumference      Peak Flow      Pain Score 0     Pain Loc      Pain Edu?      Excl. in [place]?    No data found.  Updated Vital Signs BP 133/83 (BP Location: Left Arm)   Pulse 80   Temp 98.4 F (36.9 C) (Oral)   Resp 18   LMP 09/29/2019   SpO2 97%   Visual Acuity Right Eye Distance:   Left Eye Distance:   Bilateral Distance:    Right Eye Near:   Left Eye Near:    Bilateral Near:     Physical Exam Vitals and nursing note reviewed.  Constitutional:      General: She is not in acute distress.    Appearance: Normal appearance. She is not ill-appearing or toxic-appearing.  HENT:     Head: Normocephalic and atraumatic.     Right Ear: Tympanic membrane, ear canal and external ear normal. No drainage, swelling or tenderness. No middle ear effusion. Tympanic membrane is not erythematous.     Left Ear: Tympanic membrane, ear canal and external ear normal. No drainage, swelling or tenderness.  No middle ear effusion. Tympanic membrane is not erythematous.     Nose: Rhinorrhea present. No congestion.     Mouth/Throat:     Mouth: Mucous membranes are moist.     Pharynx: Oropharynx is clear. No oropharyngeal exudate or posterior oropharyngeal erythema.     Tonsils: No tonsillar exudate.  Eyes:     General: No scleral icterus.    Extraocular  Movements: Extraocular movements intact.  Cardiovascular:     Rate and Rhythm: Normal rate and regular rhythm.  Pulmonary:     Effort: Pulmonary effort is normal. No respiratory distress.     Breath sounds: Normal breath sounds. No wheezing, rhonchi or rales.  Abdominal:     General: Abdomen is flat. Bowel sounds are normal. There is no distension.     Palpations: Abdomen is soft.  Musculoskeletal:     Cervical back: Normal range of motion and neck supple.  Lymphadenopathy:     Cervical: No cervical adenopathy.  Skin:    General: Skin is warm and dry.     Coloration: Skin is not jaundiced or pale.     Findings: No erythema or rash.  Neurological:     Mental Status: She is alert and oriented to person, place, and time.     Motor: No weakness.  Psychiatric:        Speech: Speech is rapid and pressured.        Behavior: Behavior is cooperative.      UC Treatments / Results  Labs (all labs ordered are listed, but only abnormal results are displayed) Labs Reviewed  SARS CORONAVIRUS 2 (TAT 6-24 HRS)  CULTURE, GROUP A STREP [facility])  POCT RAPID STREP A (OFFICE)    EKG   Radiology No results found.  Procedures Procedures (including critical care  time)  Medications Ordered in UC Medications - No data to display  Initial Impression / Assessment and Plan / UC Course  I have reviewed the triage vital signs and the nursing notes.  Pertinent labs & imaging results that were available during my care of the patient were reviewed by me and considered in my medical decision making (see chart for details).    Patient is well-appearing, normotensive, afebrile, not tachycardic, not tachypneic, oxygenating well on room air.  COVID-19 testing obtained.  Supportive care discussed.  Start cough suppressants.  Note given for work.  ER precautions and return precautions discussed.  The patient was given the opportunity to ask questions.  All questions answered to their satisfaction.  The  patient is in agreement to this plan.   Final Clinical Impressions(s) / UC Diagnoses   Final diagnoses:  Encounter for screening for COVID-19  Viral URI  Exposure to strep throat     Discharge Instructions      Your symptoms and exam findings are most consistent with a viral upper respiratory infection. These usually run their course in about 10 days.  If your symptoms last longer than 10 days without improvement, please follow up with your primary care provider.  If your symptoms, worsen, please go to the Emergency Room.    We have tested you today for strep throat (negative; throat culture pending), and COVID-19 .  You will see the results in Mychart and we will call you with positive results.    Please stay home and isolate until you are aware of the results.    Some things that can make you feel better are: - Increased rest - Increasing fluid with water/sugar free electrolytes - Acetaminophen and ibuprofen as needed for fever/pain.  - Salt water gargling, chloraseptic spray and throat lozenges - OTC guaifenesin (Mucinex).  - Saline sinus flushes or a neti pot.  - Humidifying the air. - Tessalon perles for dry cough as needed every 8 hours.     ED Prescriptions     Medication Sig Dispense Auth. Provider   benzonatate (TESSALON) 100 MG capsule  (Status: Discontinued) Take 1 capsule (100 mg total) by mouth 3 (three) times daily as needed for cough. Do not take with alcohol or while driving or operating heavy machinery 21 capsule Noemi Chapel A, NP   benzonatate (TESSALON) 100 MG capsule Take 1 capsule (100 mg total) by mouth 3 (three) times daily as needed for cough. Do not take with alcohol or while driving or operating heavy machinery 21 capsule Eulogio Bear, NP      PDMP not reviewed this encounter.   Eulogio Bear, NP 11/13/21 1734

## 2021-11-14 DIAGNOSIS — Z20822 Contact with and (suspected) exposure to covid-19: Secondary | ICD-10-CM | POA: Diagnosis not present

## 2021-11-14 LAB — SARS CORONAVIRUS 2 (TAT 6-24 HRS): SARS Coronavirus 2: NEGATIVE

## 2021-11-16 LAB — CULTURE, GROUP A STREP (THRC)

## 2021-12-24 ENCOUNTER — Ambulatory Visit: Payer: Medicaid Other | Admitting: Family Medicine

## 2021-12-24 NOTE — Progress Notes (Deleted)
    SUBJECTIVE:   CHIEF COMPLAINT / HPI:   Mackenzie Gibson is a 36 y.o. female who presents to the [facility] clinic today to discuss the following concerns:   Abdominal Pain   PERTINENT  PMH / PSH: ***  OBJECTIVE:   LMP 09/29/2019  ***  General: NAD, pleasant, able to participate in exam Cardiac: RRR, no murmurs. Respiratory: CTAB, normal effort, No wheezes, rales or rhonchi Abdomen: Bowel sounds present, nontender, nondistended, no hepatosplenomegaly. Extremities: no edema or cyanosis. Skin: warm and dry, no rashes noted Neuro: alert, no obvious focal deficits Psych: Normal affect and mood  ASSESSMENT/PLAN:   No problem-specific Assessment & Plan notes found for this encounter.     Sharion Settler, [place]

## 2021-12-31 DIAGNOSIS — R070 Pain in throat: Secondary | ICD-10-CM | POA: Diagnosis not present

## 2022-02-06 DIAGNOSIS — J029 Acute pharyngitis, unspecified: Secondary | ICD-10-CM | POA: Diagnosis not present

## 2022-02-06 DIAGNOSIS — J0100 Acute maxillary sinusitis, unspecified: Secondary | ICD-10-CM | POA: Diagnosis not present

## 2022-02-06 DIAGNOSIS — J209 Acute bronchitis, unspecified: Secondary | ICD-10-CM | POA: Diagnosis not present

## 2022-02-12 ENCOUNTER — Ambulatory Visit (INDEPENDENT_AMBULATORY_CARE_PROVIDER_SITE_OTHER): Payer: Medicaid Other | Admitting: Student

## 2022-02-12 ENCOUNTER — Encounter: Payer: Self-pay | Admitting: Student

## 2022-02-12 VITALS — BP 139/79 | HR 85 | Ht 67.0 in | Wt 291.0 lb

## 2022-02-12 DIAGNOSIS — R002 Palpitations: Secondary | ICD-10-CM | POA: Diagnosis not present

## 2022-02-12 DIAGNOSIS — E559 Vitamin D deficiency, unspecified: Secondary | ICD-10-CM | POA: Diagnosis not present

## 2022-02-12 DIAGNOSIS — R051 Acute cough: Secondary | ICD-10-CM | POA: Insufficient documentation

## 2022-02-12 DIAGNOSIS — F419 Anxiety disorder, unspecified: Secondary | ICD-10-CM

## 2022-02-12 DIAGNOSIS — K5909 Other constipation: Secondary | ICD-10-CM

## 2022-02-12 DIAGNOSIS — K5900 Constipation, unspecified: Secondary | ICD-10-CM

## 2022-02-12 MED ORDER — FLUTICASONE PROPIONATE 50 MCG/ACT NA SUSP: 2.0000 | Freq: Every day | NASAL | 6 refills | Status: AC

## 2022-02-12 MED ORDER — SENNA 8.6 MG PO TABS: 1.0000 | ORAL_TABLET | Freq: Every day | ORAL | 0 refills | Status: AC

## 2022-02-12 NOTE — Assessment & Plan Note (Signed)
Assess on next visit in 2 weeks.

## 2022-02-12 NOTE — Assessment & Plan Note (Signed)
Hemodynamically stable in the room without any signs of respiratory compromise or acute chest pain.  Given her reported symptoms we will continue with CBC to assess for anemia and TSH for thyroid disease.  Discussed with the patient that at next visit, we will need an EKG to assess and she will possibly need a continuous monitor in the future to assess for arrhythmia if indicated.
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
## 2022-02-12 NOTE — Patient Instructions (Addendum)
It was great to see you today! Thank you for choosing [facility] for your primary care. Mackenzie Gibson was seen for follow up.  Cough  You can increase your Flonase to 2 pumps in each nare daily Use your albuterol as needed every 4 hours Please follow up with Dr. Valentina Lucks   For your constipation  Please continue with Senna daily  Continue with a diary to monitor bowel movements   For you heart palpitations  I am ordering some labs  I need you to return for an EKG   I will follow up on the rest of your concerns in 1 month    If you haven't already, sign up for My Chart to have easy access to your labs results, and communication with your primary care physician.  We are checking some labs today. If they are abnormal, I will call you. If they are normal, I will send you a MyChart message (if it is active) or a letter in the mail. If you do not hear about your labs in the next 2 weeks, please call the office. I recommend that you always bring your medications to each appointment as this makes it easy to ensure you are on the correct medications and helps [name] not miss refills when you need them. Call the clinic at [phone] if your symptoms worsen or you have any concerns.  You should return to our clinic Return in about 4 weeks (around 03/12/2022) for multiple concerns  . Please arrive 15 minutes before your appointment to ensure smooth check in process.  We appreciate your efforts in making this happen.  Thank you for allowing me to participate in your care, Erskine Emery, MD 02/12/2022, 11:28 AM PGY-2, [place]

## 2022-02-12 NOTE — Assessment & Plan Note (Signed)
Senna to pharmacy, will assess at next visit. Patient has concern for hemorrhoids.

## 2022-02-12 NOTE — Assessment & Plan Note (Signed)
No need for more steroids, not indicated currently. Completed cefdinir from UC. Increase Flonase to 2 puffs in nares daily. PFTs with [facility] for formal testing given smoking history. Overall, improving symptomatically.

## 2022-02-13 ENCOUNTER — Telehealth: Payer: Self-pay

## 2022-02-13 LAB — CBC
Hematocrit: 44.8 % (ref 34.0–46.6)
Hemoglobin: 14.8 g/dL (ref 11.1–15.9)
MCH: 28.7 pg (ref 26.6–33.0)
MCHC: 33 g/dL (ref 31.5–35.7)
MCV: 87 fL (ref 79–97)
Platelets: 250 10*3/uL (ref 150–450)
RBC: 5.16 x10E6/uL (ref 3.77–5.28)
RDW: 13 % (ref 11.7–15.4)
WBC: 15.3 10*3/uL — ABNORMAL HIGH (ref 3.4–10.8)

## 2022-02-13 LAB — TSH RFX ON ABNORMAL TO FREE T4: TSH: 4.3 u[IU]/mL (ref 0.450–4.500)

## 2022-02-13 LAB — VITAMIN D 25 HYDROXY (VIT D DEFICIENCY, FRACTURES): Vit D, 25-Hydroxy: 21.8 ng/mL — ABNORMAL LOW (ref 30.0–100.0)

## 2022-02-13 NOTE — Telephone Encounter (Signed)
Patient calls nurse line requesting to speak with PCP about recent labs.   She reports she saw the results on mychart. She reports she is most concerned with white count.   Will forward to PCP.

## 2022-02-14 ENCOUNTER — Ambulatory Visit (INDEPENDENT_AMBULATORY_CARE_PROVIDER_SITE_OTHER): Payer: Medicaid Other | Admitting: Pharmacist

## 2022-02-14 ENCOUNTER — Encounter: Payer: Self-pay | Admitting: Pharmacist

## 2022-02-14 VITALS — Ht 68.5 in | Wt 297.0 lb

## 2022-02-14 DIAGNOSIS — J4489 Other specified chronic obstructive pulmonary disease: Secondary | ICD-10-CM | POA: Diagnosis not present

## 2022-02-14 DIAGNOSIS — Z720 Tobacco use: Secondary | ICD-10-CM

## 2022-02-14 NOTE — Assessment & Plan Note (Signed)
Patient has been experiencing bronchitis/cough for ~10 days and taking albuterol once daily. Medication adherence reported. Spirometry evaluation reveals borderline Mild obstructive lung disease. -no change to inhaler regimen. Obstruction may be related to congestion and mucus. Can consider repeat PFT after cough and mucus have completely resolved.  - Recommended patient use OTC Robitussin (guaifenesin) to help with congestion.  - Reviewed results of pulmonary function tests.

## 2022-02-14 NOTE — Patient Instructions (Addendum)
It was nice talking to you today!  You can pick up Robatussin (guaifenesin) to help with mucus and congestion. Also drinking plenty of water should help.   Try to work on cutting down on the number of cigarettes you smoke every day.

## 2022-02-14 NOTE — Progress Notes (Signed)
S:    Chief Complaint  Patient presents with   Medication Management    PFT, Smoking Cessation   Mackenzie Gibson is a 36 y.o. female who presents for lung function evaluation.  PMH is significant for PCOS, panic disorder, tobacco use disorder, bipolar disorder, and migraines.  Patient was referred and last seen by Primary Care Provider, Dr. Zigmund Daniel, on 02/12/2022.  At last visit, Flonase (fluticasone) nasal spray was increased to 2 puffs in nares daily.   Patient reports breathing has been ok. Endorsees cough, sore throat, runny nose and congestion since 02/04/2022. Has completed both steroids and antibiotics without much relief. Patient denies atopic sx consistent with (atopic dermatitis, allergic rhinitis). Denies a history of asthma, aunt has COPD (not on oxygen). Current smoker.   Age when started using tobacco on a daily basis: 36 YO. Brand smoked Marlboro [employer]. Number of cigarettes/day: 16-20.  Estimated nicotine content per cigarette: 1 mg.  Estimated nicotine intake per day: 16-20 mg.  Smokes 1 time per night, around 3-4AM.    Fagerstrom Score Question Scoring Patient Score  How soon after waking do you smoke your first cigarette? <5 mins (3) 5-30 mins (2) 31-60 mins (1) >60 mins (0) 3  Do you find it difficult NOT to smoke in places where you shouldn't? Yes(1) No (0) 0  Which cigarette would you most hate to give up? First one in AM (1) Any other one (0) 1   How many cigarettes do you smoke/day? 10 or less (0) 11-20 (1) 21-30 (2) >30 (3) 1  Do you smoke more during the first few hours after waking? Yes (1) No (0) 1  Do you smoke if you are so ill you cannot get out of bed? Yes (1) No (0) 0    Total Score   6  Score interpretation: low 1-2, low-to-moderate 3-4, moderate 5-7, high >7  Most recent quit attempt never, continued to smoke through 3/4 pregnancies Longest time ever been tobacco free 1 day.  Medications used in past cessation efforts include:  none  Rates IMPORTANCE of quitting tobacco on 1-10 scale of 8.  Most common triggers to use tobacco include; stress, anxiety, all of her family smokes.    Patient reports adherence to medications Patient reports last dose of asthma medications was ~24 hours ago.  Current asthma medications: albuterol PRN Rescue inhaler use frequency: once/day  Level of asthma sx control- in the last 4 weeks: Question Scoring Patient Score  Daytime sx > 2x/week Yes (1)   No (0) 0  Any nighttime waking due to asthma Yes (1)   No (0) 0  Reliever needed >2x/week Yes (1)   No (0) 1  Any activity limitation due to asthma Yes (1)   No (0) 0   Total Score 1  Well controlled - 0, Partly controlled - 1-2, Uncontrolled 3-4   O: Review of Systems  Respiratory:  Positive for cough and sputum production.    Physical Exam Constitutional:      Appearance: Normal appearance.  Pulmonary:     Effort: Pulmonary effort is normal.  Neurological:     Mental Status: She is alert.  Psychiatric:        Mood and Affect: Mood normal.        Behavior: Behavior normal.    See "scanned report" or Documentation Flowsheet (discrete results - PFTs) for  Spirometry results. Patient provided good effort while attempting spirometry.   Lung Age = 46  A/P:  Patient has been experiencing bronchitis/cough for ~10 days and taking albuterol once daily. Medication adherence reported. Spirometry evaluation reveals borderline Mild obstructive lung disease. -no change to inhaler regimen. Obstruction may be related to congestion and mucus. Can consider repeat PFT after cough and mucus have completely resolved.  - Recommended patient use OTC Robitussin (guaifenesin) to help with congestion.  - Reviewed results of pulmonary function tests.   Chronic tobacco abuse since age 25.  Unwilling to consider plan for complete cessation at this time.  - Patient deferred pharmacotherapy to help with tobacco intake reduction/cessation.  -  Amenable to setting a goal of reducing to 10 cigarettes/day in the next 1-2 months.   Patient verbalized understanding of treatment plan.   Vitamin D deficiency: patient would like to discuss results from PCP visit and request a prescription for cholecalciferol be sent. PCP not in building today, will route chart.   Written patient instructions provided.  Total time in face to face counseling 35 minutes.    Follow-up:  Pharmacist PRN. PCP clinic visit 03/06/2022.  Patient seen with Joseph Art, PharmD, PGY2 Pharmacy Resident.

## 2022-02-14 NOTE — Assessment & Plan Note (Signed)
Chronic tobacco abuse since age 36.  Unwilling to consider plan for complete cessation at this time.  - Patient deferred pharmacotherapy to help with tobacco intake reduction/cessation.  - Amenable to setting a goal of reducing to 10 cigarettes/day in the next 1-2 months.   Patient verbalized understanding of treatment plan.

## 2022-02-18 ENCOUNTER — Other Ambulatory Visit: Payer: Self-pay | Admitting: Student

## 2022-02-18 DIAGNOSIS — E559 Vitamin D deficiency, unspecified: Secondary | ICD-10-CM

## 2022-02-18 MED ORDER — VITAMIN D (ERGOCALCIFEROL) 1.25 MG (50000 UNIT) PO CAPS: 50000.0000 [IU] | ORAL_CAPSULE | ORAL | 0 refills | Status: DC

## 2022-02-21 NOTE — Progress Notes (Signed)
Reviewed: I agree with Dr. Koval's documentation and management. 

## 2022-03-06 ENCOUNTER — Ambulatory Visit (INDEPENDENT_AMBULATORY_CARE_PROVIDER_SITE_OTHER): Payer: Medicaid Other | Admitting: Student

## 2022-03-06 ENCOUNTER — Encounter: Payer: Self-pay | Admitting: Student

## 2022-03-06 VITALS — BP 128/85 | HR 86 | Ht 68.5 in | Wt 295.6 lb

## 2022-03-06 DIAGNOSIS — L732 Hidradenitis suppurativa: Secondary | ICD-10-CM

## 2022-03-06 DIAGNOSIS — Z720 Tobacco use: Secondary | ICD-10-CM

## 2022-03-06 DIAGNOSIS — I159 Secondary hypertension, unspecified: Secondary | ICD-10-CM | POA: Diagnosis not present

## 2022-03-06 DIAGNOSIS — K5909 Other constipation: Secondary | ICD-10-CM | POA: Diagnosis not present

## 2022-03-06 DIAGNOSIS — F419 Anxiety disorder, unspecified: Secondary | ICD-10-CM

## 2022-03-06 DIAGNOSIS — R1011 Right upper quadrant pain: Secondary | ICD-10-CM | POA: Insufficient documentation

## 2022-03-06 DIAGNOSIS — E282 Polycystic ovarian syndrome: Secondary | ICD-10-CM | POA: Diagnosis not present

## 2022-03-06 MED ORDER — DOXYCYCLINE HYCLATE 50 MG PO CAPS: 50.0000 mg | ORAL_CAPSULE | Freq: Two times a day (BID) | ORAL | 2 refills | Status: AC

## 2022-03-06 NOTE — Progress Notes (Signed)
SUBJECTIVE:   CHIEF COMPLAINT / HPI:   Presenting for f/u multiple concerns with significant anxiety.  Her cold has almost completely resolved  Residual cough with no further symptoms   She reports that she still has bowel concerns with constipation, straining significantly with bowel movements with internal hemorrhoids.   She has been unable to obtain the Senna that was ordered because of financial strain over the holidays but reports that she can get it now.   Anxiety is significant with a strong history of this. However, she has not wanted medications in the past. Reports of a history of bipolar disorder but denies manic episodes. Would feel better with referral to psychiatry.   She feels that her mood is worsening due to the environment as her kids have been disrespectful to her and have been out of school for the holidays. She is also a single mom, and this is causing strain as well.  Patient reports that her hidradenitis suppurativa has been worsening lately.  She would like an antibiotic for an outbreak, but she does not like swallowing the doxycycline.  She would like further discussion on her options.  The palpitations that she reported originally have completely resolved.  PERTINENT  PMH / PSH: Anxiety, arthritis, bipolar disorder, depression, constipation, swallowing concerns, morbid obesity     OBJECTIVE:  BP 128/85   Pulse 86   Ht 5' 8.5" (1.74 m)   Wt 295 lb 9.6 oz (134.1 kg)   LMP 09/29/2019   SpO2 100%   BMI 44.29 kg/m  Physical Exam Vitals reviewed.  Constitutional:      Appearance: Normal appearance.  HENT:     Head: Normocephalic.     Nose: Nose normal.     Mouth/Throat:     Mouth: Mucous membranes are moist.  Eyes:     Conjunctiva/sclera: Conjunctivae normal.  Cardiovascular:     Rate and Rhythm: Normal rate and regular rhythm.  Pulmonary:     Effort: Pulmonary effort is normal.     Breath sounds: Normal breath sounds.  Abdominal:     General:  Bowel sounds are normal. There is no distension.     Palpations: Abdomen is soft. There is no mass.     Tenderness: There is abdominal tenderness (RUQ).  Musculoskeletal:     Cervical back: Normal range of motion.  Skin:    Capillary Refill: Capillary refill takes less than 2 seconds.  Neurological:     General: No focal deficit present.     Mental Status: She is alert.  Psychiatric:        Mood and Affect: Mood normal.        Behavior: Behavior normal.      ASSESSMENT/PLAN:  Anxious mood Assessment & Plan: With bipolar and panic disorder diagnosis per chart. Per patient preference, sent referral to psychiatry. I feel patient would greatly benefit from therapy and medication.   Orders: -     Ambulatory referral to Psychiatry  Right upper quadrant pain Assessment & Plan: Patient hemodynamically stable and otherwise well-appearing with a nonacute abdomen.  Will continue with CMet to evaluate LFTs. Will continue with a broad ddx. Most likely related to constipation given presentation.   Orders: -     Comprehensive metabolic panel  Morbid obesity ([place]) Assessment & Plan: Patient had previously been at healthy weight and wellness and is requesting a new referral. Referral placed.   Orders: -     Amb Ref to Medical Weight Management  h/o Secondary hypertension (  w/ pregnancy) Assessment & Plan: No elevation in BP today    Hidradenitis suppurativa Assessment & Plan: Needs doxycycline for worsening symptoms. Rx provided after discussion of options other than doxy.   Orders: -     Doxycycline Hyclate; Take 1 capsule (50 mg total) by mouth 2 (two) times daily.  Dispense: 60 capsule; Refill: 2  Chronic constipation Assessment & Plan: Continue with senna and MiraLAX regularly, patient seems to be straining with bowel movements.  Increase fiber in diet, will continue discussion of nutrition at next visit.     Return in about 2 months (around 05/05/2022). Erskine Emery,  MD 03/07/2022, 7:33 AM PGY-2, [place]

## 2022-03-06 NOTE — Patient Instructions (Addendum)
It was great to see you today! Thank you for choosing [facility] for your primary care. Mackenzie Gibson was seen for follow up.  Today we addressed: -Continuing with lab work for your stomach pain  -I have ordered a referral to the psychiatrist -We have discussed your options for HS treatment  -Please return to your dermatologist  [facility] [address] [place], [place] [address] [phone] If you haven't already, sign up for My Chart to have easy access to your labs results, and communication with your primary care physician.  I recommend that you always bring your medications to each appointment as this makes it easy to ensure you are on the correct medications and helps [name] not miss refills when you need them. Call the clinic at [phone] if your symptoms worsen or you have any concerns.  You should return to our clinic Return in about 2 months (around 05/05/2022). Please arrive 15 minutes before your appointment to ensure smooth check in process.  We appreciate your efforts in making this happen.  Thank you for allowing me to participate in your care, Erskine Emery, MD 03/06/2022, 10:55 AM PGY-2, [place]

## 2022-03-06 NOTE — Assessment & Plan Note (Signed)
Needs doxycycline for worsening symptoms. Rx provided after discussion of options other than doxy.

## 2022-03-06 NOTE — Assessment & Plan Note (Signed)
No elevation in BP today

## 2022-03-06 NOTE — Assessment & Plan Note (Signed)
With bipolar disorder diagnosis. Per patient preference, sent referral to psychiatry.

## 2022-03-07 ENCOUNTER — Encounter: Payer: Self-pay | Admitting: Student

## 2022-03-07 LAB — COMPREHENSIVE METABOLIC PANEL
ALT: 15 IU/L (ref 0–32)
AST: 15 IU/L (ref 0–40)
Albumin/Globulin Ratio: 1.7 (ref 1.2–2.2)
Albumin: 4.2 g/dL (ref 3.9–4.9)
Alkaline Phosphatase: 68 IU/L (ref 44–121)
BUN/Creatinine Ratio: 15 (ref 9–23)
BUN: 11 mg/dL (ref 6–20)
Bilirubin Total: 0.3 mg/dL (ref 0.0–1.2)
CO2: 24 mmol/L (ref 20–29)
Calcium: 9.1 mg/dL (ref 8.7–10.2)
Chloride: 102 mmol/L (ref 96–106)
Creatinine, Ser: 0.72 mg/dL (ref 0.57–1.00)
Globulin, Total: 2.5 g/dL (ref 1.5–4.5)
Glucose: 80 mg/dL (ref 70–99)
Potassium: 4.6 mmol/L (ref 3.5–5.2)
Sodium: 138 mmol/L (ref 134–144)
Total Protein: 6.7 g/dL (ref 6.0–8.5)
eGFR: 110 mL/min/{1.73_m2} (ref >59)

## 2022-03-07 NOTE — Assessment & Plan Note (Signed)
Patient hemodynamically stable and otherwise well-appearing with a nonacute abdomen.  Will continue with CMet to evaluate LFTs. Will continue with a broad ddx. Most likely related to constipation given presentation.

## 2022-03-07 NOTE — Assessment & Plan Note (Signed)
Continue with senna and MiraLAX regularly, patient seems to be straining with bowel movements.  Increase fiber in diet, will continue discussion of nutrition at next visit.

## 2022-03-07 NOTE — Assessment & Plan Note (Signed)
Patient had previously been at healthy weight and wellness and is requesting a new referral. Referral placed.

## 2022-03-14 ENCOUNTER — Ambulatory Visit (INDEPENDENT_AMBULATORY_CARE_PROVIDER_SITE_OTHER): Payer: Medicaid Other | Admitting: Internal Medicine

## 2022-03-14 ENCOUNTER — Encounter (INDEPENDENT_AMBULATORY_CARE_PROVIDER_SITE_OTHER): Payer: Self-pay | Admitting: Internal Medicine

## 2022-03-14 VITALS — BP 132/84 | HR 81 | Temp 98.1°F | Ht 68.0 in | Wt 287.0 lb

## 2022-03-14 DIAGNOSIS — E88819 Insulin resistance, unspecified: Secondary | ICD-10-CM | POA: Diagnosis not present

## 2022-03-14 DIAGNOSIS — E559 Vitamin D deficiency, unspecified: Secondary | ICD-10-CM | POA: Diagnosis not present

## 2022-03-14 DIAGNOSIS — E282 Polycystic ovarian syndrome: Secondary | ICD-10-CM

## 2022-03-14 DIAGNOSIS — Z6841 Body Mass Index (BMI) 40.0 and over, adult: Secondary | ICD-10-CM | POA: Diagnosis not present

## 2022-03-14 NOTE — Progress Notes (Signed)
Office: [phone]  /  Fax: [phone]   Initial Visit  Mackenzie Gibson was seen in clinic today to evaluate for obesity. She is interested in losing weight to improve overall health and reduce the risk of weight related complications. She presents today to review program treatment options, initial physical assessment, and evaluation.  Patient expresses interest in gastric bypass but wants to give medical therapy a try.  She is had multiple family members who have had successful surgery.  She was referred by: Self-Referral  When asked what else they would like to accomplish? She states: Improve energy levels and physical activity, Improve quality of life, and Lose a target amount of weight : 160  When asked how has your weight affected you? She states: Contributed to medical problems, Having fatigue, Having poor endurance, Problems with eating patterns, and Problems with depression and or anxiety  Some associated conditions: Hyperlipidemia, Vitamin D Deficiency, and Other: insulin resistance  Contributing factors: Family history, Nutritional, Stress, Reduced physical activity, Eating patterns, Mental health problems, and Pregnancy  Weight promoting medications identified: Other: antibiotics for hydradenitis  Current nutrition plan: Portion control / smart choices  Current level of physical activity: Walking and NEAT  Current or previous pharmacotherapy: Metformin  Response to medication: Other: stopped using   Past medical history includes:   Past Medical History:  Diagnosis Date   Anxiety    Arthritis    Back pain    Bipolar disorder ([place])    Bipolar disorder ([place])    Chronic hypertension in pregnancy 03/02/2015   Constipation    Depression    Family history of adverse reaction to anesthesia    mom "woke up during" surgery    Gallbladder problem    High blood pressure    HPV (human papilloma virus) infection    Joint pain    Morbid obesity ([facility])    PCOS  (polycystic ovarian syndrome)    PONV (postoperative nausea and vomiting)    Pregnancy induced hypertension    SOBOE (shortness of breath on exertion)    Swallowing difficulty      Objective:   BP 132/84   Pulse 81   Temp (!) 81 F (27.2 C)   Ht '5\' 8"'$  (1.727 m)   Wt 287 lb (130.2 kg)   LMP 09/29/2019   SpO2 98%   BMI 43.64 kg/m  She was weighed on the bioimpedance scale: Body mass index is 43.64 kg/m.  Peak Weight:306 , Body Fat%:49, Visceral Fat Rating:14, Weight trend over the last 12 months: Increasing  General:  Alert, oriented and cooperative. Patient is in no acute distress.  Respiratory: Normal respiratory effort, no problems with respiration noted  Extremities: Normal range of motion.    Mental Status: Normal mood and affect. Normal behavior. Normal judgment and thought content.   Assessment and Plan:    1. Class 3 severe obesity with serious comorbidity and body mass index (BMI) of 40.0 to 44.9 in adult, unspecified obesity type ([place]) We reviewed weight, biometrics, associated medical conditions and contributing factors with patient. She would benefit from weight loss therapy via a modified calorie, low-carb, high-protein nutritional plan tailored to their REE (resting energy expenditure) which will be determined by indirect calorimetry.  We will also assess for cardiometabolic risk and nutritional derangements via fasting serologies at her next appointment.  2. Vitamin D deficiency Most recent vitamin D levels were 21.8.  Associated with excess adiposity and may result in leptin resistance and adipogenesis.  She is currently  on vitamin D supplementation.  She will continue.  Recommend checking levels at 4 to 6 months for goal of 50-60.  3. Insulin resistance I reviewed labs from 2022 she had an insulin level of 23.5.  This is associated with increased cravings particularly for carbs and fatigue.  Weight loss 7 to 10% of body weight will improve condition.  4. PCOS  (polycystic ovarian syndrome) Independently associated with obesity and also insulin resistance.  She may benefit from metformin we will consider pharmacotherapy in the future.     Obesity Treatment / Action Plan:  Patient will work on garnering support from family and friends to begin weight loss journey. Will work on eliminating or reducing the presence of highly palatable, calorie dense foods in the home. Will complete provided nutritional and psychosocial assessment questionnaire before the next appointment. Will be scheduled for indirect calorimetry to determine resting energy expenditure in a fasting state.  This will allow [name] to create a reduced calorie, high-protein meal plan to promote loss of fat mass while preserving muscle mass. Counseled on the health benefits of losing 5%-15% of total body weight. Was counseled on nutritional approaches to weight loss and benefits of complex carbs and high quality protein as part of nutritional weight management. Was counseled on pharmacotherapy and role as an adjunct in weight management.   Obesity Education Performed Today:  She was weighed on the bioimpedance scale and results were discussed and documented in the synopsis.  We discussed obesity as a disease and the importance of a more detailed evaluation of all the factors contributing to the disease.  We discussed the importance of long term lifestyle changes which include nutrition, exercise and behavioral modifications as well as the importance of customizing this to her specific health and social needs.  We discussed the benefits of reaching a healthier weight to alleviate the symptoms of existing conditions and reduce the risks of the biomechanical, metabolic and psychological effects of obesity.  Mackenzie Gibson appears to be in the action stage of change and states they are ready to start intensive lifestyle modifications and behavioral modifications.  30 minutes was spent today  on this visit including the above counseling, pre-visit chart review, and post-visit documentation.  Reviewed by clinician on day of visit: allergies, medications, problem list, medical history, surgical history, family history, social history, and previous encounter notes.    I have reviewed the above documentation for accuracy and completeness, and I agree with the above.  Thomes Dinning, MD

## 2022-03-29 DIAGNOSIS — R197 Diarrhea, unspecified: Secondary | ICD-10-CM | POA: Diagnosis not present

## 2022-03-29 DIAGNOSIS — E88819 Insulin resistance, unspecified: Secondary | ICD-10-CM | POA: Diagnosis not present

## 2022-03-29 DIAGNOSIS — R509 Fever, unspecified: Secondary | ICD-10-CM | POA: Diagnosis not present

## 2022-03-29 DIAGNOSIS — R112 Nausea with vomiting, unspecified: Secondary | ICD-10-CM | POA: Diagnosis not present

## 2022-04-04 DIAGNOSIS — S99921A Unspecified injury of right foot, initial encounter: Secondary | ICD-10-CM | POA: Diagnosis not present

## 2022-04-04 DIAGNOSIS — R52 Pain, unspecified: Secondary | ICD-10-CM | POA: Diagnosis not present

## 2022-04-04 DIAGNOSIS — Z23 Encounter for immunization: Secondary | ICD-10-CM | POA: Diagnosis not present

## 2022-04-04 DIAGNOSIS — S91331A Puncture wound without foreign body, right foot, initial encounter: Secondary | ICD-10-CM | POA: Diagnosis not present

## 2022-04-06 ENCOUNTER — Other Ambulatory Visit: Payer: Self-pay | Admitting: Student

## 2022-04-06 DIAGNOSIS — E559 Vitamin D deficiency, unspecified: Secondary | ICD-10-CM

## 2022-04-16 ENCOUNTER — Encounter (INDEPENDENT_AMBULATORY_CARE_PROVIDER_SITE_OTHER): Payer: Self-pay | Admitting: Internal Medicine

## 2022-04-16 ENCOUNTER — Ambulatory Visit (INDEPENDENT_AMBULATORY_CARE_PROVIDER_SITE_OTHER): Payer: Medicaid Other | Admitting: Internal Medicine

## 2022-04-16 VITALS — BP 130/86 | HR 84 | Temp 97.8°F | Ht 68.0 in | Wt 294.0 lb

## 2022-04-16 DIAGNOSIS — Z1331 Encounter for screening for depression: Secondary | ICD-10-CM

## 2022-04-16 DIAGNOSIS — E782 Mixed hyperlipidemia: Secondary | ICD-10-CM | POA: Diagnosis not present

## 2022-04-16 DIAGNOSIS — E559 Vitamin D deficiency, unspecified: Secondary | ICD-10-CM

## 2022-04-16 DIAGNOSIS — R0602 Shortness of breath: Secondary | ICD-10-CM | POA: Diagnosis not present

## 2022-04-16 DIAGNOSIS — E88819 Insulin resistance, unspecified: Secondary | ICD-10-CM | POA: Diagnosis not present

## 2022-04-16 DIAGNOSIS — Z6841 Body Mass Index (BMI) 40.0 and over, adult: Secondary | ICD-10-CM

## 2022-04-16 DIAGNOSIS — R5383 Other fatigue: Secondary | ICD-10-CM

## 2022-04-16 NOTE — Progress Notes (Unsigned)
Chief Complaint:   OBESITY Mackenzie Gibson (MR# [id number]) is a 37 y.o. female who presents for evaluation and treatment of obesity and related comorbidities. Current BMI is Body mass index is 44.7 kg/m. Mackenzie Gibson has been struggling with her weight for many years and has been unsuccessful in either losing weight, maintaining weight loss, or reaching her healthy weight goal.  Mackenzie Gibson is currently in the action stage of change and ready to dedicate time achieving and maintaining a healthier weight. Mackenzie Gibson is interested in becoming our patient and working on intensive lifestyle modifications including (but not limited to) diet and exercise for weight loss.  Mackenzie Gibson's habits were reviewed today and are as follows: Her family eats meals together, she thinks her family will eat healthier with her, her desired weight loss is 114 lbs, she has been heavy most of her life, she started gaining weight after her 4th child, her heaviest weight ever was 306 pounds, she has significant food cravings issues, she snacks frequently in the evenings, she skips meals frequently, she is frequently drinking liquids with calories, she frequently makes poor food choices, she frequently eats larger portions than normal, and she struggles with emotional eating.  Depression Screen Mackenzie Gibson's Food and Mood (modified PHQ-9) score was 20.  Subjective:   1. Other fatigue Mackenzie Gibson admits to daytime somnolence and admits to waking up still tired. Patient has a history of symptoms of daytime fatigue and morning fatigue. Mackenzie Gibson generally gets 7 or 8 hours of sleep per night, and states that she has nightime awakenings and generally restful sleep. Snoring is present. Apneic episodes are not present. Epworth Sleepiness Score is 10.   2. SOB (shortness of breath) on exertion Mackenzie Gibson notes increasing shortness of breath with exercising and seems to be worsening over time with weight gain. She notes getting out of breath  sooner with activity than she used to. This has not gotten worse recently. Mackenzie Gibson denies shortness of breath at rest or orthopnea.  3. Vitamin D deficiency Deficiency state associated with adiposity and may result in leptin resistance, weight gain and fatigue. Currently on vitamin D supplementation without any adverse effects.  Most recent vitamin D levels  Lab Results  Component Value Date   VD25OH 21.8 (L) 02/12/2022   VD25OH 27.3 (L) 02/15/2021   VD25OH 27.6 (L) 08/24/2020    4. Insulin resistance History of elevated insulin levels.  We will check fasting blood sugar and insulin levels today to calculate HOMA-IR score. This is complex condition associated with genetics, ectopic fat and lifestyle factors. Insulin resistance may result in weight gain, abnormal cravings (particularly for carbs) and fatigue. This may result in additional weight gain and lead to pre-diabetes and diabetes if untreated.   Lab Results  Component Value Date   HGBA1C 5.2 06/18/2021   Lab Results  Component Value Date   INSULIN 23.5 08/24/2020   Lab Results  Component Value Date   GLUCOSE 80 03/06/2022   GLUCOSE 102 (H) 08/24/2006   5. Mixed hyperlipidemia She has an elevated triglyceride and low HDL consistent with insulin resistance and metabolic syndrome.    Lab Results  Component Value Date   CHOL 137 06/18/2021   HDL 27 (L) 06/18/2021   LDLCALC 70 06/18/2021   TRIG 243 (H) 06/18/2021   CHOLHDL 5.1 (H) 06/18/2021   Assessment/Plan:   1. Other fatigue Mackenzie Gibson does feel that her weight is causing her energy to be lower than it should be. Fatigue may be related to  obesity, depression or many other causes. Labs will be ordered, and in the meanwhile, Mackenzie Gibson will focus on self care including making healthy food choices, increasing physical activity and focusing on stress reduction.  - EKG 12-Lead - Vitamin B12 - CBC with Differential/Platelet  2. SOB (shortness of breath) on  exertion Mackenzie Gibson does feel that she gets out of breath more easily that she used to when she exercises. Mackenzie Gibson's shortness of breath appears to be obesity related and exercise induced. She has agreed to work on weight loss and gradually increase exercise to treat her exercise induced shortness of breath. Will continue to monitor closely.  3. Vitamin D deficiency We will check labs today. She is been recently started on high-dose vitamin D recommend she continue for at least 3 to 4 months and check levels for goal of 50-60.  - VITAMIN D 25 Hydroxy (Vit-D Deficiency, Fractures)  4. Insulin resistance We will check labs today. I recommend ongoing weight loss therapy (10% of TBW), increasing physical activity, reducing processed, simple sugars and saturated fats in diet.  - Comprehensive metabolic panel - Hemoglobin A1c - Insulin, random  5. Mixed hyperlipidemia We will check fasting lipid panel today, counseled on reducing simple and added sugars from her diet.  - TSH - Lipid Panel With LDL/HDL Ratio  6. Depression screen Mackenzie Gibson had a positive depression screening. Depression is commonly associated with obesity and often results in emotional eating behaviors. We will monitor this closely and work on CBT to help improve the non-hunger eating patterns. Referral to Psychology may be required if no improvement is seen as she continues in our clinic.  7. Class 3 severe obesity with serious comorbidity and body mass index (BMI) of 40.0 to 44.9 in adult, unspecified obesity type ([place]) Mackenzie Gibson is currently in the action stage of change and her goal is to continue with weight loss efforts. I recommend Mackenzie Gibson begin the structured treatment plan as follows:  She has agreed to the Category 4 Plan.  Exercise goals: No exercise has been prescribed at this time.   Behavioral modification strategies: increasing lean protein intake, decreasing simple carbohydrates, increasing vegetables,  increasing water intake, increasing high fiber foods, decreasing eating out, no skipping meals, meal planning and cooking strategies, keeping healthy foods in the home, better snacking choices, avoiding temptations, planning for success, and decreasing junk food.  She was informed of the importance of frequent follow-up visits to maximize her success with intensive lifestyle modifications for her multiple health conditions. She was informed we would discuss her lab results at her next visit unless there is a critical issue that needs to be addressed sooner. Mackenzie Gibson agreed to keep her next visit at the agreed upon time to discuss these results.  Objective:   Blood pressure 130/86, pulse 84, temperature 97.8 F (36.6 C), height '5\' 8"'$  (1.727 m), weight 294 lb (133.4 kg), last menstrual period 09/29/2019, SpO2 98 %. Body mass index is 44.7 kg/m.  EKG: Normal sinus rhythm, rate 80 BPM.  Indirect Calorimeter completed today shows a VO2 of 393 and a REE of 2707.  Her calculated basal metabolic rate is 0000000 thus her basal metabolic rate is better than expected.  General: Cooperative, alert, well developed, in no acute distress. HEENT: Conjunctivae and lids unremarkable. Cardiovascular: Regular rhythm.  Lungs: Normal work of breathing. Neurologic: No focal deficits.   Lab Results  Component Value Date   CREATININE 0.72 03/06/2022   BUN 11 03/06/2022   NA 138 03/06/2022   K  4.6 03/06/2022   CL 102 03/06/2022   CO2 24 03/06/2022   Lab Results  Component Value Date   ALT 15 03/06/2022   AST 15 03/06/2022   ALKPHOS 68 03/06/2022   BILITOT 0.3 03/06/2022   Lab Results  Component Value Date   HGBA1C 5.2 06/18/2021   HGBA1C 5.1 02/15/2021   HGBA1C 5.0 08/24/2020   HGBA1C 4.8 05/29/2020   HGBA1C 5.1 11/24/2019   Lab Results  Component Value Date   INSULIN 23.5 08/24/2020   Lab Results  Component Value Date   TSH 4.300 02/12/2022   Lab Results  Component Value Date   CHOL 137  06/18/2021   HDL 27 (L) 06/18/2021   LDLCALC 70 06/18/2021   TRIG 243 (H) 06/18/2021   CHOLHDL 5.1 (H) 06/18/2021   Lab Results  Component Value Date   WBC 15.3 (H) 02/12/2022   HGB 14.8 02/12/2022   HCT 44.8 02/12/2022   MCV 87 02/12/2022   PLT 250 02/12/2022   No results found for: "IRON", "TIBC", "FERRITIN"  Attestation Statements:   Reviewed by clinician on day of visit: allergies, medications, problem list, medical history, surgical history, family history, social history, and previous encounter notes.  Time spent on visit including pre-visit chart review and post-visit charting and care was 40 minutes.   Wilhemena Durie, am acting as transcriptionist for Thomes Dinning, MD.  I have reviewed the above documentation for accuracy and completeness, and I agree with the above. -Thomes Dinning, MD

## 2022-04-16 NOTE — Assessment & Plan Note (Signed)
Most recent vitamin D levels  Lab Results  Component Value Date   VD25OH 21.8 (L) 02/12/2022   VD25OH 27.3 (L) 02/15/2021   VD25OH 27.6 (L) 08/24/2020     Deficiency state associated with adiposity and may result in leptin resistance, weight gain and fatigue. Currently on vitamin D supplementation without any adverse effects.  Plan: She is been recently started on high-dose vitamin D recommend she continue for at least 3 to 4 months and check levels for goal of 50-60.

## 2022-04-16 NOTE — Assessment & Plan Note (Signed)
She has an elevated triglyceride and low HDL consistent with insulin resistance and metabolic syndrome.    Lab Results  Component Value Date   CHOL 137 06/18/2021   HDL 27 (L) 06/18/2021   LDLCALC 70 06/18/2021   TRIG 243 (H) 06/18/2021   CHOLHDL 5.1 (H) 06/18/2021    We will check fasting lipid panel today, counseled on reducing simple and added sugars from her diet.

## 2022-04-16 NOTE — Assessment & Plan Note (Signed)
History of elevated insulin levels.  We will check fasting blood sugar and insulin levels today to calculate HOMA-IR score. This is complex condition associated with genetics, ectopic fat and lifestyle factors. Insulin resistance may result in weight gain, abnormal cravings (particularly for carbs) and fatigue. This may result in additional weight gain and lead to pre-diabetes and diabetes if untreated.   Lab Results  Component Value Date   HGBA1C 5.2 06/18/2021   Lab Results  Component Value Date   INSULIN 23.5 08/24/2020   Lab Results  Component Value Date   GLUCOSE 80 03/06/2022   GLUCOSE 102 (H) 08/24/2006    I recommend ongoing weight loss therapy (10% of TBW), increasing physical activity, reducing processed, simple sugars and saturated fats in diet.

## 2022-04-19 ENCOUNTER — Encounter (HOSPITAL_COMMUNITY): Payer: Self-pay | Admitting: Psychiatry

## 2022-04-19 ENCOUNTER — Ambulatory Visit (HOSPITAL_BASED_OUTPATIENT_CLINIC_OR_DEPARTMENT_OTHER): Payer: Medicaid Other | Admitting: Psychiatry

## 2022-04-19 DIAGNOSIS — F4310 Post-traumatic stress disorder, unspecified: Secondary | ICD-10-CM | POA: Insufficient documentation

## 2022-04-19 DIAGNOSIS — F411 Generalized anxiety disorder: Secondary | ICD-10-CM

## 2022-04-19 MED ORDER — HYDROXYZINE HCL 10 MG PO TABS: 10.0000 mg | ORAL_TABLET | Freq: Three times a day (TID) | ORAL | 1 refills | Status: AC | PRN

## 2022-04-19 MED ORDER — ESCITALOPRAM OXALATE 10 MG PO TABS: 10.0000 mg | ORAL_TABLET | Freq: Every day | ORAL | 2 refills | Status: DC
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
## 2022-04-30 ENCOUNTER — Encounter (INDEPENDENT_AMBULATORY_CARE_PROVIDER_SITE_OTHER): Payer: Self-pay | Admitting: Internal Medicine

## 2022-04-30 ENCOUNTER — Ambulatory Visit (INDEPENDENT_AMBULATORY_CARE_PROVIDER_SITE_OTHER): Payer: Medicaid Other | Admitting: Internal Medicine

## 2022-04-30 VITALS — BP 135/85 | HR 93 | Temp 97.8°F | Ht 68.0 in | Wt 299.0 lb

## 2022-04-30 DIAGNOSIS — R030 Elevated blood-pressure reading, without diagnosis of hypertension: Secondary | ICD-10-CM

## 2022-04-30 DIAGNOSIS — E559 Vitamin D deficiency, unspecified: Secondary | ICD-10-CM | POA: Diagnosis not present

## 2022-04-30 DIAGNOSIS — E88819 Insulin resistance, unspecified: Secondary | ICD-10-CM | POA: Diagnosis not present

## 2022-04-30 DIAGNOSIS — Z6841 Body Mass Index (BMI) 40.0 and over, adult: Secondary | ICD-10-CM

## 2022-04-30 DIAGNOSIS — E782 Mixed hyperlipidemia: Secondary | ICD-10-CM | POA: Diagnosis not present

## 2022-04-30 MED ORDER — METFORMIN HCL ER 500 MG PO TB24: 500.0000 mg | ORAL_TABLET | Freq: Every day | ORAL | 0 refills | Status: AC

## 2022-04-30 NOTE — Assessment & Plan Note (Signed)
I suspect she has hypertension.  She is declining medical therapy.  She does not have a blood pressure monitor.  I recommended that she buy one.  I would like for her to check her blood pressure in the morning and also before bedtime to look at trends.  We will consider starting pharmacotherapy if blood pressures are elevated again at the next visit.

## 2022-04-30 NOTE — Assessment & Plan Note (Signed)
On over-the-counter supplementation.  Recommend checking levels at 3 months for adherence and response.  Denies any side effects

## 2022-04-30 NOTE — Assessment & Plan Note (Signed)
She has a low HDL and elevated triglycerides consistent with metabolic syndrome and insulin resistance.  Lab Results  Component Value Date   CHOL 137 06/18/2021   HDL 27 (L) 06/18/2021   LDLCALC 70 06/18/2021   TRIG 243 (H) 06/18/2021   CHOLHDL 5.1 (H) 06/18/2021    Continue to work on nutritional strategies.  Reducing simple and added sugars should improve triglycerides.

## 2022-04-30 NOTE — Assessment & Plan Note (Signed)
She had elevated insulin levels in the past.  This is complex condition associated with genetics, ectopic fat and lifestyle factors. Insulin resistance may result in weight gain, abnormal cravings (particularly for carbs) and fatigue. This may result in additional weight gain and lead to pre-diabetes and diabetes if untreated.   Lab Results  Component Value Date   HGBA1C 5.2 06/18/2021   Lab Results  Component Value Date   INSULIN 23.5 08/24/2020   Lab Results  Component Value Date   GLUCOSE 80 03/06/2022   GLUCOSE 102 (H) 08/24/2006   We reviewed nutritional approaches to reduce insulin resistance.  In addition she will benefit from pharmacotherapy.  After discussion of benefits and side effects she is agreeable to starting metformin XR 500 mg 1 tablet daily.  We will check a fasting blood sugar and insulin levels at the next office visit.  These were not completed with intake labs reasons are unknown.  We are also checking a hemoglobin A1c.

## 2022-04-30 NOTE — Progress Notes (Signed)
Office: [phone]  /  Fax: [phone]  WEIGHT SUMMARY AND BIOMETRICS  Vitals Temp: 97.8 F (36.6 C) BP: 135/85 Pulse Rate: 93 SpO2: 97 %   Anthropometric Measurements Height: '5\' 8"'$  (1.727 m) Weight: 299 lb (135.6 kg) BMI (Calculated): 45.47 Weight at Last Visit: 294 lb Weight Lost Since Last Visit: +5 lb Starting Weight: 294 lb Total Weight Loss (lbs): 0 lb (0 kg) Peak Weight: 306   Body Composition  Body Fat %: 51.1 % Fat Mass (lbs): 152.8 lbs Muscle Mass (lbs): 138.8 lbs Total Body Water (lbs): 104.2 lbs Visceral Fat Rating : 15   Other Clinical Data Fasting: No Labs: No Today's Visit #: 2 Starting Date: 04/16/22    HPI  Chief Complaint: OBESITY  Mackenzie Gibson is here to discuss her progress with her obesity treatment plan. She is on the the Category 4 Plan and states she is following her eating plan approximately 99 % of the time. She states she is getting 500 to 1,000 steps 5 times per week.  Interval History:  Since last office visit she has gained 5 lbs. She reports good. adhrence to prescribed reduced calorie nutrition plan. Has been working on not skipping meals and eating more home prepared meals.  Reports problems with appetite and hunger signals.  Denies problems with satiety and satiation.  Denies problems with eating patterns and portion control.  Sleeping approximately 6 hours a day.  Sleep described as restorative.  Stress levels are reported as medium and due to Work [employer], school .  Barriers identified lack of time for self-care.  24 hour Recall Breakfast - hard boiled eggs piece of toast and cup coffee with powder creamer. Lunch - [place] sandwich, lettuce and tomato Dinner- Fish filet sandwich and water.   Pharmacotherapy for weight loss: She is currently taking no anti-obesity medication.   Weight promoting medications identified: None.  ASSESSMENT AND PLAN  TREATMENT PLAN FOR OBESITY:  Recommended Dietary  Goals  Mackenzie Gibson is currently in the action stage of change. As such, her goal is to continue weight management plan. She has agreed to the Category 4 Plan.  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  Because of discrepancy between adherence to reduced calorie nutrition plan and weight gain I would like for her to start journaling to identify sources of calories.  We programmed goals into her my fitness pal app.  Additional resources provided today: NA  Recommended Physical Activity Goals  Mackenzie Gibson has been advised to work up to 150 minutes of moderate intensity aerobic activity a week and strengthening exercises 2-3 times per week for cardiovascular health, weight loss maintenance and preservation of muscle mass.   Pharmacotherapy We discussed various medication options to help Mackenzie Gibson with her weight loss efforts and we both agreed to start metformin XR 500 mg 1 tablet daily.  We reviewed off label use, common medication side effects and benefits.  ASSOCIATED CONDITIONS ADDRESSED TODAY  Vitamin D deficiency Assessment & Plan: On over-the-counter supplementation.  Recommend checking levels at 3 months for adherence and response.  Denies any side effects  Orders: -     Vitamin B12  Mixed hyperlipidemia  Assessment & Plan: She has a low HDL and elevated triglycerides consistent with metabolic syndrome and insulin resistance.  Lab Results  Component Value Date   CHOL 137 06/18/2021   HDL 27 (L) 06/18/2021   LDLCALC 70  06/18/2021   TRIG 243 (H) 06/18/2021   CHOLHDL 5.1 (H) 06/18/2021    Continue to work on nutritional strategies.  Reducing simple and added sugars should improve triglycerides.      Insulin resistance Assessment & Plan: She had elevated insulin levels in the past.  This is complex condition associated with genetics, ectopic fat and lifestyle factors. Insulin resistance may result in weight gain, abnormal cravings (particularly for carbs) and fatigue. This may result in additional weight gain and lead to pre-diabetes and diabetes if untreated.   Lab Results  Component Value Date   HGBA1C 5.2 06/18/2021   Lab Results  Component Value Date   INSULIN 23.5 08/24/2020   Lab Results  Component Value Date   GLUCOSE 80 03/06/2022   GLUCOSE 102 (H) 08/24/2006   We reviewed nutritional approaches to reduce insulin resistance.  In addition she will benefit from pharmacotherapy.  After discussion of benefits and side effects she is agreeable to starting metformin XR 500 mg 1 tablet daily.  We will check a fasting blood sugar and insulin levels at the next office visit.  These were not completed with intake labs reasons are unknown.  We are also checking a hemoglobin A1c.   Orders: -     metFORMIN HCl ER; Take 1 tablet (500 mg total) by mouth daily with breakfast.  Dispense: 30 tablet; Refill: 0 -     Hemoglobin A1c -     Insulin, random  Class 3 severe obesity with serious comorbidity and body mass index (BMI) of 40.0 to 44.9 in adult, unspecified obesity type ([place]) -     metFORMIN HCl ER; Take 1 tablet (500 mg total) by mouth daily with breakfast.  Dispense: 30 tablet; Refill: 0 -     Vitamin B12 -     Lipid Panel With LDL/HDL Ratio  Elevated blood pressure reading without diagnosis of hypertension Assessment & Plan: I suspect she has hypertension.  She is declining medical therapy.  She does not have a blood pressure monitor.  I recommended that she buy one.  I would like for her to  check her blood pressure in the morning and also before bedtime to look at trends.  We will consider starting pharmacotherapy if blood pressures are elevated again at the next visit.    Orders: -     Microalbumin / creatinine urine ratio     PHYSICAL EXAM:  Blood pressure 135/85, pulse 93, temperature 97.8 F (36.6 C), height '5\' 8"'$  (1.727 m), weight 299 lb (135.6 kg), last menstrual period 09/29/2019, SpO2 97 %. Body mass index is 45.46 kg/m.  General: She is overweight, cooperative, alert, well developed, and in no acute distress. PSYCH: Has normal mood, affect and thought process.   HEENT: EOMI, sclerae are anicteric. Lungs: Normal breathing effort, no conversational dyspnea. Extremities: No edema.  Neurologic: No gross sensory or motor deficits. No tremors or fasciculations noted.    DIAGNOSTIC DATA REVIEWED:  BMET    Component Value Date/Time   NA 138 03/06/2022 1159   K 4.6 03/06/2022 1159   CL 102 03/06/2022 1159   CO2 24 03/06/2022 1159   GLUCOSE 80 03/06/2022 1159   GLUCOSE 133 (H) 10/26/2019 1515   BUN 11 03/06/2022 1159  CREATININE 0.72 03/06/2022 1159   CREATININE 0.66 05/15/2015 1019   CALCIUM 9.1 03/06/2022 1159   GFRNONAA >60 10/26/2019 1515   GFRNONAA >89 05/15/2015 1019   GFRAA >60 10/26/2019 1515   GFRAA >89 05/15/2015 1019   Lab Results  Component Value Date   HGBA1C 5.2 06/18/2021   HGBA1C 4.8 07/27/2008   Lab Results  Component Value Date   INSULIN 23.5 08/24/2020   Lab Results  Component Value Date   TSH 4.300 02/12/2022   CBC    Component Value Date/Time   WBC 15.3 (H) 02/12/2022 1228   WBC 15.2 (H) 10/26/2019 1515   RBC 5.16 02/12/2022 1228   RBC 4.74 10/26/2019 1515   HGB 14.8 02/12/2022 1228   HGB 14.6 06/28/2008 1529   HCT 44.8 02/12/2022 1228   HCT 42.1 06/28/2008 1529   PLT 250 02/12/2022 1228   MCV 87 02/12/2022 1228   MCV 79.3 (L) 06/28/2008 1529   MCH 28.7 02/12/2022 1228   MCH 27.2 10/26/2019 1515   MCHC 33.0  02/12/2022 1228   MCHC 32.8 10/26/2019 1515   RDW 13.0 02/12/2022 1228   RDW 13.8 06/28/2008 1529   Iron Studies No results found for: "IRON", "TIBC", "FERRITIN", "IRONPCTSAT" Lipid Panel     Component Value Date/Time   CHOL 137 06/18/2021 1122   TRIG 243 (H) 06/18/2021 1122   HDL 27 (L) 06/18/2021 1122   CHOLHDL 5.1 (H) 06/18/2021 1122   CHOLHDL 3.9 Ratio 03/07/2008 2215   VLDL 37 03/07/2008 2215   LDLCALC 70 06/18/2021 1122   Hepatic Function Panel     Component Value Date/Time   PROT 6.7 03/06/2022 1159   ALBUMIN 4.2 03/06/2022 1159   AST 15 03/06/2022 1159   ALT 15 03/06/2022 1159   ALKPHOS 68 03/06/2022 1159   BILITOT 0.3 03/06/2022 1159   BILIDIR 0.07 04/21/2018 1504      Component Value Date/Time   TSH 4.300 02/12/2022 1228   TSH 2.950 08/24/2020 1017   Nutritional Lab Results  Component Value Date   VD25OH 21.8 (L) 02/12/2022   VD25OH 27.3 (L) 02/15/2021   VD25OH 27.6 (L) 08/24/2020     Return in about 2 weeks (around 05/14/2022) for For Weight Mangement with Dr. Gerarda Fraction fasting for labs and .Marland Kitchen She was informed of the importance of frequent follow up visits to maximize her success with intensive lifestyle modifications for her multiple health conditions.   ATTESTASTION STATEMENTS:  Reviewed by clinician on day of visit: allergies, medications, problem list, medical history, surgical history, family history, social history, and previous encounter notes.   Time spent on visit including pre-visit chart review and post-visit care and charting was 40 minutes.    Thomes Dinning, MD

## 2022-05-13 ENCOUNTER — Encounter (HOSPITAL_COMMUNITY): Payer: Self-pay

## 2022-05-13 ENCOUNTER — Ambulatory Visit (INDEPENDENT_AMBULATORY_CARE_PROVIDER_SITE_OTHER): Payer: Medicaid Other | Admitting: Clinical

## 2022-05-13 ENCOUNTER — Encounter (HOSPITAL_COMMUNITY): Payer: Self-pay | Admitting: Clinical

## 2022-05-13 DIAGNOSIS — F4310 Post-traumatic stress disorder, unspecified: Secondary | ICD-10-CM

## 2022-05-13 DIAGNOSIS — F411 Generalized anxiety disorder: Secondary | ICD-10-CM | POA: Diagnosis not present
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
## 2022-05-14 ENCOUNTER — Ambulatory Visit (INDEPENDENT_AMBULATORY_CARE_PROVIDER_SITE_OTHER): Payer: Medicaid Other | Admitting: Physician Assistant

## 2022-05-14 ENCOUNTER — Encounter: Payer: Self-pay | Admitting: Family Medicine

## 2022-05-14 ENCOUNTER — Ambulatory Visit (INDEPENDENT_AMBULATORY_CARE_PROVIDER_SITE_OTHER): Payer: Medicaid Other | Admitting: Family Medicine

## 2022-05-14 ENCOUNTER — Encounter (INDEPENDENT_AMBULATORY_CARE_PROVIDER_SITE_OTHER): Payer: Self-pay | Admitting: Physician Assistant

## 2022-05-14 VITALS — BP 134/83 | HR 81 | Temp 97.7°F | Ht 68.0 in | Wt 292.0 lb

## 2022-05-14 VITALS — BP 146/82 | HR 83 | Wt 295.0 lb

## 2022-05-14 DIAGNOSIS — E782 Mixed hyperlipidemia: Secondary | ICD-10-CM

## 2022-05-14 DIAGNOSIS — E88819 Insulin resistance, unspecified: Secondary | ICD-10-CM

## 2022-05-14 DIAGNOSIS — Z6841 Body Mass Index (BMI) 40.0 and over, adult: Secondary | ICD-10-CM

## 2022-05-14 DIAGNOSIS — R030 Elevated blood-pressure reading, without diagnosis of hypertension: Secondary | ICD-10-CM

## 2022-05-14 DIAGNOSIS — E559 Vitamin D deficiency, unspecified: Secondary | ICD-10-CM | POA: Diagnosis not present

## 2022-05-14 DIAGNOSIS — R42 Dizziness and giddiness: Secondary | ICD-10-CM | POA: Diagnosis present

## 2022-05-14 MED ORDER — VITAMIN D (ERGOCALCIFEROL) 1.25 MG (50000 UNIT) PO CAPS: ORAL_CAPSULE | ORAL | 0 refills | Status: DC

## 2022-05-14 NOTE — Patient Instructions (Addendum)
It was great to see you!  I'm glad you already had lab work checked today.  Please see your eye doctor to ensure your vision is optimized and not contributing to your dizziness. Your symptoms may be related to vertigo. I have placed a referral to physical therapy to try some maneuvers to help with your dizziness.  Take care and seek immediate care sooner if you develop any concerns.  Dr. [facility]

## 2022-05-14 NOTE — Assessment & Plan Note (Signed)
Hyperlipidemia LDL is at goal. Medication(s): not on statin Cardiovascular risk factors: dyslipidemia and obesity (BMI >= 30 kg/m2)  Lab Results  Component Value Date   CHOL 137 06/18/2021   HDL 27 (L) 06/18/2021   LDLCALC 70 06/18/2021   TRIG 243 (H) 06/18/2021   CHOLHDL 5.1 (H) 06/18/2021   CHOLHDL 4.6 (H) 08/24/2020   CHOLHDL 4.5 (H) 02/01/2020   Lab Results  Component Value Date   ALT 15 03/06/2022   AST 15 03/06/2022   ALKPHOS 68 03/06/2022   BILITOT 0.3 03/06/2022   The ASCVD Risk score (Arnett DK, et al., 2019) failed to calculate for the following reasons:   The 2019 ASCVD risk score is only valid for ages 43 to 34  Plan: Continue to work on nutrition plan to improve lipid profile/decrease CV risk and promote weight loss.

## 2022-05-14 NOTE — [place] Notes (Unsigned)
    SUBJECTIVE:   CHIEF COMPLAINT / HPI:   Dizziness -started 2 weeks ago -occurring daily -lightheaded -feels like she might pass out; no syncopal episodes -no vision changes, palpitations, chest pain -associated ear pressure but denies headache -symptoms last up to a few hours -if she sits down, rubs her head it improves -improves if she lays down -feels better if she drinks water. Also better if she showers -much worse if she smokes cigarettes. Now doing 5-6 per day -saw [facility]- they drew a bunch of labs today -is worse when she shakes her head -no recent head injury -some congestion, no fever -med changes: started Lexapro few weeks ago. Thinks dizziness started a few days before she started the Lexapro -dietary changes recently-- symptoms started a few days before she made dietary changes  PERTINENT  PMH / PSH: ***  OBJECTIVE:   BP (!) 146/82   Pulse 83   Wt 295 lb (133.8 kg)   LMP 09/29/2019   SpO2 96%   BMI 44.85 kg/m   ***  ASSESSMENT/PLAN:   No problem-specific Assessment & Plan notes found for this encounter.     Alcus Dad, MD [place]

## 2022-05-14 NOTE — Assessment & Plan Note (Signed)
Current BMI 44.4

## 2022-05-14 NOTE — Assessment & Plan Note (Signed)
Insulin Resistance Mackenzie Gibson has had elevated fasting insulin readings. Goal is HgbA1c < 5.7, fasting insulin closer to 5.   She denies polyphagia. Medication(s): Metformin 500 mg once daily with meals No side effects with metformin.  Lab Results  Component Value Date   HGBA1C 5.2 06/18/2021   HGBA1C 5.1 02/15/2021   HGBA1C 5.0 08/24/2020   HGBA1C 4.8 05/29/2020   HGBA1C 5.1 11/24/2019   Lab Results  Component Value Date   INSULIN 23.5 08/24/2020    Plan: Continue Metformin 500 mg once daily with meals. Will check fasting A1C/Insulin levels today as well as CMP, CBC, B12 and TSH Continue working on nutrition plan to decrease simple carbohydrates, increase lean proteins and exercise to promote weight loss, improve glycemic control and prevent progression to Type 2 diabetes.

## 2022-05-14 NOTE — Assessment & Plan Note (Signed)
Vitamin D Deficiency Vitamin D is not at goal of 50.  Most recent vitamin D level was 21.8. She is on  prescription ergocalciferol 50,000 IU weekly. Lab Results  Component Value Date   VD25OH 21.8 (L) 02/12/2022   VD25OH 27.3 (L) 02/15/2021   VD25OH 27.6 (L) 08/24/2020    Plan: Refill prescription vitamin D 50,000 IU weekly. Recheck Vit D level today.

## 2022-05-14 NOTE — Assessment & Plan Note (Signed)
Elevated blood pressure without diagnosis of hypertension Blood pressure is noted to be borderline elevated today, but normal in the past. [name] denies chest pains or SOB. BP Readings from Last 3 Encounters:  05/14/22 134/83  04/30/22 135/85  04/16/22 130/86    Plan: Continue to monitor.  If blood pressure continues to be elevated at future office visits, will consider starting antihypertensive medication. Continue to work on nutrition plan to promote weight loss and improve BP control.  She is going to call PCP for evaluation of dizziness today.

## 2022-05-14 NOTE — Progress Notes (Signed)
Office: [phone]  /  Fax: [phone]  WEIGHT SUMMARY AND BIOMETRICS  Vitals Temp: 97.7 F (36.5 C) BP: 134/83 Pulse Rate: 81 SpO2: 100 %   Anthropometric Measurements Height: '5\' 8"'$  (1.727 m) Weight: 292 lb (132.5 kg) BMI (Calculated): 44.41 Weight at Last Visit: 299 lb Weight Lost Since Last Visit: 7 lb Starting Weight: 294 lb Total Weight Loss (lbs): 7 lb (3.175 kg)   Body Composition  Body Fat %: 49.4 % Fat Mass (lbs): 144.4 lbs Muscle Mass (lbs): 140.4 lbs Total Body Water (lbs): 100.2 lbs Visceral Fat Rating : 15   Other Clinical Data Fasting: Yes Labs: Yes Today's Visit #: 3 Starting Date: 04/16/22     HPI  Chief Complaint: OBESITY  Mackenzie Gibson is here to discuss her progress with her obesity treatment plan. She is on the the Category 4 Plan and states she is following her eating plan approximately 95 % of the time. She states she is exercising/staying busy/more active 7 times per week.   Interval History:  Since last office visit she has done well with weight loss. Has best friend with her today for her visit.  Reports eating more at home and following plan more closely overall. More active as well, did some deep spring cleaning and then vacationed with her 4 children and mother at the beach- swam and walking a lot. Reports mom has had bariatric surgery, lost significant weight and has been trying to help the patient with her nutrition. Not drinking sodas.  Works at [employer], but generally does not eat there anymore.  Trying to drink plenty of water and meet protein goals consistently.   Has had some episodes of dizziness lately without associated other symptoms which resolve without treatment and resolve quickly. We discussed she should get in to see her PCP for evaluation of dizziness as soon as possible and she was instructed to call PCP for appointment today and she was agreeable to this plan.     Pharmacotherapy: No medications for obesity.    On metformin for insulin resistance.  PHYSICAL EXAM:  Blood pressure 134/83, pulse 81, temperature 97.7 F (36.5 C), height '5\' 8"'$  (1.727 m), weight 292 lb (132.5 kg), last menstrual period 09/29/2019, SpO2 100 %. Body mass index is 44.4 kg/m.  General: She is overweight, cooperative, alert, well developed, and in no acute distress. PSYCH: Has normal mood, affect and thought process.   Lungs: Normal breathing effort, no conversational dyspnea.  DIAGNOSTIC DATA REVIEWED:  BMET    Component Value Date/Time   NA 138 03/06/2022 1159   K 4.6 03/06/2022 1159   CL 102 03/06/2022 1159   CO2 24 03/06/2022 1159   GLUCOSE 80 03/06/2022 1159   GLUCOSE 133 (H) 10/26/2019 1515   BUN 11 03/06/2022 1159   CREATININE 0.72 03/06/2022 1159   CREATININE 0.66 05/15/2015 1019   CALCIUM 9.1 03/06/2022 1159   GFRNONAA >60 10/26/2019 1515   GFRNONAA >89 05/15/2015 1019   GFRAA >60 10/26/2019 1515   GFRAA >89 05/15/2015 1019   Lab Results  Component Value Date   HGBA1C 5.2 06/18/2021   HGBA1C 4.8 07/27/2008   Lab Results  Component Value Date   INSULIN 23.5 08/24/2020   Lab Results  Component Value Date   TSH 4.300 02/12/2022   CBC    Component Value Date/Time   WBC 15.3 (H) 02/12/2022 1228   WBC 15.2 (H) 10/26/2019 1515   RBC 5.16 02/12/2022 1228   RBC 4.74 10/26/2019 1515   HGB  14.8 02/12/2022 1228   HGB 14.6 06/28/2008 1529   HCT 44.8 02/12/2022 1228   HCT 42.1 06/28/2008 1529   PLT 250 02/12/2022 1228   MCV 87 02/12/2022 1228   MCV 79.3 (L) 06/28/2008 1529   MCH 28.7 02/12/2022 1228   MCH 27.2 10/26/2019 1515   MCHC 33.0 02/12/2022 1228   MCHC 32.8 10/26/2019 1515   RDW 13.0 02/12/2022 1228   RDW 13.8 06/28/2008 1529   Iron Studies No results found for: "IRON", "TIBC", "FERRITIN", "IRONPCTSAT" Lipid Panel     Component Value Date/Time   CHOL 137 06/18/2021 1122   TRIG 243 (H) 06/18/2021 1122   HDL 27 (L) 06/18/2021 1122   CHOLHDL 5.1 (H) 06/18/2021 1122    CHOLHDL 3.9 Ratio 03/07/2008 2215   VLDL 37 03/07/2008 2215   LDLCALC 70 06/18/2021 1122   Hepatic Function Panel     Component Value Date/Time   PROT 6.7 03/06/2022 1159   ALBUMIN 4.2 03/06/2022 1159   AST 15 03/06/2022 1159   ALT 15 03/06/2022 1159   ALKPHOS 68 03/06/2022 1159   BILITOT 0.3 03/06/2022 1159   BILIDIR 0.07 04/21/2018 1504      Component Value Date/Time   TSH 4.300 02/12/2022 1228   TSH 2.950 08/24/2020 1017   Nutritional Lab Results  Component Value Date   VD25OH 21.8 (L) 02/12/2022   VD25OH 27.3 (L) 02/15/2021   VD25OH 27.6 (L) 08/24/2020    ASSOCIATED CONDITIONS ADDRESSED TODAY  ASSESSMENT AND PLAN  Problem List Items Addressed This Visit     Morbid obesity ([place])   Relevant Orders   TSH   ELEVATED BP READING WITHOUT DX HYPERTENSION    Elevated blood pressure without diagnosis of hypertension Blood pressure is noted to be borderline elevated today, but normal in the past. Mackenzie Gibson denies chest pains or SOB. BP Readings from Last 3 Encounters:  05/14/22 134/83  04/30/22 135/85  04/16/22 130/86   Plan: Continue to monitor.  If blood pressure continues to be elevated at future office visits, will consider starting antihypertensive medication. Continue to work on nutrition plan to promote weight loss and improve BP control.  She is going to call PCP for evaluation of dizziness today.       Relevant Orders   VITAMIN D 25 Hydroxy (Vit-D Deficiency, Fractures)   Vitamin D deficiency    Vitamin D Deficiency Vitamin D is not at goal of 50.  Most recent vitamin D level was 21.8. She is on  prescription ergocalciferol 50,000 IU weekly. Lab Results  Component Value Date   VD25OH 21.8 (L) 02/12/2022   VD25OH 27.3 (L) 02/15/2021   VD25OH 27.6 (L) 08/24/2020   Plan: Refill prescription vitamin D 50,000 IU weekly. Recheck Vit D level today.        Relevant Medications   Vitamin D, Ergocalciferol, (DRISDOL) 1.25 MG (50000 UNIT) CAPS capsule    Insulin resistance - Primary    Insulin Resistance Mackenzie Gibson has had elevated fasting insulin readings. Goal is HgbA1c < 5.7, fasting insulin closer to 5.   She denies polyphagia. Medication(s): Metformin 500 mg once daily with meals No side effects with metformin.  Lab Results  Component Value Date   HGBA1C 5.2 06/18/2021   HGBA1C 5.1 02/15/2021   HGBA1C 5.0 08/24/2020   HGBA1C 4.8 05/29/2020   HGBA1C 5.1 11/24/2019   Lab Results  Component Value Date   INSULIN 23.5 08/24/2020   Plan: Continue Metformin 500 mg once daily with meals. Will check fasting A1C/Insulin levels  today as well as CMP, CBC, B12 and TSH Continue working on nutrition plan to decrease simple carbohydrates, increase lean proteins and exercise to promote weight loss, improve glycemic control and prevent progression to Type 2 diabetes.         Relevant Orders   Vitamin B12   CBC with Differential/Platelet   CMP14+EGFR   Hemoglobin A1c   Insulin, random   Mixed hyperlipidemia   Relevant Orders   Lipid Panel With LDL/HDL Ratio   BMI 40.0-44.9, adult (HCC)    Current BMI 44.4         TREATMENT PLAN FOR OBESITY:  Recommended Dietary Goals  Rae is currently in the action stage of change. As such, her goal is to continue weight management plan. She has agreed to the Category 4 Plan.  Behavioral Intervention  We discussed the following Behavioral Modification Strategies today: increasing lean protein intake, decreasing simple carbohydrates , increasing vegetables, increasing water intake, and work on meal planning and easy cooking plans.  Additional resources provided today: NA  Recommended Physical Activity Goals  Laqueda has been advised to work up to 150 minutes of moderate intensity aerobic activity a week and strengthening exercises 2-3 times per week for cardiovascular health, weight loss maintenance and preservation of muscle mass.   She has agreed to increase physical activity in  their day and reduce sedentary time (increase NEAT).    Pharmacotherapy We discussed various medication options to help Mackenzie Gibson with her weight loss efforts and we both agreed to continue metformin for insulin resistance.    Return in about 3 weeks (around 06/04/2022).Marland Kitchen She was informed of the importance of frequent follow up visits to maximize her success with intensive lifestyle modifications for her multiple health conditions.   ATTESTASTION STATEMENTS:  Reviewed by clinician on day of visit: allergies, medications, problem list, medical history, surgical history, family history, social history, and previous encounter notes.   I have personally spent 36 minutes total time today in preparation, patient care, nutritional counseling and documentation for this visit, including the following: review of clinical lab tests; review of medical tests/procedures/services.      Pleshette Tomasini, PA-C

## 2022-05-15 DIAGNOSIS — R42 Dizziness and giddiness: Secondary | ICD-10-CM | POA: Insufficient documentation

## 2022-05-15 LAB — CMP14+EGFR
ALT: 21 IU/L (ref 0–32)
AST: 14 IU/L (ref 0–40)
Albumin/Globulin Ratio: 1.6 (ref 1.2–2.2)
Albumin: 4.5 g/dL (ref 3.9–4.9)
Alkaline Phosphatase: 67 IU/L (ref 44–121)
BUN/Creatinine Ratio: 16 (ref 9–23)
BUN: 11 mg/dL (ref 6–20)
Bilirubin Total: 0.3 mg/dL (ref 0.0–1.2)
CO2: 21 mmol/L (ref 20–29)
Calcium: 9.2 mg/dL (ref 8.7–10.2)
Chloride: 104 mmol/L (ref 96–106)
Creatinine, Ser: 0.67 mg/dL (ref 0.57–1.00)
Globulin, Total: 2.8 g/dL (ref 1.5–4.5)
Glucose: 89 mg/dL (ref 70–99)
Potassium: 4.7 mmol/L (ref 3.5–5.2)
Sodium: 141 mmol/L (ref 134–144)
Total Protein: 7.3 g/dL (ref 6.0–8.5)
eGFR: 115 mL/min/{1.73_m2} (ref >59)

## 2022-05-15 LAB — LIPID PANEL WITH LDL/HDL RATIO
Cholesterol, Total: 108 mg/dL (ref 100–199)
HDL: 34 mg/dL — ABNORMAL LOW (ref >39)
LDL Chol Calc (NIH): 50 mg/dL (ref 0–99)
LDL/HDL Ratio: 1.5 ratio (ref 0.0–3.2)
Triglycerides: 135 mg/dL (ref 0–149)
VLDL Cholesterol Cal: 24 mg/dL (ref 5–40)

## 2022-05-15 LAB — CBC WITH DIFFERENTIAL/PLATELET
Basophils Absolute: 0.1 10*3/uL (ref 0.0–0.2)
Basos: 1 %
EOS (ABSOLUTE): 0.2 10*3/uL (ref 0.0–0.4)
Eos: 2 %
Hematocrit: 45.7 % (ref 34.0–46.6)
Hemoglobin: 15.2 g/dL (ref 11.1–15.9)
Immature Grans (Abs): 0.1 10*3/uL (ref 0.0–0.1)
Immature Granulocytes: 1 %
Lymphocytes Absolute: 2.4 10*3/uL (ref 0.7–3.1)
Lymphs: 26 %
MCH: 28.9 pg (ref 26.6–33.0)
MCHC: 33.3 g/dL (ref 31.5–35.7)
MCV: 87 fL (ref 79–97)
Monocytes Absolute: 0.4 10*3/uL (ref 0.1–0.9)
Monocytes: 4 %
Neutrophils Absolute: 6.2 10*3/uL (ref 1.4–7.0)
Neutrophils: 66 %
Platelets: 233 10*3/uL (ref 150–450)
RBC: 5.26 x10E6/uL (ref 3.77–5.28)
RDW: 13 % (ref 11.7–15.4)
WBC: 9.4 10*3/uL (ref 3.4–10.8)

## 2022-05-15 LAB — HEMOGLOBIN A1C
Est. average glucose Bld gHb Est-mCnc: 97 mg/dL
Hgb A1c MFr Bld: 5 % (ref 4.8–5.6)

## 2022-05-15 LAB — VITAMIN D 25 HYDROXY (VIT D DEFICIENCY, FRACTURES): Vit D, 25-Hydroxy: 29.8 ng/mL — ABNORMAL LOW (ref 30.0–100.0)

## 2022-05-15 LAB — INSULIN, RANDOM: INSULIN: 12.9 u[IU]/mL (ref 2.6–24.9)

## 2022-05-15 LAB — VITAMIN B12: Vitamin B-12: 372 pg/mL (ref 232–1245)

## 2022-05-15 LAB — TSH: TSH: 2.6 u[IU]/mL (ref 0.450–4.500)

## 2022-05-15 NOTE — Assessment & Plan Note (Addendum)
Presentation consistent with peripheral cause of vertigo given intermittent nature, no red flags on history, normal neuro exam (aside from baseline nystagmus). Unable to interpret Dix-Hallpike due to baseline nystagmus, but she is symptomatic with position changes so possible BPPV. Will refer to PT for vestibular rehab. Orthostatic vitals negative. Unlikely medication effect. There may also be an anxiety component vs contribution from her vision issues. Advised she see her eye doctor as well. ED/return precautions reviewed.

## 2022-05-21 ENCOUNTER — Telehealth (HOSPITAL_COMMUNITY): Payer: Medicaid Other | Admitting: Psychiatry

## 2022-05-21 ENCOUNTER — Encounter (HOSPITAL_COMMUNITY): Payer: Self-pay

## 2022-05-21 ENCOUNTER — Telehealth (HOSPITAL_BASED_OUTPATIENT_CLINIC_OR_DEPARTMENT_OTHER): Payer: Medicaid Other | Admitting: Psychiatry

## 2022-05-21 ENCOUNTER — Encounter (HOSPITAL_COMMUNITY): Payer: Self-pay | Admitting: Psychiatry

## 2022-05-21 DIAGNOSIS — F411 Generalized anxiety disorder: Secondary | ICD-10-CM

## 2022-05-21 DIAGNOSIS — F4310 Post-traumatic stress disorder, unspecified: Secondary | ICD-10-CM

## 2022-05-21 MED ORDER — ESCITALOPRAM OXALATE 10 MG PO TABS: 10.0000 mg | ORAL_TABLET | Freq: Every day | ORAL | 0 refills | Status: AC

## 2022-05-21 NOTE — Progress Notes (Signed)
BH MD/PA/NP OP Progress Note  05/21/2022 8:00 AM Mackenzie Gibson  MRN:  [id number]  Visit Diagnosis:    ICD-10-CM   1. PTSD (post-traumatic stress disorder)  F43.10     2. GAD (generalized anxiety disorder)  F41.1       Assessment: IRIDESSA HEIDEBRINK is a 37 y.o. female with a history of PCOS, vitamin D deficiency, who presented to [place] [facility] for initial evaluation on 04/19/2022.  At initial evaluation patient reported symptoms of anxiety including excessive worry that she is unable to control, increased irritability, fear of something awful happening, difficulty relaxing, and has been experiencing panic attacks. Patient also endorsed a significant past trauma hx from which she still experiences increased startle response, hypervigilance, emotional numbness, and avoidance of past triggers. Of note patient was screened for borderline personality disorder and while she did meet 4 criteria it was not enough for diagnosis. Patient met criteria for PTSD and GAD.   Mackenzie Gibson presents for follow-up evaluation. Today, 05/21/22, patient reports an improvement in her mood and anxiety symptoms over the past month.  She has tolerated Lexapro well and denies any adverse side effects.  She did not try the Atarax over the past month as it was not needed.  Of note patient did develop dizziness 2 to 3 weeks ago however it appears to be related to peripheral calls most likely secondary to medication.  We will continue on her current medication regimen patient to continue with therapy.  Plan: - Continue Lexapro 10 mg QD - Continue Atarax 10 mg TID prn for anxiety - Continue therapy with [facility] - CMP, CBC, Vit D, TSH reviewed - EKG from 04/16/22 reviewed QTC 427 - Crisis resources reviewed - Follow up in 2 months    Chief Complaint:  Chief Complaint  Patient presents with   Follow-up   HPI: Mackenzie Gibson presents reporting that things have been pretty  good over the past month. Her mood has gotten better and she has not had any anxiety attacks at all.  The only negatives she has experienced is some increase in dizziness that started about 2 weeks ago.  She did present to her PCP who felt that side effects are less likely due to medication and instead possibly due to peripheral cause such as BPPV.  Patient was referred to the PT for further evaluation.  Patient also felt like the medication was not related to the dizziness spells.  As far as medication goes she has she takes Lexapro daily and has not noticed any side effects.  As for the Atarax she has not needed it at all during the past month.  Patient has also noticed benefit from therapy.  At this point she is interested in continuing on her current medication regimen.  Past Psychiatric History: Patient denies any prior psychiatric hospitalizations or suicide attempts.  She has been connected with a therapist in the past but denies any other psychiatric providers.  She has been off her medications in the past however has not tried them.  Started Lexapro and Atarax on 04/19/2022.  Patient denied current substance use other than smoking a pack of cigarettes a day.  She expressed an interest in cutting back as they have made her panic attacks worse.  Past Medical History:  Past Medical History:  Diagnosis Date   Anxiety    Arthritis    Back pain    Bipolar disorder ([place])    Bipolar disorder ([place])  Chronic hypertension in pregnancy 03/02/2015   Constipation    Depression    Family history of adverse reaction to anesthesia    mom "woke up during" surgery    Gallbladder problem    Hidradenitis suppurativa    High blood pressure    HPV (human papilloma virus) infection    Joint pain    Morbid obesity (HCC)    PCOS (polycystic ovarian syndrome)    PONV (postoperative nausea and vomiting)    Prediabetes    Pregnancy induced hypertension    SOBOE (shortness of breath on exertion)     Swallowing difficulty    Vitamin D deficiency     Past Surgical History:  Procedure Laterality Date   CHOLECYSTECTOMY     EYE SURGERY     TUBAL LIGATION Bilateral 03/03/2015   Procedure: POST PARTUM TUBAL LIGATION;  Surgeon: Guss Bunde, MD;  Location: [facility];  Service: Gynecology;  Laterality: Bilateral;   VAGINAL HYSTERECTOMY Bilateral 10/26/2019   Procedure: HYSTERECTOMY VAGINAL;  Surgeon: Chancy Milroy, MD;  Location: [facility];  Service: Gynecology;  Laterality: Bilateral;    Family History:  Family History  Problem Relation Age of Onset   Cancer Mother    Depression Mother    Bipolar disorder Mother    Rheum arthritis Mother    Cervical cancer Mother    Anxiety disorder Mother    Alcoholism Mother    Obesity Mother    Depression Father    High blood pressure Father    Cervical cancer Sister    Cervical cancer Maternal Grandmother    Bipolar disorder Maternal Aunt     Social History:  Social History   Socioeconomic History   Marital status: Divorced    Spouse name: Not on file   Number of children: 3   Years of education: Not on file   Highest education level: Not on file  Occupational History   Occupation: [employer] employee    Employer: [employer]  Tobacco Use   Smoking status: Every Day    Years: 14    Types: Cigarettes    Passive exposure: Past   Smokeless tobacco: Never  Vaping Use   Vaping Use: Never used  Substance and Sexual Activity   Alcohol use: Yes    Comment: occasionally, once/month   Drug use: No   Sexual activity: Yes    Birth control/protection: None  Other Topics Concern   Not on file  Social History Narrative   Not on file   Social Determinants of Health   Financial Resource Strain: High Risk (05/13/2022)   Overall Financial Resource Strain (CARDIA)    Difficulty of Paying Living Expenses: Hard  Food Insecurity: Food Insecurity Present (11/24/2019)   Hunger Vital Sign    Worried About Running Out of Food  in the Last Year: Sometimes true    Ran Out of Food in the Last Year: Sometimes true  Transportation Needs: No Transportation Needs (11/24/2019)   PRAPARE - Hydrologist (Medical): No    Lack of Transportation (Non-Medical): No  Physical Activity: Not on file  Stress: Not on file  Social Connections: Moderately Integrated (05/13/2022)   Social Connection and Isolation Panel [NHANES]    Frequency of Communication with Friends and Family: More than three times a week    Frequency of Social Gatherings with Friends and Family: More than three times a week    Attends Religious Services: More than 4 times per year  Active Member of Clubs or Organizations: No    Attends Archivist Meetings: 1 to 4 times per year    Marital Status: Divorced    Allergies:  Allergies  Allergen Reactions   Hydrocodone Anaphylaxis and Other (See Comments)     Tongue swelling    Current Medications: Current Outpatient Medications  Medication Sig Dispense Refill   albuterol (VENTOLIN HFA) 108 (90 Base) MCG/ACT inhaler Inhale 2 puffs into the lungs every 6 (six) hours as needed for wheezing.     benzonatate (TESSALON) 100 MG capsule Take 1 capsule (100 mg total) by mouth 3 (three) times daily as needed for cough. Do not take with alcohol or while driving or operating heavy machinery (Patient not taking: Reported on 05/14/2022) 21 capsule 0   Cholecalciferol (VITAMIN D3) 50 MCG (2000 UT) CAPS Take 2 capsules (4,000 Units total) by mouth daily. 30 capsule 3   doxycycline (VIBRAMYCIN) 50 MG capsule Take 1 capsule (50 mg total) by mouth 2 (two) times daily. (Patient not taking: Reported on 05/14/2022) 60 capsule 2   escitalopram (LEXAPRO) 10 MG tablet Take 1 tablet (10 mg total) by mouth daily. 30 tablet 2   fluticasone (FLONASE) 50 MCG/ACT nasal spray Place 2 sprays into both nostrils daily. (Patient not taking: Reported on 05/14/2022) 16 g 6   hydrOXYzine (ATARAX) 10 MG tablet Take  1 tablet (10 mg total) by mouth 3 (three) times daily as needed. (Patient not taking: Reported on 05/14/2022) 90 tablet 1   ipratropium (ATROVENT) 0.06 % nasal spray Place 2 sprays into both nostrils daily.     lidocaine (XYLOCAINE) 5 % ointment Apply a small amount to heel area 3 or 4 times a day as needed 35.44 g 0   metFORMIN (GLUCOPHAGE-XR) 500 MG 24 hr tablet Take 1 tablet (500 mg total) by mouth daily with breakfast. (Patient not taking: Reported on 05/14/2022) 30 tablet 0   ondansetron (ZOFRAN ODT) 4 MG disintegrating tablet Take 1 tablet (4 mg total) by mouth every 8 (eight) hours as needed for nausea or vomiting. (Patient not taking: Reported on 05/14/2022) 10 tablet 0   polyethylene glycol powder (GLYCOLAX/MIRALAX) 17 GM/SCOOP powder Take 17 g by mouth daily. For clean out put 8 capfuls in 32 oz of clear liquid and drink over the next 4 hours. 3350 g 1   senna (SENOKOT) 8.6 MG TABS tablet Take 1 tablet (8.6 mg total) by mouth daily. (Patient not taking: Reported on 05/14/2022) 120 tablet 0   Vitamin D, Ergocalciferol, (DRISDOL) 1.25 MG (50000 UNIT) CAPS capsule TAKE ONE CAPSULE BY MOUTH ONCE EVERY 7 DAYS. 4 capsule 0   No current facility-administered medications for this visit.     Psychiatric Specialty Exam: Review of Systems  Last menstrual period 09/29/2019.There is no height or weight on file to calculate BMI.  General Appearance: Fairly Groomed  Eye Contact:  Fair  Speech:  Clear and Coherent and Normal Rate  Volume:  Normal  Mood:  Euthymic  Affect:  Congruent  Thought Process:  Coherent and Goal Directed  Orientation:  Full (Time, Place, and Person)  Thought Content: Logical   Suicidal Thoughts:  No  Homicidal Thoughts:  No  Memory:  NA  Judgement:  Good  Insight:  Fair  Psychomotor Activity:  Normal  Concentration:  Concentration: Good  Recall:  Good  Fund of Knowledge: Fair  Language: Good  Akathisia:  NA    AIMS (if indicated): not done  Assets:  Communication  Skills Resilience  Talents/Skills Transportation Vocational/Educational  ADL's:  Intact  Cognition: WNL  Sleep:  Good   Metabolic Disorder Labs: Lab Results  Component Value Date   HGBA1C 5.0 05/14/2022   MPG 97 07/28/2014   Lab Results  Component Value Date   PROLACTIN 5.7 04/21/2017   PROLACTIN 5.8 07/20/2008   Lab Results  Component Value Date   CHOL 108 05/14/2022   TRIG 135 05/14/2022   HDL 34 (L) 05/14/2022   CHOLHDL 5.1 (H) 06/18/2021   VLDL 37 03/07/2008   LDLCALC 50 05/14/2022   LDLCALC 70 06/18/2021   Lab Results  Component Value Date   TSH 2.600 05/14/2022   TSH 4.300 02/12/2022    Therapeutic Level Labs: No results found for: "LITHIUM" No results found for: "VALPROATE" No results found for: "CBMZ"   Screenings: GAD-7    Flowsheet Row Office Visit from 04/19/2022 in [facility] Office Visit from 11/24/2019 in [place] [facility] [place] [facility] Office Visit from 07/19/2019 in [place] [facility] [place] [facility]  Total GAD-7 Score 11 3 4       PHQ2-9    [place] Visit from 05/14/2022 in [place] Office Visit from 04/19/2022 in [facility] Office Visit from 03/06/2022 in [place] Office Visit from 02/12/2022 in [place] Office Visit from 06/22/2021 in [place]  PHQ-2 Total Score 1 1 1  0 2  PHQ-9 Total Score 6 8 8 9 6       [place] Office Visit from 04/19/2022 in [facility] ED from 11/13/2021 in [facility] [place] No Risk No Risk       Collaboration of Care: Collaboration of Care: Medication Management AEB medication prescription, Primary Care Provider AEB chart review, and Referral or follow-up with counselor/therapist  AEB chart review  Patient/Guardian was advised Release of Information must be obtained prior to any record release in order to collaborate their care with an outside provider. Patient/Guardian was advised if they have not already done so to contact the registration department to sign all necessary forms in order for [name] to release information regarding their care.   Consent: Patient/Guardian gives verbal consent for treatment and assignment of benefits for services provided during this visit. Patient/Guardian expressed understanding and agreed to proceed.    Vista Mink, MD 05/21/2022, 8:00 AM   Virtual Visit via Video Note  I connected with Mackenzie Gibson on 05/21/22 at  9:00 AM EDT by a video enabled telemedicine application and verified that I am speaking with the correct person using two identifiers.  Location: Patient: Work Provider: Biomedical scientist   I discussed the limitations of evaluation and management by telemedicine and the availability of in person appointments. The patient expressed understanding and agreed to proceed.   I discussed the assessment and treatment plan with the patient. The patient was provided an opportunity to ask questions and all were answered. The patient agreed with the plan and demonstrated an understanding of the instructions.   The patient was advised to call back or seek an in-person evaluation if the symptoms worsen or if the condition fails to improve as anticipated.  I provided 15 minutes of non-face-to-face time during this encounter.   Vista Mink, MD

## 2022-05-27 ENCOUNTER — Ambulatory Visit: Payer: Medicaid Other | Attending: Physical Therapy | Admitting: Physical Therapy

## 2022-06-04 ENCOUNTER — Ambulatory Visit (INDEPENDENT_AMBULATORY_CARE_PROVIDER_SITE_OTHER): Payer: Medicaid Other | Admitting: Physician Assistant

## 2022-06-04 ENCOUNTER — Encounter (INDEPENDENT_AMBULATORY_CARE_PROVIDER_SITE_OTHER): Payer: Self-pay | Admitting: Physician Assistant

## 2022-06-04 VITALS — BP 132/90 | HR 67 | Temp 97.9°F | Ht 68.0 in | Wt 291.0 lb

## 2022-06-04 DIAGNOSIS — E88819 Insulin resistance, unspecified: Secondary | ICD-10-CM | POA: Diagnosis not present

## 2022-06-04 DIAGNOSIS — Z6841 Body Mass Index (BMI) 40.0 and over, adult: Secondary | ICD-10-CM

## 2022-06-04 DIAGNOSIS — R030 Elevated blood-pressure reading, without diagnosis of hypertension: Secondary | ICD-10-CM | POA: Diagnosis not present

## 2022-06-04 DIAGNOSIS — E559 Vitamin D deficiency, unspecified: Secondary | ICD-10-CM

## 2022-06-04 MED ORDER — VITAMIN D (ERGOCALCIFEROL) 1.25 MG (50000 UNIT) PO CAPS: ORAL_CAPSULE | ORAL | 0 refills | Status: DC

## 2022-06-04 NOTE — Progress Notes (Signed)
Office: [phone]  /  Fax: [phone]  WEIGHT SUMMARY AND BIOMETRICS  Vitals Temp: 97.9 F (36.6 C) BP: (!) 132/90 Pulse Rate: 67 SpO2: 99 %   Anthropometric Measurements Height: 5\' 8"  (1.727 m) Weight: 291 lb (132 kg) BMI (Calculated): 44.26 Weight at Last Visit: 292 lb Weight Lost Since Last Visit: 1 lb Weight Gained Since Last Visit: 0 lb Starting Weight: 294 lb Total Weight Loss (lbs): 8 lb (3.629 kg)   Body Composition  Body Fat %: 46 % Fat Mass (lbs): 134 lbs Muscle Mass (lbs): 149.4 lbs Total Body Water (lbs): 104.6 lbs   Other Clinical Data Fasting: Yes Labs: No Today's Visit #: 4 Starting Date: 04/16/22     HPI  Chief Complaint: OBESITY  Mackenzie Gibson is here to discuss her progress with her obesity treatment plan. She is on the the Category 4 Plan and states she is following her eating plan approximately 99 % of the time. She states she is exercising/walking at [employer] 30 minutes 5 times per week.   Interval History:  Since last office visit she has done well overall with weight loss.  She is down 1 lb/ Total of 8 lbs since 04/16/22 She reports fairly good adherence to nutrition plan overall. She did enjoy Easter dinner and ate off plan slightly, but got back on track quickly.  She fractured her right little toe and this has slowed her activity somewhat.  She stopped smoking cigarettes on March 31st!!!  Working at [employer] - doing well with avoiding eating from work.   Dizziness- improved. Went evaluation and was felt to be BPPV and she is going to PT today to address, but is overall improved.    Pharmacotherapy: She did not start metformin yet and would like to hold off on starting medications.   PHYSICAL EXAM:  Blood pressure (!) 132/90, pulse 67, temperature 97.9 F (36.6 C), height 5\' 8"  (1.727 m), weight 291 lb (132 kg), last menstrual period 09/29/2019, SpO2 99 %. Body mass index is 44.25 kg/m.  General: She is overweight,  cooperative, alert, well developed, and in no acute distress. PSYCH: Has normal mood, affect and thought process.   Cardiovascular: regular rhythm Lungs: Normal breathing effort, no conversational dyspnea. 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   DIAGNOSTIC DATA REVIEWED:  BMET    Component Value Date/Time   NA 141 05/14/2022 1019   K 4.7 05/14/2022 1019   CL 104 05/14/2022 1019   CO2 21 05/14/2022 1019   GLUCOSE 89 05/14/2022 1019   GLUCOSE 133 (H) 10/26/2019 1515   BUN 11 05/14/2022 1019   CREATININE 0.67 05/14/2022 1019   CREATININE 0.66 05/15/2015 1019   CALCIUM 9.2 05/14/2022 1019   GFRNONAA >60 10/26/2019 1515   GFRNONAA >89 05/15/2015 1019   GFRAA >60 10/26/2019 1515   GFRAA >89 05/15/2015 1019   Lab Results  Component Value Date   HGBA1C 5.0 05/14/2022   HGBA1C 4.8 07/27/2008   Lab Results  Component Value Date   INSULIN 12.9 05/14/2022   INSULIN 23.5 08/24/2020   Lab Results  Component Value Date   TSH 2.600 05/14/2022   CBC    Component Value Date/Time   WBC 9.4 05/14/2022 1019   WBC 15.2 (H) 10/26/2019 1515   RBC 5.26 05/14/2022 1019   RBC 4.74 10/26/2019 1515   HGB 15.2 05/14/2022 1019   HGB 14.6 06/28/2008 1529   HCT 45.7 05/14/2022 1019   HCT 42.1 06/28/2008 1529  PLT 233 05/14/2022 1019   MCV 87 05/14/2022 1019   MCV 79.3 (L) 06/28/2008 1529   MCH 28.9 05/14/2022 1019   MCH 27.2 10/26/2019 1515   MCHC 33.3 05/14/2022 1019   MCHC 32.8 10/26/2019 1515   RDW 13.0 05/14/2022 1019   RDW 13.8 06/28/2008 1529   Iron Studies No results found for: "IRON", "TIBC", "FERRITIN", "IRONPCTSAT" Lipid Panel     Component Value Date/Time   CHOL 108 05/14/2022 1019   TRIG 135 05/14/2022 1019   HDL 34 (L) 05/14/2022 1019   CHOLHDL 5.1 (H) 06/18/2021 1122   CHOLHDL 3.9 Ratio 03/07/2008 2215   VLDL 37 03/07/2008 2215   LDLCALC 50 05/14/2022 1019   Hepatic Function Panel     Component Value Date/Time   PROT  7.3 05/14/2022 1019   ALBUMIN 4.5 05/14/2022 1019   AST 14 05/14/2022 1019   ALT 21 05/14/2022 1019   ALKPHOS 67 05/14/2022 1019   BILITOT 0.3 05/14/2022 1019   BILIDIR 0.07 04/21/2018 1504      Component Value Date/Time   TSH 2.600 05/14/2022 1019   Nutritional Lab Results  Component Value Date   VD25OH 29.8 (L) 05/14/2022   VD25OH 21.8 (L) 02/12/2022   VD25OH 27.3 (L) 02/15/2021    ASSOCIATED CONDITIONS ADDRESSED TODAY  ASSESSMENT AND PLAN  Problem List Items Addressed This Visit     Morbid obesity   ELEVATED BP READING WITHOUT DX HYPERTENSION    Elevated blood pressure without diagnosis of hypertension Blood pressure is noted to be borderline elevated today, but normal in the past. Mackenzie Gibson denies chest pains or SOB. She reports was briefly on HCTZ following the birth of one of her children and had pre-eclampsia then, but no recent medications.  She is working on Camera operator to promote weight loss and improve BP. We discussed decreasing sodium/salt in diet and will monitor.  She would like to avoid medication if possible.  BP Readings from Last 3 Encounters:  06/04/22 (!) 132/90  05/14/22 (!) 146/82  05/14/22 134/83   Plan: Continue to monitor.  If blood pressure continues to be elevated at future office visits, will consider starting antihypertensive medication.         Vitamin D deficiency    Vitamin D Deficiency Vitamin D is not at goal of 50.  Most recent vitamin D level was 29.8. She is on  prescription ergocalciferol 50,000 IU weekly. Lab Results  Component Value Date   VD25OH 29.8 (L) 05/14/2022   VD25OH 21.8 (L) 02/12/2022   VD25OH 27.3 (L) 02/15/2021   Plan: Refill prescription vitamin D 50,000 IU weekly. Low vitamin D levels can be associated with adiposity and may result in leptin resistance and weight gain. Also associated with fatigue. Currently on vitamin D supplementation without any adverse effects.         Relevant Medications    Vitamin D, Ergocalciferol, (DRISDOL) 1.25 MG (50000 UNIT) CAPS capsule   Insulin resistance - Primary    Insulin Resistance Last fasting insulin was 12.9- much improved . A1c was 5.0. Polyphagia:No Medication(s): None She did not start metformin and would like to hold off on medications for now. She is working on Camera operator to decrease simple carbohydrates, increase lean proteins and exercise to promote weight loss, improve glycemic control and prevent progression to Type 2 diabetes.   Lab Results  Component Value Date   HGBA1C 5.0 05/14/2022   HGBA1C 5.2 06/18/2021   HGBA1C 5.1 02/15/2021   HGBA1C 5.0 08/24/2020  HGBA1C 4.8 05/29/2020   Lab Results  Component Value Date   INSULIN 12.9 05/14/2022   INSULIN 23.5 08/24/2020   Plan:  Continue working on nutrition plan to decrease simple carbohydrates, increase lean proteins and exercise to promote weight loss, improve glycemic control and prevent progression to Type 2 diabetes.  Increase protein intake. Increase fiber intake.        BMI 40.0-44.9, adult  Current BMI 44.3    TREATMENT PLAN FOR OBESITY:  Recommended Dietary Goals  Ansa is currently in the action stage of change. As such, her goal is to continue weight management plan. She has agreed to the Category 4 Plan.  Behavioral Intervention  We discussed the following Behavioral Modification Strategies today: increasing lean protein intake, decreasing simple carbohydrates , avoiding skipping meals, increasing water intake, continue to work on implementation of reduced calorie nutritional plan, and planning for success.  Additional resources provided today: NA  Recommended Physical Activity Goals  Mikayle has been advised to work up to 150 minutes of moderate intensity aerobic activity a week and strengthening exercises 2-3 times per week for cardiovascular health, weight loss maintenance and preservation of muscle mass.   She has agreed to Continue  current level of physical activity     Return in about 4 weeks (around 07/02/2022).Marland Kitchen She was informed of the importance of frequent follow up visits to maximize her success with intensive lifestyle modifications for her multiple health conditions.   ATTESTASTION STATEMENTS:  Reviewed by clinician on day of visit: allergies, medications, problem list, medical history, surgical history, family history, social history, and previous encounter notes.   I have personally spent 30 minutes total time today in preparation, patient care, nutritional counseling and documentation for this visit, including the following: review of clinical lab tests; review of medical tests/procedures/services.      Kris Burd, PA-C

## 2022-06-04 NOTE — Assessment & Plan Note (Signed)
Vitamin D Deficiency Vitamin D is not at goal of 50.  Most recent vitamin D level was 29.8. She is on  prescription ergocalciferol 50,000 IU weekly. Lab Results  Component Value Date   VD25OH 29.8 (L) 05/14/2022   VD25OH 21.8 (L) 02/12/2022   VD25OH 27.3 (L) 02/15/2021    Plan: Refill prescription vitamin D 50,000 IU weekly. Low vitamin D levels can be associated with adiposity and may result in leptin resistance and weight gain. Also associated with fatigue. Currently on vitamin D supplementation without any adverse effects.

## 2022-06-04 NOTE — Assessment & Plan Note (Signed)
Insulin Resistance Last fasting insulin was 12.9- much improved . A1c was 5.0. Polyphagia:No Medication(s): None She did not start metformin and would like to hold off on medications for now. She is working on Camera operator to decrease simple carbohydrates, increase lean proteins and exercise to promote weight loss, improve glycemic control and prevent progression to Type 2 diabetes.   Lab Results  Component Value Date   HGBA1C 5.0 05/14/2022   HGBA1C 5.2 06/18/2021   HGBA1C 5.1 02/15/2021   HGBA1C 5.0 08/24/2020   HGBA1C 4.8 05/29/2020   Lab Results  Component Value Date   INSULIN 12.9 05/14/2022   INSULIN 23.5 08/24/2020    Plan:  Continue working on nutrition plan to decrease simple carbohydrates, increase lean proteins and exercise to promote weight loss, improve glycemic control and prevent progression to Type 2 diabetes.  Increase protein intake. Increase fiber intake.

## 2022-06-04 NOTE — Assessment & Plan Note (Signed)
Elevated blood pressure without diagnosis of hypertension Blood pressure is noted to be borderline elevated today, but normal in the past. [name] denies chest pains or SOB. She reports was briefly on HCTZ following the birth of one of her children and had pre-eclampsia then, but no recent medications.  She is working on Camera operator to promote weight loss and improve BP. We discussed decreasing sodium/salt in diet and will monitor.  She would like to avoid medication if possible.  BP Readings from Last 3 Encounters:  06/04/22 (!) 132/90  05/14/22 (!) 146/82  05/14/22 134/83    Plan: Continue to monitor.  If blood pressure continues to be elevated at future office visits, will consider starting antihypertensive medication.

## 2022-06-11 ENCOUNTER — Ambulatory Visit: Payer: Medicaid Other | Attending: Family Medicine | Admitting: Physical Therapy

## 2022-06-14 ENCOUNTER — Ambulatory Visit (INDEPENDENT_AMBULATORY_CARE_PROVIDER_SITE_OTHER): Payer: Medicaid Other | Admitting: Clinical

## 2022-06-14 ENCOUNTER — Encounter (HOSPITAL_COMMUNITY): Payer: Self-pay | Admitting: Clinical

## 2022-06-14 DIAGNOSIS — F411 Generalized anxiety disorder: Secondary | ICD-10-CM

## 2022-06-14 DIAGNOSIS — F4310 Post-traumatic stress disorder, unspecified: Secondary | ICD-10-CM | POA: Diagnosis not present
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
## 2022-06-20 ENCOUNTER — Ambulatory Visit: Payer: Medicaid Other

## 2022-06-28 ENCOUNTER — Ambulatory Visit (INDEPENDENT_AMBULATORY_CARE_PROVIDER_SITE_OTHER): Payer: Medicaid Other | Admitting: Student

## 2022-06-28 VITALS — BP 128/80 | HR 73 | Wt 291.0 lb

## 2022-06-28 DIAGNOSIS — S30861A Insect bite (nonvenomous) of abdominal wall, initial encounter: Secondary | ICD-10-CM | POA: Diagnosis not present

## 2022-06-28 DIAGNOSIS — W57XXXA Bitten or stung by nonvenomous insect and other nonvenomous arthropods, initial encounter: Secondary | ICD-10-CM

## 2022-06-28 MED ORDER — DOXYCYCLINE HYCLATE 100 MG PO TABS: 100.0000 mg | ORAL_TABLET | Freq: Two times a day (BID) | ORAL | 0 refills | Status: DC

## 2022-06-28 MED ORDER — DOXYCYCLINE HYCLATE 100 MG PO TABS: 100.0000 mg | ORAL_TABLET | Freq: Two times a day (BID) | ORAL | 0 refills | Status: AC

## 2022-06-28 NOTE — Progress Notes (Unsigned)
  SUBJECTIVE:   CHIEF COMPLAINT / HPI:   Tick bite on stomach and HA  Had tick on stomach that she noticed on Tuesday, went and removed it that day. Has been having nausea and headaches since tick bite. Lesion on stomach started as small point and has gotten bigger and red. Has been afebrile, and denies body aches, or malaise   PERTINENT  PMH / PSH:   Patient Care Team: Alfredo Martinez, MD as PCP - General (Family Medicine) OBJECTIVE:  BP 128/80   Pulse 73   Wt 291 lb (132 kg)   LMP 09/29/2019   SpO2 98%   BMI 44.25 kg/m  Physical Exam Constitutional:      General: She is not in acute distress.    Appearance: Normal appearance. She is not ill-appearing, toxic-appearing or diaphoretic.  Cardiovascular:     Rate and Rhythm: Normal rate and regular rhythm.     Heart sounds: No murmur heard.    No friction rub. No gallop.  Pulmonary:     Effort: Pulmonary effort is normal. No respiratory distress.     Breath sounds: Normal breath sounds. No stridor. No wheezing, rhonchi or rales.  Abdominal:     General: There is no distension.     Palpations: Abdomen is soft.     Tenderness: There is abdominal tenderness.  Skin:    Findings: Erythema and lesion (well cicrumcscribed lelsion w/ erythema) present. No abscess or ecchymosis.  Neurological:     Mental Status: She is alert.      ASSESSMENT/PLAN:  Tick bite of abdomen, initial encounter Assessment & Plan: Patient presents w/ lesion on stomach after tick bite, lesion appeared Tuesday and has gotten bigger over time. Patient denies any fever or body aches, but does report head aches and TTP w/ heat overlying lesion. Patient showed my photos of progression, some concern for targetoid/bullseye rash with central point of erythema and clearing around it. Given low levels of Lyme disease in this area, but concern for tick born illness, and possibly lyme, will treat w/ abx. Patient most likely suffering from reaction from the tick bite,  patient has headache but lacks malaise and fevers more concerning for tick born illness.  -doxycycline 100 mg BID x 10 days -f/u prn   Other orders -     Doxycycline Hyclate; Take 1 tablet (100 mg total) by mouth 2 (two) times daily for 10 days.  Dispense: 20 tablet; Refill: 0   No follow-ups on file. Bess Kinds, MD 06/30/2022, 7:58 AM PGY-2, [facility]

## 2022-06-28 NOTE — Patient Instructions (Signed)
It was great to see you! Thank you for allowing me to participate in your care!  This looks like an infection from the tick bite, but may not be Lyme disease, the rash doesn't quite fit. We will treat w/ antibiotics.  Our plans for today:  - Doxycycline 100 mg, twice a day, for 7 days - No need for follow unless rash getting bigger/worse/more painful, or unless developing more symptoms   Take care and seek immediate care sooner if you develop any concerns.   Dr. Bess Kinds, MD [facility]

## 2022-06-30 DIAGNOSIS — W57XXXA Bitten or stung by nonvenomous insect and other nonvenomous arthropods, initial encounter: Secondary | ICD-10-CM | POA: Insufficient documentation

## 2022-06-30 DIAGNOSIS — S30861A Insect bite (nonvenomous) of abdominal wall, initial encounter: Secondary | ICD-10-CM | POA: Insufficient documentation

## 2022-06-30 NOTE — Assessment & Plan Note (Signed)
Patient presents w/ lesion on stomach after tick bite, lesion appeared Tuesday and has gotten bigger over time. Patient denies any fever or body aches, but does report head aches and TTP w/ heat overlying lesion. Patient showed my photos of progression, some concern for targetoid/bullseye rash with central point of erythema and clearing around it. Given low levels of Lyme disease in this area, but concern for tick born illness, and possibly lyme, will treat w/ abx. Patient most likely suffering from reaction from the tick bite, patient has headache but lacks malaise and fevers more concerning for tick born illness.  -doxycycline 100 mg BID x 10 days -f/u prn

## 2022-07-02 ENCOUNTER — Encounter (INDEPENDENT_AMBULATORY_CARE_PROVIDER_SITE_OTHER): Payer: Self-pay | Admitting: Physician Assistant

## 2022-07-02 ENCOUNTER — Ambulatory Visit (INDEPENDENT_AMBULATORY_CARE_PROVIDER_SITE_OTHER): Payer: Medicaid Other | Admitting: Physician Assistant

## 2022-07-02 VITALS — BP 129/84 | HR 78 | Temp 97.7°F | Ht 68.0 in | Wt 287.0 lb

## 2022-07-02 DIAGNOSIS — Z6841 Body Mass Index (BMI) 40.0 and over, adult: Secondary | ICD-10-CM

## 2022-07-02 DIAGNOSIS — E88819 Insulin resistance, unspecified: Secondary | ICD-10-CM

## 2022-07-02 DIAGNOSIS — E559 Vitamin D deficiency, unspecified: Secondary | ICD-10-CM | POA: Diagnosis not present

## 2022-07-02 DIAGNOSIS — R030 Elevated blood-pressure reading, without diagnosis of hypertension: Secondary | ICD-10-CM | POA: Diagnosis not present

## 2022-07-02 MED ORDER — VITAMIN B 12 500 MCG PO TABS: 500.0000 ug | ORAL_TABLET | Freq: Every day | ORAL | 0 refills | Status: AC

## 2022-07-02 MED ORDER — VITAMIN D (ERGOCALCIFEROL) 1.25 MG (50000 UNIT) PO CAPS: ORAL_CAPSULE | ORAL | 0 refills | Status: DC

## 2022-07-02 NOTE — Assessment & Plan Note (Signed)
Elevated BP reading without diagnosis of hypertension:  BP better over the last visits.  Working on Engineer, technical sales and exercise to improve BP and promote weight loss.  Medication(s): None  BP Readings from Last 3 Encounters:  07/02/22 129/84  06/28/22 128/80  06/04/22 (!) 132/90   Lab Results  Component Value Date   CREATININE 0.67 05/14/2022   CREATININE 0.72 03/06/2022   CREATININE 0.69 06/18/2021   No results found for: "GFR"  Plan: Continue to work on nutrition plan to promote weight loss and improve BP control.

## 2022-07-02 NOTE — Assessment & Plan Note (Signed)
Insulin Resistance Last fasting insulin was 12.9. A1c was 5.0. Polyphagia:No Medication(s): None She is working on nutrition plan to decrease simple carbohydrates, increase lean proteins and exercise to promote weight loss, improve glycemic control and prevent progression to Type 2 diabetes.   Lab Results  Component Value Date   HGBA1C 5.0 05/14/2022   HGBA1C 5.2 06/18/2021   HGBA1C 5.1 02/15/2021   HGBA1C 5.0 08/24/2020   HGBA1C 4.8 05/29/2020   Lab Results  Component Value Date   INSULIN 12.9 05/14/2022   INSULIN 23.5 08/24/2020    Plan:  Continue working on nutrition plan to decrease simple carbohydrates, increase lean proteins and exercise to promote weight loss, improve glycemic control and prevent progression to Type 2 diabetes.  Increase protein intake. Increase fiber intake.

## 2022-07-02 NOTE — Assessment & Plan Note (Signed)
Vitamin D Deficiency Vitamin D is not at goal of 50.  Most recent vitamin D level was 29.8. She is on  prescription ergocalciferol 50,000 IU weekly. No side effects. Not always remembering to take weekly.  Lab Results  Component Value Date   VD25OH 29.8 (L) 05/14/2022   VD25OH 21.8 (L) 02/12/2022   VD25OH 27.3 (L) 02/15/2021    Plan: Continue and refill  prescription ergocalciferol 50,000 IU weekly Low vitamin D levels can be associated with adiposity and may result in leptin resistance and weight gain. Also associated with fatigue. Currently on vitamin D supplementation without any adverse effects.

## 2022-07-02 NOTE — Progress Notes (Signed)
Office: [phone]  /  Fax: [phone]  WEIGHT SUMMARY AND BIOMETRICS  Vitals Temp: 97.7 F (36.5 C) BP: 129/84 Pulse Rate: 78 SpO2: 99 %   Anthropometric Measurements Height: 5\' 8"  (1.727 m) Weight: 287 lb (130.2 kg) BMI (Calculated): 43.65 Weight at Last Visit: 291lb Weight Lost Since Last Visit: 4lb Weight Gained Since Last Visit: 0 Starting Weight: 301lb Total Weight Loss (lbs): 12 lb (5.443 kg)   Body Composition  Body Fat %: 49.2 % Fat Mass (lbs): 141.4 lbs Muscle Mass (lbs): 138.8 lbs Total Body Water (lbs): 103.4 lbs Visceral Fat Rating : 14   Other Clinical Data Fasting: no Labs: no Today's Visit #: 5 Starting Date: 04/16/22     HPI  Chief Complaint: OBESITY  Mackenzie Gibson is here to discuss her progress with her obesity treatment plan. She is on the the Category 4 Plan and states she is following her eating plan approximately 90 % of the time. She states she is exercising by walking at work and the park daily.   Interval History:  Since last office visit she is down 4 lbs.  Good adherence to nutrition plan.  Hunger/appetite- moderate control. Focusing on avoiding sugars. Very rare regular soda.  Exercise- walking and increased activities with children.  Stopped smoking cigarettes March 31st, but is vaping a little at times.   Has son's 12th Birthday in June and discussed celebration eating strategies.  Using bullet blender to make some smoothies using protein shake to meet protein goals.  On doxycycline for tick bite of abdominal wall.    Pharmacotherapy: Never started metformin for prediabetes- (and does not want to start at this point)   PHYSICAL EXAM:  Blood pressure 129/84, pulse 78, temperature 97.7 F (36.5 C), height 5\' 8"  (1.727 m), weight 287 lb (130.2 kg), last menstrual period 09/29/2019, SpO2 99 %. Body mass index is 43.64 kg/m.  General: She is overweight, cooperative, alert, well developed, and in no acute  distress. Cardiovascular- HR 70's regular PSYCH: Has normal mood, affect and thought process.   Lungs: Normal breathing effort, no conversational dyspnea. Neuro: no focal deficits  DIAGNOSTIC DATA REVIEWED:  BMET    Component Value Date/Time   NA 141 05/14/2022 1019   K 4.7 05/14/2022 1019   CL 104 05/14/2022 1019   CO2 21 05/14/2022 1019   GLUCOSE 89 05/14/2022 1019   GLUCOSE 133 (H) 10/26/2019 1515   BUN 11 05/14/2022 1019   CREATININE 0.67 05/14/2022 1019   CREATININE 0.66 05/15/2015 1019   CALCIUM 9.2 05/14/2022 1019   GFRNONAA >60 10/26/2019 1515   GFRNONAA >89 05/15/2015 1019   GFRAA >60 10/26/2019 1515   GFRAA >89 05/15/2015 1019   Lab Results  Component Value Date   HGBA1C 5.0 05/14/2022   HGBA1C 4.8 07/27/2008   Lab Results  Component Value Date   INSULIN 12.9 05/14/2022   INSULIN 23.5 08/24/2020   Lab Results  Component Value Date   TSH 2.600 05/14/2022   CBC    Component Value Date/Time   WBC 9.4 05/14/2022 1019   WBC 15.2 (H) 10/26/2019 1515   RBC 5.26 05/14/2022 1019   RBC 4.74 10/26/2019 1515   HGB 15.2 05/14/2022 1019   HGB 14.6 06/28/2008 1529   HCT 45.7 05/14/2022 1019   HCT 42.1 06/28/2008 1529   PLT 233 05/14/2022 1019   MCV 87 05/14/2022 1019   MCV 79.3 (L) 06/28/2008 1529   MCH 28.9 05/14/2022 1019   MCH 27.2 10/26/2019 1515  MCHC 33.3 05/14/2022 1019   MCHC 32.8 10/26/2019 1515   RDW 13.0 05/14/2022 1019   RDW 13.8 06/28/2008 1529   Iron Studies No results found for: "IRON", "TIBC", "FERRITIN", "IRONPCTSAT" Lipid Panel     Component Value Date/Time   CHOL 108 05/14/2022 1019   TRIG 135 05/14/2022 1019   HDL 34 (L) 05/14/2022 1019   CHOLHDL 5.1 (H) 06/18/2021 1122   CHOLHDL 3.9 Ratio 03/07/2008 2215   VLDL 37 03/07/2008 2215   LDLCALC 50 05/14/2022 1019   Hepatic Function Panel     Component Value Date/Time   PROT 7.3 05/14/2022 1019   ALBUMIN 4.5 05/14/2022 1019   AST 14 05/14/2022 1019   ALT 21 05/14/2022 1019    ALKPHOS 67 05/14/2022 1019   BILITOT 0.3 05/14/2022 1019   BILIDIR 0.07 04/21/2018 1504      Component Value Date/Time   TSH 2.600 05/14/2022 1019   Nutritional Lab Results  Component Value Date   VD25OH 29.8 (L) 05/14/2022   VD25OH 21.8 (L) 02/12/2022   VD25OH 27.3 (L) 02/15/2021    ASSOCIATED CONDITIONS ADDRESSED TODAY  ASSESSMENT AND PLAN  Problem List Items Addressed This Visit     Morbid obesity (HCC)   Relevant Medications   Cyanocobalamin (VITAMIN B 12) 500 MCG TABS   ELEVATED BP READING WITHOUT DX HYPERTENSION    Elevated BP reading without diagnosis of hypertension:  BP better over the last visits.  Working on Engineer, technical sales and exercise to improve BP and promote weight loss.  Medication(s): None  BP Readings from Last 3 Encounters:  07/02/22 129/84  06/28/22 128/80  06/04/22 (!) 132/90   Lab Results  Component Value Date   CREATININE 0.67 05/14/2022   CREATININE 0.72 03/06/2022   CREATININE 0.69 06/18/2021  No results found for: "GFR"  Plan: Continue to work on nutrition plan to promote weight loss and improve BP control.         Vitamin D deficiency    Vitamin D Deficiency Vitamin D is not at goal of 50.  Most recent vitamin D level was 29.8. She is on  prescription ergocalciferol 50,000 IU weekly. No side effects. Not always remembering to take weekly.  Lab Results  Component Value Date   VD25OH 29.8 (L) 05/14/2022   VD25OH 21.8 (L) 02/12/2022   VD25OH 27.3 (L) 02/15/2021   Plan: Continue and refill  prescription ergocalciferol 50,000 IU weekly Low vitamin D levels can be associated with adiposity and may result in leptin resistance and weight gain. Also associated with fatigue. Currently on vitamin D supplementation without any adverse effects.         Relevant Medications   Vitamin D, Ergocalciferol, (DRISDOL) 1.25 MG (50000 UNIT) CAPS capsule   Insulin resistance - Primary    Insulin Resistance Last fasting insulin was 12.9. A1c was  5.0. Polyphagia:No Medication(s): None She is working on nutrition plan to decrease simple carbohydrates, increase lean proteins and exercise to promote weight loss, improve glycemic control and prevent progression to Type 2 diabetes.   Lab Results  Component Value Date   HGBA1C 5.0 05/14/2022   HGBA1C 5.2 06/18/2021   HGBA1C 5.1 02/15/2021   HGBA1C 5.0 08/24/2020   HGBA1C 4.8 05/29/2020   Lab Results  Component Value Date   INSULIN 12.9 05/14/2022   INSULIN 23.5 08/24/2020   Plan:  Continue working on nutrition plan to decrease simple carbohydrates, increase lean proteins and exercise to promote weight loss, improve glycemic control and prevent progression to Type  2 diabetes.  Increase protein intake. Increase fiber intake.        BMI 40.0-44.9, adult (HCC)   Current BMI 43.7 TBW loss of 4.7%   TREATMENT PLAN FOR OBESITY:  Recommended Dietary Goals  Morrisa is currently in the action stage of change. As such, her goal is to continue weight management plan. She has agreed to the Category 4 Plan.  Behavioral Intervention  We discussed the following Behavioral Modification Strategies today: increasing lean protein intake, decreasing simple carbohydrates , increasing vegetables, increasing lower glycemic fruits, increasing water intake, continue to practice mindfulness when eating, and planning for success.  Additional resources provided today: NA  Recommended Physical Activity Goals  Serrena has been advised to work up to 150 minutes of moderate intensity aerobic activity a week and strengthening exercises 2-3 times per week for cardiovascular health, weight loss maintenance and preservation of muscle mass.   She has agreed to Continue current level of physical activity    Pharmacotherapy We discussed various medication options to help [place] with her weight loss efforts and we both agreed to continue to work on nutritional and behavioral strategies to promote  weight loss.     Return in about 3 weeks (around 07/23/2022).Marland Kitchen She was informed of the importance of frequent follow up visits to maximize her success with intensive lifestyle modifications for her multiple health conditions.   ATTESTASTION STATEMENTS:  Reviewed by clinician on day of visit: allergies, medications, problem list, medical history, surgical history, family history, social history, and previous encounter notes.   I have personally spent 44 minutes total time today in preparation, patient care, nutritional counseling and documentation for this visit, including the following: review of clinical lab tests; review of medical tests/procedures/services.      Brent Taillon, PA-C

## 2022-07-10 ENCOUNTER — Telehealth (HOSPITAL_COMMUNITY): Payer: Self-pay | Admitting: Clinical

## 2022-07-10 ENCOUNTER — Ambulatory Visit (INDEPENDENT_AMBULATORY_CARE_PROVIDER_SITE_OTHER): Payer: Medicaid Other | Admitting: Clinical

## 2022-07-10 ENCOUNTER — Other Ambulatory Visit (HOSPITAL_COMMUNITY): Payer: Self-pay | Admitting: Psychiatry

## 2022-07-10 ENCOUNTER — Encounter (HOSPITAL_COMMUNITY): Payer: Self-pay | Admitting: Clinical

## 2022-07-10 DIAGNOSIS — F411 Generalized anxiety disorder: Secondary | ICD-10-CM

## 2022-07-10 DIAGNOSIS — F4310 Post-traumatic stress disorder, unspecified: Secondary | ICD-10-CM

## 2022-07-10 NOTE — Progress Notes (Signed)
THERAPIST PROGRESS NOTE  Session Time: 11:02am-12:02pm  Session #3  Virtual Visit via Video Note  I connected with Mackenzie Gibson on 07/10/22 at 11:00 AM EDT by a video enabled telemedicine application and verified that I am speaking with the correct person using two identifiers.  Location: Patient: home Provider: [facility] outpatient therapy office   I discussed the limitations of evaluation and management by telemedicine and the availability of in person appointments. The patient expressed understanding and agreed to proceed.  I discussed the assessment and treatment plan with the patient. The patient was provided an opportunity to ask questions and all were answered. The patient agreed with the plan and demonstrated an understanding of the instructions.   The patient was advised to call back or seek an in-person evaluation if the symptoms worsen or if the condition fails to improve as anticipated.  I provided 60 minutes of non-face-to-face time during this encounter.  [name], LCSW  Participation Level: Active  Behavioral Response: Casual Alert Anxious, Hopeless, and Irritable  Type of Therapy: Individual Therapy  Treatment Goals addressed:  LTG: Mackenzie "Chip Boer" will score less than 5 on the Generalized Anxiety Disorder 7 Scale (GAD-7)    STG: Mackenzie "Chip Boer" will practice problem solving skills 3 times per week for the next 4 weeks. LTG: Develop and implement effective coping skills to carry out normal responsibilities and participate constructively in relationships as evidenced by self-report of increased satisfaction   STG: Mackenzie "Chip Boer" will practice emotion regulation skills 5 time(s) per week for the next 26 week(s)   STG: Mackenzie "Chip Boer" will identify negative coping strategies that have been used to cope with the feelings associated with the trauma   STG: Mackenzie "Chip Boer" will verbalize an increased sense of mastery over PTSD symptoms by  using several techniques to cope with flashbacks, decrease the power of triggers, and decrease negative thinking   STG: Mackenzie "Chip Boer" will cooperate with treatment in an effort to reduce PHQ-9 assessment scores   ProgressTowards Goals: Progressing  Interventions: CBT, Solution Focused, and Supportive  Summary: Mackenzie Gibson is a 37 y.o. female who presents with PTSD and GAD.  She reported that she started a new job at a different [employer] yesterday, likes it so far and appreciates that it will pay weekly and thus be more helpful in paying her bills, but is also scared what will happen if she gets fired.  Much of this session was spent talking about her various fears, many of which are well-founded.  She stated her money situation continues to be quite bad, with her car 3 months behind, and her electricity company demanding an $1800 payment or they will be cutting it off.  She has been to a variety of organizations seeking assistance and been turned down.  She received much support and kudos for how much she has done to try to solve this problem.    One roommate who paid rent moved out, while another remains without paying rent, but that one does help with the children while patient is working.  Teachers are talking about retaining her daughter in 1st grade and she feels guilty for not having time to work with her daughter, but thinks if she can work with her during mornings over the summer, she may not have to be retained.  Her children have been sick.  She stated she does not have to worry about the children eating because she receives $920 in Food Stamps monthly and by  purchasing cheap food, that will last.  She has a new boyfriend who lives 3 hours away and the children have been better-behaved in the last month with this new relationship.  Right now she and the boyfriend are taking a 3-week break to each take care of their financial issues, but she reports being happy in the  relationship and happier overall.  On the other hand, she also feels like a failure as a mother because she is mentally drained and physically tired of all this financial strain.  She did say that she has now lost 35   She did share that she ran out of her Lexapro and has not taken it for 2 weeks.  She also stated that she has had 8 panic attacks so far in May.  These occur mostly at night and will happen even when she is doing relaxing things such as taking a hot bath.  CSW reached out via secure chat to her psychiatrist and the front desk.  She was then informed that a refill had been called in previously; she was not aware of this but now can go pick that up.  An appointment for 2 days from now for a medication check was made and shared with her.  When asked to fill in the blank of "I am ______," she stated she is happier, blessed, stressed, overwhelmed, drowning when panicked, conflicted, stuck, and worried constantly.  The Thought Stopping technique was taught and she committed to trying to use it.  The Serenity Prayer, as her faith is important to her, was discussed and how to use it to sort out what she can change and what is out of her control.  She stated she is worried about the upcoming trial of her ex, which is supposed to be in June.  CSW once again pointed this back to the category of determining what she can control and what she cannot.  She stated that she feels like she gets better then gets worse, that she was in the car recently "bawling my eyes out," and that she often goes from 0 to 100 in a moment.  She is having what she feels are severe memory issues and was reassured that this is natural, given all that she is dealing with currently.  Suicidal/Homicidal: No  Therapist Response: Patient is making progress in opening up, and in being willing to hear about coping techniques rather than just narrate her fears and problems.  CSW provided mood monitoring and treatment progress review in  the context of this episode of treatment.   Patient reported that her mood has been "better then worse".   Patient was able to explore how she has done well in many ways in dealing with all her stressors and how she may improve even more with the use of specific coping techniques.    CSW gave patient the opportunity to explore thoughts and feelings associated with current life situations and past/present external stressors as desired.   CSW encouraged patient's expression of feelings and validated patient's thoughts, using empathy, active listening, open body language, and unconditional positive regard.  Patient demonstrated an orientation to time, place, person and situation.       Recommendations:  Return to therapy in 2 weeks, engage in self care behaviors  Plan: Return again in 2 weeks.  Diagnosis:  GAD (generalized anxiety disorder)  PTSD (post-traumatic stress disorder)  Collaboration of Care: Psychiatrist AEB - secure chat with psychiatrist and front desk to schedule virtual  visit to talk about medicines in 2 days, informed patient psychiatrist called in prescription at last visit so if she has not picked up, should do so  Patient/Guardian was advised Release of Information must be obtained prior to any record release in order to collaborate their care with an outside provider. Patient/Guardian was advised if they have not already done so to contact the registration department to sign all necessary forms in order for [name] to release information regarding their care.   Consent: Patient/Guardian gives verbal consent for treatment and assignment of benefits for services provided during this visit. Patient/Guardian expressed understanding and agreed to proceed.   [name], LCSW 07/10/2022

## 2022-07-12 ENCOUNTER — Encounter (HOSPITAL_COMMUNITY): Payer: Self-pay

## 2022-07-12 ENCOUNTER — Encounter (HOSPITAL_COMMUNITY): Payer: Medicaid Other | Admitting: Psychiatry

## 2022-07-12 NOTE — Progress Notes (Signed)
This encounter was created in error - please disregard.

## 2022-07-22 ENCOUNTER — Encounter (HOSPITAL_COMMUNITY): Payer: Self-pay | Admitting: Clinical

## 2022-07-22 ENCOUNTER — Ambulatory Visit (INDEPENDENT_AMBULATORY_CARE_PROVIDER_SITE_OTHER): Payer: Medicaid Other | Admitting: Clinical

## 2022-07-22 DIAGNOSIS — F411 Generalized anxiety disorder: Secondary | ICD-10-CM | POA: Diagnosis not present

## 2022-07-22 DIAGNOSIS — F4310 Post-traumatic stress disorder, unspecified: Secondary | ICD-10-CM
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
## 2022-07-23 ENCOUNTER — Ambulatory Visit (INDEPENDENT_AMBULATORY_CARE_PROVIDER_SITE_OTHER): Payer: Medicaid Other | Admitting: Physician Assistant

## 2022-07-23 DIAGNOSIS — E559 Vitamin D deficiency, unspecified: Secondary | ICD-10-CM

## 2022-07-23 DIAGNOSIS — E88819 Insulin resistance, unspecified: Secondary | ICD-10-CM

## 2022-07-23 DIAGNOSIS — R030 Elevated blood-pressure reading, without diagnosis of hypertension: Secondary | ICD-10-CM

## 2022-08-05 ENCOUNTER — Ambulatory Visit (INDEPENDENT_AMBULATORY_CARE_PROVIDER_SITE_OTHER): Payer: Medicaid Other | Admitting: Family Medicine

## 2022-08-12 ENCOUNTER — Ambulatory Visit (HOSPITAL_COMMUNITY): Payer: Self-pay | Admitting: Clinical

## 2022-08-12 ENCOUNTER — Encounter (HOSPITAL_COMMUNITY): Payer: Self-pay | Admitting: Clinical

## 2022-08-12 DIAGNOSIS — Z91199 Patient's noncompliance with other medical treatment and regimen due to unspecified reason: Secondary | ICD-10-CM

## 2022-08-12 NOTE — Progress Notes (Signed)
Patient ID: Mackenzie Gibson, female   DOB: November 08, 1985, 37 y.o.   MRN: [id number]  Therapy Progress Note  Patient had an appointment scheduled with therapist on 08/12/2022  at 10:00am.  When whe did not arrive into the virtual session, CSW sent her a text to [phone]  at 10:05am, an email to [email or web address] at 10:08am, a second text at 10:08, and a third text at 10:21am.  She still did not arrive for the session at 10:26am, so was considered a no show.  As per [facility] policy, she will be be charged for this no-show appointment.    Encounter Diagnosis  Name Primary?   No-show for appointment Yes     Ambrose Mantle, LCSW 08/12/2022, 10:22 AM

## 2022-08-17 ENCOUNTER — Other Ambulatory Visit (INDEPENDENT_AMBULATORY_CARE_PROVIDER_SITE_OTHER): Payer: Self-pay | Admitting: Physician Assistant

## 2022-09-02 ENCOUNTER — Encounter (HOSPITAL_COMMUNITY): Payer: Self-pay | Admitting: Clinical

## 2022-09-02 ENCOUNTER — Ambulatory Visit (INDEPENDENT_AMBULATORY_CARE_PROVIDER_SITE_OTHER): Payer: Medicaid Other | Admitting: Clinical

## 2022-09-02 DIAGNOSIS — F411 Generalized anxiety disorder: Secondary | ICD-10-CM | POA: Diagnosis not present

## 2022-09-02 DIAGNOSIS — F4310 Post-traumatic stress disorder, unspecified: Secondary | ICD-10-CM
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
## 2022-09-09 ENCOUNTER — Other Ambulatory Visit (INDEPENDENT_AMBULATORY_CARE_PROVIDER_SITE_OTHER): Payer: Self-pay | Admitting: Physician Assistant

## 2022-09-09 DIAGNOSIS — E559 Vitamin D deficiency, unspecified: Secondary | ICD-10-CM

## 2022-09-10 ENCOUNTER — Ambulatory Visit (INDEPENDENT_AMBULATORY_CARE_PROVIDER_SITE_OTHER): Payer: Medicaid Other | Admitting: Student

## 2022-09-10 DIAGNOSIS — M549 Dorsalgia, unspecified: Secondary | ICD-10-CM

## 2022-09-10 NOTE — Progress Notes (Unsigned)
  SUBJECTIVE:   CHIEF COMPLAINT / HPI:   Back Pain She reports back pain for ***. There {injury:31712} an injury that may have caused the pain. The most recent episode started {onset initial:119223} and is ***.  The pain is located in the ***.   It is described as ***, is *** in intensity, occurring.  Symptoms are aggravated by *** She has tried *** for treatment with *** relief    PERTINENT  PMH / PSH:  Anxiety, arthritis, bipolar disorder, depression, constipation, swallowing concerns, morbid obesity   Patient Care Team: Mackenzie Martinez, MD as PCP - General (Family Medicine) OBJECTIVE:  LMP 09/29/2019  Physical Exam  General: Alert and oriented in no apparent distress Heart: Regular rate and rhythm with no murmurs appreciated Lungs: CTA bilaterally, no wheezing Abdomen: Bowel sounds present, no abdominal pain Skin: Warm and dry Extremities: No lower extremity edema  ASSESSMENT/PLAN:  There are no diagnoses linked to this encounter.  - Continue with back exercises versus PT rx - Tylenol and Ibuprofen alternating for pain - Voltaren gel as needed  - No red flag symptoms to causes concern for neural impingement or cauda equina  - Return if symptoms persists after conservative treatment in 1-2 months   No follow-ups on file. Mackenzie Martinez, MD 09/10/2022, 12:21 PM PGY-3, [facility] {    This will disappear when note is signed, click to select method of visit    :1}

## 2022-09-11 NOTE — Progress Notes (Signed)
No show

## 2022-10-07 ENCOUNTER — Encounter (HOSPITAL_COMMUNITY): Payer: Self-pay | Admitting: Clinical

## 2022-10-07 ENCOUNTER — Ambulatory Visit (INDEPENDENT_AMBULATORY_CARE_PROVIDER_SITE_OTHER): Payer: Medicaid Other | Admitting: Clinical

## 2022-10-07 DIAGNOSIS — F4310 Post-traumatic stress disorder, unspecified: Secondary | ICD-10-CM

## 2022-10-07 DIAGNOSIS — F411 Generalized anxiety disorder: Secondary | ICD-10-CM | POA: Diagnosis not present
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
## 2022-10-09 DIAGNOSIS — R1110 Vomiting, unspecified: Secondary | ICD-10-CM | POA: Diagnosis not present

## 2022-10-09 DIAGNOSIS — R1030 Lower abdominal pain, unspecified: Secondary | ICD-10-CM | POA: Diagnosis not present

## 2022-10-09 DIAGNOSIS — Z20822 Contact with and (suspected) exposure to covid-19: Secondary | ICD-10-CM | POA: Diagnosis not present

## 2022-10-09 DIAGNOSIS — R509 Fever, unspecified: Secondary | ICD-10-CM | POA: Diagnosis not present

## 2022-10-09 DIAGNOSIS — R1013 Epigastric pain: Secondary | ICD-10-CM | POA: Diagnosis not present

## 2022-10-09 DIAGNOSIS — Z1152 Encounter for screening for COVID-19: Secondary | ICD-10-CM | POA: Diagnosis not present

## 2022-10-09 DIAGNOSIS — F17210 Nicotine dependence, cigarettes, uncomplicated: Secondary | ICD-10-CM | POA: Diagnosis not present

## 2022-10-09 DIAGNOSIS — R112 Nausea with vomiting, unspecified: Secondary | ICD-10-CM | POA: Diagnosis not present

## 2022-10-21 ENCOUNTER — Encounter (HOSPITAL_COMMUNITY): Payer: Self-pay | Admitting: Clinical

## 2022-10-21 ENCOUNTER — Ambulatory Visit (INDEPENDENT_AMBULATORY_CARE_PROVIDER_SITE_OTHER): Payer: Medicaid Other | Admitting: Clinical

## 2022-10-21 DIAGNOSIS — F4310 Post-traumatic stress disorder, unspecified: Secondary | ICD-10-CM

## 2022-10-21 DIAGNOSIS — F411 Generalized anxiety disorder: Secondary | ICD-10-CM | POA: Diagnosis not present
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
## 2022-10-25 DIAGNOSIS — R399 Unspecified symptoms and signs involving the genitourinary system: Secondary | ICD-10-CM | POA: Diagnosis not present

## 2022-10-25 DIAGNOSIS — R350 Frequency of micturition: Secondary | ICD-10-CM | POA: Diagnosis not present

## 2022-11-18 ENCOUNTER — Encounter: Payer: Self-pay | Admitting: Student

## 2022-11-18 ENCOUNTER — Other Ambulatory Visit: Payer: Self-pay | Admitting: Family Medicine

## 2022-11-18 ENCOUNTER — Ambulatory Visit: Payer: Medicaid Other | Admitting: Student

## 2022-11-18 VITALS — BP 140/97 | HR 78 | Ht 68.0 in | Wt 299.8 lb

## 2022-11-18 DIAGNOSIS — N63 Unspecified lump in unspecified breast: Secondary | ICD-10-CM

## 2022-11-18 DIAGNOSIS — I159 Secondary hypertension, unspecified: Secondary | ICD-10-CM | POA: Diagnosis not present

## 2022-11-18 DIAGNOSIS — Z6841 Body Mass Index (BMI) 40.0 and over, adult: Secondary | ICD-10-CM

## 2022-11-18 DIAGNOSIS — R519 Headache, unspecified: Secondary | ICD-10-CM

## 2022-11-18 DIAGNOSIS — G43009 Migraine without aura, not intractable, without status migrainosus: Secondary | ICD-10-CM

## 2022-11-18 DIAGNOSIS — Z7721 Contact with and (suspected) exposure to potentially hazardous body fluids: Secondary | ICD-10-CM | POA: Diagnosis not present

## 2022-11-18 MED ORDER — NAPROXEN 500 MG PO TABS
500.0000 mg | ORAL_TABLET | Freq: Two times a day (BID) | ORAL | 0 refills | Status: AC
Start: 2022-11-18 — End: 2022-11-23

## 2022-11-18 NOTE — Progress Notes (Unsigned)
    SUBJECTIVE:   CHIEF COMPLAINT / HPI:   Breast Lumps  -Reports that she has a couple of lumps underneath her breast.  -Has a history of H. suppurativa  -Does not feel that the lumps feel like HS nodules  -No drainage -Did feel them in the bath tub one day  -No fever or chills  -two lumps on underside of right breast   Headaches  -Has history of migraines, but does not feel as if this is the same  -Pain over right side of head  -No eye discharge, nasal drainage associated  -Had previously been present a year ago but ongoing headaches throughout the day waxing and waning in severity over past week  -Notes worsening with standing  -Took ibuprofen today but does feel relief from toradol in the past  -No vomiting or nausea  -No trauma   PERTINENT  PMH / PSH:  H/o HTN in pregnancy Migraine PCOS Insulin resistance  Eczema  H. Suppurativa Obesity  Panic disorder  Tobacco Use  HLD  GAD PTSD Elevated BMI    OBJECTIVE:   BP (!) 140/97   Pulse 78   Ht 5\' 8"  (1.727 m)   Wt 299 lb 12.8 oz (136 kg)   LMP 09/29/2019   SpO2 100%   BMI 45.58 kg/m   General: Alert and oriented in no apparent distress Heart: Regular rate and rhythm with no murmurs appreciated Lungs: No wheezing Skin: Warm and dry Breast exam: Felt over shirt under breast and unable to palpate nodules  No palpable axillary nodules  No drainage noted  On physical exam, patient lifted shirt and bra before I was able to leave the room to grab a CMA. Quick visualization of underside of breast showed old healing HS cystic regions without evidence of swelling, redness, drainage. Instructed to pull shirt back down Neuro: CN II: PERRL CN III, IV,VI: EOMI CV V: Normal sensation in V1, V2, V3 CVII: Symmetric smile and brow raise CN VIII: Normal hearing CN XI: 5/5 shoulder shrug CN XII: Symmetric tongue protrusion  UE and LE strength 5/5 Normal sensation in UE and LE bilaterally      ASSESSMENT/PLAN:    Assessment & Plan Exposure to blood-borne pathogen Request Hep C testing as she recently cleaned up blood of a roommate who had Hep C unbeknownst to her. Ordered  Breast nodule Unable to palpate any overt nodules but patient has great concern about this. May be hormonal, no evidence of infection. Will obtain U/S breast.  h/o Secondary hypertension ( w/ pregnancy) Elevated BP, patient left prior to ability to obtain a second pressure. Will recheck at next visit as I have her return in a few weeks.  Migraine without aura and without status migrainosus, not intractable Patient reports new headaches but reassuringly without overt red flag symptoms and neurologically intact. Will continue with Naprosyn x5 days with food and Tylenol routinely. Monitor symptoms. No evidence of CVA/TIA, not likely IIH given overall presentation but will monitor, may be related to social stressors at home as she has had a few more stressors than normal. Strict return precautions and monitor symptoms.  BMI 40.0-44.9, adult (HCC) A1C ordered on patient request      Alfredo Martinez, MD [facility]

## 2022-11-18 NOTE — Patient Instructions (Addendum)
It was great to see you today! Thank you for choosing [facility] for your primary care.  Today we addressed: We will get a breast ultrasound on the right breast  Please schedule an appointment.  You can call [phone].      For headaches--try naproxen twice a day for 5 days and take with food  Can also take tylenol during this time but not Aleve with the Naproxen   I will check the lab you asked for   I want you to return in 1 month   If you haven't already, sign up for My Chart to have easy access to your labs results, and communication with your primary care physician. We are checking some labs today. If they are abnormal, I will call you. If they are normal, I will send you a MyChart message (if it is active) or a letter in the mail. If you do not hear about your labs in the next 2 weeks, please call the office. I recommend that you always bring your medications to each appointment as this makes it easy to ensure you are on the correct medications and helps [name] not miss refills when you need them. Call the clinic at [phone] if your symptoms worsen or you have any concerns. Return in about 4 weeks (around 12/16/2022) for HA, HS. Please arrive 15 minutes before your appointment to ensure smooth check in process.  We appreciate your efforts in making this happen.  Thank you for allowing me to participate in your care, Alfredo Martinez, MD 11/18/2022, 4:04 PM PGY-3, [facility]

## 2022-11-19 ENCOUNTER — Encounter: Payer: Self-pay | Admitting: Student

## 2022-11-19 LAB — HEMOGLOBIN A1C
Est. average glucose Bld gHb Est-mCnc: 91 mg/dL
Hgb A1c MFr Bld: 4.8 % (ref 4.8–5.6)

## 2022-11-19 LAB — HEPATITIS C ANTIBODY: Hep C Virus Ab: NONREACTIVE

## 2022-11-20 ENCOUNTER — Encounter: Payer: Self-pay | Admitting: Student

## 2022-11-20 DIAGNOSIS — Z7721 Contact with and (suspected) exposure to potentially hazardous body fluids: Secondary | ICD-10-CM | POA: Insufficient documentation

## 2022-11-20 NOTE — Assessment & Plan Note (Addendum)
Elevated BP, patient left prior to ability to obtain a second pressure. Will recheck at next visit as I have her return in a few weeks.
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
## 2022-11-20 NOTE — Assessment & Plan Note (Addendum)
Patient reports new headaches but reassuringly without overt red flag symptoms and neurologically intact. Will continue with Naprosyn x5 days with food and Tylenol routinely. Monitor symptoms. No evidence of CVA/TIA, not likely IIH given overall presentation but will monitor, may be related to social stressors at home as she has had a few more stressors than normal. Strict return precautions and monitor symptoms.

## 2022-11-20 NOTE — Assessment & Plan Note (Signed)
A1C ordered on patient request

## 2022-11-25 ENCOUNTER — Ambulatory Visit
Admission: RE | Admit: 2022-11-25 | Discharge: 2022-11-25 | Disposition: A | Discharge status: Home / Self Care | Payer: Medicaid Other | Source: Ambulatory Visit | Attending: Family Medicine | Admitting: Family Medicine

## 2022-11-25 ENCOUNTER — Ambulatory Visit: Payer: Medicaid Other

## 2022-11-25 DIAGNOSIS — N63 Unspecified lump in unspecified breast: Secondary | ICD-10-CM

## 2022-12-05 ENCOUNTER — Ambulatory Visit (INDEPENDENT_AMBULATORY_CARE_PROVIDER_SITE_OTHER): Payer: Medicaid Other | Admitting: Clinical

## 2022-12-05 DIAGNOSIS — F4310 Post-traumatic stress disorder, unspecified: Secondary | ICD-10-CM | POA: Diagnosis not present

## 2022-12-05 DIAGNOSIS — F411 Generalized anxiety disorder: Secondary | ICD-10-CM | POA: Diagnosis not present

## 2022-12-05 NOTE — Progress Notes (Unsigned)
THERAPIST PROGRESS NOTE  Session Time: 8:01-9:01am  Session #8  Virtual Visit via Video Note  I connected with Mackenzie Gibson on 12/06/22 at  8:00 AM EDT by a video enabled telemedicine application and verified that I am speaking with the correct person using two identifiers.  Location: Patient: car, traveling Provider: [facility] outpatient therapy office   I discussed the limitations of evaluation and management by telemedicine and the availability of in person appointments. The patient expressed understanding and agreed to proceed.  I discussed the assessment and treatment plan with the patient. The patient was provided an opportunity to ask questions and all were answered. The patient agreed with the plan and demonstrated an understanding of the instructions.   The patient was advised to call back or seek an in-person evaluation if the symptoms worsen or if the condition fails to improve as anticipated.  I provided 60 minutes of non-face-to-face time during this encounter.  [name], LCSW  Participation Level: Active  Behavioral Response: Casual Alert Anxious, Euthymic, and Hopeless  Type of Therapy: Individual Therapy  Treatment Goals addressed: *** New treatment plan established:  Problem: Anxiety Goal: LTG: Mackenzie "Chip Boer" will score less than 5 on the Generalized Anxiety Disorder 7 Scale (GAD-7) Goal: STG: Mackenzie "Chip Boer" will practice problem solving skills 3 times per week for the next 4 weeks. Goal: LTG: Learn additional communication techniques that can assist with better navigating relationships. Goal: STG: Learn breathing techniques and grounding techniques at an age-appropriate level and demonstrate mastery in session then report independent use of these skills out of session. Intervention: Work with patient individually to identify the major components of a recent episode of anxiety: physical symptoms, major thoughts and images, and major  behaviors they experienced Intervention: Work with Mackenzie "Chip Boer" to identify 3 personal goals for managing their anxiety to work on during current treatment. Intervention: Work with Mackenzie "Chip Boer" to identify a minimum of 3 consequences of avoidance. Intervention: Work with Mackenzie "Chip Boer" to identify a minimum of 3 alternative coping behaviors to avoidance. Intervention: Instruct Mackenzie Spar "Chip Boer" on systematic desensitization and development of a hierarchy of feared situations in weekly individual session. Intervention: Perform motivational interviewing regarding using coping skills for anxiety, as  Intervention: Continue cognitive-behavioral therapy for trauma and anxiety, as appropriate and necessary Intervention: Educate patient on relaxation techniques and the rationale for learning these techniques (including breathing skills, grounding exercises, and mindfulness practice) Intervention: Provide psychoeducation on communication techniques such as "I" statements, open-ended questions, reflective listening, assertiveness, fair fighting rules, and more as needed.   Problem: BH CCP Acute or Chronic Trauma Reaction Goal: LTG: Develop and implement effective coping skills to carry out normal responsibilities and participate constructively in relationships as evidenced by self-report of increased satisfaction Goal: STG: Mackenzie "Chip Boer" will practice emotion regulation skills 5 time(s) per week for the next 26 week(s) Goal: STG: Mackenzie "Chip Boer" will identify negative coping strategies that have been used to cope with the feelings associated with the trauma Goal: STG: Mackenzie "Chip Boer" will verbalize an increased sense of mastery over PTSD symptoms by using several techniques to cope with flashbacks, decrease the power of triggers, and decrease negative thinking Goal: STG: Mackenzie "Chip Boer" will cooperate with treatment in an effort to reduce PHQ-9 assessment scores Goal: LTG: Explore personal core  beliefs, rules and assumptions, and cognitive distortions through therapist using Cognitive Behavioral Therapy; learn how to develop replacement thoughts and challenge unhelpful thoughts. Intervention: Work with Mackenzie "Chip Boer" to track symptoms, triggers, and/or skill use  through a mood chart, diary card, or journal Intervention: Cooperate with trauma-focused psychotherapy techniques to reduce emotional reaction to the traumatic event Intervention: Encourage Mackenzie "Chip Boer" to practice breathing retraining for 10 minutes, 2 times per day Intervention: Teach Mackenzie "Chip Boer" coping strategies (e.g., writing down thoughts and feelings in a journal; taking deep, slow breaths; calling a support person to talk about memories) to deal with trauma memories and sudden emotional reactions without becoming emot Intervention: Assign Mackenzie "Chip Boer" to read about other trauma survivors (e.g., holocaust victims or war veterans) and some of the coping strategies they use Intervention: Mackenzie "Chip Boer" will make a list of 5 distracting techniques, and practice using them Intervention: Educate Mackenzie "Chip Boer" on trauma influenced cognitive distortions Intervention: Work with Mackenzie "Chip Boer" to identify a minimum of 2 communication patterns that result in conflict with others and discuss with therapist during session Intervention: Use Cognitive Behavioral Therapy to explore patient's core beliefs, rules and assumptions, and cognitive distortions; teach how to develop replacement thoughts and challenge unhelpful thoughts.   ProgressTowards Goals: Progressing  Interventions: Solution Focused, Supportive, and Other: treatment planning  Summary: Mackenzie Gibson is a 37 y.o. female who presents with PTSD and GAD. ***  Suicidal/Homicidal: No without intent  Therapist Response:   Patient is progressing AEB engaging in scheduled therapy session.  She presented oriented x5 and stated she was feeling "***."  CSW  evaluated patient's self-care since last session.  She continues to NOT want to be on medicine.  CSW reviewed the last session with patient who reported ***.  Patient stated ***.  CSW taught CBT-related coping skills, specifically *** and ***.  Patient received these willingly and could discuss them in a way to indicate good comprehension.  CSW assigned patient the task of trying out these new coping skills (***) prior to next session on ***.  CSW encouraged patient to schedule more therapy sessions for the future, as ***  Throughout the session, CSW gave patient the opportunity to explore thoughts and feelings associated with current life situations and past/present external stressors.   CSW encouraged patient's expression of feelings and validated patient's thoughts using empathy, active listening, open body language, and unconditional positive regard.          Recommendations:  Return to therapy in 4 weeks, engage in self care behaviors more often, go over new copy that is being sent of fair fighting rules with family, re-teach 5-finger breathing to children, submit emotional support animal letter to landlord  Plan: Return again in 4 weeks.  Next appointment:  11/21  Diagnosis:  PTSD (post-traumatic stress disorder)  GAD (generalized anxiety disorder)  Collaboration of Care: Psychiatrist AEB - patient continues to not want medication at this time, is likely to need it in the future and will need to reschedule with psychiatrist  Patient/Guardian was advised Release of Information must be obtained prior to any record release in order to collaborate their care with an outside provider. Patient/Guardian was advised if they have not already done so to contact the registration department to sign all necessary forms in order for [name] to release information regarding their care.   Consent: Patient/Guardian gives verbal consent for treatment and assignment of benefits for services provided during this  visit. Patient/Guardian expressed understanding and agreed to proceed.   [name], LCSW 12/06/2022

## 2022-12-06 ENCOUNTER — Ambulatory Visit
Admission: RE | Admit: 2022-12-06 | Discharge: 2022-12-06 | Disposition: A | Discharge status: Home / Self Care | Payer: Medicaid Other | Source: Ambulatory Visit | Attending: Family Medicine | Admitting: Family Medicine

## 2022-12-06 ENCOUNTER — Encounter (HOSPITAL_COMMUNITY): Payer: Self-pay | Admitting: Clinical

## 2022-12-06 ENCOUNTER — Ambulatory Visit
Admission: RE | Admit: 2022-12-06 | Discharge: 2022-12-06 | Disposition: A | Discharge status: Home / Self Care | Payer: Medicaid Other | Source: Ambulatory Visit

## 2022-12-06 ENCOUNTER — Other Ambulatory Visit: Payer: Medicaid Other

## 2022-12-06 DIAGNOSIS — N63 Unspecified lump in unspecified breast: Secondary | ICD-10-CM

## 2022-12-06 DIAGNOSIS — N6011 Diffuse cystic mastopathy of right breast: Secondary | ICD-10-CM | POA: Diagnosis not present

## 2022-12-06 DIAGNOSIS — N641 Fat necrosis of breast: Secondary | ICD-10-CM | POA: Diagnosis not present

## 2022-12-09 DIAGNOSIS — H5213 Myopia, bilateral: Secondary | ICD-10-CM | POA: Diagnosis not present

## 2022-12-26 DIAGNOSIS — M778 Other enthesopathies, not elsewhere classified: Secondary | ICD-10-CM | POA: Diagnosis not present

## 2023-01-23 ENCOUNTER — Ambulatory Visit (HOSPITAL_COMMUNITY): Payer: Medicaid Other | Admitting: Clinical

## 2023-02-06 ENCOUNTER — Ambulatory Visit (HOSPITAL_COMMUNITY): Payer: Medicaid Other | Admitting: Clinical

## 2023-02-06 ENCOUNTER — Encounter (HOSPITAL_COMMUNITY): Payer: Self-pay | Admitting: Clinical

## 2023-02-06 DIAGNOSIS — F411 Generalized anxiety disorder: Secondary | ICD-10-CM

## 2023-02-06 DIAGNOSIS — F4310 Post-traumatic stress disorder, unspecified: Secondary | ICD-10-CM
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
## 2023-02-14 DIAGNOSIS — R0602 Shortness of breath: Secondary | ICD-10-CM | POA: Diagnosis not present

## 2023-02-14 DIAGNOSIS — R0789 Other chest pain: Secondary | ICD-10-CM | POA: Diagnosis not present

## 2023-02-14 DIAGNOSIS — F419 Anxiety disorder, unspecified: Secondary | ICD-10-CM | POA: Diagnosis not present

## 2023-02-14 DIAGNOSIS — R002 Palpitations: Secondary | ICD-10-CM | POA: Diagnosis not present

## 2023-02-14 DIAGNOSIS — F17210 Nicotine dependence, cigarettes, uncomplicated: Secondary | ICD-10-CM | POA: Diagnosis not present

## 2023-02-20 ENCOUNTER — Ambulatory Visit (HOSPITAL_COMMUNITY): Payer: Medicaid Other | Admitting: Clinical

## 2023-02-20 ENCOUNTER — Encounter (HOSPITAL_COMMUNITY): Payer: Self-pay | Admitting: Clinical

## 2023-02-20 DIAGNOSIS — F4310 Post-traumatic stress disorder, unspecified: Secondary | ICD-10-CM

## 2023-02-20 DIAGNOSIS — F411 Generalized anxiety disorder: Secondary | ICD-10-CM | POA: Diagnosis not present
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
## 2023-03-03 DIAGNOSIS — R059 Cough, unspecified: Secondary | ICD-10-CM | POA: Diagnosis not present

## 2023-03-03 DIAGNOSIS — J209 Acute bronchitis, unspecified: Secondary | ICD-10-CM | POA: Diagnosis not present

## 2023-03-03 DIAGNOSIS — Z20822 Contact with and (suspected) exposure to covid-19: Secondary | ICD-10-CM | POA: Diagnosis not present

## 2023-03-12 DIAGNOSIS — M25462 Effusion, left knee: Secondary | ICD-10-CM | POA: Diagnosis not present

## 2023-03-12 DIAGNOSIS — J209 Acute bronchitis, unspecified: Secondary | ICD-10-CM | POA: Diagnosis not present

## 2023-03-12 DIAGNOSIS — M1712 Unilateral primary osteoarthritis, left knee: Secondary | ICD-10-CM | POA: Diagnosis not present

## 2023-03-12 DIAGNOSIS — M76892 Other specified enthesopathies of left lower limb, excluding foot: Secondary | ICD-10-CM | POA: Diagnosis not present

## 2023-04-04 ENCOUNTER — Ambulatory Visit (INDEPENDENT_AMBULATORY_CARE_PROVIDER_SITE_OTHER): Payer: Medicaid Other | Admitting: Clinical

## 2023-04-04 ENCOUNTER — Encounter (HOSPITAL_COMMUNITY): Payer: Self-pay | Admitting: Clinical

## 2023-04-04 DIAGNOSIS — F4310 Post-traumatic stress disorder, unspecified: Secondary | ICD-10-CM | POA: Diagnosis not present

## 2023-04-04 DIAGNOSIS — F411 Generalized anxiety disorder: Secondary | ICD-10-CM
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
## 2023-04-18 ENCOUNTER — Ambulatory Visit (INDEPENDENT_AMBULATORY_CARE_PROVIDER_SITE_OTHER): Payer: Medicaid Other | Admitting: Student

## 2023-04-18 ENCOUNTER — Ambulatory Visit: Payer: Medicaid Other | Attending: Family Medicine

## 2023-04-18 ENCOUNTER — Other Ambulatory Visit: Payer: Self-pay

## 2023-04-18 VITALS — BP 136/90 | HR 74 | Ht 68.0 in | Wt 296.0 lb

## 2023-04-18 DIAGNOSIS — R002 Palpitations: Secondary | ICD-10-CM | POA: Insufficient documentation

## 2023-04-18 DIAGNOSIS — E559 Vitamin D deficiency, unspecified: Secondary | ICD-10-CM

## 2023-04-18 NOTE — Progress Notes (Signed)
    SUBJECTIVE:   CHIEF COMPLAINT / HPI:   Palpitations Since December.  Symptoms started shortly after she leaned over a machine and got hit in the chest pretty hard at work.  She was thinking that this could be related.  She was seen in the ED at [facility] for atypical chest pain around that time and had a benign workup at that time.  Symptoms have persisted, she has palpitations near daily, usually in the early afternoon but not always.  These last for 10 to 30 seconds and are sometimes accompanied by some lightheadedness but never chest pain or difficulty breathing.  She does find that it triggers her anxiety/panic symptoms as well but does think that it is a distinct process. She does smoke and drinks "a lot" of caffeine. Denies heavy menstrual bleeding as she is status post hysterectomy.  OBJECTIVE:   BP (!) 136/90   Pulse 74   Ht 5\' 8"  (1.727 m)   Wt 296 lb (134.3 kg)   LMP 09/29/2019   SpO2 99%   BMI 45.01 kg/m   Gen: Well appearing, anxious Cardio: RRR without murmur Pulm: Normal WOB on RA, lungs clear anteriorly  Abd: non-distended Ext: Without edema or deformity   ASSESSMENT/PLAN:   Assessment & Plan Palpitations ECG today benign. NSR without evidence of arrhythmogenic focus.  I have the strong suspicion that her symptoms are secondary to nicotine and caffeine use.  However, she certainly warrants a more thorough workup.   -CBC, TSH, BMP, magnesium -14-day Zio patch Vitamin D deficiency Has been out of her supply of vitamin D for a while.  Requesting recheck of her levels. -Repeat vitamin D level today per patient request    J Dorothyann Gibbs, MD [facility]

## 2023-04-18 NOTE — Patient Instructions (Signed)
Mackenzie Gibson,  I doubt this is anything scary, but we will do our due diligence. I am going to check some labs today and then will also order a 14 day cardiac monitor. This will be mailed to you.  I recommend cutting back on both caffeine and nicotine.   Eliezer Mccoy, MD

## 2023-04-18 NOTE — Assessment & Plan Note (Signed)
Has been out of her supply of vitamin D for a while.  Requesting recheck of her levels. -Repeat vitamin D level today per patient request

## 2023-04-19 LAB — CBC
Hematocrit: 44.2 % (ref 34.0–46.6)
Hemoglobin: 14.8 g/dL (ref 11.1–15.9)
MCH: 29.1 pg (ref 26.6–33.0)
MCHC: 33.5 g/dL (ref 31.5–35.7)
MCV: 87 fL (ref 79–97)
Platelets: 239 10*3/uL (ref 150–450)
RBC: 5.08 x10E6/uL (ref 3.77–5.28)
RDW: 13 % (ref 11.7–15.4)
WBC: 10.7 10*3/uL (ref 3.4–10.8)

## 2023-04-19 LAB — BASIC METABOLIC PANEL
BUN/Creatinine Ratio: 15 (ref 9–23)
BUN: 12 mg/dL (ref 6–20)
CO2: 22 mmol/L (ref 20–29)
Calcium: 9.4 mg/dL (ref 8.7–10.2)
Chloride: 102 mmol/L (ref 96–106)
Creatinine, Ser: 0.79 mg/dL (ref 0.57–1.00)
Glucose: 80 mg/dL (ref 70–99)
Potassium: 4.8 mmol/L (ref 3.5–5.2)
Sodium: 136 mmol/L (ref 134–144)
eGFR: 98 mL/min/{1.73_m2} (ref >59)

## 2023-04-19 LAB — MAGNESIUM: Magnesium: 2.1 mg/dL (ref 1.6–2.3)

## 2023-04-19 LAB — TSH RFX ON ABNORMAL TO FREE T4: TSH: 3.64 u[IU]/mL (ref 0.450–4.500)

## 2023-04-19 LAB — VITAMIN D 25 HYDROXY (VIT D DEFICIENCY, FRACTURES): Vit D, 25-Hydroxy: 17.7 ng/mL — ABNORMAL LOW (ref 30.0–100.0)

## 2023-04-21 ENCOUNTER — Encounter: Payer: Self-pay | Admitting: Student

## 2023-04-21 MED ORDER — VITAMIN D (ERGOCALCIFEROL) 1.25 MG (50000 UNIT) PO CAPS: ORAL_CAPSULE | ORAL | 0 refills | Status: AC

## 2023-04-21 NOTE — Addendum Note (Signed)
Addended by: Darnelle Spangle B on: 04/21/2023 08:11 AM   Modules accepted: Orders

## 2023-04-21 NOTE — Progress Notes (Unsigned)
 Enrolled for [employer] to mail a ZIO XT long term holter monitor to the patients address on file.

## 2023-04-25 ENCOUNTER — Ambulatory Visit (HOSPITAL_COMMUNITY): Payer: Medicaid Other | Admitting: Clinical

## 2023-04-25 ENCOUNTER — Encounter (HOSPITAL_COMMUNITY): Payer: Self-pay | Admitting: Clinical

## 2023-04-25 DIAGNOSIS — F411 Generalized anxiety disorder: Secondary | ICD-10-CM | POA: Diagnosis not present

## 2023-04-25 DIAGNOSIS — F4310 Post-traumatic stress disorder, unspecified: Secondary | ICD-10-CM | POA: Diagnosis not present
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
## 2023-04-26 DIAGNOSIS — R002 Palpitations: Secondary | ICD-10-CM | POA: Diagnosis not present

## 2023-05-06 DIAGNOSIS — R519 Headache, unspecified: Secondary | ICD-10-CM | POA: Diagnosis not present

## 2023-05-06 DIAGNOSIS — Z20822 Contact with and (suspected) exposure to covid-19: Secondary | ICD-10-CM | POA: Diagnosis not present

## 2023-05-09 ENCOUNTER — Ambulatory Visit (INDEPENDENT_AMBULATORY_CARE_PROVIDER_SITE_OTHER): Payer: Medicaid Other | Admitting: Clinical

## 2023-05-09 DIAGNOSIS — Z91199 Patient's noncompliance with other medical treatment and regimen due to unspecified reason: Secondary | ICD-10-CM

## 2023-05-09 NOTE — Progress Notes (Signed)
  Mackenzie Gibson    CSW attempted to connect with patient for scheduled appointment via MyChart video text request x 2 and email request with no response; also attempted to connect via phone without success. CSW left message for patient to call office to reschedule therapy appointment.        Attempt 1: Text and email: 9:00am      Attempt 2: Text: 9:04am       Attempt 3: Phone call and HIPAA-compliant voicemail: 9:10am      Left video chat open until:  9:24am      Per [facility] policy, after multiple attempts to reach patient unsuccessfully at appointed time, visit will be coded as a no show.    Encounter Diagnosis  Name Primary?   No-show for appointment Yes       Ambrose Mantle, LCSW 05/09/2023, 9:24 AM

## 2023-05-19 DIAGNOSIS — K529 Noninfective gastroenteritis and colitis, unspecified: Secondary | ICD-10-CM | POA: Diagnosis not present

## 2023-05-21 ENCOUNTER — Encounter: Payer: Self-pay | Admitting: Student

## 2023-05-30 ENCOUNTER — Encounter: Payer: Self-pay | Admitting: Student

## 2023-05-30 ENCOUNTER — Ambulatory Visit (HOSPITAL_COMMUNITY): Payer: Medicaid Other | Admitting: Clinical

## 2023-05-30 ENCOUNTER — Encounter (HOSPITAL_COMMUNITY): Payer: Self-pay | Admitting: Clinical

## 2023-05-30 ENCOUNTER — Ambulatory Visit: Admitting: Student

## 2023-05-30 VITALS — BP 130/88 | HR 85 | Ht 68.0 in | Wt 298.5 lb

## 2023-05-30 DIAGNOSIS — R002 Palpitations: Secondary | ICD-10-CM | POA: Diagnosis not present

## 2023-05-30 DIAGNOSIS — F4310 Post-traumatic stress disorder, unspecified: Secondary | ICD-10-CM | POA: Diagnosis not present

## 2023-05-30 DIAGNOSIS — F411 Generalized anxiety disorder: Secondary | ICD-10-CM
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
## 2023-05-30 NOTE — Patient Instructions (Addendum)
 It was great to see you today! Thank you for choosing [facility] for your primary care.  Today we addressed: I have referred to cardiology  Await that call  Go to front and schedule lab visit   If you haven't already, sign up for My Chart to have easy access to your labs results, and communication with your primary care physician.   Please arrive 15 minutes before your appointment to ensure smooth check in process.  We appreciate your efforts in making this happen.  Thank you for allowing me to participate in your care, Alfredo Martinez, MD 05/30/2023, 2:28 PM PGY-3, [facility]

## 2023-05-30 NOTE — Progress Notes (Signed)
    SUBJECTIVE:   CHIEF COMPLAINT / HPI:   LTM Result Discussion:  Per Heart Monitor Results:  NSR with sinus brady (53/min) and sinus tachy (157/min), ave 91/min. No VT, SVT or atrial fib. No pauses or significant bradycardia. Symptoms associated with PVC's. Rare PVC's and PAC's (<1%)  - Per patient, is a smoker with a family history of heart disease, presents with concerns about her own health.  - She has experienced feelings of her heart dropping in her chest and racing, accompanied by dizziness and near-blackouts.  - She recently wore a long-term heart monitor, the results of which showed a wide range of heart rates.  - She is seeking further testing and a referral to a cardiologist for peace of mind.   PERTINENT  PMH / PSH: Reviewed   OBJECTIVE:   BP 130/88   Pulse 85   Ht 5\' 8"  (1.727 m)   Wt 298 lb 8 oz (135.4 kg)   LMP 09/29/2019   SpO2 100%   BMI 45.39 kg/m   General: Alert and oriented in no apparent distress Heart: Regular rate and rhythm with no murmurs appreciated Lungs: CTA bilaterally, no wheezing Abdomen:  no abdominal pain Skin: Warm and dry   ASSESSMENT/PLAN:   Assessment & Plan Heart palpitations Unknown etiology, reassured by heart monitor. Patient expresses concern, discussed all possibilities of fluctuating HR during the course of the day. She requests a cardiology referral with strong family history of cardiac disease although unsure specifics, needs fasting lipids.  - Cholesterol panel future order for lab visit - Cardiology referral      Alfredo Martinez, MD [facility]

## 2023-06-06 ENCOUNTER — Other Ambulatory Visit

## 2023-06-06 DIAGNOSIS — R002 Palpitations: Secondary | ICD-10-CM

## 2023-06-06 DIAGNOSIS — E559 Vitamin D deficiency, unspecified: Secondary | ICD-10-CM

## 2023-06-06 NOTE — Addendum Note (Signed)
 Addended by: Veronda Prude on: 06/06/2023 09:38 AM   Modules accepted: Orders

## 2023-06-07 LAB — LIPID PANEL
Chol/HDL Ratio: 4.1 ratio (ref 0.0–4.4)
Cholesterol, Total: 114 mg/dL (ref 100–199)
HDL: 28 mg/dL — ABNORMAL LOW (ref >39)
LDL Chol Calc (NIH): 62 mg/dL (ref 0–99)
Triglycerides: 136 mg/dL (ref 0–149)
VLDL Cholesterol Cal: 24 mg/dL (ref 5–40)

## 2023-06-07 LAB — VITAMIN D 25 HYDROXY (VIT D DEFICIENCY, FRACTURES): Vit D, 25-Hydroxy: 31.6 ng/mL (ref 30.0–100.0)

## 2023-06-08 ENCOUNTER — Encounter: Payer: Self-pay | Admitting: Student

## 2023-06-09 ENCOUNTER — Encounter: Payer: Self-pay | Admitting: Student

## 2023-07-04 ENCOUNTER — Ambulatory Visit (INDEPENDENT_AMBULATORY_CARE_PROVIDER_SITE_OTHER): Admitting: Clinical

## 2023-07-04 ENCOUNTER — Encounter (HOSPITAL_COMMUNITY): Payer: Self-pay | Admitting: Clinical

## 2023-07-04 DIAGNOSIS — F4310 Post-traumatic stress disorder, unspecified: Secondary | ICD-10-CM

## 2023-07-04 DIAGNOSIS — F411 Generalized anxiety disorder: Secondary | ICD-10-CM | POA: Diagnosis not present
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
## 2023-07-11 ENCOUNTER — Encounter (HOSPITAL_COMMUNITY): Payer: Self-pay | Admitting: Clinical

## 2023-07-11 ENCOUNTER — Ambulatory Visit (INDEPENDENT_AMBULATORY_CARE_PROVIDER_SITE_OTHER): Payer: Medicaid Other | Admitting: Clinical

## 2023-07-11 DIAGNOSIS — F411 Generalized anxiety disorder: Secondary | ICD-10-CM

## 2023-07-11 DIAGNOSIS — F4310 Post-traumatic stress disorder, unspecified: Secondary | ICD-10-CM

## 2023-07-11 NOTE — Progress Notes (Unsigned)
 THERAPIST PROGRESS NOTE  Session Time: 9:01-9:58am  Session #15  Virtual Visit via Video Note  I connected with Mackenzie Gibson on 12/05/22 at  9:00 AM EDT by a video enabled telemedicine application and verified that I am speaking with the correct person using two identifiers.  Location: Patient: home  Provider: [facility] outpatient therapy office   I discussed the limitations of evaluation and management by telemedicine and the availability of in person appointments. The patient expressed understanding and agreed to proceed.  I discussed the assessment and treatment plan with the patient. The patient was provided an opportunity to ask questions and all were answered. The patient agreed with the plan and demonstrated an understanding of the instructions.   The patient was advised to call back or seek an in-person evaluation if the symptoms worsen or if the condition fails to improve as anticipated.  I provided 57 minutes of non-face-to-face time during this encounter.  Ancel Kass, LCSW  Participation Level: Active  Behavioral Response: Casual Lethargic Euthymic   Type of Therapy: Individual Therapy  Treatment Goals addressed:   LTG: Develop and implement effective coping skills to carry out normal responsibilities and participate constructively in relationships as evidenced by self-report of increased satisfaction  STG: Mackenzie "Antoine Bathe" will verbalize an increased sense of mastery over PTSD symptoms by using several techniques to cope with flashbacks, decrease the power of triggers, and decrease negative thinking STG: Learn breathing techniques and grounding techniques at an age-appropriate level and demonstrate mastery in session then report independent use of these skills out of session. LTG: Explore personal core beliefs, rules and assumptions, and cognitive distortions through therapist using Cognitive Behavioral Therapy; learn how to develop replacement thoughts  and challenge unhelpful thoughts.  LTG: Score less than 9 on the PHQ-9 and less than 5 on the GAD-7 as evidenced by intermittent administration of the questionnaires to determine progress in managing depression and anxiety. (Anxiety) LTG: Learn and practice communication techniques such as active listening, "I" statements, open-ended questions, reflective listening, assertiveness, fair fighting rules, initiating conversations, and more as necessary and taught in session. STG: Learn emotion regulation strategies, distress tolerance skills, interpersonal effectiveness techniques, and mindfulness practices and use them in session and in life situations to improve results and satisfaction.  STG: Process life events to the extent needed so that will be able to move forward with various areas of life in a better frame of mind per self-report.    LTG: Learn about boundary styles and types, how to implement them, and how to enforce them so that feels more empowered and content with being able to maintain more helpful, appropriate boundaries in the future for a more balanced result.   LTG: Work to Arts development officer from models like CBT, Stages of Change, DBT, shame resilience theory, ACT, SFBT, MI, trauma-informed therapy and others to be able to manage mental health symptoms, AEB practicing out of session and reporting back.   ProgressTowards Goals: Progressing  Interventions: DBT, Assertiveness Training, and Supportive   Summary: Mackenzie Gibson is a 38 y.o. female who presents with PTSD and GAD. She presented oriented x5 and stated she was feeling "tired."  CSW evaluated patient's medication compliance, use of coping tools, and self-care, as applicable.  She provided an update on various aspects of her life that are normally discussed in therapy, including her new job, her housing, and her middle child's behavior.  She did not feel a need to discuss the recent trial, stated that she  has put it out of her  mind except for asking for an appeal.  She was very tired, having worked until 2:30am, then run out of gas on the way home, so not getting home until 4:00am.  She was excited to report that the landlord seems to have done permanent fixes on the plumbing so they have hot water and he also repaired the holes in her floor by putting down plywood, although this was apparently done with the necessary supports.  He told her that if she wants carpet, she will have to get it installed herself.  They continue to search for another place to live. CSW explored with her how various requests have now been met, once she started setting boundaries with her landlord, encouraged her to apply this same concept in other areas of her life where she is likely to also see positive results.  Patient continues to report heart palpitations, does not think they are symptoms of a panic attack because they occur randomly when she is not necessarily anxious.  We also explored various ideas for disciplining her middle child whose behavior is unacceptable much of the time.  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  CSW suggested the idea of extinguishing behavior by depriving it of attention, much like a skillet fire is extinguished by putting the lid on to keep out the oxygen  that would feed it.  He tore up a nerf gun in a recent incident and CSW recommended to mother that if it was another child's gun, he be required to replace it.  She plans to give him the money coming from court to cover the cost of his food during the trial period, so CSW suggested retaining the money out of that.  She stated that he does not care about this, that he always says he does not care about anything she does as punishment.  CSW worked with her to understand that this is likely a defense mechanism and not true at all.   CSW then taught only the first of the TIPP skills to  rapidly deescalate from emotional mind, particularly for her to use with her middle son during his meltdowns. T - change temperature rapidly with cold water on the face or neck in order to lower heart rate and deescalate emotions quickly while engaging the parasympathetic nervous system to relax the body  Suicidal/Homicidal: No without intent/plan  Therapist Response:   Patient is progressing AEB engaging in scheduled therapy session.  Throughout the session, CSW gave patient the opportunity to explore thoughts and feelings associated with current life situations and past/present stressors.   CSW challenged patient gently and appropriately to consider different ways of looking at reported issues. CSW encouraged patient's expression of feelings and validated these using empathy, active listening, open body language, and unconditional positive regard.   Additional appointments were set.      Plan/Recommendations:   Return to therapy at next scheduled appointment on 6/6, continue to stand up to landlord, use suggestions given in session for son such as the "T" in TIPP and having him to use his own money to restore his siblings their toys, attend to self-care  Diagnosis:  PTSD (post-traumatic stress disorder)  GAD (generalized anxiety disorder)  Collaboration of Care: Psychiatrist AEB - patient continues to not want medication at this time - states she is happier when she is on medicine but does not  feel it is necessary currently  Patient/Guardian was advised Release of Information must be obtained prior to any record release in order to collaborate their care with an outside provider. Patient/Guardian was advised if they have not already done so to contact the registration department to sign all necessary forms in order for us  to release information regarding their care.   Consent: Patient/Guardian gives verbal consent for treatment and assignment of benefits for services provided during this visit.  Patient/Guardian expressed understanding and agreed to proceed.   Ancel Kass, LCSW 12/05/2022

## 2023-07-25 ENCOUNTER — Ambulatory Visit (HOSPITAL_COMMUNITY): Payer: Medicaid Other | Admitting: Clinical

## 2023-08-02 DIAGNOSIS — A059 Bacterial foodborne intoxication, unspecified: Secondary | ICD-10-CM | POA: Diagnosis not present

## 2023-08-08 ENCOUNTER — Ambulatory Visit (INDEPENDENT_AMBULATORY_CARE_PROVIDER_SITE_OTHER): Admitting: Clinical

## 2023-08-08 DIAGNOSIS — Z91199 Patient's noncompliance with other medical treatment and regimen due to unspecified reason: Secondary | ICD-10-CM

## 2023-08-08 NOTE — Progress Notes (Signed)
 Mackenzie Gibson    CSW attempted to connect with patient for scheduled appointment via MyChart video text request x 2 and email request with no response.      Attempt 1: Text and email: 8:00am      Attempt 2: Text: 8:12am     Left video chat open until:  8:20am      Per [place] policy, after multiple attempts to reach patient unsuccessfully at appointed time, visit will be coded as a no show.    Encounter Diagnosis  Name Primary?   No-show for appointment Yes       [name], LCSW 08/08/2023, 8:22 AM

## 2023-08-12 ENCOUNTER — Encounter: Payer: Self-pay | Admitting: *Deleted

## 2023-08-22 ENCOUNTER — Encounter: Payer: Self-pay | Admitting: Internal Medicine

## 2023-08-22 ENCOUNTER — Ambulatory Visit (HOSPITAL_COMMUNITY): Admitting: Clinical

## 2023-08-22 ENCOUNTER — Ambulatory Visit: Attending: Internal Medicine | Admitting: Internal Medicine

## 2023-08-22 VITALS — BP 142/95 | HR 79 | Ht 68.0 in | Wt 293.0 lb

## 2023-08-22 DIAGNOSIS — R002 Palpitations: Secondary | ICD-10-CM | POA: Insufficient documentation

## 2023-08-22 DIAGNOSIS — Z91199 Patient's noncompliance with other medical treatment and regimen due to unspecified reason: Secondary | ICD-10-CM

## 2023-08-22 MED ORDER — METOPROLOL SUCCINATE ER 25 MG PO TB24: 25.0000 mg | ORAL_TABLET | Freq: Every day | ORAL | 3 refills | Status: AC

## 2023-08-22 NOTE — Patient Instructions (Signed)
 Medication Instructions:  Your physician has recommended you make the following change in your medication:   Start Toprol XL 25 mg Daily   *If you need a refill on your cardiac medications before your next appointment, please call your pharmacy*  Lab Work: NONE  If you have labs (blood work) drawn today and your tests are completely normal, you will receive your results only by: MyChart Message (if you have MyChart) OR A paper copy in the mail If you have any lab test that is abnormal or we need to change your treatment, we will call you to review the results.  Testing/Procedures: NONE   Follow-Up: At [facility], you and your health needs are our priority.  As part of our continuing mission to provide you with exceptional heart care, our providers are all part of one team.  This team includes your primary Cardiologist (physician) and Advanced Practice Providers or APPs (Physician Assistants and Nurse Practitioners) who all work together to provide you with the care you need, when you need it.  Your next appointment:   6 week(s)  Provider:   Ola Berger, MD or Woodfin Hays, PA-C    We recommend signing up for the patient portal called MyChart.  Sign up information is provided on this After Visit Summary.  MyChart is used to connect with patients for Virtual Visits (Telemedicine).  Patients are able to view lab/test results, encounter notes, upcoming appointments, etc.  Non-urgent messages can be sent to your provider as well.   To learn more about what you can do with MyChart, go to [email or web address].   Other Instructions Thank you for choosing [facility]!

## 2023-08-22 NOTE — Progress Notes (Signed)
 Cardiology Office Note   Date:  08/22/2023   ID:  Mackenzie, Gibson 02-01-1986, MRN [id number]  PCP:  Mackenzie Headland, MD  Cardiologist:   Ola Berger, MD   Pt referred for evaluation of palpitations and dizziness   History of Present Illness: Mackenzie Gibson is a 38 y.o. female with a history of hx of palpitations  She is followd by [facility].   She wore a monitor that showed NSR   Rates 53 to 157 bpm  Average HR 91 bpm   Rare PACs and rare PVCs.  Symptoms corrleated with PVCs  The pt has said she feels her heart dropping in her chest , skipping   She  says she has felt her heart skipping for about 1 year   Initially rare   Now more frequently  At least 1x per day    Feels like heart is  dropping     Was driving in December, going to work  Suddenly a sinking feeling developed   Felt like pulse dropped   Got dzzy  Couldn't catch breath   Was very brief  She recovered   Has not had since   She says she feels fine when heart is not racing  Works at [name]   Diet:   One coffee per day     Water, gatorade   Small coke    Breakfast:  Skips   LUnch:   Engineer, technical sales:  [employer]  Fish fillet at [employer]........................................................................................................................................................................................................................................................................................................................... Current Meds  Medication Sig   albuterol  (VENTOLIN  HFA) 108 (90 Base) MCG/ACT inhaler Inhale 2 puffs into the lungs every 6 (six) hours as needed for wheezing.   fluticasone  (FLONASE ) 50 MCG/ACT nasal spray Place 2 sprays into both nostrils daily.   lidocaine  (XYLOCAINE ) 5 % ointment Apply a small amount to heel area 3 or 4 times a day as needed   polyethylene glycol powder (GLYCOLAX /MIRALAX ) 17 GM/SCOOP powder Take 17 g by mouth daily. For clean out  put 8 capfuls in 32 oz of clear liquid and drink over the next 4 hours.   senna (SENOKOT) 8.6 MG TABS tablet Take 1 tablet (8.6 mg total) by mouth daily.     Allergies:   Hydrocodone   Past Medical History:  Diagnosis Date   Anxiety    Arthritis    Back pain    Bipolar disorder (HCC)    Bipolar disorder (HCC)    Chronic hypertension in pregnancy 03/02/2015   Constipation    Depression    Family history of adverse reaction to anesthesia    mom woke up during surgery    Gallbladder problem    Hidradenitis suppurativa    High blood pressure    HPV (human papilloma virus) infection    Joint pain    Morbid obesity (HCC)    PCOS (polycystic ovarian syndrome)    PONV (postoperative nausea and vomiting)    Prediabetes    Pregnancy induced hypertension    SOBOE (shortness of breath on exertion)    Swallowing difficulty    Vitamin D  deficiency     Past Surgical History:  Procedure Laterality Date   CHOLECYSTECTOMY     EYE SURGERY     TUBAL LIGATION Bilateral 03/03/2015   Procedure: POST PARTUM TUBAL LIGATION;  Surgeon: Rik Chasten, MD;  Location: [facility];  Service: Gynecology;  Laterality: Bilateral;   VAGINAL HYSTERECTOMY Bilateral 10/26/2019   Procedure: HYSTERECTOMY VAGINAL;  Surgeon:  Ervin, Michael L, MD;  Location: [facility];  Service: Gynecology;  Laterality: Bilateral;     Social History:  The patient  reports that she has been smoking cigarettes. She has been exposed to tobacco smoke. She has never used smokeless tobacco. She reports current alcohol use. She reports that she does not use drugs.   Family History:  The patient's family history includes Alcoholism in her mother; Anxiety disorder in her mother; Bipolar disorder in her maternal aunt and mother; Cancer in her mother; Cervical cancer in her maternal grandmother, mother, and sister; Depression in her father and mother; High blood pressure in her father; Obesity in her mother; Rheum arthritis  in her mother.    ROS:  Please see the history of present illness. All other systems are reviewed and  Negative to the above problem except as noted.    PHYSICAL EXAM: VS:  BP (!) 142/95 (BP Location: Left Arm, Cuff Size: Large)   Ht 5' 8 (1.727 m)   Wt 293 lb (132.9 kg)   LMP 09/29/2019   SpO2 97%   BMI 44.55 kg/m   GEN: Morbidly obese 38 yo in no acute  distress  HEENT: normal  Neck: no JVD, carotid bruits Cardiac: RRR; no murmurs  No LE  edema  Respiratory:  clear to auscultation bilaterally GI: obese   Nontender  MS: no deformity Moving all extremities     EKG:  EKG is ordered today  EKG   SR 79 bpm   Nonspecific ST changes    Lipid Panel    Component Value Date/Time   CHOL 114 06/06/2023 0953   TRIG 136 06/06/2023 0953   HDL 28 (L) 06/06/2023 0953   CHOLHDL 4.1 06/06/2023 0953   CHOLHDL 3.9 Ratio 03/07/2008 2215   VLDL 37 03/07/2008 2215   LDLCALC 62 06/06/2023 0953      Wt Readings from Last 3 Encounters:  08/22/23 293 lb (132.9 kg)  05/30/23 298 lb 8 oz (135.4 kg)  04/18/23 296 lb (134.3 kg)      ASSESSMENT AND PLAN:  1  Palpitations   PT with rare PVCs  She senses     I would recomm trial of Toprol XL 25 mg      Stay hydrated    Avoid caffeine  2  Dizziness  ONe episode   Occurred in setting of skips   Brief    Rx as noted above   Toprol XL   Hydrate  3  HTN  BP is a little high   Add Toprol   XL   Follow up in 6 wks   4  MEtabolics   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  The patient does not have concerns regarding medicines.  Signed, Ola Berger, MD  08/22/2023 2:43 PM    [facility] [address] [place], [place], [place]  [address] Phone: [phone]; Fax: [phone]

## 2023-08-22 NOTE — Progress Notes (Signed)
 Mackenzie Gibson    CSW attempted to connect with patient for scheduled appointment via MyChart video text request x 2 and email request with no response; also attempted to connect via phone without success. CSW left message for patient to call office to reschedule therapy appointment.        Attempt 1: Text and email: 8:03am      Attempt 2: Text: 8:10am       Attempt 3: Phone call and HIPAA-compliant voicemail: 8:16am      Left video chat open until:  8:23am      Per [place] policy, after multiple attempts to reach patient unsuccessfully at appointed time, visit will be coded as a no show.    Encounter Diagnosis  Name Primary?   No-show for appointment Yes       [name], LCSW 08/22/2023, 8:24 AM

## 2023-09-12 ENCOUNTER — Ambulatory Visit (INDEPENDENT_AMBULATORY_CARE_PROVIDER_SITE_OTHER): Admitting: Clinical

## 2023-09-12 ENCOUNTER — Encounter (HOSPITAL_COMMUNITY): Payer: Self-pay | Admitting: Clinical

## 2023-09-12 DIAGNOSIS — F4310 Post-traumatic stress disorder, unspecified: Secondary | ICD-10-CM | POA: Diagnosis not present

## 2023-09-12 DIAGNOSIS — F411 Generalized anxiety disorder: Secondary | ICD-10-CM | POA: Diagnosis not present

## 2023-09-12 NOTE — Progress Notes (Signed)
 THERAPIST PROGRESS NOTE  Session Time: 9:02-10:00am  Session #16  Virtual Visit via Video Note  I connected with Richerd JONELLE Menghini on 12/05/22 at  9:00 AM EDT by a video enabled telemedicine application and verified that I am speaking with the correct person using two identifiers.  Location: Patient: home  Provider: home office   I discussed the limitations of evaluation and management by telemedicine and the availability of in person appointments. The patient expressed understanding and agreed to proceed.  I discussed the assessment and treatment plan with the patient. The patient was provided an opportunity to ask questions and all were answered. The patient agreed with the plan and demonstrated an understanding of the instructions.   The patient was advised to call back or seek an in-person evaluation if the symptoms worsen or if the condition fails to improve as anticipated.  I provided 58 minutes of non-face-to-face time during this encounter.  Elgie JINNY Crest, LCSW  Participation Level: Active  Behavioral Response: Casual Lethargic Hopeless and Irritable   Type of Therapy: Individual Therapy  Treatment Goals addressed:   LTG: Develop and implement effective coping skills to carry out normal responsibilities and participate constructively in relationships as evidenced by self-report of increased satisfaction  STG: Dennette Faulconer will verbalize an increased sense of mastery over PTSD symptoms by using several techniques to cope with flashbacks, decrease the power of triggers, and decrease negative thinking STG: Learn breathing techniques and grounding techniques at an age-appropriate level and demonstrate mastery in session then report independent use of these skills out of session. LTG: Explore personal core beliefs, rules and assumptions, and cognitive distortions through therapist using Cognitive Behavioral Therapy; learn how to develop replacement thoughts and  challenge unhelpful thoughts.  LTG: Score less than 9 on the PHQ-9 and less than 5 on the GAD-7 as evidenced by intermittent administration of the questionnaires to determine progress in managing depression and anxiety. (Anxiety) LTG: Learn and practice communication techniques such as active listening, I statements, open-ended questions, reflective listening, assertiveness, fair fighting rules, initiating conversations, and more as necessary and taught in session. STG: Learn emotion regulation strategies, distress tolerance skills, interpersonal effectiveness techniques, and mindfulness practices and use them in session and in life situations to improve results and satisfaction.  STG: Process life events to the extent needed so that will be able to move forward with various areas of life in a better frame of mind per self-report.    LTG: Learn about boundary styles and types, how to implement them, and how to enforce them so that feels more empowered and content with being able to maintain more helpful, appropriate boundaries in the future for a more balanced result.   LTG: Work to Arts development officer from models like CBT, Stages of Change, DBT, shame resilience theory, ACT, SFBT, MI, trauma-informed therapy and others to be able to manage mental health symptoms, AEB practicing out of session and reporting back.   ProgressTowards Goals: Progressing  Interventions: Psychosocial Skills: Communication and Supportive   Summary: DAWNETTE MIONE is a 38 y.o. female who presents with PTSD and GAD. She presented oriented x5 and stated she was feeling up and down.  CSW evaluated patient's medication compliance, use of coping tools, and self-care, as applicable.  She provided an update on various aspects of her life that are normally discussed in therapy, including recent medication prescribed, issue with daughter that caused a neighbor to call the police, issues with fiance being immature, job problems,  issues with her children refusing to clean the home.  After heart testing/labs, she was prescribed a new medication but has decided not to take it, did not like the side effects reported.  She reported that she has not been having any heart palpitations or anxiety attacks lately.  She shared an incident that happened with her daughter and sons, a visit by the police, and the outcome.  Child Protective Services did not get involved.  As she continued to describe very unmanageable behavior by her 12yo son, she stated that she knows he needs in-home services, but she is afraid to have anyone in her home because of its extremely poor conditions with messes everywhere and roaches.  She fears CPS would be called immediately.  She talked about wanting to send him to a group home, has even threatened him with such.  CSW explained the levels of services and how a person has to go through various levels, cannot just start at the top.  Patient disclosed many problems she is having in relationship with fiance, feeling he is immature so she is actually raising 4 children in the home instead of just 3.  As she has explained many, many times she described her complete inability to get the children and fiance to do any chores around the house.  CSW has provided much parenting guidance to no avail, so just offered a listening ear this time and supported her when she said she might turn off the internet until they cooperate around the house.  CSW asked if she continues to use the I statements we have worked on, and she gave a variety of samples in which she starts with an I statement then devolves into you accusations.  CSW gently pointed this out and offered alternatives.  CSW expressed great concern for her health and wellbeing, particularly since she is working in a [employer] where the air conditioning is not working and her home is unhygienic.  She benefited from sharing what is happening in her life and the  acknowledgement that she is doing the best she can and things can/will improve with time.  Suicidal/Homicidal: No without intent/plan  Therapist Response:   Patient is progressing AEB engaging in scheduled therapy session.  Throughout the session, CSW gave patient the opportunity to explore thoughts and feelings associated with current life situations and past/present stressors.   CSW challenged patient gently and appropriately to consider different ways of looking at reported issues. CSW encouraged patient's expression of feelings and validated these using empathy, active listening, open body language, and unconditional positive regard.   Additional appointments were set.      Plan/Recommendations:   Return to therapy at next scheduled appointment on 7/25, turn off the internet in an effort to get cooperation from children and fiance  Diagnosis:  PTSD (post-traumatic stress disorder)  GAD (generalized anxiety disorder)  Collaboration of Care: Psychiatrist AEB - patient continues to not want medication at this time - states she is happier when she is on medicine but does not feel it is necessary currently  Patient/Guardian was advised Release of Information must be obtained prior to any record release in order to collaborate their care with an outside provider. Patient/Guardian was advised if they have not already done so to contact the registration department to sign all necessary forms in order for us  to release information regarding their care.   Consent: Patient/Guardian gives verbal consent for treatment and assignment of benefits for services provided during this visit. Patient/Guardian  expressed understanding and agreed to proceed.   Elgie JINNY Crest, LCSW 12/05/2022

## 2023-09-26 ENCOUNTER — Ambulatory Visit (INDEPENDENT_AMBULATORY_CARE_PROVIDER_SITE_OTHER): Admitting: Clinical

## 2023-09-26 ENCOUNTER — Encounter (HOSPITAL_COMMUNITY): Payer: Self-pay

## 2023-09-26 DIAGNOSIS — Z91199 Patient's noncompliance with other medical treatment and regimen due to unspecified reason: Secondary | ICD-10-CM

## 2023-09-26 NOTE — Progress Notes (Signed)
 Mackenzie Gibson    CSW attempted to connect with patient for scheduled appointment via MyChart video text request x 2 and email request with no response; also attempted to connect via phone without success. CSW left message for patient to call office to reschedule therapy appointment.        Attempt 1: Text and email: 9:02am      Attempt 2: Text: 9:07am       Attempt 3: Phone call and HIPAA-compliant voicemail: 9:15am      Left video chat open until:  9:16am      Per [facility] policy, after multiple attempts to reach patient unsuccessfully at appointed time, visit will be coded as a no show.    Encounter Diagnosis  Name Primary?   No-show for appointment Yes       Elgie Crest, LCSW 09/26/2023, 9:16 AM

## 2023-10-08 ENCOUNTER — Ambulatory Visit: Admitting: Student

## 2023-10-10 ENCOUNTER — Ambulatory Visit: Admitting: Internal Medicine

## 2023-10-15 DIAGNOSIS — S99921A Unspecified injury of right foot, initial encounter: Secondary | ICD-10-CM | POA: Diagnosis not present

## 2023-10-24 ENCOUNTER — Ambulatory Visit (HOSPITAL_COMMUNITY): Admitting: Clinical

## 2023-10-24 ENCOUNTER — Encounter (HOSPITAL_COMMUNITY): Payer: Self-pay | Admitting: Clinical

## 2023-10-24 DIAGNOSIS — F411 Generalized anxiety disorder: Secondary | ICD-10-CM | POA: Diagnosis not present

## 2023-10-24 DIAGNOSIS — F4310 Post-traumatic stress disorder, unspecified: Secondary | ICD-10-CM | POA: Diagnosis not present
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
## 2023-11-04 ENCOUNTER — Ambulatory Visit (INDEPENDENT_AMBULATORY_CARE_PROVIDER_SITE_OTHER): Admitting: Family Medicine

## 2023-11-04 VITALS — BP 137/89 | HR 80 | Ht 68.0 in | Wt 296.2 lb

## 2023-11-04 DIAGNOSIS — M7661 Achilles tendinitis, right leg: Secondary | ICD-10-CM | POA: Diagnosis not present

## 2023-11-04 DIAGNOSIS — G8929 Other chronic pain: Secondary | ICD-10-CM | POA: Diagnosis not present

## 2023-11-04 DIAGNOSIS — M25571 Pain in right ankle and joints of right foot: Secondary | ICD-10-CM

## 2023-11-04 DIAGNOSIS — E559 Vitamin D deficiency, unspecified: Secondary | ICD-10-CM

## 2023-11-04 DIAGNOSIS — Z23 Encounter for immunization: Secondary | ICD-10-CM

## 2023-11-04 MED ORDER — VITAMIN D (CHOLECALCIFEROL) 50 MCG (2000 UT) PO CAPS: 2.0000 | ORAL_CAPSULE | Freq: Every day | ORAL | 3 refills | Status: AC

## 2023-11-04 MED ORDER — NAPROXEN 500 MG PO TBEC
500.0000 mg | DELAYED_RELEASE_TABLET | Freq: Two times a day (BID) | ORAL | 0 refills | Status: AC
Start: 2023-11-04 — End: 2023-11-18

## 2023-11-04 NOTE — Assessment & Plan Note (Signed)
Refilled vitamin D per patient request.

## 2023-11-04 NOTE — Progress Notes (Signed)
    SUBJECTIVE:   CHIEF COMPLAINT / HPI: joint pain  Joint pain -pain in right ankle for years -worse in the morning -wears supportive shoes -on feet 8-10 hours per day -worse with standing up -pain located in heal of right foot -takes 800mg  ibuprofen  daily  PERTINENT  PMH / PSH: PCOS, Asthamtic bronchitis, Migraines, Eczema, GAD, PTSD  OBJECTIVE:   BP 137/89   Pulse 80   Ht 5' 8 (1.727 m)   Wt 296 lb 3.2 oz (134.4 kg)   LMP 09/29/2019   SpO2 99%   BMI 45.04 kg/m   General: NAD, well appearing Neuro: A&O Respiratory: normal WOB on RA Extremities: Moving all 4 extremities equally Right foot: pziegoneic papules present on medial aspect of heel, valgus of heel and midfoot, no obvious erythema or swelling, tender palpation at insertion of achilles, no TTP along joint line, some pain with plantar flexion, some pain with dorsiflexion. Strength and ROM normal  ASSESSMENT/PLAN:   Assessment & Plan Chronic pain of right ankle Achilles tendinitis of right lower extremity Consistent with Achilles tendinitis of right lower extremity.  Patient is at high risk given calcaneal enthesophyte.  May have some component of retrocalcaneal bursitis, however no obvious swelling or erythema. - Naprosyn  500 mg twice daily for 2 weeks - Achilles rehab exercises provided - Ambulatory referral to sports medicine for heel cups - Consider nitroglycerin  patch - Can try voltaren  gel as well - Follow-up 6 weeks Vitamin D  deficiency Refilled vitamin D  per patient request. Encounter for immunization Flu shot administered today.  Return in about 6 weeks (around 12/16/2023).  Ozell Provencal, MD [facility]

## 2023-11-04 NOTE — Patient Instructions (Addendum)
 It was great to see you! Thank you for allowing me to participate in your care!  Our plans for today:  - You have achilles tendinitis, I have attached exercises for you to start in 1 week. - I have ordered naproxen  to your pharmacy, you can take this twice daily for 2 weeks. - I have placed a referral to sports medicine, they should call you within in the next couple weeks to schedule an appointment.   Please arrive 15 minutes PRIOR to your next scheduled appointment time! If you do not, this affects OTHER patients' care.  Take care and seek immediate care sooner if you develop any concerns.   Ozell Provencal, MD, PGY-3 [facility] 10:53 AM 11/04/2023  [facility]

## 2023-11-06 ENCOUNTER — Ambulatory Visit

## 2023-11-06 DIAGNOSIS — Z20822 Contact with and (suspected) exposure to covid-19: Secondary | ICD-10-CM | POA: Diagnosis not present

## 2023-11-06 DIAGNOSIS — R062 Wheezing: Secondary | ICD-10-CM | POA: Diagnosis not present

## 2023-11-06 DIAGNOSIS — J069 Acute upper respiratory infection, unspecified: Secondary | ICD-10-CM | POA: Diagnosis not present

## 2023-11-07 ENCOUNTER — Encounter (HOSPITAL_COMMUNITY): Payer: Self-pay | Admitting: Clinical

## 2023-11-07 ENCOUNTER — Ambulatory Visit (HOSPITAL_COMMUNITY): Admitting: Clinical

## 2023-11-07 DIAGNOSIS — F4310 Post-traumatic stress disorder, unspecified: Secondary | ICD-10-CM

## 2023-11-07 DIAGNOSIS — F411 Generalized anxiety disorder: Secondary | ICD-10-CM | POA: Diagnosis not present
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
## 2023-11-12 ENCOUNTER — Ambulatory Visit

## 2023-11-17 DIAGNOSIS — J40 Bronchitis, not specified as acute or chronic: Secondary | ICD-10-CM | POA: Diagnosis not present

## 2023-11-25 NOTE — Progress Notes (Deleted)
   Cardiology Office Note    Date:  11/25/2023  ID:  Jaqlyn, Gruenhagen 1985/05/21, MRN [id number] Cardiologist: Vina Gull, MD { :  History of Present Illness:    Mackenzie Gibson is a 38 y.o. female with past medical history of palpitations who presents to the office today for follow-up.   She was examined by Dr. Gull in 08/2023 as a new patient referral and reported her HR was skipping beats at times which had been occurring for almost a year. Recent monitor had shows PVC's and it was recommended to start Toprol -XL 25mg  daily and reduce caffeine intake.   ROS: ***  Studies Reviewed:   EKG: EKG is*** ordered today and demonstrates ***   EKG Interpretation Date/Time:    Ventricular Rate:    PR Interval:    QRS Duration:    QT Interval:    QTC Calculation:   R Axis:      Text Interpretation:         Event Monitor: 05/2023 NSR with sinus brady (53/min) and sinus tachy (157/min), ave 91/min. No VT, SVT or atrial fib. No pauses or significant bradycardia. Symptoms associated with PVC's. Rare PVC's and PAC's (<1%) Patch Wear Time:  13 days and 18 hours (2025-02-22T13:11:22-0500 to 2025-03-08T07:41:30-0500)    Risk Assessment/Calculations:   {Does this patient have ATRIAL FIBRILLATION?:[id number]} No BP recorded.  {Refresh Note OR Click here to enter BP  :1}***         Physical Exam:   VS:  LMP 09/29/2019    Wt Readings from Last 3 Encounters:  11/04/23 296 lb 3.2 oz (134.4 kg)  08/22/23 293 lb (132.9 kg)  05/30/23 298 lb 8 oz (135.4 kg)     GEN: Well nourished, well developed in no acute distress NECK: No JVD; No carotid bruits CARDIAC: ***RRR, no murmurs, rubs, gallops RESPIRATORY:  Clear to auscultation without rales, wheezing or rhonchi  ABDOMEN: Appears non-distended. No obvious abdominal masses. EXTREMITIES: No clubbing or cyanosis. No edema.  Distal pedal pulses are 2+ bilaterally.   Assessment and Plan:      {Are you ordering a CV Procedure  (e.g. stress test, cath, DCCV, TEE, etc)?   Press F2        :[id number]}   Signed, Laymon CHRISTELLA Qua, PA-C

## 2023-11-26 ENCOUNTER — Ambulatory Visit: Attending: Student | Admitting: Student

## 2023-11-28 ENCOUNTER — Ambulatory Visit (INDEPENDENT_AMBULATORY_CARE_PROVIDER_SITE_OTHER): Admitting: Clinical

## 2023-11-28 ENCOUNTER — Encounter (HOSPITAL_COMMUNITY): Payer: Self-pay | Admitting: Clinical

## 2023-11-28 DIAGNOSIS — F4310 Post-traumatic stress disorder, unspecified: Secondary | ICD-10-CM

## 2023-11-28 DIAGNOSIS — F411 Generalized anxiety disorder: Secondary | ICD-10-CM

## 2023-11-28 NOTE — Progress Notes (Signed)
 THERAPIST PROGRESS NOTE  Session Time: 8:05-9:05am  Session #19  Virtual Visit via Video Note  I connected with Mackenzie Gibson on 12/05/22 at  8:00 AM EDT by a video enabled telemedicine application and verified that I am speaking with the correct person using two identifiers.  Location: Patient: home Provider: home office   I discussed the limitations of evaluation and management by telemedicine and the availability of in person appointments. The patient expressed understanding and agreed to proceed.  I discussed the assessment and treatment plan with the patient. The patient was provided an opportunity to ask questions and all were answered. The patient agreed with the plan and demonstrated an understanding of the instructions.   The patient was advised to call back or seek an in-person evaluation if the symptoms worsen or if the condition fails to improve as anticipated.  I provided 60 minutes of non-face-to-face time during this encounter.  Mackenzie Gibson  Participation Level: Active  Behavioral Response: Casual Alert Negative and Anxious   Type of Therapy: Individual Therapy  Treatment Goals addressed:   LTG: Develop and implement effective coping skills to carry out normal responsibilities and participate constructively in relationships as evidenced by self-report of increased satisfaction  STG: Mackenzie Gibson will verbalize an increased sense of mastery over PTSD symptoms by using several techniques to cope with flashbacks, decrease the power of triggers, and decrease negative thinking STG: Learn breathing techniques and grounding techniques at an age-appropriate level and demonstrate mastery in session then report independent use of these skills out of session. LTG: Explore personal core beliefs, rules and assumptions, and cognitive distortions through therapist using Cognitive Behavioral Therapy; learn how to develop replacement thoughts and challenge  unhelpful thoughts.  LTG: Score less than 9 on the PHQ-9 and less than 5 on the GAD-7 as evidenced by intermittent administration of the questionnaires to determine progress in managing depression and anxiety. (Anxiety) LTG: Learn and practice communication techniques such as active listening, I statements, open-ended questions, reflective listening, assertiveness, fair fighting rules, initiating conversations, and more as necessary and taught in session. STG: Learn emotion regulation strategies, distress tolerance skills, interpersonal effectiveness techniques, and mindfulness practices and use them in session and in life situations to improve results and satisfaction.  STG: Process life events to the extent needed so that will be able to move forward with various areas of life in a better frame of mind per self-report.    LTG: Learn about boundary styles and types, how to implement them, and how to enforce them so that feels more empowered and content with being able to maintain more helpful, appropriate boundaries in the future for a more balanced result.   LTG: Work to Arts development officer from models like CBT, Stages of Change, DBT, shame resilience theory, ACT, SFBT, MI, trauma-informed therapy and others to be able to manage mental health symptoms, AEB practicing out of session and reporting back.   ProgressTowards Goals: Progressing  Interventions: Solution Focused and Supportive   Summary: Mackenzie Gibson is a 38 y.o. female who presents with PTSD and GAD. She presented oriented x5 and stated she was feeling tired and it's been really stressful.  CSW evaluated patient's medication compliance, use of coping tools, and self-care, as applicable.  She provided an update on various aspects of her life that are normally discussed in therapy, including ongoing frustrations with fiance who has now quit his job without obtaining other one, her own poor decisions about spending money that  have put  them behind on the rent, and hearing from the father of her 3 youngest children who was demanding to talk to them.  She was very angry at her fiance and disclosed that they argued a couple of days, but she did not seem any closer to asking him to leave.  She said again that she has 4 children to raise in the house, not 3 children and 1 partner.  When CSW asked if she sees this as a pattern, she identified herself as being drawn for some reason to mama's boys.  She wanted to create memories with her children so she took them to the fair and when she was not allowed to ride the rides because of her size, they turned to playing games instead, which ended up cost her most of the rent money.  She had not thought it was such a problem, because her fiance had a paycheck coming, but then he quit the job. We talked about how she is going to manage to get the rent money together and that since this has already happened, it would not be beneficial to beat herself up.  She can perhaps focus on the memories created that day with her children.  Her ex got her telephone number and has now been calling to talk to the 3 children.  None of the wants to talk to him and has made that clear to him, but she gives them the option each time.  She went through the history of him cheating on her before and after the different pregnancies and births, even getting married and then divorced at one point during a separation they had.  She also disclosed that she had a miscarriage while with him.  Because he is demanding to talk to the children and claiming that the court order says he has the right, she plans to go to the courthouse to find out for sure.  He was recorded talking with their oldest almost-14yo son saying that he could do anything he wanted to his children since he was not convicted in court of abusing them, that it was discipline.  Her son told him that putting hands around your child's throat is not discipline and hung up on  him.  She has some work to do and then some thinking.  She felt better after getting out her feelings.  She received positive strokes for being agreeable and noncombative while she is researching facts.  Suicidal/Homicidal: No without intent/plan  Therapist Response:   Patient is progressing AEB engaging in scheduled therapy session.  Throughout the session, CSW gave patient the opportunity to explore thoughts and feelings associated with current life situations and past/present stressors.   CSW challenged patient gently and appropriately to consider different ways of looking at reported issues. CSW encouraged patient's expression of feelings and validated these using empathy, active listening, open body language, and unconditional positive regard.   Additional appointments were scheduled.      Plan/Recommendations:  Return to therapy in 3 weeks to next scheduled appointment on 10/17, reflect on what was discussed in session, engage in self care behaviors as explored in session, do homework as assigned (continue to think about the patterns she notices in her romantic relationships), and return to next session prepared to talk about experience with new coping methods.   Diagnosis:  PTSD (post-traumatic stress disorder)  GAD (generalized anxiety disorder)  Collaboration of Care: Psychiatrist AEB - patient continues to not want medication at this time -  states she is happier when she is on medicine but does not feel it is necessary currently  Patient/Guardian was advised Release of Information must be obtained prior to any record release in order to collaborate their care with an outside provider. Patient/Guardian was advised if they have not already done so to contact the registration department to sign all necessary forms in order for us  to release information regarding their care.   Consent: Patient/Guardian gives verbal consent for treatment and assignment of benefits for services provided during  this visit. Patient/Guardian expressed understanding and agreed to proceed.   Mackenzie Gibson 12/05/2022

## 2023-12-19 ENCOUNTER — Ambulatory Visit (INDEPENDENT_AMBULATORY_CARE_PROVIDER_SITE_OTHER): Admitting: Clinical

## 2023-12-19 DIAGNOSIS — F4310 Post-traumatic stress disorder, unspecified: Secondary | ICD-10-CM | POA: Diagnosis not present

## 2023-12-19 DIAGNOSIS — F411 Generalized anxiety disorder: Secondary | ICD-10-CM | POA: Diagnosis not present

## 2023-12-19 NOTE — Progress Notes (Unsigned)
 THERAPIST PROGRESS NOTE  Session Time: 8:05-9:05am  Session #19  Virtual Visit via Video Note  I connected with Mackenzie Gibson on 12/05/22 at 11:00 AM EDT by a video enabled telemedicine application and verified that I am speaking with the correct person using two identifiers.  Location: Patient: home Provider: home office   I discussed the limitations of evaluation and management by telemedicine and the availability of in person appointments. The patient expressed understanding and agreed to proceed.  I discussed the assessment and treatment plan with the patient. The patient was provided an opportunity to ask questions and all were answered. The patient agreed with the plan and demonstrated an understanding of the instructions.   The patient was advised to call back or seek an in-person evaluation if the symptoms worsen or if the condition fails to improve as anticipated.  I provided 60 minutes of non-face-to-face time during this encounter.  Mackenzie JINNY Crest, LCSW  Participation Level: Active  Behavioral Response: Casual Alert Negative and Anxious   Type of Therapy: Individual Therapy  Treatment Goals addressed:   LTG: Develop and implement effective coping skills to carry out normal responsibilities and participate constructively in relationships as evidenced by self-report of increased satisfaction  STG: Tsering Leaman will verbalize an increased sense of mastery over PTSD symptoms by using several techniques to cope with flashbacks, decrease the power of triggers, and decrease negative thinking STG: Learn breathing techniques and grounding techniques at an age-appropriate level and demonstrate mastery in session then report independent use of these skills out of session. LTG: Explore personal core beliefs, rules and assumptions, and cognitive distortions through therapist using Cognitive Behavioral Therapy; learn how to develop replacement thoughts and challenge  unhelpful thoughts.  LTG: Score less than 9 on the PHQ-9 and less than 5 on the GAD-7 as evidenced by intermittent administration of the questionnaires to determine progress in managing depression and anxiety. (Anxiety) LTG: Learn and practice communication techniques such as active listening, I statements, open-ended questions, reflective listening, assertiveness, fair fighting rules, initiating conversations, and more as necessary and taught in session. STG: Learn emotion regulation strategies, distress tolerance skills, interpersonal effectiveness techniques, and mindfulness practices and use them in session and in life situations to improve results and satisfaction.  STG: Process life events to the extent needed so that will be able to move forward with various areas of life in a better frame of mind per self-report.    LTG: Learn about boundary styles and types, how to implement them, and how to enforce them so that feels more empowered and content with being able to maintain more helpful, appropriate boundaries in the future for a more balanced result.   LTG: Work to Arts development officer from models like CBT, Stages of Change, DBT, shame resilience theory, ACT, SFBT, MI, trauma-informed therapy and others to be able to manage mental health symptoms, AEB practicing out of session and reporting back.   ProgressTowards Goals: Progressing  Interventions: Solution Focused and Supportive   Summary: Mackenzie Gibson is a 38 y.o. female who presents with PTSD and GAD. She presented oriented x5 and stated she was feeling tired and it's been really stressful.  CSW evaluated patient's medication compliance, use of coping tools, and self-care, as applicable.  She provided an update on various aspects of her life that are normally discussed in therapy, including ongoing frustrations with fiance who has now quit his job without obtaining other one, her own poor decisions about spending money that have  put  them behind on the rent, and hearing from the father of her 3 youngest children who was demanding to talk to them.  She was very angry at her fiance and disclosed that they argued a couple of days, but she did not seem any closer to asking him to leave.  She said again that she has 4 children to raise in the house, not 3 children and 1 partner.  When CSW asked if she sees this as a pattern, she identified herself as being drawn for some reason to mama's boys.  She wanted to create memories with her children so she took them to the fair and when she was not allowed to ride the rides because of her size, they turned to playing games instead, which ended up cost her most of the rent money.  She had not thought it was such a problem, because her fiance had a paycheck coming, but then he quit the job. We talked about how she is going to manage to get the rent money together and that since this has already happened, it would not be beneficial to beat herself up.  She can perhaps focus on the memories created that day with her children.  Her ex got her telephone number and has now been calling to talk to the 3 children.  None of the wants to talk to him and has made that clear to him, but she gives them the option each time.  She went through the history of him cheating on her before and after the different pregnancies and births, even getting married and then divorced at one point during a separation they had.  She also disclosed that she had a miscarriage while with him.  Because he is demanding to talk to the children and claiming that the court order says he has the right, she plans to go to the courthouse to find out for sure.  He was recorded talking with their oldest almost-14yo son saying that he could do anything he wanted to his children since he was not convicted in court of abusing them, that it was discipline.  Her son told him that putting hands around your child's throat is not discipline and hung up on  him.  She has some work to do and then some thinking.  She felt better after getting out her feelings.  She received positive strokes for being agreeable and noncombative while she is researching facts.  Suicidal/Homicidal: No without intent/plan  Therapist Response:   Patient is progressing AEB engaging in scheduled therapy session.  Throughout the session, CSW gave patient the opportunity to explore thoughts and feelings associated with current life situations and past/present stressors.   CSW challenged patient gently and appropriately to consider different ways of looking at reported issues. CSW encouraged patient's expression of feelings and validated these using empathy, active listening, open body language, and unconditional positive regard.   Additional appointments were scheduled.      Plan/Recommendations:  Return to therapy in 3 weeks to next scheduled appointment on 10/17, reflect on what was discussed in session, engage in self care behaviors as explored in session, do homework as assigned (continue to think about the patterns she notices in her romantic relationships), and return to next session prepared to talk about experience with new coping methods.   Diagnosis:  No diagnosis found.  Collaboration of Care: Psychiatrist AEB - patient continues to not want medication at this time - states she is happier when she  is on medicine but does not feel it is necessary currently  Patient/Guardian was advised Release of Information must be obtained prior to any record release in order to collaborate their care with an outside provider. Patient/Guardian was advised if they have not already done so to contact the registration department to sign all necessary forms in order for us  to release information regarding their care.   Consent: Patient/Guardian gives verbal consent for treatment and assignment of benefits for services provided during this visit. Patient/Guardian expressed understanding and  agreed to proceed.   Mackenzie JINNY Crest, LCSW 12/05/2022

## 2023-12-21 ENCOUNTER — Encounter (HOSPITAL_COMMUNITY): Payer: Self-pay | Admitting: Clinical

## 2024-01-13 ENCOUNTER — Encounter (HOSPITAL_COMMUNITY): Payer: Self-pay

## 2024-01-13 ENCOUNTER — Ambulatory Visit (INDEPENDENT_AMBULATORY_CARE_PROVIDER_SITE_OTHER): Admitting: Clinical

## 2024-01-13 DIAGNOSIS — Z91199 Patient's noncompliance with other medical treatment and regimen due to unspecified reason: Secondary | ICD-10-CM

## 2024-01-13 NOTE — Progress Notes (Signed)
 Mackenzie Gibson    CSW attempted to connect with patient for scheduled appointment via MyChart video text request x 2 and email request with no response; also attempted to connect via phone without success. CSW left message for patient to call office to reschedule therapy appointment.        Attempt 1: Text and email: 8:06am      Attempt 2: Text: 8:09am       Attempt 3: Phone call and HIPAA-compliant voicemail: 8:11am - on message patient was warned about her number of no shows and informed about 2 upcoming appointments      Left video chat open until:  8:27am      Per [facility] policy, after multiple attempts to reach patient unsuccessfully at appointed time, visit will be coded as a no show.      Encounter Diagnosis  Name Primary?   No-show for appointment Yes       Elgie Crest, LCSW 01/13/2024, 8:27 AM

## 2024-02-03 ENCOUNTER — Ambulatory Visit (HOSPITAL_COMMUNITY): Admitting: Clinical

## 2024-02-03 DIAGNOSIS — Z91198 Patient's noncompliance with other medical treatment and regimen for other reason: Secondary | ICD-10-CM

## 2024-02-03 NOTE — Progress Notes (Signed)
 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  She was reminded of the next appointment which she said she can attend because her shifts will not start until 12:00pm.  Encounter Diagnosis  Name Primary?   Failure to attend appointment with reason given Yes     Elgie Crest, LCSW 02/03/2024, 9:51 AM

## 2024-02-10 ENCOUNTER — Ambulatory Visit (HOSPITAL_COMMUNITY): Admitting: Clinical

## 2024-02-11 DIAGNOSIS — S025XXA Fracture of tooth (traumatic), initial encounter for closed fracture: Secondary | ICD-10-CM | POA: Diagnosis not present

## 2024-02-11 DIAGNOSIS — R030 Elevated blood-pressure reading, without diagnosis of hypertension: Secondary | ICD-10-CM | POA: Diagnosis not present

## 2024-02-11 DIAGNOSIS — K047 Periapical abscess without sinus: Secondary | ICD-10-CM | POA: Diagnosis not present

## 2024-02-11 DIAGNOSIS — K0889 Other specified disorders of teeth and supporting structures: Secondary | ICD-10-CM | POA: Diagnosis not present

## 2024-02-12 DIAGNOSIS — K029 Dental caries, unspecified: Secondary | ICD-10-CM | POA: Diagnosis not present

## 2024-02-12 DIAGNOSIS — K0889 Other specified disorders of teeth and supporting structures: Secondary | ICD-10-CM | POA: Diagnosis not present

## 2024-02-20 ENCOUNTER — Encounter (HOSPITAL_COMMUNITY): Payer: Self-pay | Admitting: Clinical

## 2024-02-20 ENCOUNTER — Ambulatory Visit (HOSPITAL_COMMUNITY): Admitting: Clinical

## 2024-02-20 DIAGNOSIS — F411 Generalized anxiety disorder: Secondary | ICD-10-CM

## 2024-02-20 DIAGNOSIS — F4310 Post-traumatic stress disorder, unspecified: Secondary | ICD-10-CM | POA: Diagnosis not present

## 2024-02-20 NOTE — Progress Notes (Signed)
 THERAPIST PROGRESS NOTE  Session Time: 9:00am-9:58am  Session #21  Virtual Visit via Video Note  I connected with Mackenzie Gibson on 12/05/22 at  9:00 AM EST by a video enabled telemedicine application and verified that I am speaking with the correct person using two identifiers.  Location: Patient:  car in parking lot at work Provider: home office   I discussed the limitations of evaluation and management by telemedicine and the availability of in person appointments. The patient expressed understanding and agreed to proceed.  I discussed the assessment and treatment plan with the patient. The patient was provided an opportunity to ask questions and all were answered. The patient agreed with the plan and demonstrated an understanding of the instructions.   The patient was advised to call back or seek an in-person evaluation if the symptoms worsen or if the condition fails to improve as anticipated.  I provided 58 minutes of non-face-to-face time during this encounter.  Elgie JINNY Crest, LCSW  Participation Level: Active  Behavioral Response: Casual Alert Euthymic   Type of Therapy: Individual Therapy  Treatment Goals addressed:   LTG: Develop and implement effective coping skills to carry out normal responsibilities and participate constructively in relationships as evidenced by self-report of increased satisfaction  STG: Mackenzie Gibson will verbalize an increased sense of mastery over PTSD symptoms by using several techniques to cope with flashbacks, decrease the power of triggers, and decrease negative thinking STG: Learn breathing techniques and grounding techniques at an age-appropriate level and demonstrate mastery in session then report independent use of these skills out of session. LTG: Explore personal core beliefs, rules and assumptions, and cognitive distortions through therapist using Cognitive Behavioral Therapy; learn how to develop replacement thoughts and  challenge unhelpful thoughts.  LTG: Score less than 9 on the PHQ-9 and less than 5 on the GAD-7 as evidenced by intermittent administration of the questionnaires to determine progress in managing depression and anxiety.  LTG: Learn and practice communication techniques such as active listening, I statements, open-ended questions, reflective listening, assertiveness, fair fighting rules, initiating conversations, and more as necessary and taught in session. STG: Learn emotion regulation strategies, distress tolerance skills, interpersonal effectiveness techniques, and mindfulness practices and use them in session and in life situations to improve results and satisfaction.  STG: Process life events to the extent needed so that will be able to move forward with various areas of life in a better frame of mind per self-report.    LTG: Learn about boundary styles and types, how to implement them, and how to enforce them so that feels more empowered and content with being able to maintain more helpful, appropriate boundaries in the future for a more balanced result.   LTG: Work to arts development officer from models like CBT, Stages of Change, DBT, shame resilience theory, ACT, SFBT, MI, trauma-informed therapy and others to be able to manage mental health symptoms, AEB practicing out of session and reporting back.   ProgressTowards Goals: Progressing  Interventions: Solution Focused and Supportive   Summary: Mackenzie Gibson is a 38 y.o. female who presents with PTSD and GAD. She presented oriented x5 and stated she was feeling a lot better.  CSW evaluated patient's medication compliance, use of coping tools, and self-care, as applicable.  She provided an update on various aspects of her life that are normally discussed in therapy, including how she got fired from her previous job, cleaning the house between employment, needing emergency surgery to extract an absessed tooth,  run-ins with her fiance during  which she put her foot down about him keeping a job or leaving the home, legal problems her son is now facing because of perceived threats at school, obtaining her new job, liking her new job and getting lots of hours, car problems, getting an advance on her tax return so she could buy Christmas for the kids, and working with her children's father to provide for the kids even though he still gives her PTSD reactions.  She has been engaging in more self-care lately and was applauded for this by CSW.  Other positive attitudes were pointed out as well, including her engagement in the job and her love for her children.  Her resilience in the face of many hard life challenges was explored and CSW encouraged her to continue the self-care activities, as they seem to be directly related to her ability to handle difficulties when they arise. She continues to have her fiance in the home but has great hesitancy about remarrying, saying it would negatively impact her insurance, her food stamps, and more, not to mention the fact that she feels she has to parent her fiance in many ways, getting him to shower and clean his clothing.  She is excited at the fact that she is going to get a large tax return (minus what she took as an advance for Christmas) and believes she will be able to find a new home for herself and the children.  Throughout the session we explored different options of how to look at things happening in her life from different perspectives.  She was commended for having a positive outlook.  Suicidal/Homicidal: No without intent/plan  Therapist Response:   Patient is progressing AEB engaging in scheduled therapy session.  Throughout the session, CSW gave patient the opportunity to explore thoughts and feelings associated with current life situations and past/present stressors.   CSW challenged patient gently and appropriately to consider different ways of looking at reported issues. CSW encouraged patients  expression of feelings and validated these using empathy, active listening, open body language, and unconditional positive regard. One additional appointment was scheduled.      Plan/Recommendations:  Return to therapy at next scheduled appointment on 2/6, reflect on what was discussed in session, engage in self care behaviors as explored in session, do homework as assigned (continue with self-care activities fit in wherever possible), and return to next session prepared to talk about experience with new coping methods.   Diagnosis:  GAD (generalized anxiety disorder)  PTSD (post-traumatic stress disorder)  Collaboration of Care: Psychiatrist AEB - patient continues to not want medication at this time - states she is happier when she is on medicine but does not feel it is necessary currently  Patient/Guardian was advised Release of Information must be obtained prior to any record release in order to collaborate their care with an outside provider. Patient/Guardian was advised if they have not already done so to contact the registration department to sign all necessary forms in order for us  to release information regarding their care.   Consent: Patient/Guardian gives verbal consent for treatment and assignment of benefits for services provided during this visit. Patient/Guardian expressed understanding and agreed to proceed.   Elgie JINNY Crest, LCSW 12/05/2022

## 2024-03-09 ENCOUNTER — Ambulatory Visit: Admitting: Student

## 2024-03-09 ENCOUNTER — Ambulatory Visit
Admission: RE | Admit: 2024-03-09 | Discharge: 2024-03-09 | Disposition: A | Discharge status: Home / Self Care | Source: Ambulatory Visit

## 2024-03-09 VITALS — BP 146/85 | HR 85 | Ht 68.0 in | Wt 303.4 lb

## 2024-03-09 DIAGNOSIS — M25562 Pain in left knee: Secondary | ICD-10-CM

## 2024-03-09 DIAGNOSIS — R030 Elevated blood-pressure reading, without diagnosis of hypertension: Secondary | ICD-10-CM

## 2024-03-09 MED ORDER — MELOXICAM 15 MG PO TABS: 15.0000 mg | ORAL_TABLET | Freq: Every day | ORAL | 0 refills | Status: AC

## 2024-03-09 NOTE — Progress Notes (Addendum)
" ° ° °  SUBJECTIVE:   CHIEF COMPLAINT / HPI:   Left knee pain - Acute right knee pain, occurred 2 days ago - Occurred while working, she works in [employer] - History of osteoarthritis in right knee, prior steroid injections - No surgical history on his knee - Reports she feels knee will give out, but has not buckled under her weight - Denies clicking, popping sensations - No bruising, but reports anterior knee swelling - Pain primarily in anterior medial knee  OBJECTIVE:   BP (!) 146/85   Pulse 85   Ht 5' 8 (1.727 m)   Wt (!) 303 lb 6.4 oz (137.6 kg)   LMP 09/29/2019   SpO2 100%   BMI 46.13 kg/m    General: NAD, pleasant Left knee: No gross deformity, no ecchymosis, small effusion present.  TTP over anterior medial compartment.  Mild tenderness to palpation of lateral joint line.  5/5 strength, normal sensation.  No laxity on ligamental stress testing.  FADIR/FABER negative.  McMurray negative, Thessaly negative.  ASSESSMENT/PLAN:   Assessment & Plan Acute pain of left knee Differential: Osteoarthritis with small effusion, patellofemoral pain syndrome.  Lower concern for meniscal injury, ligamental injury based on exam. - Obtain plain films - Meloxicam  15 mg daily 7-day course - Recommend unloader/functional knee brace, nonimmobilizing - Sports medicine referral now, per patient preference Elevated blood pressure reading without diagnosis of hypertension -Discussed with patient, recommend she follow-up with her PCP for discussion of high blood pressure.  Gladis Church, DO [facility]  "

## 2024-03-09 NOTE — Patient Instructions (Addendum)
 It was great to see you! Thank you for allowing me to participate in your care!   I recommend that you always bring your medications to each appointment as this makes it easy to ensure we are on the correct medications and helps us  not miss when refills are needed.  Our plans for today:  - I recommend 1 of 2 knee braces, whichever one is more comfortable for you.  The first is a unloader brace (typically used for osteoarthritis), alternatively if you can use a functional brace.  You can find all these online or in stores.  Do not use immobilizers, as these are rigid and not what you are looking for. - Take 15 mg meloxicam  once daily for 7 days - I have sent a referral to sports medicine, expect a call in the next 1 to 2 weeks to schedule appointment  An x-ray was ordered for you---you do not need an appointment to have this completed.  I recommend going to [facility] [address] [place]  If the results are normal,I will send you a letter  I will call you with results if anything is abnormal     Take care and seek immediate care sooner if you develop any concerns. Please remember to show up 15 minutes before your scheduled appointment time!  Gladis Church, DO [facility]

## 2024-03-15 ENCOUNTER — Ambulatory Visit: Payer: Self-pay | Admitting: Student

## 2024-03-19 ENCOUNTER — Ambulatory Visit (INDEPENDENT_AMBULATORY_CARE_PROVIDER_SITE_OTHER): Admitting: Clinical

## 2024-03-19 DIAGNOSIS — F411 Generalized anxiety disorder: Secondary | ICD-10-CM

## 2024-03-19 DIAGNOSIS — F4310 Post-traumatic stress disorder, unspecified: Secondary | ICD-10-CM

## 2024-03-19 NOTE — Progress Notes (Unsigned)
 THERAPIST PROGRESS NOTE  Session Time: 9:00am-9:58am  Session #21  Virtual Visit via Video Note  I connected with Mackenzie Gibson on 12/05/22 at  8:00 AM EST by a video enabled telemedicine application and verified that I am speaking with the correct person using two identifiers.  Location: Patient:  car in parking lot at work Provider: home office   I discussed the limitations of evaluation and management by telemedicine and the availability of in person appointments. The patient expressed understanding and agreed to proceed.  I discussed the assessment and treatment plan with the patient. The patient was provided an opportunity to ask questions and all were answered. The patient agreed with the plan and demonstrated an understanding of the instructions.   The patient was advised to call back or seek an in-person evaluation if the symptoms worsen or if the condition fails to improve as anticipated.  I provided 58 minutes of non-face-to-face time during this encounter.  Elgie JINNY Crest, LCSW  Participation Level: Active  Behavioral Response: Casual Alert Euthymic   Type of Therapy: Individual Therapy  Treatment Goals addressed:   LTG: Develop and implement effective coping skills to carry out normal responsibilities and participate constructively in relationships as evidenced by self-report of increased satisfaction  STG: Kattie Santoyo will verbalize an increased sense of mastery over PTSD symptoms by using several techniques to cope with flashbacks, decrease the power of triggers, and decrease negative thinking STG: Learn breathing techniques and grounding techniques at an age-appropriate level and demonstrate mastery in session then report independent use of these skills out of session. LTG: Explore personal core beliefs, rules and assumptions, and cognitive distortions through therapist using Cognitive Behavioral Therapy; learn how to develop replacement thoughts and  challenge unhelpful thoughts.  LTG: Score less than 9 on the PHQ-9 and less than 5 on the GAD-7 as evidenced by intermittent administration of the questionnaires to determine progress in managing depression and anxiety.  LTG: Learn and practice communication techniques such as active listening, I statements, open-ended questions, reflective listening, assertiveness, fair fighting rules, initiating conversations, and more as necessary and taught in session. STG: Learn emotion regulation strategies, distress tolerance skills, interpersonal effectiveness techniques, and mindfulness practices and use them in session and in life situations to improve results and satisfaction.  STG: Process life events to the extent needed so that will be able to move forward with various areas of life in a better frame of mind per self-report.    LTG: Learn about boundary styles and types, how to implement them, and how to enforce them so that feels more empowered and content with being able to maintain more helpful, appropriate boundaries in the future for a more balanced result.   LTG: Work to arts development officer from models like CBT, Stages of Change, DBT, shame resilience theory, ACT, SFBT, MI, trauma-informed therapy and others to be able to manage mental health symptoms, AEB practicing out of session and reporting back.   ProgressTowards Goals: Progressing  Interventions: Solution Focused and Supportive   Summary: Mackenzie Gibson is a 38 y.o. female who presents with PTSD and GAD. She presented oriented x5 and stated she was feeling a lot better.  CSW evaluated patient's medication compliance, use of coping tools, and self-care, as applicable.  She provided an update on various aspects of her life that are normally discussed in therapy, including how she got fired from her previous job, cleaning the house between employment, needing emergency surgery to extract an absessed tooth,  run-ins with her fiance during  which she put her foot down about him keeping a job or leaving the home, legal problems her son is now facing because of perceived threats at school, obtaining her new job, liking her new job and getting lots of hours, car problems, getting an advance on her tax return so she could buy Christmas for the kids, and working with her children's father to provide for the kids even though he still gives her PTSD reactions.  She has been engaging in more self-care lately and was applauded for this by CSW.  Other positive attitudes were pointed out as well, including her engagement in the job and her love for her children.  Her resilience in the face of many hard life challenges was explored and CSW encouraged her to continue the self-care activities, as they seem to be directly related to her ability to handle difficulties when they arise. She continues to have her fiance in the home but has great hesitancy about remarrying, saying it would negatively impact her insurance, her food stamps, and more, not to mention the fact that she feels she has to parent her fiance in many ways, getting him to shower and clean his clothing.  She is excited at the fact that she is going to get a large tax return (minus what she took as an advance for Christmas) and believes she will be able to find a new home for herself and the children.  Throughout the session we explored different options of how to look at things happening in her life from different perspectives.  She was commended for having a positive outlook.  Suicidal/Homicidal: No without intent/plan  Therapist Response:   Patient is progressing AEB engaging in scheduled therapy session.  Throughout the session, CSW gave patient the opportunity to explore thoughts and feelings associated with current life situations and past/present stressors.   CSW challenged patient gently and appropriately to consider different ways of looking at reported issues. CSW encouraged patients  expression of feelings and validated these using empathy, active listening, open body language, and unconditional positive regard. One additional appointment was scheduled.      Plan/Recommendations:  Return to therapy at next scheduled appointment on 2/6, reflect on what was discussed in session, engage in self care behaviors as explored in session, do homework as assigned (continue with self-care activities fit in wherever possible), and return to next session prepared to talk about experience with new coping methods.   Diagnosis:  No diagnosis found.  Collaboration of Care: Psychiatrist AEB - patient continues to not want medication at this time - states she is happier when she is on medicine but does not feel it is necessary currently  Patient/Guardian was advised Release of Information must be obtained prior to any record release in order to collaborate their care with an outside provider. Patient/Guardian was advised if they have not already done so to contact the registration department to sign all necessary forms in order for us  to release information regarding their care.   Consent: Patient/Guardian gives verbal consent for treatment and assignment of benefits for services provided during this visit. Patient/Guardian expressed understanding and agreed to proceed.   Elgie JINNY Crest, LCSW 12/05/2022

## 2024-03-21 ENCOUNTER — Encounter (HOSPITAL_COMMUNITY): Payer: Self-pay | Admitting: Clinical

## 2024-03-23 ENCOUNTER — Encounter (HOSPITAL_COMMUNITY): Payer: Self-pay | Admitting: Clinical

## 2024-03-23 ENCOUNTER — Ambulatory Visit (INDEPENDENT_AMBULATORY_CARE_PROVIDER_SITE_OTHER): Admitting: Clinical

## 2024-03-23 DIAGNOSIS — F411 Generalized anxiety disorder: Secondary | ICD-10-CM

## 2024-03-23 DIAGNOSIS — F4310 Post-traumatic stress disorder, unspecified: Secondary | ICD-10-CM | POA: Diagnosis not present

## 2024-03-23 NOTE — Progress Notes (Signed)
 THERAPIST PROGRESS NOTE  Session Time: 9:05am-9:23am  Session #23  Virtual Visit via Video Note  I connected with Mackenzie Gibson on 12/05/22 at  9:00 AM EST by a video enabled telemedicine application and verified that I am speaking with the correct person using two identifiers.  Location: Patient:  car, gas station Provider: home office   I discussed the limitations of evaluation and management by telemedicine and the availability of in person appointments. The patient expressed understanding and agreed to proceed.  I discussed the assessment and treatment plan with the patient. The patient was provided an opportunity to ask questions and all were answered. The patient agreed with the plan and demonstrated an understanding of the instructions.   The patient was advised to call back or seek an in-person evaluation if the symptoms worsen or if the condition fails to improve as anticipated.  I provided 18 minutes of non-face-to-face time during this encounter.  Elgie JINNY Crest, LCSW  Participation Level: Active  Behavioral Response: Casual Alert Euthymic   Type of Therapy: Individual Therapy  Treatment Goals addressed:   LTG: Develop and implement effective coping skills to carry out normal responsibilities and participate constructively in relationships as evidenced by self-report of increased satisfaction  STG: Ozzie Remmers will verbalize an increased sense of mastery over PTSD symptoms by using several techniques to cope with flashbacks, decrease the power of triggers, and decrease negative thinking STG: Learn breathing techniques and grounding techniques at an age-appropriate level and demonstrate mastery in session then report independent use of these skills out of session. LTG: Explore personal core beliefs, rules and assumptions, and cognitive distortions through therapist using Cognitive Behavioral Therapy; learn how to develop replacement thoughts and challenge  unhelpful thoughts.  LTG: Score less than 9 on the PHQ-9 and less than 5 on the GAD-7 as evidenced by intermittent administration of the questionnaires to determine progress in managing depression and anxiety.  LTG: Learn and practice communication techniques such as active listening, I statements, open-ended questions, reflective listening, assertiveness, fair fighting rules, initiating conversations, and more as necessary and taught in session. STG: Learn emotion regulation strategies, distress tolerance skills, interpersonal effectiveness techniques, and mindfulness practices and use them in session and in life situations to improve results and satisfaction.  STG: Process life events to the extent needed so that will be able to move forward with various areas of life in a better frame of mind per self-report.    LTG: Learn about boundary styles and types, how to implement them, and how to enforce them so that feels more empowered and content with being able to maintain more helpful, appropriate boundaries in the future for a more balanced result.   LTG: Work to arts development officer from models like CBT, Stages of Change, DBT, shame resilience theory, ACT, SFBT, MI, trauma-informed therapy and others to be able to manage mental health symptoms, AEB practicing out of session and reporting back.   ProgressTowards Goals: Progressing  Interventions: Supportive   Summary: Mackenzie Gibson is a 39 y.o. female who presents with PTSD and GAD. CSW connected with patient on the morning she was preparing to go to court with her 22 year old son for his first appearance related to a charge of making threats of mass destruction on an educational institution. Patient reported she previously took her son to his probation officer appointment, where they were told by the P.O. that he got lucky because the incident occurred while he is a minor. She stated her  son will be on probation for now and that, if found  guilty, it would become a higher level of probation; however, his juvenile record will be sealed and should not impact future adult employment. Patient shared that one condition of probation is that her son cannot use THC, and she admitted casually that this is something he does with her knowledge.  Patient provided an update on her relationship with her fianc, speaking freely and negatively about him in front of her son. She expressed frustration that her fianc will not complete even small household tasks, though she acknowledged she has not had a direct conversation with him about possibly moving out. Patient stated she did not mind discussing these issues in front of her son, noting that he sees me explode at him anyway and that the children dont like him. CSW remained cautious about discussing adult relationship issues in front of the minor.  Patient reported hopefulness that her fianc will secure a security job once he completes a drug test and noted surprise that he plans to keep his [employer] delivery job on weekends. She shared positive developments, including receiving her tax refund, catching up on bills, identifying two houses to look at, treating her daughters to [employer], and taking all of her children to a trampoline park. Patient stated she even jumped on a trampoline herself, felt sore afterward, but found the experience enjoyable and de-stressing. She also reported engaging in self-care when the children were away and her fianc was working, having the house to herself over the weekend.  Patient stated that, per court requirements, her son must return to school until the homeschool situation is fully operational, which neither of them likes but understand they must comply with court orders. She reported that her son has been respectful throughout the legal process and expressed significant pride in him. Patient stated she gets upset at the smallest things.  Suicidal/Homicidal: No  without intent/plan  Therapist Response:  Patient presented as stressed yet engaged, with heightened situational anxiety related to her sons legal involvement and ongoing relationship dissatisfaction. She demonstrated limited insight into appropriate boundaries regarding discussing adult issues in front of her children but showed strengths in advocacy for her son, compliance with court expectations, and ability to recognize and engage in positive coping strategies and self-care. Mood appeared labile but overall reality-based; no SI/HI expressed. Patient exhibits resilience and patience despite perceiving herself as easily upset.      Plan/Recommendations:  Return to therapy at next scheduled appointment on 2/6, reflect on what was discussed in session, engage in self care behaviors as explored in session.  CSW provided emotional support, normalization, and reframing, highlighting patients patience and resilience and noting the length of time she remains calm before becoming upset. CSW encouraged continued engagement in self-care activities whenever possible. CSW will remind patient in future sessions of the inappropriateness and potential impact of exposing children to adult relationship conflicts. Future sessions will continue to address stress management, parenting under legal stressors, boundary-setting, and communication strategies within her relationship.  Diagnosis:  PTSD (post-traumatic stress disorder)  GAD (generalized anxiety disorder)  Collaboration of Care: Psychiatrist AEB - patient continues to not want medication at this time - states she is happier when she is on medicine but does not feel it is necessary currently  Patient/Guardian was advised Release of Information must be obtained prior to any record release in order to collaborate their care with an outside provider. Patient/Guardian was advised if they have not already done  so to contact the registration department to sign all  necessary forms in order for us  to release information regarding their care.   Consent: Patient/Guardian gives verbal consent for treatment and assignment of benefits for services provided during this visit. Patient/Guardian expressed understanding and agreed to proceed.   Elgie JINNY Crest, LCSW 12/05/2022

## 2024-04-09 ENCOUNTER — Ambulatory Visit (HOSPITAL_COMMUNITY): Admitting: Clinical

## 2024-04-09 ENCOUNTER — Encounter (HOSPITAL_COMMUNITY): Payer: Self-pay | Admitting: Clinical

## 2024-04-09 DIAGNOSIS — F411 Generalized anxiety disorder: Secondary | ICD-10-CM

## 2024-04-09 DIAGNOSIS — F4310 Post-traumatic stress disorder, unspecified: Secondary | ICD-10-CM

## 2024-04-09 NOTE — Progress Notes (Signed)
 THERAPIST PROGRESS NOTE  Session Time: 9:02am-10:00am  Session #24  Virtual Visit via Video Note  I connected with Mackenzie Gibson on 04/09/2024 at  9:00 AM EST by a video enabled telemedicine application and verified that I am speaking with the correct person using two identifiers.  Location: Patient:  home Provider: home office   I discussed the limitations of evaluation and management by telemedicine and the availability of in person appointments. The patient expressed understanding and agreed to proceed.  I discussed the assessment and treatment plan with the patient. The patient was provided an opportunity to ask questions and all were answered. The patient agreed with the plan and demonstrated an understanding of the instructions.   The patient was advised to call back or seek an in-person evaluation if the symptoms worsen or if the condition fails to improve as anticipated.  I provided 58 minutes of non-face-to-face time during this encounter.  Mackenzie JINNY Crest, LCSW  Participation Level: Active  Behavioral Response: Casual Alert Euthymic   Type of Therapy: Individual Therapy  Treatment Goals addressed:   LTG: Develop and implement effective coping skills to carry out normal responsibilities and participate constructively in relationships as evidenced by self-report of increased satisfaction  STG: Mackenzie Gibson will verbalize an increased sense of mastery over PTSD symptoms by using several techniques to cope with flashbacks, decrease the power of triggers, and decrease negative thinking STG: Learn breathing techniques and grounding techniques at an age-appropriate level and demonstrate mastery in session then report independent use of these skills out of session. LTG: Explore personal core beliefs, rules and assumptions, and cognitive distortions through therapist using Cognitive Behavioral Therapy; learn how to develop replacement thoughts and challenge unhelpful  thoughts.  LTG: Score less than 9 on the PHQ-9 and less than 5 on the GAD-7 as evidenced by intermittent administration of the questionnaires to determine progress in managing depression and anxiety.  LTG: Learn and practice communication techniques such as active listening, I statements, open-ended questions, reflective listening, assertiveness, fair fighting rules, initiating conversations, and more as necessary and taught in session. STG: Learn emotion regulation strategies, distress tolerance skills, interpersonal effectiveness techniques, and mindfulness practices and use them in session and in life situations to improve results and satisfaction.  STG: Process life events to the extent needed so that will be able to move forward with various areas of life in a better frame of mind per self-report.    LTG: Learn about boundary styles and types, how to implement them, and how to enforce them so that feels more empowered and content with being able to maintain more helpful, appropriate boundaries in the future for a more balanced result.   LTG: Work to arts development officer from models like CBT, Stages of Change, DBT, shame resilience theory, ACT, SFBT, MI, trauma-informed therapy and others to be able to manage mental health symptoms, AEB practicing out of session and reporting back.   ProgressTowards Goals: Progressing  Interventions: Supportive, Reframing, and Other: values work   Summary: Mackenzie Gibson is a 39 y.o. female who presents with PTSD and GAD. Patient attended session via telehealth and remained seated upright in bed with her fianc present off screen throughout. She declined to discuss relationship concerns at this time, stating there is a lot and I cant now, and agreed to revisit the topic at the next scheduled session on 3/11. Session primarily focused on occupational functioning. Patient reported overall stability at her current job and shared that she can  realistically see  herself remaining in the position for 1-2 years, indicating increased sense of consistency and commitment compared to prior employment patterns.  She described becoming involved in workplace conflict and petty fighting, and reported she is currently under investigation related to workplace accusations. She stated she expects to be fully exonerated. CSW explored patterns of workplace drama and noted history of multiple job changes during the course of treatment, some related to interpersonal conflict. CSW emphasized patients stated value of caring for and providing for her family, highlighting that her current job best supports this goal.  CSW provided psychoeducation and coaching around boundary setting, including modeling specific language and differentiating between casual requests and clear, formal boundaries. Patient initially stated she had already set limits but was receptive to exploring how clarity, tone, and follow-through affect effectiveness. CSW reviewed four steps of boundary setting: identify the boundary, communicate it clearly, implement it, and enforce consequences if necessary. Discussed that enforcement may now be warranted and processed what enforcement could realistically look like in her work environment.  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 CSW reframed discussion toward long-term stability and alignment with her value of maintaining employment. Patient reflected on the phrase Time is a thief, expressing awareness of time passing and desire to use it meaningfully.  Suicidal/Homicidal: No without intent/plan  Therapist Response:  Patient demonstrates increased insight into values and long-term goals, with greater job stability than previously observed. Ongoing interpersonal conflict and boundary difficulties continue to contribute to workplace stress. Some rigidity and resentment  noted regarding perceived unfairness, though patient remains receptive to skills-based interventions. Avoidance of relationship topics suggests need for future processing. Overall functioning stable with moderate situational stress.      Plan/Recommendations:  Return to therapy at next scheduled appointment on 3/11, reflect on what was discussed in session, engage in self care behaviors as explored in session.  Continue CBT- and skills-based work focused on boundary setting, assertive communication, and conflict management. Reinforce alignment of decisions with core values (family stability, job retention). Revisit fianc/relationship concerns next session when patient feels able to engage. Monitor impact of workplace investigation and provide support as needed. Maintain scheduled session on 3/11.  Diagnosis:  PTSD (post-traumatic stress disorder)  GAD (generalized anxiety disorder)  Collaboration of Care: Psychiatrist AEB - patient continues to not want medication at this time - states she is happier when she is on medicine but does not feel it is necessary currently  Patient/Guardian was advised Release of Information must be obtained prior to any record release in order to collaborate their care with an outside provider. Patient/Guardian was advised if they have not already done so to contact the registration department to sign all necessary forms in order for us  to release information regarding their care.   Consent: Patient/Guardian gives verbal consent for treatment and assignment of benefits for services provided during this visit. Patient/Guardian expressed understanding and agreed to proceed.   Mackenzie JINNY Crest, LCSW 12/05/2022

## 2024-05-12 ENCOUNTER — Ambulatory Visit (HOSPITAL_COMMUNITY): Admitting: Clinical

## 2024-05-25 ENCOUNTER — Ambulatory Visit (HOSPITAL_COMMUNITY): Admitting: Clinical
